# Patient Record
Sex: Female | Born: 1950 | Race: White | Hispanic: No | Marital: Married | Smoking: Never smoker
Health system: Southern US, Community
[De-identification: ages and names within clinical notes are randomized; demographics above are authoritative.]

## PROBLEM LIST (undated history)

## (undated) DIAGNOSIS — Z8719 Personal history of other diseases of the digestive system: Secondary | ICD-10-CM

## (undated) DIAGNOSIS — D649 Anemia, unspecified: Secondary | ICD-10-CM

## (undated) DIAGNOSIS — S82892A Other fracture of left lower leg, initial encounter for closed fracture: Secondary | ICD-10-CM

## (undated) DIAGNOSIS — E213 Hyperparathyroidism, unspecified: Secondary | ICD-10-CM

## (undated) DIAGNOSIS — E785 Hyperlipidemia, unspecified: Secondary | ICD-10-CM

## (undated) DIAGNOSIS — K5730 Diverticulosis of large intestine without perforation or abscess without bleeding: Secondary | ICD-10-CM

## (undated) DIAGNOSIS — I639 Cerebral infarction, unspecified: Secondary | ICD-10-CM

## (undated) DIAGNOSIS — M797 Fibromyalgia: Secondary | ICD-10-CM

## (undated) DIAGNOSIS — K219 Gastro-esophageal reflux disease without esophagitis: Secondary | ICD-10-CM

## (undated) DIAGNOSIS — I10 Essential (primary) hypertension: Secondary | ICD-10-CM

## (undated) HISTORY — DX: Hyperlipidemia, unspecified: E78.5

## (undated) HISTORY — PX: CHOLECYSTECTOMY: SHX55

## (undated) HISTORY — DX: Essential (primary) hypertension: I10

## (undated) HISTORY — PX: UPPER GASTROINTESTINAL ENDOSCOPY: SHX188

## (undated) HISTORY — DX: Other fracture of left lower leg, initial encounter for closed fracture: S82.892A

## (undated) HISTORY — PX: COLONOSCOPY: SHX174

## (undated) HISTORY — PX: ANKLE SURGERY: SHX546

## (undated) HISTORY — DX: Diverticulosis of large intestine without perforation or abscess without bleeding: K57.30

---

## 1999-03-30 ENCOUNTER — Other Ambulatory Visit: Admission: RE | Admit: 1999-03-30 | Discharge: 1999-03-30 | Payer: Self-pay | Admitting: Family Medicine

## 2000-09-08 ENCOUNTER — Other Ambulatory Visit: Admission: RE | Admit: 2000-09-08 | Discharge: 2000-09-08 | Payer: Self-pay | Admitting: Family Medicine

## 2001-09-14 ENCOUNTER — Other Ambulatory Visit: Admission: RE | Admit: 2001-09-14 | Discharge: 2001-09-14 | Payer: Self-pay | Admitting: Family Medicine

## 2003-10-22 ENCOUNTER — Other Ambulatory Visit: Admission: RE | Admit: 2003-10-22 | Discharge: 2003-10-22 | Payer: Self-pay | Admitting: Family Medicine

## 2004-01-05 DIAGNOSIS — S82892A Other fracture of left lower leg, initial encounter for closed fracture: Secondary | ICD-10-CM

## 2004-01-05 HISTORY — DX: Other fracture of left lower leg, initial encounter for closed fracture: S82.892A

## 2004-07-20 ENCOUNTER — Encounter: Admission: RE | Admit: 2004-07-20 | Discharge: 2004-10-18 | Payer: Self-pay | Admitting: Orthopedic Surgery

## 2005-01-21 ENCOUNTER — Other Ambulatory Visit: Admission: RE | Admit: 2005-01-21 | Discharge: 2005-01-21 | Payer: Self-pay | Admitting: Family Medicine

## 2006-02-17 ENCOUNTER — Other Ambulatory Visit: Admission: RE | Admit: 2006-02-17 | Discharge: 2006-02-17 | Payer: Self-pay | Admitting: Family Medicine

## 2010-10-22 ENCOUNTER — Encounter (INDEPENDENT_AMBULATORY_CARE_PROVIDER_SITE_OTHER): Payer: Self-pay | Admitting: *Deleted

## 2010-11-05 ENCOUNTER — Ambulatory Visit (INDEPENDENT_AMBULATORY_CARE_PROVIDER_SITE_OTHER): Payer: Self-pay | Admitting: Internal Medicine

## 2012-03-30 ENCOUNTER — Encounter: Payer: Self-pay | Admitting: *Deleted

## 2012-04-04 ENCOUNTER — Other Ambulatory Visit: Payer: Self-pay

## 2012-04-04 MED ORDER — OMEPRAZOLE 40 MG PO CPDR: 40.0000 mg | DELAYED_RELEASE_CAPSULE | Freq: Every day | ORAL | Status: DC

## 2012-04-04 MED ORDER — CYCLOBENZAPRINE HCL 10 MG PO TABS: 10.0000 mg | ORAL_TABLET | Freq: Every day | ORAL | Status: DC

## 2012-05-02 ENCOUNTER — Other Ambulatory Visit: Payer: Self-pay

## 2012-05-02 MED ORDER — TRAMADOL HCL 50 MG PO TABS: 50.0000 mg | ORAL_TABLET | Freq: Two times a day (BID) | ORAL | Status: DC

## 2012-05-02 NOTE — Telephone Encounter (Signed)
Last seen 02/25/12  Last written 02/11/12    Print Rx and have nurse call patient to pick up

## 2012-06-07 ENCOUNTER — Other Ambulatory Visit: Payer: Self-pay | Admitting: Nurse Practitioner

## 2012-06-08 NOTE — Telephone Encounter (Signed)
Last seen 02/14, last filled 05/02/12, call pt to pick up

## 2012-06-08 NOTE — Telephone Encounter (Signed)
according to epic was done on 06/06/12- please check with pharmacy

## 2012-06-09 NOTE — Telephone Encounter (Signed)
Called pharmacy and rx was picked up 06/08/12

## 2012-06-21 ENCOUNTER — Other Ambulatory Visit: Payer: Self-pay | Admitting: *Deleted

## 2012-06-21 ENCOUNTER — Other Ambulatory Visit: Payer: Self-pay | Admitting: Nurse Practitioner

## 2012-06-21 MED ORDER — HYOSCYAMINE SULFATE 0.125 MG SL SUBL: 0.1250 mg | SUBLINGUAL_TABLET | SUBLINGUAL | Status: DC | PRN

## 2012-06-21 MED ORDER — HYOSCYAMINE SULFATE 0.125 MG PO TABS: 0.1250 mg | ORAL_TABLET | ORAL | Status: DC | PRN

## 2012-06-21 NOTE — Telephone Encounter (Signed)
Has appt with you on 07/21/12, last seen 02/25/12 and filled then by [facility]

## 2012-06-23 ENCOUNTER — Other Ambulatory Visit: Payer: Self-pay

## 2012-06-23 MED ORDER — FLUTICASONE PROPIONATE 50 MCG/ACT NA SUSP: 2.0000 | Freq: Every day | NASAL | Status: DC

## 2012-06-26 ENCOUNTER — Ambulatory Visit: Payer: Self-pay | Admitting: Nurse Practitioner

## 2012-07-01 ENCOUNTER — Other Ambulatory Visit: Payer: Self-pay | Admitting: Family Medicine

## 2012-07-03 ENCOUNTER — Other Ambulatory Visit: Payer: Self-pay

## 2012-07-03 MED ORDER — HYDROCHLOROTHIAZIDE 25 MG PO TABS: 25.0000 mg | ORAL_TABLET | Freq: Every day | ORAL | Status: DC

## 2012-07-03 NOTE — Telephone Encounter (Signed)
LAST OV 2/14.

## 2012-07-03 NOTE — Telephone Encounter (Signed)
Last seen 2/14 [facility]

## 2012-07-20 ENCOUNTER — Other Ambulatory Visit: Payer: Self-pay

## 2012-07-20 MED ORDER — FLUTICASONE PROPIONATE 50 MCG/ACT NA SUSP: 2.0000 | Freq: Every day | NASAL | Status: DC

## 2012-07-20 MED ORDER — ENALAPRIL MALEATE 20 MG PO TABS: 20.0000 mg | ORAL_TABLET | Freq: Every day | ORAL | Status: DC

## 2012-07-20 MED ORDER — OMEPRAZOLE 40 MG PO CPDR: 40.0000 mg | DELAYED_RELEASE_CAPSULE | Freq: Every day | ORAL | Status: DC

## 2012-07-21 ENCOUNTER — Ambulatory Visit (INDEPENDENT_AMBULATORY_CARE_PROVIDER_SITE_OTHER): Payer: Medicare Other | Admitting: Nurse Practitioner

## 2012-07-21 ENCOUNTER — Encounter: Payer: Self-pay | Admitting: Nurse Practitioner

## 2012-07-21 VITALS — BP 150/74 | HR 76 | Temp 97.0°F | Wt 132.5 lb

## 2012-07-21 DIAGNOSIS — Z09 Encounter for follow-up examination after completed treatment for conditions other than malignant neoplasm: Secondary | ICD-10-CM

## 2012-07-21 DIAGNOSIS — K589 Irritable bowel syndrome without diarrhea: Secondary | ICD-10-CM

## 2012-07-21 DIAGNOSIS — I10 Essential (primary) hypertension: Secondary | ICD-10-CM | POA: Insufficient documentation

## 2012-07-21 DIAGNOSIS — K219 Gastro-esophageal reflux disease without esophagitis: Secondary | ICD-10-CM

## 2012-07-21 DIAGNOSIS — F411 Generalized anxiety disorder: Secondary | ICD-10-CM | POA: Insufficient documentation

## 2012-07-21 DIAGNOSIS — E785 Hyperlipidemia, unspecified: Secondary | ICD-10-CM

## 2012-07-21 DIAGNOSIS — M5137 Other intervertebral disc degeneration, lumbosacral region: Secondary | ICD-10-CM

## 2012-07-21 DIAGNOSIS — J309 Allergic rhinitis, unspecified: Secondary | ICD-10-CM

## 2012-07-21 DIAGNOSIS — M5136 Other intervertebral disc degeneration, lumbar region: Secondary | ICD-10-CM | POA: Insufficient documentation

## 2012-07-21 LAB — COMPLETE METABOLIC PANEL WITH GFR
ALT: 17 U/L (ref 0–35)
AST: 18 U/L (ref 0–37)
CO2: 31 mEq/L (ref 19–32)
Calcium: 10.7 mg/dL — ABNORMAL HIGH (ref 8.4–10.5)
Chloride: 102 mEq/L (ref 96–112)
Creat: 0.74 mg/dL (ref 0.50–1.10)
GFR, Est African American: >89 mL/min
Sodium: 142 mEq/L (ref 135–145)
Total Protein: 7.1 g/dL (ref 6.0–8.3)

## 2012-07-21 MED ORDER — ALPRAZOLAM 0.5 MG PO TABS: 0.5000 mg | ORAL_TABLET | Freq: Every evening | ORAL | Status: DC | PRN

## 2012-07-21 MED ORDER — OMEPRAZOLE 40 MG PO CPDR
40.0000 mg | DELAYED_RELEASE_CAPSULE | Freq: Every day | ORAL | Status: DC
Start: 2012-07-21 — End: 2012-11-22

## 2012-07-21 MED ORDER — CYCLOBENZAPRINE HCL 10 MG PO TABS: 10.0000 mg | ORAL_TABLET | Freq: Three times a day (TID) | ORAL | Status: DC | PRN

## 2012-07-21 MED ORDER — HYOSCYAMINE SULFATE 0.125 MG SL SUBL: 0.1250 mg | SUBLINGUAL_TABLET | SUBLINGUAL | Status: DC | PRN

## 2012-07-21 MED ORDER — ENALAPRIL MALEATE 20 MG PO TABS: ORAL_TABLET | ORAL | Status: DC

## 2012-07-21 MED ORDER — FLUTICASONE PROPIONATE 50 MCG/ACT NA SUSP: 2.0000 | Freq: Every day | NASAL | Status: DC

## 2012-07-21 MED ORDER — TRAMADOL HCL 50 MG PO TABS: 50.0000 mg | ORAL_TABLET | Freq: Two times a day (BID) | ORAL | Status: DC | PRN

## 2012-07-21 NOTE — Patient Instructions (Addendum)

## 2012-07-21 NOTE — Progress Notes (Signed)
Subjective:    Patient ID: Maria Moss, female    DOB: June 21, 1950, 62 y.o.   MRN: [id number]  Hyperlipidemia This is a chronic problem. The current episode started more than 1 year ago. The problem is uncontrolled. Recent lipid tests were reviewed and are high. She has no history of diabetes. Associated symptoms include myalgias. Pertinent negatives include no chest pain or shortness of breath. (Hx of myalgias with Crestor. Not taking any meds for hyperlipidemia at this time) Current antihyperlipidemic treatment includes diet change. The current treatment provides no improvement of lipids. Compliance problems include medication side effects.  Risk factors for coronary artery disease include dyslipidemia, hypertension and post-menopausal.  Hypertension This is a chronic problem. The current episode started more than 1 year ago. The problem has been waxing and waning since onset. The problem is uncontrolled. Associated symptoms include anxiety. Pertinent negatives include no chest pain, palpitations, peripheral edema or shortness of breath. Risk factors for coronary artery disease include dyslipidemia and post-menopausal state. Past treatments include ACE inhibitors and diuretics. The current treatment provides mild improvement. There is no history of a thyroid problem.  Anxiety Presents for follow-up visit. Symptoms include nervous/anxious behavior. Patient reports no chest pain, palpitations or shortness of breath. Symptoms occur occasionally. The severity of symptoms is mild. The quality of sleep is fair. Nighttime awakenings: several.        Review of Systems  Respiratory: Negative for shortness of breath.   Cardiovascular: Negative for chest pain and palpitations.  Musculoskeletal: Positive for myalgias and back pain.  Psychiatric/Behavioral: The patient is nervous/anxious.   All other systems reviewed and are negative.       Objective:   Physical Exam  Vitals reviewed. Constitutional:  She is oriented to person, place, and time. She appears well-developed and well-nourished.  HENT:  Head: Normocephalic.  Right Ear: External ear normal.  Left Ear: External ear normal.  Neck: Normal range of motion. Neck supple.  Cardiovascular: Normal rate, regular rhythm, normal heart sounds and intact distal pulses.   Pulmonary/Chest: Effort normal and breath sounds normal.  Abdominal: Soft. Bowel sounds are normal. She exhibits no distension. There is no tenderness.  Musculoskeletal: Normal range of motion. She exhibits no edema.  Neurological: She is alert and oriented to person, place, and time.  Skin: Skin is warm and dry.  Psychiatric: She has a normal mood and affect. Her behavior is normal. Judgment and thought content normal.    BP 158/83  Pulse 76  Temp(Src) 97 F (36.1 C) (Oral)  Wt 132 lb 8 oz (60.102 kg)       Assessment & Plan:   2. Degenerative disc disease, lumbar _Moist heat if it helps - traMADol (ULTRAM) 50 MG tablet; Take 1 tablet (50 mg total) by mouth 2 (two) times daily as needed for pain.  Dispense: 60 tablet; Refill: 0 - cyclobenzaprine (FLEXERIL) 10 MG tablet; Take 1 tablet (10 mg total) by mouth 3 (three) times daily as needed for muscle spasms.  Dispense: 30 tablet; Refill: 2  3. Hyperlipemia Low fat diet  - NMR Lipoprofile with Lipids  4. GAD (generalized anxiety disorder) Stress Managment - ALPRAZolam (XANAX) 0.5 MG tablet; Take 1 tablet (0.5 mg total) by mouth at bedtime as needed for sleep.  Dispense: 30 tablet; Refill: 1  5. Hypertension Low fat diet Check BP at home - enalapril (VASOTEC) 20 MG tablet; 1 1/2 po qd  Dispense: 135 tablet; Refill: 1 - COMPLETE METABOLIC PANEL WITH GFR  6. IBS (  irritable bowel syndrome) - hyoscyamine (LEVSIN SL) 0.125 MG SL tablet; Place 1 tablet (0.125 mg total) under the tongue every 4 (four) hours as needed for cramping.  Dispense: 30 tablet; Refill: 5  7. GERD (gastroesophageal reflux disease) Avoid  Spicy foods - omeprazole (PRILOSEC) 40 MG capsule; Take 1 capsule (40 mg total) by mouth daily.  Dispense: 90 capsule; Refill: 1  8. Allergic rhinitis - fluticasone (FLONASE) 50 MCG/ACT nasal spray; Place 2 sprays into the nose daily.  Dispense: 48 g; Refill: 1  Maria Daphine Deutscher, FNP

## 2012-07-25 LAB — NMR LIPOPROFILE WITH LIPIDS
Cholesterol, Total: 236 mg/dL — ABNORMAL HIGH (ref <200)
HDL Particle Number: 40.1 umol/L (ref >=30.5)
HDL-C: 68 mg/dL (ref >=40)
LDL (calc): 134 mg/dL — ABNORMAL HIGH (ref <100)
LDL Size: 21.1 nm (ref >20.5)
LP-IR Score: 25 (ref <=45)
Large HDL-P: 5.7 umol/L (ref >=4.8)
Small LDL Particle Number: 614 nmol/L — ABNORMAL HIGH (ref <=527)

## 2012-07-27 ENCOUNTER — Telehealth: Payer: Self-pay | Admitting: Nurse Practitioner

## 2012-07-27 NOTE — Telephone Encounter (Signed)
Script needed.

## 2012-07-30 NOTE — Telephone Encounter (Signed)
Willing to try Lipitor or zocor?

## 2012-07-31 MED ORDER — SIMVASTATIN 40 MG PO TABS: 40.0000 mg | ORAL_TABLET | Freq: Every day | ORAL | Status: DC

## 2012-07-31 NOTE — Telephone Encounter (Signed)
zocor rx sent to pharmacy

## 2012-07-31 NOTE — Telephone Encounter (Signed)
Wants to try zocor

## 2012-08-02 ENCOUNTER — Telehealth: Payer: Self-pay | Admitting: Nurse Practitioner

## 2012-08-02 MED ORDER — SIMVASTATIN 20 MG PO TABS: 20.0000 mg | ORAL_TABLET | Freq: Every day | ORAL | Status: DC

## 2012-08-02 NOTE — Telephone Encounter (Signed)
rx for zocor 20 mg sent to pharmacy

## 2012-08-02 NOTE — Telephone Encounter (Signed)
Pt aware of rx

## 2012-08-02 NOTE — Telephone Encounter (Signed)
Pt willing to try 20 mg of simvastatin  first due to having joint aches in the past. She doesn't want to 40mg  yet due to having trouble in past and is going to cut in 1/2 and see how she does.

## 2012-08-27 ENCOUNTER — Other Ambulatory Visit: Payer: Self-pay | Admitting: Nurse Practitioner

## 2012-08-30 ENCOUNTER — Other Ambulatory Visit: Payer: Self-pay | Admitting: Nurse Practitioner

## 2012-08-31 ENCOUNTER — Telehealth: Payer: Self-pay | Admitting: Nurse Practitioner

## 2012-08-31 NOTE — Telephone Encounter (Signed)
Last seen and filled 07/21/12  If approved print and route to nurse

## 2012-08-31 NOTE — Telephone Encounter (Signed)
rx ready for pickup 

## 2012-09-15 ENCOUNTER — Telehealth: Payer: Self-pay | Admitting: Nurse Practitioner

## 2012-09-22 ENCOUNTER — Other Ambulatory Visit: Payer: Self-pay | Admitting: Nurse Practitioner

## 2012-10-01 ENCOUNTER — Other Ambulatory Visit: Payer: Self-pay | Admitting: Nurse Practitioner

## 2012-10-04 ENCOUNTER — Other Ambulatory Visit: Payer: Self-pay | Admitting: Nurse Practitioner

## 2012-10-05 NOTE — Telephone Encounter (Signed)
Patient last seen in office on 07-21-12 by MMM. Tramadol last filled on 09-01-12. Xanax last filled on 10-02-12. Too early for xanax. Tried to refuse but it would not allow me because the tramadol rx was attached to it. Please advise on tramadol. If approved please print and route to Pool B so nurse can call pt to pick up

## 2012-10-05 NOTE — Telephone Encounter (Signed)
Tramadol rx ready for pick up  

## 2012-10-06 NOTE — Telephone Encounter (Signed)
Rx up front. Patient notified 

## 2012-11-03 ENCOUNTER — Ambulatory Visit (INDEPENDENT_AMBULATORY_CARE_PROVIDER_SITE_OTHER): Payer: Medicare Other | Admitting: *Deleted

## 2012-11-03 DIAGNOSIS — Z23 Encounter for immunization: Secondary | ICD-10-CM

## 2012-11-17 ENCOUNTER — Telehealth: Payer: Self-pay | Admitting: Nurse Practitioner

## 2012-11-17 DIAGNOSIS — F411 Generalized anxiety disorder: Secondary | ICD-10-CM

## 2012-11-17 MED ORDER — ALPRAZOLAM 0.5 MG PO TABS: 0.5000 mg | ORAL_TABLET | Freq: Every evening | ORAL | Status: DC | PRN

## 2012-11-17 MED ORDER — TRAMADOL HCL 50 MG PO TABS: 50.0000 mg | ORAL_TABLET | Freq: Two times a day (BID) | ORAL | Status: DC

## 2012-11-17 NOTE — Telephone Encounter (Signed)
rx ready for pickup 

## 2012-11-18 NOTE — Telephone Encounter (Signed)
Patient aware rx ready to be picked up 

## 2012-11-22 ENCOUNTER — Ambulatory Visit (INDEPENDENT_AMBULATORY_CARE_PROVIDER_SITE_OTHER): Payer: Medicare Other | Admitting: Nurse Practitioner

## 2012-11-22 ENCOUNTER — Encounter: Payer: Self-pay | Admitting: Nurse Practitioner

## 2012-11-22 VITALS — BP 143/89 | HR 95 | Temp 97.4°F | Ht 62.0 in | Wt 130.0 lb

## 2012-11-22 DIAGNOSIS — E785 Hyperlipidemia, unspecified: Secondary | ICD-10-CM

## 2012-11-22 DIAGNOSIS — M5136 Other intervertebral disc degeneration, lumbar region: Secondary | ICD-10-CM

## 2012-11-22 DIAGNOSIS — F411 Generalized anxiety disorder: Secondary | ICD-10-CM

## 2012-11-22 DIAGNOSIS — I10 Essential (primary) hypertension: Secondary | ICD-10-CM

## 2012-11-22 DIAGNOSIS — K219 Gastro-esophageal reflux disease without esophagitis: Secondary | ICD-10-CM

## 2012-11-22 DIAGNOSIS — J309 Allergic rhinitis, unspecified: Secondary | ICD-10-CM

## 2012-11-22 DIAGNOSIS — M5137 Other intervertebral disc degeneration, lumbosacral region: Secondary | ICD-10-CM

## 2012-11-22 MED ORDER — HYDROCHLOROTHIAZIDE 25 MG PO TABS: 25.0000 mg | ORAL_TABLET | Freq: Every day | ORAL | Status: DC

## 2012-11-22 MED ORDER — ALPRAZOLAM 0.5 MG PO TABS: 0.5000 mg | ORAL_TABLET | Freq: Every evening | ORAL | Status: DC | PRN

## 2012-11-22 MED ORDER — TRAMADOL HCL 50 MG PO TABS: 50.0000 mg | ORAL_TABLET | Freq: Two times a day (BID) | ORAL | Status: DC

## 2012-11-22 MED ORDER — ENALAPRIL MALEATE 20 MG PO TABS
ORAL_TABLET | ORAL | Status: DC
Start: 2012-11-22 — End: 2013-08-20

## 2012-11-22 MED ORDER — SIMVASTATIN 20 MG PO TABS: 20.0000 mg | ORAL_TABLET | Freq: Every day | ORAL | Status: DC

## 2012-11-22 MED ORDER — FLUTICASONE PROPIONATE 50 MCG/ACT NA SUSP: 2.0000 | Freq: Every day | NASAL | Status: DC

## 2012-11-22 MED ORDER — OMEPRAZOLE 40 MG PO CPDR: 40.0000 mg | DELAYED_RELEASE_CAPSULE | Freq: Every day | ORAL | Status: DC

## 2012-11-22 MED ORDER — CYCLOBENZAPRINE HCL 10 MG PO TABS: 10.0000 mg | ORAL_TABLET | Freq: Three times a day (TID) | ORAL | Status: DC | PRN

## 2012-11-22 NOTE — Progress Notes (Signed)
Subjective:    Patient ID: Maria Moss, female    DOB: Feb 13, 1950, 62 y.o.   MRN: [id number]  Hyperlipidemia This is a chronic problem. The current episode started more than 1 year ago. The problem is uncontrolled. Recent lipid tests were reviewed and are high. She has no history of diabetes. Associated symptoms include myalgias. Pertinent negatives include no chest pain or shortness of breath. (Hx of myalgias with Crestor. Not taking any meds for hyperlipidemia at this time) Current antihyperlipidemic treatment includes diet change. The current treatment provides no improvement of lipids. Compliance problems include medication side effects.  Risk factors for coronary artery disease include dyslipidemia, hypertension and post-menopausal.  Hypertension This is a chronic problem. The current episode started more than 1 year ago. The problem has been waxing and waning since onset. The problem is uncontrolled. Associated symptoms include anxiety. Pertinent negatives include no chest pain, palpitations, peripheral edema or shortness of breath. Risk factors for coronary artery disease include dyslipidemia and post-menopausal state. Past treatments include ACE inhibitors and diuretics. The current treatment provides mild improvement. There is no history of a thyroid problem.  Anxiety Presents for follow-up visit. Symptoms include nervous/anxious behavior. Patient reports no chest pain, palpitations or shortness of breath. Symptoms occur occasionally. The severity of symptoms is mild. The quality of sleep is fair. Nighttime awakenings: several.    IBS Use levsin when needed for cramping- uses 1-2 X a week. GERD Omeprazole daily- keeps symptoms under control DDD Was diagnosed by S. Weeks and has been taking ultram for- she says that the pain is worsening in her lower back. Pain radiates down her left leg- no weakness that she has noted.    Review of Systems  Respiratory: Negative for shortness of breath.    Cardiovascular: Negative for chest pain and palpitations.  Musculoskeletal: Positive for back pain and myalgias.  Psychiatric/Behavioral: The patient is nervous/anxious.   All other systems reviewed and are negative.       Objective:   Physical Exam  Vitals reviewed. Constitutional: She is oriented to person, place, and time. She appears well-developed and well-nourished.  HENT:  Head: Normocephalic.  Right Ear: External ear normal.  Left Ear: External ear normal.  Neck: Normal range of motion. Neck supple.  Cardiovascular: Normal rate, regular rhythm, normal heart sounds and intact distal pulses.   Pulmonary/Chest: Effort normal and breath sounds normal.  Abdominal: Soft. Bowel sounds are normal. She exhibits no distension. There is no tenderness.  Musculoskeletal: Normal range of motion. She exhibits no edema.  Neurological: She is alert and oriented to person, place, and time.  Skin: Skin is warm and dry.  Psychiatric: She has a normal mood and affect. Her behavior is normal. Judgment and thought content normal.    BP 143/89  Pulse 95  Temp(Src) 97.4 F (36.3 C) (Oral)  Ht 5\' 2"  (1.575 m)  Wt 130 lb (58.968 kg)  BMI 23.77 kg/m2       Assessment & Plan:   1. Hypertension   2. GERD (gastroesophageal reflux disease)   3. Allergic rhinitis   4. Degenerative disc disease, lumbar   5. GAD (generalized anxiety disorder)   6. Hyperlipidemia LDL goal < 100    Orders Placed This Encounter  Procedures  . CMP14+EGFR  . NMR, lipoprofile   Meds ordered this encounter  Medications  . hydrochlorothiazide (HYDRODIURIL) 25 MG tablet    Sig: Take 1 tablet (25 mg total) by mouth daily.    Dispense:  90 tablet  Refill:  1    Order Specific Question:  Supervising Provider    Answer:  Ernestina Penna [[id number]]  . enalapril (VASOTEC) 20 MG tablet    Sig: 1 1/2 po qd    Dispense:  135 tablet    Refill:  1    Order Specific Question:  Supervising Provider    Answer:  Ernestina Penna [[id number]]  . traMADol (ULTRAM) 50 MG tablet    Sig: Take 1 tablet (50 mg total) by mouth 2 (two) times daily.    Dispense:  60 tablet    Refill:  0    Do not fill till 12/18/12    Order Specific Question:  Supervising Provider    Answer:  Ernestina Penna [[id number]]  . omeprazole (PRILOSEC) 40 MG capsule    Sig: Take 1 capsule (40 mg total) by mouth daily.    Dispense:  90 capsule    Refill:  1    Order Specific Question:  Supervising Provider    Answer:  Ernestina Penna [[id number]]  . simvastatin (ZOCOR) 20 MG tablet    Sig: Take 1 tablet (20 mg total) by mouth at bedtime.    Dispense:  90 tablet    Refill:  1    Order Specific Question:  Supervising Provider    Answer:  Ernestina Penna [[id number]]  . fluticasone (FLONASE) 50 MCG/ACT nasal spray    Sig: Place 2 sprays into both nostrils daily.    Dispense:  48 g    Refill:  1    Order Specific Question:  Supervising Provider    Answer:  Ernestina Penna [[id number]]  . cyclobenzaprine (FLEXERIL) 10 MG tablet    Sig: Take 1 tablet (10 mg total) by mouth 3 (three) times daily as needed for muscle spasms.    Dispense:  30 tablet    Refill:  2    Order Specific Question:  Supervising Provider    Answer:  Ernestina Penna [[id number]]  . ALPRAZolam (XANAX) 0.5 MG tablet    Sig: Take 1 tablet (0.5 mg total) by mouth at bedtime as needed for sleep.    Dispense:  30 tablet    Refill:  1    Do not fill until 12/18/12    Order Specific Question:  Supervising Provider    Answer:  Ernestina Penna [[id number]]    Continue all meds Labs pending Diet and exercise encouraged Health maintenance reviewed Follow up in 3 months  Lorenia-Margaret Daphine Deutscher, FNP

## 2012-11-22 NOTE — Patient Instructions (Signed)

## 2012-11-24 LAB — CMP14+EGFR
ALT: 36 IU/L — ABNORMAL HIGH (ref 0–32)
AST: 26 IU/L (ref 0–40)
Alkaline Phosphatase: 109 IU/L (ref 39–117)
BUN/Creatinine Ratio: 23 (ref 11–26)
CO2: 27 mmol/L (ref 18–29)
Calcium: 10.6 mg/dL — ABNORMAL HIGH (ref 8.6–10.2)
Chloride: 99 mmol/L (ref 97–108)
Creatinine, Ser: 0.79 mg/dL (ref 0.57–1.00)
GFR calc Af Amer: 94 mL/min/{1.73_m2} (ref >59)
Globulin, Total: 2.3 g/dL (ref 1.5–4.5)
Sodium: 141 mmol/L (ref 134–144)
Total Bilirubin: 0.7 mg/dL (ref 0.0–1.2)

## 2012-11-24 LAB — NMR, LIPOPROFILE
Cholesterol: 181 mg/dL (ref <200)
HDL Particle Number: 49.6 umol/L (ref >=30.5)
LDL Particle Number: 1046 nmol/L — ABNORMAL HIGH (ref <1000)
LDL Size: 21.4 nm (ref >20.5)
LDLC SERPL CALC-MCNC: 69 mg/dL (ref <100)
LP-IR Score: 29 (ref <=45)

## 2013-01-19 ENCOUNTER — Encounter: Payer: Self-pay | Admitting: Nurse Practitioner

## 2013-01-19 ENCOUNTER — Ambulatory Visit (INDEPENDENT_AMBULATORY_CARE_PROVIDER_SITE_OTHER): Payer: Medicare HMO | Admitting: Nurse Practitioner

## 2013-01-19 VITALS — BP 154/89 | HR 98 | Temp 98.4°F | Ht 62.0 in | Wt 130.0 lb

## 2013-01-19 DIAGNOSIS — M5136 Other intervertebral disc degeneration, lumbar region: Secondary | ICD-10-CM

## 2013-01-19 DIAGNOSIS — R109 Unspecified abdominal pain: Secondary | ICD-10-CM

## 2013-01-19 DIAGNOSIS — M5137 Other intervertebral disc degeneration, lumbosacral region: Secondary | ICD-10-CM

## 2013-01-19 DIAGNOSIS — J329 Chronic sinusitis, unspecified: Secondary | ICD-10-CM

## 2013-01-19 DIAGNOSIS — F411 Generalized anxiety disorder: Secondary | ICD-10-CM

## 2013-01-19 MED ORDER — AZITHROMYCIN 250 MG PO TABS: ORAL_TABLET | ORAL | Status: DC

## 2013-01-19 MED ORDER — TRAMADOL HCL 50 MG PO TABS: 50.0000 mg | ORAL_TABLET | Freq: Two times a day (BID) | ORAL | Status: DC

## 2013-01-19 MED ORDER — ALPRAZOLAM 0.5 MG PO TABS: 0.5000 mg | ORAL_TABLET | Freq: Every evening | ORAL | Status: DC | PRN

## 2013-01-19 NOTE — Patient Instructions (Signed)

## 2013-01-19 NOTE — Progress Notes (Signed)
Subjective:    Patient ID: Maria Moss, female    DOB: August 23, 1950, 63 y.o.   MRN: [id number]  HPI  Patient nin with 2 complaints: 1. Patient has been having trouble with her stomach- says that it starts hurting then she breaks out in a sweat- goes away once she has a bowel movement- eventually turns into diarrhea and that is when pain eases off.- Has appointment with GI November 9th, 2015, but needs referral. 2. Also c/o sinus pain since Christmas- has used lots of OTC meds and alkelstzer plus is the only thing that works.    Review of Systems  Constitutional: Negative for fever, chills and appetite change.  HENT: Positive for congestion, postnasal drip, rhinorrhea and sinus pressure. Negative for sore throat and trouble swallowing.   Respiratory: Negative for cough and shortness of breath.   Cardiovascular: Negative.   All other systems reviewed and are negative.       Objective:   Physical Exam  Constitutional: She is oriented to person, place, and time. She appears well-developed and well-nourished.  HENT:  Right Ear: Hearing, tympanic membrane, external ear and ear canal normal.  Left Ear: Hearing, tympanic membrane, external ear and ear canal normal.  Nose: Mucosal edema and rhinorrhea present. Right sinus exhibits maxillary sinus tenderness and frontal sinus tenderness. Left sinus exhibits maxillary sinus tenderness and frontal sinus tenderness.  Mouth/Throat: Uvula is midline, oropharynx is clear and moist and mucous membranes are normal.  Eyes: EOM are normal. Pupils are equal, round, and reactive to light.  Neck: Normal range of motion. Neck supple.  Cardiovascular: Normal rate, regular rhythm, normal heart sounds and intact distal pulses.   Pulmonary/Chest: Effort normal and breath sounds normal.  Abdominal: Soft. Bowel sounds are normal. She exhibits no distension. There is no tenderness. There is no rebound.  Lymphadenopathy:    She has no cervical adenopathy.    Neurological: She is alert and oriented to person, place, and time.  Skin: Skin is warm and dry.  Psychiatric: She has a normal mood and affect. Her behavior is normal. Judgment and thought content normal.   BP 154/89  Pulse 98  Temp(Src) 98.4 F (36.9 C) (Oral)  Ht 5\' 2"  (1.575 m)  Wt 130 lb (58.968 kg)  BMI 23.77 kg/m2        Assessment & Plan:   1. Sinusitis, chronic   2. Abdominal pain, unspecified site    Meds ordered this encounter  Medications  . Probiotic Product (PROBIOTIC & ACIDOPHILUS EX ST PO)    Sig: Take by mouth.  Marland Kitchen azithromycin (ZITHROMAX Z-PAK) 250 MG tablet    Sig: As directed    Dispense:  6 each    Refill:  0    Order Specific Question:  Supervising Provider    Answer:  Chipper Herb [[id number]]   Orders Placed This Encounter  Procedures  . Ambulatory referral to Gastroenterology    Referral Priority:  Routine    Referral Type:  Consultation    Referral Reason:  Specialty Services Required    Requested Specialty:  Gastroenterology    Number of Visits Requested:  1   1. Take meds as prescribed 2. Use a cool mist humidifier especially during the winter months and when heat has been humid. 3. Use saline nose sprays frequently 4. Saline irrigations of the nose can be very helpful if done frequently.  * 4X daily for 1 week*  * Use of a nettie pot can be helpful with  this. Follow directions with this* 5. Drink plenty of fluids 6. Keep thermostat turn down low 7.For any cough or congestion  Use plain Mucinex- regular strength or max strength is fine   * Children- consult with Pharmacist for dosing 8. For fever or aces or pains- take tylenol or ibuprofen appropriate for age and weight.  * for fevers greater than 101 orally you may alternate ibuprofen and tylenol every  3 hours.    Keep follow up with GI RTO prn  Oliva-Margaret Hassell Done, FNP

## 2013-01-19 NOTE — Addendum Note (Signed)
Addended by: Chevis Pretty on: 01/19/2013 03:33 PM   Modules accepted: Orders

## 2013-01-29 ENCOUNTER — Other Ambulatory Visit: Payer: Self-pay | Admitting: Nurse Practitioner

## 2013-02-12 DIAGNOSIS — K5909 Other constipation: Secondary | ICD-10-CM | POA: Insufficient documentation

## 2013-02-12 DIAGNOSIS — K529 Noninfective gastroenteritis and colitis, unspecified: Secondary | ICD-10-CM | POA: Insufficient documentation

## 2013-02-18 ENCOUNTER — Other Ambulatory Visit: Payer: Self-pay | Admitting: Nurse Practitioner

## 2013-02-26 ENCOUNTER — Ambulatory Visit: Payer: Medicare Other | Admitting: Nurse Practitioner

## 2013-02-26 ENCOUNTER — Telehealth: Payer: Self-pay | Admitting: Nurse Practitioner

## 2013-02-26 DIAGNOSIS — M5136 Other intervertebral disc degeneration, lumbar region: Secondary | ICD-10-CM

## 2013-02-26 MED ORDER — TRAMADOL HCL 50 MG PO TABS: 50.0000 mg | ORAL_TABLET | Freq: Two times a day (BID) | ORAL | Status: DC

## 2013-02-26 NOTE — Telephone Encounter (Signed)
Patient aware to pick up Rx  

## 2013-02-26 NOTE — Telephone Encounter (Signed)
rx ready for pickup 

## 2013-03-19 ENCOUNTER — Ambulatory Visit (INDEPENDENT_AMBULATORY_CARE_PROVIDER_SITE_OTHER): Payer: Medicare HMO | Admitting: Nurse Practitioner

## 2013-03-19 ENCOUNTER — Encounter: Payer: Self-pay | Admitting: Nurse Practitioner

## 2013-03-19 VITALS — BP 133/80 | HR 84 | Temp 98.3°F | Ht 61.0 in | Wt 134.0 lb

## 2013-03-19 DIAGNOSIS — R39198 Other difficulties with micturition: Secondary | ICD-10-CM

## 2013-03-19 DIAGNOSIS — E785 Hyperlipidemia, unspecified: Secondary | ICD-10-CM

## 2013-03-19 DIAGNOSIS — M5136 Other intervertebral disc degeneration, lumbar region: Secondary | ICD-10-CM

## 2013-03-19 DIAGNOSIS — M5137 Other intervertebral disc degeneration, lumbosacral region: Secondary | ICD-10-CM

## 2013-03-19 DIAGNOSIS — R3989 Other symptoms and signs involving the genitourinary system: Secondary | ICD-10-CM

## 2013-03-19 DIAGNOSIS — N390 Urinary tract infection, site not specified: Secondary | ICD-10-CM

## 2013-03-19 DIAGNOSIS — I10 Essential (primary) hypertension: Secondary | ICD-10-CM

## 2013-03-19 DIAGNOSIS — F411 Generalized anxiety disorder: Secondary | ICD-10-CM

## 2013-03-19 LAB — POCT UA - MICROSCOPIC ONLY
CRYSTALS, UR, HPF, POC: NEGATIVE
Casts, Ur, LPF, POC: NEGATIVE
Mucus, UA: NEGATIVE
YEAST UA: NEGATIVE

## 2013-03-19 LAB — POCT URINALYSIS DIPSTICK
Bilirubin, UA: NEGATIVE
Glucose, UA: NEGATIVE
Ketones, UA: NEGATIVE
Nitrite, UA: NEGATIVE
SPEC GRAV UA: 1.01
UROBILINOGEN UA: NEGATIVE
pH, UA: 6

## 2013-03-19 MED ORDER — TRAMADOL HCL 50 MG PO TABS: 50.0000 mg | ORAL_TABLET | Freq: Two times a day (BID) | ORAL | Status: DC

## 2013-03-19 MED ORDER — ALPRAZOLAM 0.5 MG PO TABS: 0.5000 mg | ORAL_TABLET | Freq: Every evening | ORAL | Status: DC | PRN

## 2013-03-19 MED ORDER — NITROFURANTOIN MONOHYD MACRO 100 MG PO CAPS: 100.0000 mg | ORAL_CAPSULE | Freq: Two times a day (BID) | ORAL | Status: DC

## 2013-03-19 NOTE — Progress Notes (Signed)
Subjective:    Patient ID: [name], female    DOB: January 31, 1950, 63 y.o.   MRN: [id number]  Patient in today for follow up of chronic medical problems- Doing well on all meds- HEr only complaint is pelvic pressure and urinary frequency that started a few days ago.  Hyperlipidemia This is a chronic problem. The current episode started more than 1 year ago. The problem is uncontrolled. Recent lipid tests were reviewed and are high. She has no history of diabetes. Associated symptoms include myalgias. Pertinent negatives include no chest pain or shortness of breath. (Hx of myalgias with Crestor. Not taking any meds for hyperlipidemia at this time) Current antihyperlipidemic treatment includes diet change. The current treatment provides no improvement of lipids. Compliance problems include medication side effects.  Risk factors for coronary artery disease include dyslipidemia, hypertension and post-menopausal.  Hypertension This is a chronic problem. The current episode started more than 1 year ago. The problem has been waxing and waning since onset. The problem is uncontrolled. Associated symptoms include anxiety. Pertinent negatives include no chest pain, palpitations, peripheral edema or shortness of breath. Risk factors for coronary artery disease include dyslipidemia and post-menopausal state. Past treatments include ACE inhibitors and diuretics. The current treatment provides mild improvement. There is no history of a thyroid problem.  Anxiety Presents for follow-up visit. Symptoms include nervous/anxious behavior. Patient reports no chest pain, palpitations or shortness of breath. Symptoms occur occasionally. The severity of symptoms is mild. The quality of sleep is fair. Nighttime awakenings: several.    IBS Use levsin when needed for cramping- uses 1-2 X a week. GERD Omeprazole daily- keeps symptoms under control DDD Was diagnosed by S. Weeks and has been taking ultram for- she says that the  pain is worsening in her lower back. Pain radiates down her left leg- no weakness that she has noted.    Review of Systems  Constitutional: Negative.   HENT: Negative.   Respiratory: Negative for shortness of breath.   Cardiovascular: Negative for chest pain and palpitations.  Endocrine: Negative.   Genitourinary: Positive for urgency, frequency and pelvic pain.  Musculoskeletal: Positive for back pain and myalgias.  Neurological: Negative.   Hematological: Negative.   Psychiatric/Behavioral: The patient is nervous/anxious.   All other systems reviewed and are negative.       Objective:   Physical Exam  Vitals reviewed. Constitutional: She is oriented to person, place, and time. She appears well-developed and well-nourished.  HENT:  Head: Normocephalic.  Right Ear: External ear normal.  Left Ear: External ear normal.  Neck: Normal range of motion. Neck supple.  Cardiovascular: Normal rate, regular rhythm, normal heart sounds and intact distal pulses.   Pulmonary/Chest: Effort normal and breath sounds normal.  Abdominal: Soft. Bowel sounds are normal. She exhibits no distension. There is no tenderness.  Musculoskeletal: Normal range of motion. She exhibits no edema.  Neurological: She is alert and oriented to person, place, and time.  Skin: Skin is warm and dry.  Psychiatric: She has a normal mood and affect. Her behavior is normal. Judgment and thought content normal.    BP 133/80  Pulse 84  Temp(Src) 98.3 F (36.8 C) (Oral)  Ht 5' 1"  (1.549 m)  Wt 134 lb (60.782 kg)  BMI 25.33 kg/m2       Assessment & Plan:    1. Voiding difficulty   2. Hypertension   3. Hyperlipemia   4. GAD (generalized anxiety disorder)   5. Degenerative disc disease, lumbar  6. UTI (urinary tract infection)    Orders Placed This Encounter  Procedures  . CMP14+EGFR  . NMR, lipoprofile  . POCT urinalysis dipstick  . POCT UA - Microscopic Only   Meds ordered this encounter   Medications  . ALPRAZolam (XANAX) 0.5 MG tablet    Sig: Take 1 tablet (0.5 mg total) by mouth at bedtime as needed for sleep.    Dispense:  30 tablet    Refill:  1    Order Specific Question:  Supervising Provider    Answer:  Chipper Herb [[id number]]  . traMADol (ULTRAM) 50 MG tablet    Sig: Take 1 tablet (50 mg total) by mouth 2 (two) times daily.    Dispense:  60 tablet    Refill:  0    Do not fill till 03/31/13    Order Specific Question:  Supervising Provider    Answer:  Chipper Herb [[id number]]  . nitrofurantoin, macrocrystal-monohydrate, (MACROBID) 100 MG capsule    Sig: Take 1 capsule (100 mg total) by mouth 2 (two) times daily.    Dispense:  14 capsule    Refill:  0    Order Specific Question:  Supervising Provider    Answer:  Joycelyn Man   Force fluids AZO over the counter X2 days RTO prn Culture pending Labs pending Health maintenance reviewed Diet and exercise encouraged Continue all meds Follow up  In 3 months   [place], FNP

## 2013-03-19 NOTE — Patient Instructions (Signed)
Urinary Tract Infection  Urinary tract infections (UTIs) can develop anywhere along your urinary tract. Your urinary tract is your body's drainage system for removing wastes and extra water. Your urinary tract includes two kidneys, two ureters, a bladder, and a urethra. Your kidneys are a pair of bean-shaped organs. Each kidney is about the size of your fist. They are located below your ribs, one on each side of your spine.  CAUSES  Infections are caused by microbes, which are microscopic organisms, including fungi, viruses, and bacteria. These organisms are so small that they can only be seen through a microscope. Bacteria are the microbes that most commonly cause UTIs.  SYMPTOMS   Symptoms of UTIs may vary by age and gender of the patient and by the location of the infection. Symptoms in young women typically include a frequent and intense urge to urinate and a painful, burning feeling in the bladder or urethra during urination. Older women and men are more likely to be tired, shaky, and weak and have muscle aches and abdominal pain. A fever may mean the infection is in your kidneys. Other symptoms of a kidney infection include pain in your back or sides below the ribs, nausea, and vomiting.  DIAGNOSIS  To diagnose a UTI, your caregiver will ask you about your symptoms. Your caregiver also will ask to provide a urine sample. The urine sample will be tested for bacteria and white blood cells. White blood cells are made by your body to help fight infection.  TREATMENT   Typically, UTIs can be treated with medication. Because most UTIs are caused by a bacterial infection, they usually can be treated with the use of antibiotics. The choice of antibiotic and length of treatment depend on your symptoms and the type of bacteria causing your infection.  HOME CARE INSTRUCTIONS   If you were prescribed antibiotics, take them exactly as your caregiver instructs you. Finish the medication even if you feel better after you  have only taken some of the medication.   Drink enough water and fluids to keep your urine clear or pale yellow.   Avoid caffeine, tea, and carbonated beverages. They tend to irritate your bladder.   Empty your bladder often. Avoid holding urine for long periods of time.   Empty your bladder before and after sexual intercourse.   After a bowel movement, women should cleanse from front to back. Use each tissue only once.  SEEK MEDICAL CARE IF:    You have back pain.   You develop a fever.   Your symptoms do not begin to resolve within 3 days.  SEEK IMMEDIATE MEDICAL CARE IF:    You have severe back pain or lower abdominal pain.   You develop chills.   You have nausea or vomiting.   You have continued burning or discomfort with urination.  MAKE SURE YOU:    Understand these instructions.   Will watch your condition.   Will get help right away if you are not doing well or get worse.  Document Released: 09/30/2004 Document Revised: 06/22/2011 Document Reviewed: 01/29/2011  [employer] Patient Information 2014 [employer]

## 2013-03-21 LAB — CMP14+EGFR
A/G RATIO: 2.3 (ref 1.1–2.5)
ALT: 19 IU/L (ref 0–32)
AST: 18 IU/L (ref 0–40)
Albumin: 4.5 g/dL (ref 3.6–4.8)
Alkaline Phosphatase: 101 IU/L (ref 39–117)
BUN/Creatinine Ratio: 19 (ref 11–26)
BUN: 13 mg/dL (ref 8–27)
CALCIUM: 10 mg/dL (ref 8.7–10.3)
CHLORIDE: 97 mmol/L (ref 97–108)
CO2: 26 mmol/L (ref 18–29)
Creatinine, Ser: 0.69 mg/dL (ref 0.57–1.00)
GFR calc Af Amer: 109 mL/min/{1.73_m2} (ref >59)
GFR calc non Af Amer: 94 mL/min/{1.73_m2} (ref >59)
Globulin, Total: 2 g/dL (ref 1.5–4.5)
Glucose: 93 mg/dL (ref 65–99)
POTASSIUM: 4.4 mmol/L (ref 3.5–5.2)
SODIUM: 138 mmol/L (ref 134–144)
TOTAL PROTEIN: 6.5 g/dL (ref 6.0–8.5)
Total Bilirubin: 0.5 mg/dL (ref 0.0–1.2)

## 2013-03-21 LAB — NMR, LIPOPROFILE
Cholesterol: 165 mg/dL (ref <200)
HDL Cholesterol by NMR: 79 mg/dL (ref >=40)
HDL Particle Number: 47 umol/L (ref >=30.5)
LDL PARTICLE NUMBER: 1078 nmol/L — AB (ref <1000)
LDL Size: 20.5 nm — ABNORMAL LOW (ref >20.5)
LDLC SERPL CALC-MCNC: 65 mg/dL (ref <100)
LP-IR Score: <25 (ref <=45)
SMALL LDL PARTICLE NUMBER: 558 nmol/L — AB (ref <=527)
TRIGLYCERIDES BY NMR: 107 mg/dL (ref <150)

## 2013-03-27 ENCOUNTER — Ambulatory Visit: Payer: Medicare HMO | Admitting: General Practice

## 2013-03-27 ENCOUNTER — Telehealth: Payer: Self-pay | Admitting: General Practice

## 2013-03-27 NOTE — Telephone Encounter (Signed)
Patient is unable to get out of bed due to being so sick. Patients husband advised to take her to the ER for evaluation due to her possibly needing fluids and other testing.

## 2013-05-10 ENCOUNTER — Telehealth: Payer: Self-pay | Admitting: Nurse Practitioner

## 2013-05-10 DIAGNOSIS — M5136 Other intervertebral disc degeneration, lumbar region: Secondary | ICD-10-CM

## 2013-05-10 MED ORDER — TRAMADOL HCL 50 MG PO TABS: 50.0000 mg | ORAL_TABLET | Freq: Two times a day (BID) | ORAL | Status: DC

## 2013-05-10 NOTE — Telephone Encounter (Signed)
rx ready for pickup 

## 2013-05-18 ENCOUNTER — Other Ambulatory Visit: Payer: Self-pay | Admitting: *Deleted

## 2013-05-18 DIAGNOSIS — K589 Irritable bowel syndrome without diarrhea: Secondary | ICD-10-CM

## 2013-05-18 MED ORDER — HYOSCYAMINE SULFATE 0.125 MG SL SUBL: 0.1250 mg | SUBLINGUAL_TABLET | SUBLINGUAL | Status: DC | PRN

## 2013-05-25 ENCOUNTER — Other Ambulatory Visit: Payer: Self-pay

## 2013-05-25 MED ORDER — CYCLOBENZAPRINE HCL 10 MG PO TABS: ORAL_TABLET | ORAL | Status: DC

## 2013-06-20 ENCOUNTER — Other Ambulatory Visit: Payer: Self-pay | Admitting: Nurse Practitioner

## 2013-06-21 ENCOUNTER — Other Ambulatory Visit: Payer: Self-pay | Admitting: Nurse Practitioner

## 2013-06-21 DIAGNOSIS — M5136 Other intervertebral disc degeneration, lumbar region: Secondary | ICD-10-CM

## 2013-06-21 MED ORDER — TRAMADOL HCL 50 MG PO TABS: 50.0000 mg | ORAL_TABLET | Freq: Two times a day (BID) | ORAL | Status: DC

## 2013-06-21 MED ORDER — ALPRAZOLAM 0.5 MG PO TABS: ORAL_TABLET | ORAL | Status: DC

## 2013-06-21 NOTE — Telephone Encounter (Signed)
Please call in xanax with 1 refills 

## 2013-06-21 NOTE — Telephone Encounter (Signed)
Called authorization into pharmacy.

## 2013-06-21 NOTE — Telephone Encounter (Signed)
Patient requesting Tramadol as well.

## 2013-06-22 ENCOUNTER — Telehealth: Payer: Self-pay | Admitting: *Deleted

## 2013-06-22 NOTE — Telephone Encounter (Signed)
Patient aware, script ready.

## 2013-06-22 NOTE — Progress Notes (Signed)
Patient aware.

## 2013-06-27 ENCOUNTER — Ambulatory Visit: Payer: Medicare HMO | Admitting: Nurse Practitioner

## 2013-07-19 ENCOUNTER — Ambulatory Visit (INDEPENDENT_AMBULATORY_CARE_PROVIDER_SITE_OTHER): Payer: Medicare HMO | Admitting: Nurse Practitioner

## 2013-07-19 ENCOUNTER — Encounter: Payer: Self-pay | Admitting: Nurse Practitioner

## 2013-07-19 VITALS — BP 138/92 | HR 72 | Temp 97.3°F | Ht 61.0 in | Wt 134.6 lb

## 2013-07-19 DIAGNOSIS — M5137 Other intervertebral disc degeneration, lumbosacral region: Secondary | ICD-10-CM

## 2013-07-19 DIAGNOSIS — Z13820 Encounter for screening for osteoporosis: Secondary | ICD-10-CM

## 2013-07-19 DIAGNOSIS — F411 Generalized anxiety disorder: Secondary | ICD-10-CM

## 2013-07-19 DIAGNOSIS — M5136 Other intervertebral disc degeneration, lumbar region: Secondary | ICD-10-CM

## 2013-07-19 DIAGNOSIS — I10 Essential (primary) hypertension: Secondary | ICD-10-CM

## 2013-07-19 DIAGNOSIS — E785 Hyperlipidemia, unspecified: Secondary | ICD-10-CM

## 2013-07-19 MED ORDER — TRAMADOL HCL 50 MG PO TABS: 50.0000 mg | ORAL_TABLET | Freq: Two times a day (BID) | ORAL | Status: DC

## 2013-07-19 NOTE — Progress Notes (Signed)
Subjective:    Patient ID: Maria Moss, female    DOB: October 01, 1950, 63 y.o.   MRN: [id number]  Patient in today for follow up of chronic medical problems- Doing well on all meds-   Hypertension This is a chronic problem. The current episode started more than 1 year ago. The problem has been waxing and waning since onset. The problem is uncontrolled. Associated symptoms include anxiety. Pertinent negatives include no chest pain, palpitations, peripheral edema or shortness of breath. Risk factors for coronary artery disease include dyslipidemia and post-menopausal state. Past treatments include ACE inhibitors and diuretics. The current treatment provides mild improvement. There is no history of a thyroid problem.  Hyperlipidemia This is a chronic problem. The current episode started more than 1 year ago. The problem is uncontrolled. Recent lipid tests were reviewed and are high. She has no history of diabetes. Pertinent negatives include no chest pain or shortness of breath. (Hx of myalgias with Crestor. Not taking any meds for hyperlipidemia at this time) Current antihyperlipidemic treatment includes diet change. The current treatment provides no improvement of lipids. Compliance problems include medication side effects.  Risk factors for coronary artery disease include dyslipidemia, hypertension and post-menopausal.  Anxiety Presents for follow-up visit. Patient reports no chest pain, palpitations or shortness of breath. Symptoms occur occasionally. The severity of symptoms is mild. The quality of sleep is fair. Nighttime awakenings: several.    IBS Use levsin when needed for cramping- uses PRN. She is seeing Dr. Carlton Adam.  GERD Omeprazole daily- keeps symptoms under control DDD Was diagnosed by S. Weeks and has been taking ultram for- she says that the pain is worsening in her lower back. Pain radiates down her left leg- no weakness that she has noted.    Review of Systems  Constitutional:  Negative.   HENT: Negative.   Respiratory: Negative for shortness of breath.   Cardiovascular: Negative for chest pain and palpitations.  Endocrine: Negative.   Musculoskeletal: Positive for back pain.  Neurological: Negative.   Hematological: Negative.   All other systems reviewed and are negative.      Objective:   Physical Exam  Vitals reviewed. Constitutional: She is oriented to person, place, and time. She appears well-developed and well-nourished.  HENT:  Head: Normocephalic.  Right Ear: External ear normal.  Left Ear: External ear normal.  Neck: Normal range of motion. Neck supple.  Cardiovascular: Normal rate, regular rhythm, normal heart sounds and intact distal pulses.   Pulmonary/Chest: Effort normal and breath sounds normal.  Abdominal: Soft. Bowel sounds are normal. She exhibits no distension. There is no tenderness.  Musculoskeletal: Normal range of motion. She exhibits no edema.  Neurological: She is alert and oriented to person, place, and time.  Skin: Skin is warm and dry.  Psychiatric: She has a normal mood and affect. Her behavior is normal. Judgment and thought content normal.    BP 138/92  Pulse 72  Temp(Src) 97.3 F (36.3 C) (Oral)  Ht 5' 1"  (1.549 m)  Wt 134 lb 9.6 oz (61.054 kg)  BMI 25.45 kg/m2       Assessment & Plan:   1. Essential hypertension   2. Hyperlipemia   3. GAD (generalized anxiety disorder)   4. Degenerative disc disease, lumbar   5. Screening for osteoporosis    Orders Placed This Encounter  Procedures  . DG Bone Density    Standing Status: Future     Number of Occurrences:      Standing Expiration Date: 09/18/2014  Order Specific Question:  Reason for Exam (SYMPTOM  OR DIAGNOSIS REQUIRED)    Answer:  screening    Order Specific Question:  Preferred imaging location?    Answer:  Internal  . NMR, lipoprofile  . CMP14+EGFR   Meds ordered this encounter  Medications  . traMADol (ULTRAM) 50 MG tablet    Sig: Take 1  tablet (50 mg total) by mouth 2 (two) times daily.    Dispense:  60 tablet    Refill:  0    Do not fill till 07/22/13    Order Specific Question:  Supervising Provider    Answer:  Chipper Herb [[id number]]   Patient to schedule mammo and pap Labs pending Health maintenance reviewed Diet and exercise encouraged Continue all meds Follow up  In 3 months   [place], FNP

## 2013-07-19 NOTE — Patient Instructions (Signed)

## 2013-07-20 LAB — CMP14+EGFR
ALBUMIN: 4.7 g/dL (ref 3.6–4.8)
ALT: 25 IU/L (ref 0–32)
AST: 24 IU/L (ref 0–40)
Albumin/Globulin Ratio: 2 (ref 1.1–2.5)
Alkaline Phosphatase: 105 IU/L (ref 39–117)
BUN/Creatinine Ratio: 16 (ref 11–26)
BUN: 12 mg/dL (ref 8–27)
CALCIUM: 10.3 mg/dL (ref 8.7–10.3)
CHLORIDE: 98 mmol/L (ref 97–108)
CO2: 27 mmol/L (ref 18–29)
Creatinine, Ser: 0.76 mg/dL (ref 0.57–1.00)
GFR calc Af Amer: 98 mL/min/{1.73_m2} (ref >59)
GFR calc non Af Amer: 85 mL/min/{1.73_m2} (ref >59)
GLOBULIN, TOTAL: 2.3 g/dL (ref 1.5–4.5)
Glucose: 91 mg/dL (ref 65–99)
Potassium: 4.4 mmol/L (ref 3.5–5.2)
SODIUM: 141 mmol/L (ref 134–144)
Total Bilirubin: 0.6 mg/dL (ref 0.0–1.2)
Total Protein: 7 g/dL (ref 6.0–8.5)

## 2013-07-20 LAB — NMR, LIPOPROFILE
Cholesterol: 176 mg/dL (ref 100–199)
HDL Cholesterol by NMR: 78 mg/dL (ref >39)
HDL Particle Number: 45.3 umol/L (ref >=30.5)
LDL Particle Number: 783 nmol/L (ref <1000)
LDL SIZE: 20.7 nm (ref >20.5)
LDLC SERPL CALC-MCNC: 71 mg/dL (ref 0–99)
LP-IR SCORE: 33 (ref <=45)
SMALL LDL PARTICLE NUMBER: 384 nmol/L (ref <=527)
Triglycerides by NMR: 137 mg/dL (ref 0–149)

## 2013-07-30 ENCOUNTER — Ambulatory Visit: Payer: Medicare HMO

## 2013-07-30 DIAGNOSIS — Z09 Encounter for follow-up examination after completed treatment for conditions other than malignant neoplasm: Secondary | ICD-10-CM

## 2013-07-30 DIAGNOSIS — Z23 Encounter for immunization: Secondary | ICD-10-CM

## 2013-07-31 ENCOUNTER — Other Ambulatory Visit: Payer: Self-pay | Admitting: Nurse Practitioner

## 2013-08-20 ENCOUNTER — Other Ambulatory Visit: Payer: Self-pay | Admitting: Nurse Practitioner

## 2013-09-02 ENCOUNTER — Other Ambulatory Visit: Payer: Self-pay | Admitting: Nurse Practitioner

## 2013-09-07 ENCOUNTER — Telehealth: Payer: Self-pay | Admitting: Nurse Practitioner

## 2013-09-07 DIAGNOSIS — M5136 Other intervertebral disc degeneration, lumbar region: Secondary | ICD-10-CM

## 2013-09-07 MED ORDER — TRAMADOL HCL 50 MG PO TABS: 50.0000 mg | ORAL_TABLET | Freq: Two times a day (BID) | ORAL | Status: DC

## 2013-09-07 NOTE — Telephone Encounter (Signed)
Patient aware rx up front to be picked up

## 2013-09-07 NOTE — Telephone Encounter (Signed)
rx ready for pickup 

## 2013-09-12 ENCOUNTER — Other Ambulatory Visit: Payer: Self-pay | Admitting: *Deleted

## 2013-09-12 MED ORDER — ALPRAZOLAM 0.5 MG PO TABS: ORAL_TABLET | ORAL | Status: DC

## 2013-09-12 NOTE — Telephone Encounter (Signed)
Please print for mail order. Last ov 07/19/13.

## 2013-09-13 ENCOUNTER — Telehealth: Payer: Self-pay | Admitting: *Deleted

## 2013-09-13 NOTE — Telephone Encounter (Signed)
Rx for xanax ready. Pt aware.

## 2013-09-14 ENCOUNTER — Encounter: Payer: Self-pay | Admitting: Nurse Practitioner

## 2013-09-14 ENCOUNTER — Telehealth: Payer: Self-pay | Admitting: Nurse Practitioner

## 2013-09-14 ENCOUNTER — Ambulatory Visit (INDEPENDENT_AMBULATORY_CARE_PROVIDER_SITE_OTHER): Payer: Medicare HMO | Admitting: Nurse Practitioner

## 2013-09-14 VITALS — BP 152/97 | HR 104 | Temp 97.4°F | Ht 61.0 in | Wt 139.4 lb

## 2013-09-14 DIAGNOSIS — N3000 Acute cystitis without hematuria: Secondary | ICD-10-CM

## 2013-09-14 DIAGNOSIS — R300 Dysuria: Secondary | ICD-10-CM

## 2013-09-14 LAB — POCT URINALYSIS DIPSTICK
BILIRUBIN UA: NEGATIVE
Glucose, UA: NEGATIVE
KETONES UA: NEGATIVE
Nitrite, UA: NEGATIVE
PH UA: 5
Protein, UA: NEGATIVE
Spec Grav, UA: <=1.005
Urobilinogen, UA: NEGATIVE

## 2013-09-14 LAB — POCT UA - MICROSCOPIC ONLY
BACTERIA, U MICROSCOPIC: NEGATIVE
CASTS, UR, LPF, POC: NEGATIVE
CRYSTALS, UR, HPF, POC: NEGATIVE
MUCUS UA: NEGATIVE
Yeast, UA: NEGATIVE

## 2013-09-14 MED ORDER — NITROFURANTOIN MONOHYD MACRO 100 MG PO CAPS: 100.0000 mg | ORAL_CAPSULE | Freq: Two times a day (BID) | ORAL | Status: DC

## 2013-09-14 NOTE — Patient Instructions (Signed)

## 2013-09-14 NOTE — Telephone Encounter (Signed)
Appt given for today 

## 2013-09-14 NOTE — Progress Notes (Signed)
   Subjective:    Patient ID: Maria Moss, female    DOB: Mar 06, 1950, 63 y.o.   MRN: [id number]  HPI Patient started out yesterday with fever and nausea- then she had dysuira yesterday evening- some urgency. Nausea all day today and she took a zofran and that relieved her nausea.   Review of Systems  Constitutional: Positive for chills (and sweating). Negative for fever.  HENT: Negative.   Respiratory: Negative.   Cardiovascular: Negative.   Genitourinary: Positive for dysuria, urgency and frequency.  Neurological: Negative.   Psychiatric/Behavioral: Negative.   All other systems reviewed and are negative.      Objective:   Physical Exam  Constitutional: She is oriented to person, place, and time. She appears well-developed and well-nourished.  Cardiovascular: Normal rate and normal heart sounds.   Pulmonary/Chest: Effort normal and breath sounds normal.  Abdominal: Soft. Bowel sounds are normal. There is tenderness (mild suprapubic pain).  Genitourinary:  Left CVA tenderness  Neurological: She is alert and oriented to person, place, and time.  Skin: Skin is warm and dry.  Psychiatric: She has a normal mood and affect. Her behavior is normal. Judgment and thought content normal.   BP 152/97  Pulse 104  Temp(Src) 97.4 F (36.3 C) (Oral)  Ht 5\' 1"  (1.549 m)  Wt 139 lb 6.4 oz (63.231 kg)  BMI 26.35 kg/m2  Results for orders placed in visit on 09/14/13  POCT URINALYSIS DIPSTICK      Result Value Ref Range   Color, UA YELLOW     Clarity, UA CLOUDY     Glucose, UA NEG     Bilirubin, UA NEG     Ketones, UA NEG     Spec Grav, UA 1.020     Blood, UA TRACE     pH, UA 6.0     Protein, UA NEG     Urobilinogen, UA negative     Nitrite, UA NEG     Leukocytes, UA Trace    POCT UA - MICROSCOPIC ONLY      Result Value Ref Range   WBC, Ur, HPF, POC 10-15     RBC, urine, microscopic 1-5     Bacteria, U Microscopic NEG     Mucus, UA NEG     Epithelial cells, urine per micros OCC      Crystals, Ur, HPF, POC NEG     Casts, Ur, LPF, POC NEG     Yeast, UA NEG    '      Assessment & Plan:   1. Dysuria   2. Acute cystitis without hematuria    Meds ordered this encounter  Medications  . nitrofurantoin, macrocrystal-monohydrate, (MACROBID) 100 MG capsule    Sig: Take 1 capsule (100 mg total) by mouth 2 (two) times daily.    Dispense:  14 capsule    Refill:  0    Order Specific Question:  Supervising Provider    Answer:  Joycelyn Man   Force fluids AZO over the counter X2 days RTO prn Culture pending  Abena-Margaret Hassell Done, FNP

## 2013-09-27 ENCOUNTER — Other Ambulatory Visit: Payer: Self-pay | Admitting: Nurse Practitioner

## 2013-10-11 ENCOUNTER — Telehealth: Payer: Self-pay | Admitting: Nurse Practitioner

## 2013-10-11 DIAGNOSIS — M5136 Other intervertebral disc degeneration, lumbar region: Secondary | ICD-10-CM

## 2013-10-11 DIAGNOSIS — M51369 Other intervertebral disc degeneration, lumbar region without mention of lumbar back pain or lower extremity pain: Secondary | ICD-10-CM

## 2013-10-11 MED ORDER — TRAMADOL HCL 50 MG PO TABS: 50.0000 mg | ORAL_TABLET | Freq: Two times a day (BID) | ORAL | Status: DC

## 2013-10-11 NOTE — Telephone Encounter (Signed)
rx ready for pickup 

## 2013-10-11 NOTE — Telephone Encounter (Signed)
Aware ,script ready. 

## 2013-10-19 ENCOUNTER — Other Ambulatory Visit: Payer: Self-pay | Admitting: *Deleted

## 2013-10-19 MED ORDER — OMEPRAZOLE 40 MG PO CPDR: DELAYED_RELEASE_CAPSULE | ORAL | Status: DC

## 2013-10-19 MED ORDER — ENALAPRIL MALEATE 20 MG PO TABS: ORAL_TABLET | ORAL | Status: DC

## 2013-10-19 MED ORDER — SIMVASTATIN 20 MG PO TABS: ORAL_TABLET | ORAL | Status: DC

## 2013-10-19 MED ORDER — HYDROCHLOROTHIAZIDE 25 MG PO TABS: 25.0000 mg | ORAL_TABLET | Freq: Every day | ORAL | Status: DC

## 2013-10-24 ENCOUNTER — Ambulatory Visit (INDEPENDENT_AMBULATORY_CARE_PROVIDER_SITE_OTHER): Payer: Medicare HMO

## 2013-10-24 ENCOUNTER — Ambulatory Visit (INDEPENDENT_AMBULATORY_CARE_PROVIDER_SITE_OTHER): Payer: Medicare HMO | Admitting: Pharmacist

## 2013-10-24 ENCOUNTER — Encounter: Payer: Self-pay | Admitting: Pharmacist

## 2013-10-24 VITALS — Ht 61.0 in | Wt 140.0 lb

## 2013-10-24 DIAGNOSIS — K5790 Diverticulosis of intestine, part unspecified, without perforation or abscess without bleeding: Secondary | ICD-10-CM | POA: Insufficient documentation

## 2013-10-24 DIAGNOSIS — M8580 Other specified disorders of bone density and structure, unspecified site: Secondary | ICD-10-CM

## 2013-10-24 DIAGNOSIS — Z13820 Encounter for screening for osteoporosis: Secondary | ICD-10-CM

## 2013-10-24 DIAGNOSIS — Z23 Encounter for immunization: Secondary | ICD-10-CM

## 2013-10-24 LAB — HM DEXA SCAN

## 2013-10-24 NOTE — Progress Notes (Signed)
Patient ID: [name], female   DOB: 1950-05-26, 63 y.o.   MRN: [id number]  [facility] Current Height: Height: 5\' 1"  (154.9 cm)      Max Lifetime Height:  5\' 1"  Current Weight: Weight: 140 lb (63.504 kg)       Ethnicity:Caucasian    HPI: Does pt already have a diagnosis of:  Osteopenia?  No Osteoporosis?  No  Back Pain?  No       Kyphosis?  Yes Prior fracture?  Yes - broken left ankle 2006 Med(s) for Osteoporosis/Osteopenia:  none Med(s) previously tried for Osteoporosis/Osteopenia:  none                                                             PMH: Age at menopause:  17's Hysterectomy?  No Oophorectomy?  No HRT? Yes - Former.  Type/duration: BCP Steroid Use?  No Thyroid med?  No History of cancer?  No History of digestive disorders (ie Crohn's)?  Yes Current or previous eating disorders?  No Last Vitamin D Result:  63 (11/24/2011) Last GFR Result:  85 (07/19/2013)   FH/SH: Family history of osteoporosis?  Yes - mother Parent with history of hip fracture?  No Family history of breast cancer?  No Exercise?  Yes - going to [facility] a little Smoking?  No Alcohol?  No    Calcium Assessment Calcium Intake  # of servings/day  Calcium mg  Milk (8 oz) 1  x  300  = 300mg   Yogurt (4 oz) 1 x  200 = 200mg   Cheese (1 oz) 1 x  200 = 200mg   Other Calcium sources   250mg   Ca supplement 600mg  qd = 600mg    Estimated calcium intake per day 1550mg     DEXA Results Date of Test T-Score for AP Spine L1-L4 T-Score for Total Left Hip T-Score for Total Right Hip T-Score for Neck of Left Hip T-Score for Neck of Right Hip  10/24/2013 -0.5 -0.4 -0.2 -1.2 -1.0  05/27/2010 0.0 0.1 0.2 -0.6 -0.6  02/15/2005 0.1 0.6 0.8 0.0 -0.3          FRAX 10 year estimate: Total FX risk:  13%  (consider medication if >/= 20%) Hip FX risk:  0.9%  (consider medication if >/= 3%)  Assessment: Osteopenia with low fracture risk per FRAX estimate  Recommendations: 1.  Discussed DEXA results and  fracture risk 2.  continue calcium 1200mg  daily through supplementation or diet.  3.  recommend weight bearing exercise - 30 minutes at least 4 days per week.   4.  Counseled and educated about fall risk and prevention. 5.   Influenza vaccine given in office today   Recheck DEXA:  2 years  Time spent counseling patient:  20 minutes  Cherre Robins, PharmD, CPP

## 2013-10-24 NOTE — Patient Instructions (Signed)

## 2013-11-02 ENCOUNTER — Ambulatory Visit (INDEPENDENT_AMBULATORY_CARE_PROVIDER_SITE_OTHER): Payer: Medicare HMO | Admitting: Nurse Practitioner

## 2013-11-02 ENCOUNTER — Encounter: Payer: Self-pay | Admitting: Nurse Practitioner

## 2013-11-02 VITALS — BP 143/84 | HR 86 | Temp 97.4°F | Ht 61.0 in | Wt 141.6 lb

## 2013-11-02 DIAGNOSIS — M8580 Other specified disorders of bone density and structure, unspecified site: Secondary | ICD-10-CM

## 2013-11-02 DIAGNOSIS — Z01419 Encounter for gynecological examination (general) (routine) without abnormal findings: Secondary | ICD-10-CM

## 2013-11-02 DIAGNOSIS — I10 Essential (primary) hypertension: Secondary | ICD-10-CM

## 2013-11-02 DIAGNOSIS — F411 Generalized anxiety disorder: Secondary | ICD-10-CM

## 2013-11-02 DIAGNOSIS — Z0000 Encounter for general adult medical examination without abnormal findings: Secondary | ICD-10-CM

## 2013-11-02 DIAGNOSIS — E785 Hyperlipidemia, unspecified: Secondary | ICD-10-CM

## 2013-11-02 LAB — POCT CBC
GRANULOCYTE PERCENT: 68.7 % (ref 37–80)
HCT, POC: 37.9 % (ref 37.7–47.9)
Hemoglobin: 12.5 g/dL (ref 12.2–16.2)
LYMPH, POC: 2.2 (ref 0.6–3.4)
MCH, POC: 28.2 pg (ref 27–31.2)
MCHC: 32.9 g/dL (ref 31.8–35.4)
MCV: 85.6 fL (ref 80–97)
MPV: 7.7 fL (ref 0–99.8)
PLATELET COUNT, POC: 461 10*3/uL — AB (ref 142–424)
POC Granulocyte: 5.9 (ref 2–6.9)
POC LYMPH PERCENT: 25.8 %L (ref 10–50)
RBC: 4.4 M/uL (ref 4.04–5.48)
RDW, POC: 13.8 %
WBC: 8.6 10*3/uL (ref 4.6–10.2)

## 2013-11-02 LAB — POCT UA - MICROSCOPIC ONLY
BACTERIA, U MICROSCOPIC: NEGATIVE
CRYSTALS, UR, HPF, POC: NEGATIVE
Casts, Ur, LPF, POC: NEGATIVE
MUCUS UA: NEGATIVE
RBC, URINE, MICROSCOPIC: NEGATIVE
Yeast, UA: NEGATIVE

## 2013-11-02 LAB — POCT URINALYSIS DIPSTICK
BILIRUBIN UA: NEGATIVE
Glucose, UA: NEGATIVE
KETONES UA: NEGATIVE
NITRITE UA: NEGATIVE
Protein, UA: NEGATIVE
RBC UA: NEGATIVE
SPEC GRAV UA: 1.01
Urobilinogen, UA: NEGATIVE
pH, UA: 6.5

## 2013-11-02 NOTE — Progress Notes (Addendum)
Subjective:    Patient ID: Maria Moss, female    DOB: [birth year], 63 y.o.   MRN: [id number]  Patient is here today for CPE with pap.   Hypertension This is a chronic problem. The current episode started more than 1 year ago. The problem has been waxing and waning since onset. The problem is uncontrolled. Associated symptoms include anxiety. Pertinent negatives include no chest pain, palpitations, peripheral edema or shortness of breath. Risk factors for coronary artery disease include dyslipidemia and post-menopausal state. Past treatments include ACE inhibitors and diuretics. The current treatment provides mild improvement. There is no history of a thyroid problem.  Hyperlipidemia This is a chronic problem. The current episode started more than 1 year ago. The problem is uncontrolled. Recent lipid tests were reviewed and are high. She has no history of diabetes. Pertinent negatives include no chest pain or shortness of breath. (Hx of myalgias with Crestor. Not taking any meds for hyperlipidemia at this time) Current antihyperlipidemic treatment includes diet change. The current treatment provides no improvement of lipids. Compliance problems include medication side effects.  Risk factors for coronary artery disease include dyslipidemia, hypertension and post-menopausal.  Anxiety Presents for follow-up visit. Patient reports no chest pain, palpitations or shortness of breath. Symptoms occur occasionally. The severity of symptoms is mild. The quality of sleep is fair. Nighttime awakenings: several.    IBS Use levsin when needed for cramping- uses PRN. She is seeing Dr. Carlton Adam.  GERD Omeprazole daily- keeps symptoms under control DDD Was diagnosed by S. Weeks and has been taking ultram for- she says that the pain is worsening in her lower back. Pain radiates down her left leg- no weakness that she has noted.    Review of Systems  Constitutional: Negative.   HENT: Negative.   Respiratory:  Negative for shortness of breath.   Cardiovascular: Negative for chest pain and palpitations.  Endocrine: Negative.   Musculoskeletal: Positive for back pain.  Neurological: Negative.   Hematological: Negative.   All other systems reviewed and are negative.      Objective:   Physical Exam  Vitals reviewed. Constitutional: She is oriented to person, place, and time. She appears well-developed and well-nourished.  HENT:  Head: Normocephalic.  Right Ear: Hearing, tympanic membrane, external ear and ear canal normal.  Left Ear: Hearing, tympanic membrane, external ear and ear canal normal.  Nose: Nose normal.  Mouth/Throat: Uvula is midline and oropharynx is clear and moist.  Eyes: Conjunctivae and EOM are normal. Pupils are equal, round, and reactive to light.  Neck: Normal range of motion and full passive range of motion without pain. Neck supple. No JVD present. Carotid bruit is not present. No mass and no thyromegaly present.  Cardiovascular: Normal rate, regular rhythm, normal heart sounds and intact distal pulses.   No murmur heard. Pulmonary/Chest: Effort normal and breath sounds normal.  Abdominal: Soft. Bowel sounds are normal. She exhibits no distension and no mass. There is no tenderness.  Genitourinary: Vagina normal and uterus normal. No breast swelling, tenderness, discharge or bleeding.  bimanual exam-No adnexal masses or tenderness.  Breast- no masses palpable  Musculoskeletal: Normal range of motion. She exhibits no edema.  Lymphadenopathy:    She has no cervical adenopathy.  Neurological: She is alert and oriented to person, place, and time.  Skin: Skin is warm and dry.  Psychiatric: She has a normal mood and affect. Her behavior is normal. Judgment and thought content normal.   BP 143/84  Pulse 86  Temp(Src) 97.4 F (36.3 C) (Oral)  Ht 5\' 1"  (1.549 m)  Wt 141 lb 9.6 oz (64.229 kg)  BMI 26.77 kg/m2       Assessment & Plan:  1. Annual physical exam -  POCT urinalysis dipstick - POCT UA - Microscopic Only - POCT CBC - Thyroid Panel With TSH - Vit D  25 hydroxy (rtn osteoporosis monitoring)  2. Encounter for routine gynecological examination - Pap IG w/ reflex to HPV when ASC-U  3. Essential hypertension Low NA+ diet - Brain natriuretic peptide  4. Hyperlipemia Low fat diet - NMR, lipoprofile  5. GAD (generalized anxiety disorder) Stress management  6. Osteopenia Weight bearing exercises    Labs pending Health maintenance reviewed Diet and exercise encouraged Continue all meds Follow up  In 3 months   [place], FNP

## 2013-11-02 NOTE — Patient Instructions (Signed)

## 2013-11-03 LAB — NMR, LIPOPROFILE
Cholesterol: 185 mg/dL (ref 100–199)
HDL CHOLESTEROL BY NMR: 76 mg/dL (ref >39)
HDL PARTICLE NUMBER: 45.2 umol/L (ref >=30.5)
LDL Particle Number: 926 nmol/L (ref <1000)
LDL Size: 20.9 nm (ref >20.5)
LDL-C: 77 mg/dL (ref 0–99)
LP-IR Score: 41 (ref <=45)
Small LDL Particle Number: 366 nmol/L (ref <=527)
Triglycerides by NMR: 159 mg/dL — ABNORMAL HIGH (ref 0–149)

## 2013-11-03 LAB — THYROID PANEL WITH TSH
FREE THYROXINE INDEX: 2.4 (ref 1.2–4.9)
T3 UPTAKE RATIO: 27 % (ref 24–39)
T4, Total: 8.9 ug/dL (ref 4.5–12.0)
TSH: 0.511 u[IU]/mL (ref 0.450–4.500)

## 2013-11-03 LAB — BRAIN NATRIURETIC PEPTIDE: BNP: 3.3 pg/mL (ref 0.0–100.0)

## 2013-11-03 LAB — VITAMIN D 25 HYDROXY (VIT D DEFICIENCY, FRACTURES): Vit D, 25-Hydroxy: 43.9 ng/mL (ref 30.0–100.0)

## 2013-11-05 ENCOUNTER — Telehealth: Payer: Self-pay | Admitting: Nurse Practitioner

## 2013-11-05 ENCOUNTER — Telehealth: Payer: Self-pay | Admitting: *Deleted

## 2013-11-05 DIAGNOSIS — M5136 Other intervertebral disc degeneration, lumbar region: Secondary | ICD-10-CM

## 2013-11-05 MED ORDER — FLUTICASONE PROPIONATE 50 MCG/ACT NA SUSP: NASAL | Status: DC

## 2013-11-05 MED ORDER — TRAMADOL HCL 50 MG PO TABS: 50.0000 mg | ORAL_TABLET | Freq: Two times a day (BID) | ORAL | Status: DC

## 2013-11-05 NOTE — Telephone Encounter (Signed)
Please send flonase refills to [facility] mail order.

## 2013-11-05 NOTE — Telephone Encounter (Signed)
Cannot do mail order for ultram- it is controlled and can not e rx must pick up rx.

## 2013-11-05 NOTE — Telephone Encounter (Signed)
Patient requesting 90 day supply of generic flonase be sent in for cheaper cost.

## 2013-11-05 NOTE — Telephone Encounter (Signed)
Already did that. 

## 2013-11-06 LAB — PAP IG W/ RFLX HPV ASCU: PAP Smear Comment: 0

## 2013-11-09 NOTE — Telephone Encounter (Signed)
Patient came in to pick her RX up.

## 2013-11-19 ENCOUNTER — Encounter: Payer: Self-pay | Admitting: Family Medicine

## 2013-11-19 ENCOUNTER — Ambulatory Visit (INDEPENDENT_AMBULATORY_CARE_PROVIDER_SITE_OTHER): Payer: Medicare HMO | Admitting: Family Medicine

## 2013-11-19 VITALS — BP 145/81 | HR 88 | Temp 98.7°F | Ht 61.0 in | Wt 140.6 lb

## 2013-11-19 DIAGNOSIS — R0989 Other specified symptoms and signs involving the circulatory and respiratory systems: Secondary | ICD-10-CM

## 2013-11-19 DIAGNOSIS — J029 Acute pharyngitis, unspecified: Secondary | ICD-10-CM

## 2013-11-19 DIAGNOSIS — R52 Pain, unspecified: Secondary | ICD-10-CM

## 2013-11-19 DIAGNOSIS — R509 Fever, unspecified: Secondary | ICD-10-CM

## 2013-11-19 LAB — POCT INFLUENZA A/B
Influenza A, POC: NEGATIVE
Influenza B, POC: NEGATIVE

## 2013-11-19 LAB — POCT RAPID STREP A (OFFICE): Rapid Strep A Screen: NEGATIVE

## 2013-11-19 MED ORDER — AMOXICILLIN 875 MG PO TABS: 875.0000 mg | ORAL_TABLET | Freq: Two times a day (BID) | ORAL | Status: DC

## 2013-11-19 MED ORDER — HYDROCODONE-HOMATROPINE 5-1.5 MG/5ML PO SYRP: 5.0000 mL | ORAL_SOLUTION | Freq: Three times a day (TID) | ORAL | Status: DC | PRN

## 2013-11-19 NOTE — Progress Notes (Signed)
   Subjective:    Patient ID: Maria Moss, female    DOB: 10/01/1950, 63 y.o.   MRN: [id number]  HPI Patient is here for c/o uri sx's and fatigue.  Review of Systems No chest pain, SOB, HA, dizziness, vision change, N/V, diarrhea, constipation, dysuria, urinary urgency or frequency, myalgias, arthralgias or rash.     Objective:    BP 145/81 mmHg  Pulse 88  Temp(Src) 98.7 F (37.1 C) (Oral)  Ht 5\' 1"  (1.549 m)  Wt 140 lb 9.6 oz (63.776 kg)  BMI 26.58 kg/m2 Physical Exam  Vital signs noted  Well developed well nourished female.  HEENT - Head atraumatic Normocephalic                Eyes - PERRLA, Conjuctiva - clear Sclera- Clear EOMI                Ears - EAC's Wnl TM's Wnl Gross Hearing WNL                Nose - Nares patent                 Throat - oropharanx wnl Respiratory - Lungs CTA bilateral Cardiac - RRR S1 and S2 without murmur GI - Abdomen soft Nontender and bowel sounds active x 4 Extremities - No edema. Neuro - Grossly intact.      Assessment & Plan:     ICD-9-CM ICD-10-CM   1. Fever and chills 780.60 R50.9 POCT rapid strep A     POCT Influenza A/B     HYDROcodone-homatropine (HYCODAN) 5-1.5 MG/5ML syrup     amoxicillin (AMOXIL) 875 MG tablet  2. Body aches 780.96 R52 POCT rapid strep A     POCT Influenza A/B     HYDROcodone-homatropine (HYCODAN) 5-1.5 MG/5ML syrup     amoxicillin (AMOXIL) 875 MG tablet  3. Chest congestion 786.9 R09.89 POCT rapid strep A     POCT Influenza A/B     HYDROcodone-homatropine (HYCODAN) 5-1.5 MG/5ML syrup     amoxicillin (AMOXIL) 875 MG tablet  4. Sore throat 462 J02.9 POCT rapid strep A     POCT Influenza A/B     HYDROcodone-homatropine (HYCODAN) 5-1.5 MG/5ML syrup     amoxicillin (AMOXIL) 875 MG tablet     Return if symptoms worsen or fail to improve.  Lysbeth Penner FNP

## 2013-12-11 ENCOUNTER — Telehealth: Payer: Self-pay | Admitting: *Deleted

## 2013-12-11 MED ORDER — HYDROCHLOROTHIAZIDE 25 MG PO TABS: 25.0000 mg | ORAL_TABLET | Freq: Every day | ORAL | Status: DC

## 2013-12-11 MED ORDER — SIMVASTATIN 20 MG PO TABS: ORAL_TABLET | ORAL | Status: DC

## 2013-12-11 MED ORDER — ENALAPRIL MALEATE 20 MG PO TABS: ORAL_TABLET | ORAL | Status: DC

## 2013-12-11 MED ORDER — OMEPRAZOLE 40 MG PO CPDR: DELAYED_RELEASE_CAPSULE | ORAL | Status: DC

## 2013-12-11 NOTE — Telephone Encounter (Signed)
Pt has appointment 02/2014, [facility] faxed over request for rx refills. 90 day supply given to get her through until appt.

## 2013-12-14 ENCOUNTER — Telehealth: Payer: Self-pay | Admitting: Nurse Practitioner

## 2013-12-14 NOTE — Telephone Encounter (Signed)
Pt aware ntbs, will close encounter.

## 2013-12-14 NOTE — Telephone Encounter (Signed)
NTBS to get prednisone

## 2013-12-14 NOTE — Telephone Encounter (Signed)
Please advise 

## 2013-12-21 ENCOUNTER — Telehealth: Payer: Self-pay | Admitting: Nurse Practitioner

## 2013-12-21 ENCOUNTER — Other Ambulatory Visit: Payer: Self-pay | Admitting: Nurse Practitioner

## 2013-12-21 DIAGNOSIS — M5136 Other intervertebral disc degeneration, lumbar region: Secondary | ICD-10-CM

## 2013-12-21 MED ORDER — TRAMADOL HCL 50 MG PO TABS: 50.0000 mg | ORAL_TABLET | Freq: Two times a day (BID) | ORAL | Status: DC

## 2013-12-21 NOTE — Telephone Encounter (Signed)
Ultram rx ready for pick up  

## 2013-12-24 NOTE — Telephone Encounter (Signed)
Patient picked up script

## 2013-12-25 ENCOUNTER — Encounter: Payer: Self-pay | Admitting: Nurse Practitioner

## 2013-12-25 ENCOUNTER — Ambulatory Visit (INDEPENDENT_AMBULATORY_CARE_PROVIDER_SITE_OTHER): Payer: Medicare HMO | Admitting: Nurse Practitioner

## 2013-12-25 VITALS — BP 156/92 | HR 93 | Temp 99.3°F | Ht 61.0 in | Wt 144.2 lb

## 2013-12-25 DIAGNOSIS — J209 Acute bronchitis, unspecified: Secondary | ICD-10-CM

## 2013-12-25 MED ORDER — HYDROCODONE-HOMATROPINE 5-1.5 MG/5ML PO SYRP: 5.0000 mL | ORAL_SOLUTION | Freq: Three times a day (TID) | ORAL | Status: DC | PRN

## 2013-12-25 MED ORDER — AZITHROMYCIN 250 MG PO TABS: ORAL_TABLET | ORAL | Status: DC

## 2013-12-25 MED ORDER — METHYLPREDNISOLONE ACETATE 80 MG/ML IJ SUSP
80.0000 mg | Freq: Once | INTRAMUSCULAR | Status: AC
  Administered 2013-12-25: 80 mg via INTRAMUSCULAR

## 2013-12-25 NOTE — Progress Notes (Addendum)
Subjective:    Patient ID: [name], female    DOB: 28-Mar-1950, 63 y.o.   MRN: [id number]  HPI Patient in c/o cough and congestion- started last week- OTC meds  Not helping- cough is worse at night or layong down.    Review of Systems  Constitutional: Positive for fever (low grade). Negative for chills and appetite change.  HENT: Positive for congestion, rhinorrhea, sinus pressure and sore throat. Negative for ear pain, trouble swallowing and voice change.   Respiratory: Positive for cough.   Cardiovascular: Negative.   Gastrointestinal: Negative.   Genitourinary: Negative.   Neurological: Negative.   Psychiatric/Behavioral: Negative.   All other systems reviewed and are negative.      Objective:   Physical Exam  Constitutional: She is oriented to person, place, and time. She appears well-developed and well-nourished. No distress.  HENT:  Right Ear: Hearing, tympanic membrane, external ear and ear canal normal.  Left Ear: Hearing, tympanic membrane, external ear and ear canal normal.  Nose: Mucosal edema and rhinorrhea present. Right sinus exhibits no maxillary sinus tenderness and no frontal sinus tenderness. Left sinus exhibits no maxillary sinus tenderness and no frontal sinus tenderness.  Mouth/Throat: Uvula is midline, oropharynx is clear and moist and mucous membranes are normal.  Neck: Normal range of motion. Neck supple.  Cardiovascular: Normal rate, regular rhythm and normal heart sounds.   Pulmonary/Chest: Effort normal and breath sounds normal. No respiratory distress. She has no wheezes. She has no rales.  Dry deep cough  Lymphadenopathy:    She has no cervical adenopathy.  Neurological: She is alert and oriented to person, place, and time.  Skin: Skin is warm and dry.  Psychiatric: She has a normal mood and affect. Her behavior is normal. Judgment and thought content normal.    BP 156/92 mmHg  Pulse 93  Temp(Src) 99.3 F (37.4 C) (Oral)  Ht 5\' 1"  (1.549 m)   Wt 144 lb 3.2 oz (65.409 kg)  BMI 27.26 kg/m2       Assessment & Plan:   1. Acute bronchitis, unspecified organism    Meds ordered this encounter  Medications  . azithromycin (ZITHROMAX Z-PAK) 250 MG tablet    Sig: As directed    Dispense:  6 each    Refill:  0    Order Specific Question:  Supervising Provider    Answer:  Chipper Herb [[id number]]  . DISCONTD: HYDROcodone-homatropine (HYCODAN) 5-1.5 MG/5ML syrup    Sig: Take 5 mLs by mouth every 8 (eight) hours as needed for cough.    Dispense:  120 mL    Refill:  0    Order Specific Question:  Supervising Provider    Answer:  Chipper Herb [[id number]]  . HYDROcodone-homatropine (HYCODAN) 5-1.5 MG/5ML syrup    Sig: Take 5 mLs by mouth every 8 (eight) hours as needed for cough.    Dispense:  120 mL    Refill:  0    Order Specific Question:  Supervising Provider    Answer:  Chipper Herb [[id number]]  . methylPREDNISolone acetate (DEPO-MEDROL) injection 80 mg    Sig:     1. Take meds as prescribed 2. Use a cool mist humidifier especially during the winter months and when heat has been humid. 3. Use saline nose sprays frequently 4. Saline irrigations of the nose can be very helpful if done frequently.  * 4X daily for 1 week*  * Use of a nettie pot can be helpful with this.  Follow directions with this* 5. Drink plenty of fluids 6. Keep thermostat turn down low 7.For any cough or congestion  Use plain Mucinex- regular strength or max strength is fine   * Children- consult with Pharmacist for dosing 8. For fever or aces or pains- take tylenol or ibuprofen appropriate for age and weight.  * for fevers greater than 101 orally you may alternate ibuprofen and tylenol every  3 hours.   Swathi-Margaret Hassell Done, FNP

## 2013-12-25 NOTE — Patient Instructions (Signed)

## 2013-12-25 NOTE — Addendum Note (Signed)
Addended by: Chevis Pretty on: 12/25/2013 05:38 PM   Modules accepted: Orders

## 2014-02-05 ENCOUNTER — Telehealth: Payer: Self-pay | Admitting: Nurse Practitioner

## 2014-02-05 DIAGNOSIS — M5136 Other intervertebral disc degeneration, lumbar region: Secondary | ICD-10-CM

## 2014-02-05 MED ORDER — TRAMADOL HCL 50 MG PO TABS: 50.0000 mg | ORAL_TABLET | Freq: Two times a day (BID) | ORAL | Status: DC

## 2014-02-05 NOTE — Telephone Encounter (Signed)
Ultram rx ready for pick up  

## 2014-02-06 NOTE — Telephone Encounter (Signed)
Patient notified that rx will be ready to pick up after 2pm today

## 2014-02-15 ENCOUNTER — Ambulatory Visit (INDEPENDENT_AMBULATORY_CARE_PROVIDER_SITE_OTHER): Payer: Commercial Managed Care - HMO

## 2014-02-15 ENCOUNTER — Ambulatory Visit (INDEPENDENT_AMBULATORY_CARE_PROVIDER_SITE_OTHER): Payer: Commercial Managed Care - HMO | Admitting: Nurse Practitioner

## 2014-02-15 ENCOUNTER — Other Ambulatory Visit: Payer: Self-pay | Admitting: Family Medicine

## 2014-02-15 ENCOUNTER — Encounter: Payer: Self-pay | Admitting: Nurse Practitioner

## 2014-02-15 VITALS — BP 151/85 | HR 84 | Temp 97.0°F | Ht 61.0 in | Wt 148.0 lb

## 2014-02-15 DIAGNOSIS — F411 Generalized anxiety disorder: Secondary | ICD-10-CM

## 2014-02-15 DIAGNOSIS — M5136 Other intervertebral disc degeneration, lumbar region: Secondary | ICD-10-CM

## 2014-02-15 DIAGNOSIS — I10 Essential (primary) hypertension: Secondary | ICD-10-CM

## 2014-02-15 DIAGNOSIS — E785 Hyperlipidemia, unspecified: Secondary | ICD-10-CM

## 2014-02-15 MED ORDER — ALPRAZOLAM 0.5 MG PO TABS: ORAL_TABLET | ORAL | Status: DC

## 2014-02-15 NOTE — Progress Notes (Signed)
Subjective:    Patient ID: Maria Moss, female    DOB: 15-May-1950, 64 y.o.   MRN: [id number]  Patient is here today for CPE with pap.   Hypertension This is a chronic problem. The current episode started more than 1 year ago. The problem is unchanged. The problem is uncontrolled. Associated symptoms include anxiety. Pertinent negatives include no chest pain, palpitations or shortness of breath. Risk factors for coronary artery disease include diabetes mellitus, dyslipidemia, post-menopausal state and sedentary lifestyle. Past treatments include diuretics and ACE inhibitors. The current treatment provides moderate improvement. Compliance problems include diet and exercise.   Hyperlipidemia This is a chronic problem. The current episode started more than 1 year ago. Recent lipid tests were reviewed and are variable. She has no history of diabetes, hypothyroidism or obesity. Pertinent negatives include no chest pain or shortness of breath. Current antihyperlipidemic treatment includes statins. The current treatment provides moderate improvement of lipids. Compliance problems include adherence to diet and adherence to exercise.  Risk factors for coronary artery disease include dyslipidemia, hypertension and post-menopausal.  Anxiety Presents for follow-up visit. Patient reports no chest pain, palpitations or shortness of breath. Symptoms occur most days. The severity of symptoms is moderate. Nothing aggravates the symptoms. The quality of sleep is good. Nighttime awakenings: none.    IBS Use levsin when needed for cramping- uses PRN. She is seeing Dr. Carlton Adam.  GERD Omeprazole daily- keeps symptoms under control DDD Was diagnosed by S. Weeks and has been taking ultram for- she says that the pain is worsening in her lower back. Pain radiates down her left leg- no weakness that she has noted.    Review of Systems  Constitutional: Negative.   HENT: Negative.   Respiratory: Negative for shortness of  breath.   Cardiovascular: Negative for chest pain and palpitations.  Endocrine: Negative.   Musculoskeletal: Positive for back pain.  Neurological: Negative.   Hematological: Negative.   All other systems reviewed and are negative.      Objective:   Physical Exam  Constitutional: She is oriented to person, place, and time. She appears well-developed and well-nourished.  HENT:  Head: Normocephalic.  Right Ear: Hearing, tympanic membrane, external ear and ear canal normal.  Left Ear: Hearing, tympanic membrane, external ear and ear canal normal.  Nose: Nose normal.  Mouth/Throat: Uvula is midline and oropharynx is clear and moist.  Eyes: Conjunctivae and EOM are normal. Pupils are equal, round, and reactive to light.  Neck: Normal range of motion and full passive range of motion without pain. Neck supple. No JVD present. Carotid bruit is not present. No thyroid mass and no thyromegaly present.  Cardiovascular: Normal rate, regular rhythm, normal heart sounds and intact distal pulses.   No murmur heard. Pulmonary/Chest: Effort normal and breath sounds normal.  Abdominal: Soft. Bowel sounds are normal. She exhibits no distension and no mass. There is no tenderness.  Genitourinary: No breast swelling, tenderness, discharge or bleeding.  Musculoskeletal: Normal range of motion. She exhibits no edema.  Lymphadenopathy:    She has no cervical adenopathy.  Neurological: She is alert and oriented to person, place, and time.  Skin: Skin is warm and dry.  Psychiatric: She has a normal mood and affect. Her behavior is normal. Judgment and thought content normal.  Vitals reviewed.  BP 151/85 mmHg  Pulse 84  Temp(Src) 97 F (36.1 C) (Oral)  Ht 5' 1"  (1.549 m)  Wt 148 lb (67.132 kg)  BMI 27.98 kg/m2  Chest x ray-  normal- no acute findings-Preliminary reading by Ronnald Collum, FNP  [facility]   EKG- NSR- Dasie-Margaret Hassell Done, FNP       Assessment & Plan:   1. Essential hypertension Do not  add saltt to diet Increase HCTZ to 1 tablet daily Keep diary of blood pressures - DG Chest 2 View; Future - EKG 12-Lead - CMP14+EGFR  2. Degenerative disc disease, lumbar Stretching exercises  3. Hyperlipemia Low fat diet - NMR, lipoprofile  4. GAD (generalized anxiety disorder) Stress management - ALPRAZolam (XANAX) 0.5 MG tablet; TAKE 1 TABLET BY MOUTH AT BEDTIME AS NEEDED FOR SLEEP  Dispense: 90 tablet; Refill: 0    Labs pending Health maintenance reviewed Diet and exercise encouraged Continue all meds Follow up  In 3 months   [place], FNP

## 2014-02-15 NOTE — Patient Instructions (Signed)
Exercise to Stay Healthy Exercise helps you become and stay healthy. EXERCISE IDEAS AND TIPS Choose exercises that:  You enjoy.  Fit into your day. You do not need to exercise really hard to be healthy. You can do exercises at a slow or medium level and stay healthy. You can:  Stretch before and after working out.  Try yoga, Pilates, or tai chi.  Lift weights.  Walk fast, swim, jog, run, climb stairs, bicycle, dance, or rollerskate.  Take aerobic classes. Exercises that burn about 150 calories:  Running 1  miles in 15 minutes.  Playing volleyball for 45 to 60 minutes.  Washing and waxing a car for 45 to 60 minutes.  Playing touch football for 45 minutes.  Walking 1  miles in 35 minutes.  Pushing a stroller 1  miles in 30 minutes.  Playing basketball for 30 minutes.  Raking leaves for 30 minutes.  Bicycling 5 miles in 30 minutes.  Walking 2 miles in 30 minutes.  Dancing for 30 minutes.  Shoveling snow for 15 minutes.  Swimming laps for 20 minutes.  Walking up stairs for 15 minutes.  Bicycling 4 miles in 15 minutes.  Gardening for 30 to 45 minutes.  Jumping rope for 15 minutes.  Washing windows or floors for 45 to 60 minutes. Document Released: 01/23/2010 Document Revised: 03/15/2011 Document Reviewed: 01/23/2010 ExitCare Patient Information 2015 [employer]. This information is not intended to replace advice given to you by your health care provider. Make sure you discuss any questions you have with your health care provider.  

## 2014-02-16 LAB — NMR, LIPOPROFILE
Cholesterol: 191 mg/dL (ref 100–199)
HDL Cholesterol by NMR: 94 mg/dL (ref >39)
HDL Particle Number: 54.8 umol/L (ref >=30.5)
LDL Particle Number: 728 nmol/L (ref <1000)
LDL SIZE: 21.5 nm (ref >20.5)
LDL-C: 69 mg/dL (ref 0–99)
LP-IR Score: 35 (ref <=45)
Small LDL Particle Number: 300 nmol/L (ref <=527)
TRIGLYCERIDES BY NMR: 138 mg/dL (ref 0–149)

## 2014-02-16 LAB — CMP14+EGFR
ALK PHOS: 118 IU/L — AB (ref 39–117)
ALT: 29 IU/L (ref 0–32)
AST: 26 IU/L (ref 0–40)
Albumin/Globulin Ratio: 2 (ref 1.1–2.5)
Albumin: 4.7 g/dL (ref 3.6–4.8)
BILIRUBIN TOTAL: 0.7 mg/dL (ref 0.0–1.2)
BUN/Creatinine Ratio: 21 (ref 11–26)
BUN: 15 mg/dL (ref 8–27)
CALCIUM: 10.3 mg/dL (ref 8.7–10.3)
CHLORIDE: 98 mmol/L (ref 97–108)
CO2: 27 mmol/L (ref 18–29)
Creatinine, Ser: 0.72 mg/dL (ref 0.57–1.00)
GFR calc Af Amer: 104 mL/min/{1.73_m2} (ref >59)
GFR calc non Af Amer: 90 mL/min/{1.73_m2} (ref >59)
GLUCOSE: 101 mg/dL — AB (ref 65–99)
Globulin, Total: 2.3 g/dL (ref 1.5–4.5)
Potassium: 4.2 mmol/L (ref 3.5–5.2)
SODIUM: 141 mmol/L (ref 134–144)
TOTAL PROTEIN: 7 g/dL (ref 6.0–8.5)

## 2014-02-19 ENCOUNTER — Encounter: Payer: Self-pay | Admitting: Nurse Practitioner

## 2014-03-11 ENCOUNTER — Other Ambulatory Visit: Payer: Self-pay | Admitting: Nurse Practitioner

## 2014-03-13 ENCOUNTER — Other Ambulatory Visit: Payer: Self-pay | Admitting: Nurse Practitioner

## 2014-03-13 DIAGNOSIS — M5136 Other intervertebral disc degeneration, lumbar region: Secondary | ICD-10-CM

## 2014-03-13 MED ORDER — TRAMADOL HCL 50 MG PO TABS
50.0000 mg | ORAL_TABLET | Freq: Two times a day (BID) | ORAL | Status: DC
Start: 2014-03-13 — End: 2014-04-23

## 2014-03-13 NOTE — Telephone Encounter (Signed)
Please review and advise.

## 2014-03-13 NOTE — Telephone Encounter (Signed)
Ultram rx ready for pick up  

## 2014-03-13 NOTE — Telephone Encounter (Signed)
Pt notified of RX 

## 2014-04-19 ENCOUNTER — Other Ambulatory Visit: Payer: Self-pay | Admitting: Nurse Practitioner

## 2014-04-22 NOTE — Telephone Encounter (Signed)
Ultram rx ready for pick up  

## 2014-04-22 NOTE — Telephone Encounter (Signed)
Patient aware rx is ready to be picked up 

## 2014-04-23 ENCOUNTER — Other Ambulatory Visit: Payer: Self-pay | Admitting: Nurse Practitioner

## 2014-04-23 DIAGNOSIS — M5136 Other intervertebral disc degeneration, lumbar region: Secondary | ICD-10-CM

## 2014-04-23 MED ORDER — TRAMADOL HCL 50 MG PO TABS: 50.0000 mg | ORAL_TABLET | Freq: Two times a day (BID) | ORAL | Status: DC

## 2014-05-17 ENCOUNTER — Ambulatory Visit (INDEPENDENT_AMBULATORY_CARE_PROVIDER_SITE_OTHER): Payer: Commercial Managed Care - HMO | Admitting: Nurse Practitioner

## 2014-05-17 ENCOUNTER — Encounter: Payer: Self-pay | Admitting: Nurse Practitioner

## 2014-05-17 VITALS — BP 133/82 | HR 85 | Temp 97.5°F | Ht 61.0 in | Wt 145.0 lb

## 2014-05-17 DIAGNOSIS — K589 Irritable bowel syndrome without diarrhea: Secondary | ICD-10-CM | POA: Diagnosis not present

## 2014-05-17 DIAGNOSIS — F411 Generalized anxiety disorder: Secondary | ICD-10-CM | POA: Diagnosis not present

## 2014-05-17 DIAGNOSIS — E785 Hyperlipidemia, unspecified: Secondary | ICD-10-CM

## 2014-05-17 DIAGNOSIS — M51369 Other intervertebral disc degeneration, lumbar region without mention of lumbar back pain or lower extremity pain: Secondary | ICD-10-CM

## 2014-05-17 DIAGNOSIS — M5136 Other intervertebral disc degeneration, lumbar region: Secondary | ICD-10-CM | POA: Diagnosis not present

## 2014-05-17 DIAGNOSIS — K219 Gastro-esophageal reflux disease without esophagitis: Secondary | ICD-10-CM

## 2014-05-17 DIAGNOSIS — I10 Essential (primary) hypertension: Secondary | ICD-10-CM | POA: Diagnosis not present

## 2014-05-17 DIAGNOSIS — K5730 Diverticulosis of large intestine without perforation or abscess without bleeding: Secondary | ICD-10-CM | POA: Diagnosis not present

## 2014-05-17 DIAGNOSIS — M25512 Pain in left shoulder: Secondary | ICD-10-CM | POA: Diagnosis not present

## 2014-05-17 MED ORDER — SIMVASTATIN 20 MG PO TABS: ORAL_TABLET | ORAL | Status: DC

## 2014-05-17 MED ORDER — TRAMADOL HCL 50 MG PO TABS
50.0000 mg | ORAL_TABLET | Freq: Two times a day (BID) | ORAL | Status: DC
Start: 2014-05-17 — End: 2014-06-28

## 2014-05-17 MED ORDER — HYOSCYAMINE SULFATE 0.125 MG SL SUBL: 0.1250 mg | SUBLINGUAL_TABLET | SUBLINGUAL | Status: DC | PRN

## 2014-05-17 MED ORDER — HYDROCHLOROTHIAZIDE 25 MG PO TABS: 25.0000 mg | ORAL_TABLET | Freq: Every day | ORAL | Status: DC

## 2014-05-17 MED ORDER — ALPRAZOLAM 0.5 MG PO TABS: ORAL_TABLET | ORAL | Status: DC

## 2014-05-17 MED ORDER — ENALAPRIL MALEATE 20 MG PO TABS
ORAL_TABLET | ORAL | Status: DC
Start: 2014-05-17 — End: 2014-11-29

## 2014-05-17 MED ORDER — METHYLPREDNISOLONE ACETATE 80 MG/ML IJ SUSP
80.0000 mg | Freq: Once | INTRAMUSCULAR | Status: AC
  Administered 2014-05-17: 80 mg via INTRAMUSCULAR

## 2014-05-17 MED ORDER — OMEPRAZOLE 40 MG PO CPDR: DELAYED_RELEASE_CAPSULE | ORAL | Status: DC

## 2014-05-17 NOTE — Patient Instructions (Signed)
Diverticulosis Diverticulosis is the condition that develops when small pouches (diverticula) form in the wall of your colon. Your colon, or large intestine, is where water is absorbed and stool is formed. The pouches form when the inside layer of your colon pushes through weak spots in the outer layers of your colon. CAUSES  No one knows exactly what causes diverticulosis. RISK FACTORS  Being older than 50. Your risk for this condition increases with age. Diverticulosis is rare in people younger than 40 years. By age 80, almost everyone has it.  Eating a low-fiber diet.  Being frequently constipated.  Being overweight.  Not getting enough exercise.  Smoking.  Taking over-the-counter pain medicines, like aspirin and ibuprofen. SYMPTOMS  Most people with diverticulosis do not have symptoms. DIAGNOSIS  Because diverticulosis often has no symptoms, health care providers often discover the condition during an exam for other colon problems. In many cases, a health care provider will diagnose diverticulosis while using a flexible scope to examine the colon (colonoscopy). TREATMENT  If you have never developed an infection related to diverticulosis, you may not need treatment. If you have had an infection before, treatment may include:  Eating more fruits, vegetables, and grains.  Taking a fiber supplement.  Taking a live bacteria supplement (probiotic).  Taking medicine to relax your colon. HOME CARE INSTRUCTIONS   Drink at least 6-8 glasses of water each day to prevent constipation.  Try not to strain when you have a bowel movement.  Keep all follow-up appointments. If you have had an infection before:  Increase the fiber in your diet as directed by your health care provider or dietitian.  Take a dietary fiber supplement if your health care provider approves.  Only take medicines as directed by your health care provider. SEEK MEDICAL CARE IF:   You have abdominal  pain.  You have bloating.  You have cramps.  You have not gone to the bathroom in 3 days. SEEK IMMEDIATE MEDICAL CARE IF:   Your pain gets worse.  Yourbloating becomes very bad.  You have a fever or chills, and your symptoms suddenly get worse.  You begin vomiting.  You have bowel movements that are bloody or black. MAKE SURE YOU:  Understand these instructions.  Will watch your condition.  Will get help right away if you are not doing well or get worse. Document Released: 09/18/2003 Document Revised: 12/26/2012 Document Reviewed: 11/15/2012 ExitCare Patient Information 2015 [employer]. This information is not intended to replace advice given to you by your health care provider. Make sure you discuss any questions you have with your health care provider.  

## 2014-05-17 NOTE — Progress Notes (Signed)
Subjective:    Patient ID: [name], female    DOB: Oct 20, 1950, 64 y.o.   MRN: [id number]  Patient is here today for follow up of chronic medical problems. Her only complaint today is left intermittent shoulder pain. Rates 5-8/10. Worse with movement.  Hypertension This is a chronic problem. The current episode started more than 1 year ago. The problem is unchanged. The problem is uncontrolled. Associated symptoms include anxiety. Pertinent negatives include no chest pain, palpitations or shortness of breath. Risk factors for coronary artery disease include diabetes mellitus, dyslipidemia, post-menopausal state and sedentary lifestyle. Past treatments include diuretics and ACE inhibitors. The current treatment provides moderate improvement. Compliance problems include diet and exercise.   Hyperlipidemia This is a chronic problem. The current episode started more than 1 year ago. Recent lipid tests were reviewed and are variable. She has no history of diabetes, hypothyroidism or obesity. Pertinent negatives include no chest pain or shortness of breath. Current antihyperlipidemic treatment includes statins. The current treatment provides moderate improvement of lipids. Compliance problems include adherence to diet and adherence to exercise.  Risk factors for coronary artery disease include dyslipidemia, hypertension and post-menopausal.  Anxiety Presents for follow-up visit. Patient reports no chest pain, palpitations or shortness of breath. Symptoms occur most days. The severity of symptoms is moderate. Nothing aggravates the symptoms. The quality of sleep is good. Nighttime awakenings: none.    IBS/diverticulosis Use levsin when needed for cramping- uses PRN. She is seeing Dr. Carlton Adam. No recent flare ups GERD Omeprazole daily- keeps symptoms under control DDD Was diagnosed by S. Weeks and has been taking ultram for- she says that the pain is worsening in her lower back. Pain radiates down her  left leg- no weakness that she has noted. osteopenia No c/o back pain- very little exercise  Review of Systems  Constitutional: Negative.   HENT: Negative.   Respiratory: Negative for shortness of breath.   Cardiovascular: Negative for chest pain and palpitations.  Endocrine: Negative.   Musculoskeletal: Positive for back pain.  Neurological: Negative.   Hematological: Negative.   All other systems reviewed and are negative.      Objective:   Physical Exam  Constitutional: She is oriented to person, place, and time. She appears well-developed and well-nourished.  HENT:  Head: Normocephalic.  Right Ear: Hearing, tympanic membrane, external ear and ear canal normal.  Left Ear: Hearing, tympanic membrane, external ear and ear canal normal.  Nose: Nose normal.  Mouth/Throat: Uvula is midline and oropharynx is clear and moist.  Eyes: Conjunctivae and EOM are normal. Pupils are equal, round, and reactive to light.  Neck: Normal range of motion and full passive range of motion without pain. Neck supple. No JVD present. Carotid bruit is not present. No thyroid mass and no thyromegaly present.  Cardiovascular: Normal rate, regular rhythm, normal heart sounds and intact distal pulses.   No murmur heard. Pulmonary/Chest: Effort normal and breath sounds normal.  Abdominal: Soft. Bowel sounds are normal. She exhibits no distension and no mass. There is no tenderness.  Genitourinary: No breast swelling, tenderness, discharge or bleeding.  Musculoskeletal: Normal range of motion. She exhibits no edema.  Lymphadenopathy:    She has no cervical adenopathy.  Neurological: She is alert and oriented to person, place, and time.  Skin: Skin is warm and dry.  Psychiatric: She has a normal mood and affect. Her behavior is normal. Judgment and thought content normal.  Vitals reviewed.  BP 133/82 mmHg  Pulse 85  Temp(Src)  97.5 F (36.4 C) (Oral)  Ht 5' 1"  (1.549 m)  Wt 145 lb (65.772 kg)  BMI  27.41 kg/m2        Assessment & Plan:   1. Essential hypertension Do not add salt to diet - hydrochlorothiazide (HYDRODIURIL) 25 MG tablet; Take 1 tablet (25 mg total) by mouth daily.  Dispense: 90 tablet; Refill: 0 - enalapril (VASOTEC) 20 MG tablet; TAKE 1 AND 1/2 TABLETS EVERY DAY (PATIENT WILL GET FURTHER REFILLS AT APPOINTMENT 02/2014)  Dispense: 135 tablet; Refill: 1 - CMP14+EGFR  2. Diverticulosis of large intestine without hemorrhage Keep follow up with GI  3. Degenerative disc disease, lumbar - traMADol (ULTRAM) 50 MG tablet; Take 1 tablet (50 mg total) by mouth 2 (two) times daily.  Dispense: 60 tablet; Refill: 0  4. Hyperlipemia Low fta diet - simvastatin (ZOCOR) 20 MG tablet; TAKE 1 TABLET AT BEDTIME (PATIENT WILL GET FURTHER REFILLS AT APPOINTMENT 02/2014)  Dispense: 90 tablet; Refill: 1 - NMR, lipoprofile  5. GAD (generalized anxiety disorder) Stress management - ALPRAZolam (XANAX) 0.5 MG tablet; TAKE 1 TABLET BY MOUTH AT BEDTIME AS NEEDED FOR SLEEP  Dispense: 90 tablet; Refill: 0  6. Gastroesophageal reflux disease without esophagitis Avoid spicy foods Do not eat 2 hours prior to bedtime - omeprazole (PRILOSEC) 40 MG capsule; TAKE 1 CAPSULE EVERY DAY (PATIENT WILL GET FURTHER REFILLS AT APPOINTMENT 02/2014)  Dispense: 90 capsule; Refill: 1  7. Shoulder pain, acute, left Moist heat Stretching exercises - methylPREDNISolone acetate (DEPO-MEDROL) injection 80 mg; Inject 1 mL (80 mg total) into the muscle once.  8. IBS (irritable bowel syndrome) Keep follow up with GI - hyoscyamine (LEVSIN SL) 0.125 MG SL tablet; Place 1 tablet (0.125 mg total) under the tongue every 4 (four) hours as needed for cramping.  Dispense: 30 tablet; Refill: 5   hemoccult cards given to patient with directions Labs pending Health maintenance reviewed Diet and exercise encouraged Continue all meds Follow up  In 6 months   [place], FNP

## 2014-05-18 LAB — CMP14+EGFR
A/G RATIO: 1.7 (ref 1.1–2.5)
ALK PHOS: 118 IU/L — AB (ref 39–117)
ALT: 31 IU/L (ref 0–32)
AST: 26 IU/L (ref 0–40)
Albumin: 4.3 g/dL (ref 3.6–4.8)
BUN/Creatinine Ratio: 17 (ref 11–26)
BUN: 13 mg/dL (ref 8–27)
Bilirubin Total: 0.7 mg/dL (ref 0.0–1.2)
CALCIUM: 9.9 mg/dL (ref 8.7–10.3)
CHLORIDE: 99 mmol/L (ref 97–108)
CO2: 27 mmol/L (ref 18–29)
CREATININE: 0.75 mg/dL (ref 0.57–1.00)
GFR calc Af Amer: 99 mL/min/{1.73_m2} (ref >59)
GFR calc non Af Amer: 86 mL/min/{1.73_m2} (ref >59)
GLOBULIN, TOTAL: 2.5 g/dL (ref 1.5–4.5)
Glucose: 94 mg/dL (ref 65–99)
POTASSIUM: 4.1 mmol/L (ref 3.5–5.2)
SODIUM: 144 mmol/L (ref 134–144)
Total Protein: 6.8 g/dL (ref 6.0–8.5)

## 2014-05-18 LAB — NMR, LIPOPROFILE
CHOLESTEROL: 161 mg/dL (ref 100–199)
HDL Cholesterol by NMR: 67 mg/dL (ref >39)
HDL Particle Number: 46 umol/L (ref >=30.5)
LDL Particle Number: 850 nmol/L (ref <1000)
LDL SIZE: 20.8 nm (ref >20.5)
LDL-C: 63 mg/dL (ref 0–99)
LP-IR SCORE: 44 (ref <=45)
Small LDL Particle Number: 263 nmol/L (ref <=527)
TRIGLYCERIDES BY NMR: 154 mg/dL — AB (ref 0–149)

## 2014-05-23 ENCOUNTER — Other Ambulatory Visit: Payer: Commercial Managed Care - HMO

## 2014-05-23 DIAGNOSIS — K5730 Diverticulosis of large intestine without perforation or abscess without bleeding: Secondary | ICD-10-CM

## 2014-05-23 NOTE — Progress Notes (Signed)
Lab only 

## 2014-05-26 LAB — FECAL OCCULT BLOOD, IMMUNOCHEMICAL: Fecal Occult Bld: NEGATIVE

## 2014-05-28 ENCOUNTER — Telehealth: Payer: Self-pay

## 2014-05-28 ENCOUNTER — Other Ambulatory Visit: Payer: Self-pay | Admitting: Nurse Practitioner

## 2014-05-28 MED ORDER — LUBIPROSTONE 8 MCG PO CAPS: 8.0000 ug | ORAL_CAPSULE | Freq: Two times a day (BID) | ORAL | Status: DC

## 2014-05-28 NOTE — Telephone Encounter (Signed)
Insurance denied prior authorization for Hyoscyamine 0.125

## 2014-05-28 NOTE — Telephone Encounter (Signed)
Please call patient and ask her if she has ever tried [place] for constipation

## 2014-05-28 NOTE — Telephone Encounter (Signed)
Patient has not tried [place] before

## 2014-05-28 NOTE — Telephone Encounter (Signed)
Amitiza  

## 2014-05-28 NOTE — Telephone Encounter (Signed)
What will they pay for? 

## 2014-05-28 NOTE — Telephone Encounter (Signed)
amitiza sent to [employer]

## 2014-06-06 DIAGNOSIS — K580 Irritable bowel syndrome with diarrhea: Secondary | ICD-10-CM | POA: Diagnosis not present

## 2014-06-06 DIAGNOSIS — K219 Gastro-esophageal reflux disease without esophagitis: Secondary | ICD-10-CM | POA: Diagnosis not present

## 2014-06-28 ENCOUNTER — Telehealth: Payer: Self-pay | Admitting: Nurse Practitioner

## 2014-06-28 ENCOUNTER — Other Ambulatory Visit: Payer: Self-pay

## 2014-06-28 DIAGNOSIS — M5136 Other intervertebral disc degeneration, lumbar region: Secondary | ICD-10-CM

## 2014-06-28 MED ORDER — TRAMADOL HCL 50 MG PO TABS: 50.0000 mg | ORAL_TABLET | Freq: Two times a day (BID) | ORAL | Status: DC

## 2014-06-28 NOTE — Telephone Encounter (Signed)
Last seen 05/17/14 MMM If approved print

## 2014-06-28 NOTE — Telephone Encounter (Signed)
Left detailed message on patient's voicemail that script can be picked up with photo ID

## 2014-06-28 NOTE — Telephone Encounter (Signed)
RX ready for pick up 

## 2014-08-01 ENCOUNTER — Telehealth: Payer: Self-pay | Admitting: Nurse Practitioner

## 2014-08-01 DIAGNOSIS — M5136 Other intervertebral disc degeneration, lumbar region: Secondary | ICD-10-CM

## 2014-08-01 MED ORDER — TRAMADOL HCL 50 MG PO TABS: 50.0000 mg | ORAL_TABLET | Freq: Two times a day (BID) | ORAL | Status: DC

## 2014-08-01 NOTE — Telephone Encounter (Signed)
Ultram rx ready for pick up  

## 2014-08-01 NOTE — Telephone Encounter (Signed)
Patient aware that rx is ready to be picked up.  

## 2014-08-05 ENCOUNTER — Other Ambulatory Visit: Payer: Self-pay | Admitting: Nurse Practitioner

## 2014-09-02 ENCOUNTER — Telehealth: Payer: Self-pay | Admitting: Nurse Practitioner

## 2014-09-02 DIAGNOSIS — M5136 Other intervertebral disc degeneration, lumbar region: Secondary | ICD-10-CM

## 2014-09-02 DIAGNOSIS — Z1231 Encounter for screening mammogram for malignant neoplasm of breast: Secondary | ICD-10-CM | POA: Diagnosis not present

## 2014-09-02 MED ORDER — TRAMADOL HCL 50 MG PO TABS: 50.0000 mg | ORAL_TABLET | Freq: Two times a day (BID) | ORAL | Status: DC

## 2014-09-02 NOTE — Telephone Encounter (Signed)
Detailed message left for patient that rx is ready to be picked up 

## 2014-09-02 NOTE — Telephone Encounter (Signed)
Tramadol rx ready for pick up  

## 2014-09-23 ENCOUNTER — Encounter: Payer: Self-pay | Admitting: *Deleted

## 2014-10-07 ENCOUNTER — Other Ambulatory Visit: Payer: Self-pay | Admitting: Nurse Practitioner

## 2014-10-07 DIAGNOSIS — M5136 Other intervertebral disc degeneration, lumbar region: Secondary | ICD-10-CM

## 2014-10-07 MED ORDER — TRAMADOL HCL 50 MG PO TABS: 50.0000 mg | ORAL_TABLET | Freq: Two times a day (BID) | ORAL | Status: DC

## 2014-10-07 NOTE — Telephone Encounter (Signed)
Tramadol rx ready for pick up

## 2014-10-07 NOTE — Telephone Encounter (Signed)
Pt aware written Rx is at front desk ready for pickup  

## 2014-10-18 ENCOUNTER — Ambulatory Visit (INDEPENDENT_AMBULATORY_CARE_PROVIDER_SITE_OTHER): Payer: Commercial Managed Care - HMO

## 2014-10-18 DIAGNOSIS — Z23 Encounter for immunization: Secondary | ICD-10-CM | POA: Diagnosis not present

## 2014-11-07 ENCOUNTER — Telehealth: Payer: Self-pay | Admitting: Nurse Practitioner

## 2014-11-07 DIAGNOSIS — M5136 Other intervertebral disc degeneration, lumbar region: Secondary | ICD-10-CM

## 2014-11-08 ENCOUNTER — Other Ambulatory Visit: Payer: Self-pay | Admitting: *Deleted

## 2014-11-08 MED ORDER — CYCLOBENZAPRINE HCL 10 MG PO TABS: ORAL_TABLET | ORAL | Status: DC

## 2014-11-08 MED ORDER — TRAMADOL HCL 50 MG PO TABS: 50.0000 mg | ORAL_TABLET | Freq: Two times a day (BID) | ORAL | Status: DC

## 2014-11-08 NOTE — Telephone Encounter (Signed)
Tramadol rx ready for pick up

## 2014-11-08 NOTE — Telephone Encounter (Signed)
Left message stating that Rx is ready for pick up at the office

## 2014-11-22 ENCOUNTER — Ambulatory Visit: Payer: Commercial Managed Care - HMO | Admitting: Nurse Practitioner

## 2014-11-25 ENCOUNTER — Ambulatory Visit: Payer: Commercial Managed Care - HMO | Admitting: Nurse Practitioner

## 2014-11-29 ENCOUNTER — Encounter: Payer: Self-pay | Admitting: Nurse Practitioner

## 2014-11-29 ENCOUNTER — Telehealth: Payer: Self-pay | Admitting: Nurse Practitioner

## 2014-11-29 ENCOUNTER — Ambulatory Visit (INDEPENDENT_AMBULATORY_CARE_PROVIDER_SITE_OTHER): Payer: Commercial Managed Care - HMO | Admitting: Nurse Practitioner

## 2014-11-29 ENCOUNTER — Ambulatory Visit (INDEPENDENT_AMBULATORY_CARE_PROVIDER_SITE_OTHER): Payer: Commercial Managed Care - HMO

## 2014-11-29 VITALS — BP 133/81 | HR 99 | Temp 96.7°F | Ht 61.0 in | Wt 138.0 lb

## 2014-11-29 DIAGNOSIS — K219 Gastro-esophageal reflux disease without esophagitis: Secondary | ICD-10-CM

## 2014-11-29 DIAGNOSIS — Z6826 Body mass index (BMI) 26.0-26.9, adult: Secondary | ICD-10-CM | POA: Diagnosis not present

## 2014-11-29 DIAGNOSIS — Z1159 Encounter for screening for other viral diseases: Secondary | ICD-10-CM

## 2014-11-29 DIAGNOSIS — K5730 Diverticulosis of large intestine without perforation or abscess without bleeding: Secondary | ICD-10-CM | POA: Diagnosis not present

## 2014-11-29 DIAGNOSIS — M7732 Calcaneal spur, left foot: Secondary | ICD-10-CM

## 2014-11-29 DIAGNOSIS — M8580 Other specified disorders of bone density and structure, unspecified site: Secondary | ICD-10-CM

## 2014-11-29 DIAGNOSIS — E785 Hyperlipidemia, unspecified: Secondary | ICD-10-CM

## 2014-11-29 DIAGNOSIS — M51369 Other intervertebral disc degeneration, lumbar region without mention of lumbar back pain or lower extremity pain: Secondary | ICD-10-CM

## 2014-11-29 DIAGNOSIS — F411 Generalized anxiety disorder: Secondary | ICD-10-CM | POA: Diagnosis not present

## 2014-11-29 DIAGNOSIS — I10 Essential (primary) hypertension: Secondary | ICD-10-CM

## 2014-11-29 DIAGNOSIS — M5136 Other intervertebral disc degeneration, lumbar region: Secondary | ICD-10-CM | POA: Diagnosis not present

## 2014-11-29 DIAGNOSIS — M79672 Pain in left foot: Secondary | ICD-10-CM

## 2014-11-29 DIAGNOSIS — Z6827 Body mass index (BMI) 27.0-27.9, adult: Secondary | ICD-10-CM | POA: Insufficient documentation

## 2014-11-29 MED ORDER — HYOSCYAMINE SULFATE 0.125 MG SL SUBL: 0.1250 mg | SUBLINGUAL_TABLET | Freq: Four times a day (QID) | SUBLINGUAL | Status: DC | PRN

## 2014-11-29 MED ORDER — SIMVASTATIN 20 MG PO TABS: ORAL_TABLET | ORAL | Status: DC

## 2014-11-29 MED ORDER — MELOXICAM 15 MG PO TABS: 15.0000 mg | ORAL_TABLET | Freq: Every day | ORAL | Status: DC

## 2014-11-29 MED ORDER — TRAMADOL HCL 50 MG PO TABS: 50.0000 mg | ORAL_TABLET | Freq: Two times a day (BID) | ORAL | Status: DC

## 2014-11-29 MED ORDER — LUBIPROSTONE 8 MCG PO CAPS: 8.0000 ug | ORAL_CAPSULE | Freq: Two times a day (BID) | ORAL | Status: DC

## 2014-11-29 MED ORDER — ENALAPRIL MALEATE 20 MG PO TABS: ORAL_TABLET | ORAL | Status: DC

## 2014-11-29 MED ORDER — OMEPRAZOLE 40 MG PO CPDR: DELAYED_RELEASE_CAPSULE | ORAL | Status: DC

## 2014-11-29 MED ORDER — HYDROCHLOROTHIAZIDE 25 MG PO TABS: 25.0000 mg | ORAL_TABLET | Freq: Every day | ORAL | Status: DC

## 2014-11-29 NOTE — Addendum Note (Signed)
Addended by: Chevis Pretty on: 11/29/2014 12:37 PM   Modules accepted: Orders

## 2014-11-29 NOTE — Patient Instructions (Signed)
Heel Spur  A heel spur is a bony growth that forms on the bottom of your heel bone (calcaneus). Heel spurs are common and do not always cause pain. However, heel spurs often cause inflammation in the strong band of tissue that runs underneath the bone of your foot (plantar fascia). When this happens, you may feel pain on the bottom of your foot, near your heel.   CAUSES   The cause of heel spurs is not completely understood. They may be caused by pressure on the heel. Or, they may stem from the muscle attachments (tendons) near the spur pulling on the heel.   RISK FACTORS  You may be at risk for a heel spur if you:  · Are older than 40.  · Are overweight.  · Have wear and tear arthritis (osteoarthritis).  · Have plantar fascia inflammation.  SIGNS AND SYMPTOMS   Some people have heel spurs but no symptoms. If you do have symptoms, they may include:   · Pain in the bottom of your heel.  · Pain that is worse when you first get out of bed.  · Pain that gets worse after walking or standing.  DIAGNOSIS   Your health care provider may diagnose a heel spur based on your symptoms and a physical exam. You may also have an X-ray of your foot to check for a bony growth coming from the calcaneus.   TREATMENT  Treatment aims to relieve the pain from the heel spur. This may include:  · Stretching exercises.  · Losing weight.  · Wearing specific shoes, inserts, or orthotics for comfort and support.  · Wearing splints at night to properly position your feet.  · Taking over-the-counter medicine to relieve pain.  · Being treated with high-intensity sound waves to break up the heel spur (extracorporeal shock wave therapy).  · Getting steroid injections in your heel to reduce swelling and ease pain.  · Having surgery if your heel spur causes long-term (chronic) pain.  HOME CARE INSTRUCTIONS   · Take medicines only as directed by your health care provider.  · Ask your health care provider if you should use ice or cold packs on the  painful areas of your heel or foot.  · Avoid activities that cause you pain until you recover or as directed by your health care provider.  · Stretch before exercising or being physically active.  · Wear supportive shoes that fit well as directed by your health care provider. You might need to buy new shoes. Wearing old shoes or shoes that do not fit correctly may not provide the support that you need.  · Lose weight if your health care provider thinks you should. This can relieve pressure on your foot that may be causing pain and discomfort.  SEEK MEDICAL CARE IF:   · Your pain continues or gets worse.     This information is not intended to replace advice given to you by your health care provider. Make sure you discuss any questions you have with your health care provider.     Document Released: 01/27/2005 Document Revised: 01/11/2014 Document Reviewed: 02/21/2013  [employer]vier Interactive Patient Education ©2016 [employer]

## 2014-11-29 NOTE — Progress Notes (Signed)
Subjective:    Patient ID: Maria Moss, female    DOB: 09-23-1950, 64 y.o.   MRN: [id number]  Patient is here today for follow up of chronic medical problems.   Hypertension This is a chronic problem. The current episode started more than 1 year ago. The problem is unchanged. The problem is uncontrolled. Associated symptoms include anxiety. Pertinent negatives include no chest pain, palpitations or shortness of breath. Risk factors for coronary artery disease include diabetes mellitus, dyslipidemia, post-menopausal state and sedentary lifestyle. Past treatments include diuretics and ACE inhibitors. The current treatment provides moderate improvement. Compliance problems include diet and exercise.   Hyperlipidemia This is a chronic problem. The current episode started more than 1 year ago. Recent lipid tests were reviewed and are variable. She has no history of diabetes, hypothyroidism or obesity. Pertinent negatives include no chest pain or shortness of breath. Current antihyperlipidemic treatment includes statins. The current treatment provides moderate improvement of lipids. Compliance problems include adherence to diet and adherence to exercise.  Risk factors for coronary artery disease include dyslipidemia, hypertension and post-menopausal.  Anxiety Presents for follow-up visit. Patient reports no chest pain, palpitations or shortness of breath. Symptoms occur most days. The severity of symptoms is moderate. Nothing aggravates the symptoms. The quality of sleep is good. Nighttime awakenings: none.    IBS/diverticulosis Use levsin when needed for cramping- uses PRN. She is seeing Dr. Carlton Adam. No recent flare ups GERD Omeprazole daily- keeps symptoms under control DDD Was diagnosed by S. Weeks and has been taking ultram for- she says that the pain is worsening in her lower back. Pain radiates down her left leg- no weakness that she has noted. osteopenia No c/o back pain- very little  exercise  Review of Systems  Constitutional: Negative.   HENT: Negative.   Respiratory: Negative for shortness of breath.   Cardiovascular: Negative for chest pain and palpitations.  Endocrine: Negative.   Musculoskeletal: Positive for back pain.  Neurological: Negative.   Hematological: Negative.   All other systems reviewed and are negative.      Objective:   Physical Exam  Constitutional: She is oriented to person, place, and time. She appears well-developed and well-nourished.  HENT:  Head: Normocephalic.  Right Ear: Hearing, tympanic membrane, external ear and ear canal normal.  Left Ear: Hearing, tympanic membrane, external ear and ear canal normal.  Nose: Nose normal.  Mouth/Throat: Uvula is midline and oropharynx is clear and moist.  Eyes: Conjunctivae and EOM are normal. Pupils are equal, round, and reactive to light.  Neck: Normal range of motion and full passive range of motion without pain. Neck supple. No JVD present. Carotid bruit is not present. No thyroid mass and no thyromegaly present.  Cardiovascular: Normal rate, regular rhythm, normal heart sounds and intact distal pulses.   No murmur heard. Pulmonary/Chest: Effort normal and breath sounds normal.  Abdominal: Soft. Bowel sounds are normal. She exhibits no distension and no mass. There is no tenderness.  Genitourinary: No breast swelling, tenderness, discharge or bleeding.  Musculoskeletal: Normal range of motion. She exhibits no edema.  Pain on palpation of left heel  Lymphadenopathy:    She has no cervical adenopathy.  Neurological: She is alert and oriented to person, place, and time.  Skin: Skin is warm and dry.  Psychiatric: She has a normal mood and affect. Her behavior is normal. Judgment and thought content normal.  Vitals reviewed.  BP 133/81 mmHg  Pulse 99  Temp(Src) 96.7 F (35.9 C) (Oral)  Ht _0  (1.549 m)  Wt 138 lb (62.596 kg)  BMI 26.09 kg/m2   Left foot xray- small heel  spur-Preliminary reading by Ronnald Collum, FNP  [facility]      Assessment & Plan:   1. Essential hypertension Do not add salt to diet - enalapril (VASOTEC) 20 MG tablet; TAKE 1 AND 1/2 TABLETS EVERY DAY (PATIENT WILL GET FURTHER REFILLS AT APPOINTMENT 02/2014)  Dispense: 135 tablet; Refill: 1 - hydrochlorothiazide (HYDRODIURIL) 25 MG tablet; Take 1 tablet (25 mg total) by mouth daily.  Dispense: 90 tablet; Refill: 1 - CMP14+EGFR  2. Diverticulosis of large intestine without hemorrhage Keep follow up appointment with GI specialist - lubiprostone (AMITIZA) 8 MCG capsule; Take 1 capsule (8 mcg total) by mouth 2 (two) times daily with a meal.  Dispense: 60 capsule; Refill: 2 - hyoscyamine (LEVSIN SL) 0.125 MG SL tablet; Take 1 tablet (0.125 mg total) by mouth every 6 (six) hours as needed.  Dispense: 30 tablet; Refill: 5  3. Hyperlipemia Low fta diet - simvastatin (ZOCOR) 20 MG tablet; TAKE 1 TABLET AT BEDTIME (PATIENT WILL GET FURTHER REFILLS AT APPOINTMENT 02/2014)  Dispense: 90 tablet; Refill: 1 - Lipid panel  4. GAD (generalized anxiety disorder) Stress management  5. Degenerative disc disease, lumbar  6. Osteopenia Weight bearing exercises  7. BMI 26.0-26.9,adult Discussed diet and exercise for person with BMI >25 Will recheck weight in 3-6 months   8. Gastroesophageal reflux disease without esophagitis.mmmge - omeprazole (PRILOSEC) 40 MG capsule; TAKE 1 CAPSULE EVERY DAY (PATIENT WILL GET FURTHER REFILLS AT APPOINTMENT 02/2014)  Dispense: 90 capsule; Refill: 1  9. Need for hepatitis C screening test - Hepatitis C antibody  10. Heel pain, left - DG Foot Complete Left; Future  11. Heel spur, left Shoe gel inserts mobic 24m daily    Labs pending Health maintenance reviewed Diet and exercise encouraged Continue all meds Follow up  In 6 months   [place] FNP

## 2014-11-30 LAB — CMP14+EGFR
A/G RATIO: 2 (ref 1.1–2.5)
ALT: 23 IU/L (ref 0–32)
AST: 26 IU/L (ref 0–40)
Albumin: 4.6 g/dL (ref 3.6–4.8)
Alkaline Phosphatase: 136 IU/L — ABNORMAL HIGH (ref 39–117)
BUN/Creatinine Ratio: 22 (ref 11–26)
BUN: 19 mg/dL (ref 8–27)
Bilirubin Total: 0.8 mg/dL (ref 0.0–1.2)
CALCIUM: 10.4 mg/dL — AB (ref 8.7–10.3)
CO2: 25 mmol/L (ref 18–29)
CREATININE: 0.86 mg/dL (ref 0.57–1.00)
Chloride: 99 mmol/L (ref 97–106)
GFR, EST AFRICAN AMERICAN: 84 mL/min/{1.73_m2} (ref >59)
GFR, EST NON AFRICAN AMERICAN: 73 mL/min/{1.73_m2} (ref >59)
GLOBULIN, TOTAL: 2.3 g/dL (ref 1.5–4.5)
Glucose: 96 mg/dL (ref 65–99)
POTASSIUM: 4.6 mmol/L (ref 3.5–5.2)
SODIUM: 140 mmol/L (ref 136–144)
TOTAL PROTEIN: 6.9 g/dL (ref 6.0–8.5)

## 2014-11-30 LAB — LIPID PANEL
CHOL/HDL RATIO: 2.3 ratio (ref 0.0–4.4)
Cholesterol, Total: 163 mg/dL (ref 100–199)
HDL: 71 mg/dL (ref >39)
LDL CALC: 67 mg/dL (ref 0–99)
Triglycerides: 123 mg/dL (ref 0–149)
VLDL Cholesterol Cal: 25 mg/dL (ref 5–40)

## 2014-11-30 LAB — HEPATITIS C ANTIBODY

## 2014-12-02 MED ORDER — HYDROCHLOROTHIAZIDE 25 MG PO TABS: 25.0000 mg | ORAL_TABLET | Freq: Every day | ORAL | Status: DC

## 2014-12-02 MED ORDER — LUBIPROSTONE 8 MCG PO CAPS: 8.0000 ug | ORAL_CAPSULE | Freq: Two times a day (BID) | ORAL | Status: DC

## 2014-12-02 MED ORDER — ALPRAZOLAM 0.5 MG PO TABS: ORAL_TABLET | ORAL | Status: DC

## 2014-12-02 MED ORDER — ENALAPRIL MALEATE 20 MG PO TABS: ORAL_TABLET | ORAL | Status: DC

## 2014-12-02 MED ORDER — MELOXICAM 15 MG PO TABS: 15.0000 mg | ORAL_TABLET | Freq: Every day | ORAL | Status: DC

## 2014-12-02 MED ORDER — HYOSCYAMINE SULFATE 0.125 MG SL SUBL
0.1250 mg | SUBLINGUAL_TABLET | Freq: Four times a day (QID) | SUBLINGUAL | Status: DC | PRN
Start: 2014-12-02 — End: 2015-10-17

## 2014-12-02 MED ORDER — OMEPRAZOLE 40 MG PO CPDR: DELAYED_RELEASE_CAPSULE | ORAL | Status: DC

## 2014-12-02 MED ORDER — SIMVASTATIN 20 MG PO TABS: ORAL_TABLET | ORAL | Status: DC

## 2014-12-02 NOTE — Telephone Encounter (Signed)
Patient is requesting a refill on her alprazolam. She is requesting it printed to send to mail order

## 2014-12-02 NOTE — Telephone Encounter (Signed)
Pt aware rx ready for pick up. 

## 2014-12-02 NOTE — Telephone Encounter (Signed)
rx corrected Xanax rx ready for pick up

## 2014-12-05 ENCOUNTER — Telehealth: Payer: Self-pay

## 2014-12-05 NOTE — Telephone Encounter (Signed)
Insurance prior authorized Hyoscyamine Sulfate

## 2015-02-10 ENCOUNTER — Other Ambulatory Visit: Payer: Self-pay | Admitting: Nurse Practitioner

## 2015-02-10 DIAGNOSIS — M5136 Other intervertebral disc degeneration, lumbar region: Secondary | ICD-10-CM

## 2015-02-10 MED ORDER — TRAMADOL HCL 50 MG PO TABS: 50.0000 mg | ORAL_TABLET | Freq: Two times a day (BID) | ORAL | Status: DC

## 2015-02-10 NOTE — Telephone Encounter (Signed)
Pt is aware that rx is ready for pick up and that she will need to schedule an appt specifically for pain management. Pt voiced understanding.

## 2015-02-10 NOTE — Telephone Encounter (Signed)
Tramadol rx ready for pick up Patient needs to be seen for pain management appointment only please

## 2015-02-25 ENCOUNTER — Other Ambulatory Visit: Payer: Self-pay | Admitting: Nurse Practitioner

## 2015-02-28 ENCOUNTER — Other Ambulatory Visit: Payer: Self-pay | Admitting: Nurse Practitioner

## 2015-03-06 DIAGNOSIS — K580 Irritable bowel syndrome with diarrhea: Secondary | ICD-10-CM | POA: Diagnosis not present

## 2015-03-06 DIAGNOSIS — K219 Gastro-esophageal reflux disease without esophagitis: Secondary | ICD-10-CM | POA: Diagnosis not present

## 2015-03-06 DIAGNOSIS — Z79891 Long term (current) use of opiate analgesic: Secondary | ICD-10-CM | POA: Diagnosis not present

## 2015-03-06 DIAGNOSIS — Z9049 Acquired absence of other specified parts of digestive tract: Secondary | ICD-10-CM | POA: Diagnosis not present

## 2015-03-06 DIAGNOSIS — Z79899 Other long term (current) drug therapy: Secondary | ICD-10-CM | POA: Diagnosis not present

## 2015-03-21 ENCOUNTER — Ambulatory Visit (INDEPENDENT_AMBULATORY_CARE_PROVIDER_SITE_OTHER): Payer: Commercial Managed Care - HMO | Admitting: Nurse Practitioner

## 2015-03-21 ENCOUNTER — Encounter: Payer: Self-pay | Admitting: Nurse Practitioner

## 2015-03-21 VITALS — BP 150/80 | HR 102 | Temp 97.4°F | Ht 61.0 in | Wt 146.0 lb

## 2015-03-21 DIAGNOSIS — E785 Hyperlipidemia, unspecified: Secondary | ICD-10-CM

## 2015-03-21 DIAGNOSIS — M8580 Other specified disorders of bone density and structure, unspecified site: Secondary | ICD-10-CM

## 2015-03-21 DIAGNOSIS — I10 Essential (primary) hypertension: Secondary | ICD-10-CM | POA: Diagnosis not present

## 2015-03-21 DIAGNOSIS — F411 Generalized anxiety disorder: Secondary | ICD-10-CM

## 2015-03-21 DIAGNOSIS — Z6827 Body mass index (BMI) 27.0-27.9, adult: Secondary | ICD-10-CM

## 2015-03-21 DIAGNOSIS — K5730 Diverticulosis of large intestine without perforation or abscess without bleeding: Secondary | ICD-10-CM

## 2015-03-21 DIAGNOSIS — M5136 Other intervertebral disc degeneration, lumbar region: Secondary | ICD-10-CM | POA: Diagnosis not present

## 2015-03-21 MED ORDER — TRAMADOL HCL 50 MG PO TABS: 50.0000 mg | ORAL_TABLET | Freq: Two times a day (BID) | ORAL | Status: DC

## 2015-03-21 NOTE — Patient Instructions (Signed)

## 2015-03-21 NOTE — Progress Notes (Signed)
Subjective:    Patient ID: [name], female    DOB: [birth year], 65 y.o.   MRN: [id number]  Patient is here today for follow up of chronic medical problems. Having some lower back pain. Takes tramadol twice a day which helps some but not that much. Takes Mobic and sometimes a goody powder will help a little.  Went to [facility] for stomach issues and they want her to take Xifaxan abx but it is very expensive. Trying to get approved by insurance. Current Outpatient Prescriptions on File Prior to Visit  Medication Sig Dispense Refill  . ALPRAZolam (XANAX) 0.5 MG tablet TAKE 1 TABLET BY MOUTH AT BEDTIME AS NEEDED FOR SLEEP 90 tablet 0  . cyclobenzaprine (FLEXERIL) 10 MG tablet TAKE 1 TABLET BY MOUTH THREE TIMES DAILY AS NEEDED FOR MUSCLE SPASMS 90 tablet 0  . enalapril (VASOTEC) 20 MG tablet TAKE 1 AND 1/2 TABLETS EVERY DAY (PATIENT WILL GET FURTHER REFILLS AT APPOINTMENT 02/2014) 135 tablet 1  . fluticasone (FLONASE) 50 MCG/ACT nasal spray USE 2 SPRAYS IN EACH NOSTRIL EVERY DAY 48 g 2  . hydrochlorothiazide (HYDRODIURIL) 25 MG tablet Take 1 tablet (25 mg total) by mouth daily. 90 tablet 1  . hyoscyamine (LEVSIN SL) 0.125 MG SL tablet Take 1 tablet (0.125 mg total) by mouth every 6 (six) hours as needed. 90 tablet 1  . loratadine (CLARITIN) 10 MG tablet Take 10 mg by mouth daily.    . meloxicam (MOBIC) 15 MG tablet Take 1 tablet (15 mg total) by mouth daily. 30 tablet 3  . omeprazole (PRILOSEC) 40 MG capsule TAKE 1 CAPSULE EVERY DAY (PATIENT WILL GET FURTHER REFILLS AT APPOINTMENT 02/2014) 90 capsule 1  . Probiotic Product (PROBIOTIC & ACIDOPHILUS EX ST PO) Take by mouth.    . simvastatin (ZOCOR) 20 MG tablet TAKE 1 TABLET AT BEDTIME (PATIENT WILL GET FURTHER REFILLS AT APPOINTMENT 02/2014) 90 tablet 1  . traMADol (ULTRAM) 50 MG tablet Take 1 tablet (50 mg total) by mouth 2 (two) times daily. 60 tablet 0   No current facility-administered medications on file prior to visit.    Hypertension This  is a chronic problem. The current episode started more than 1 year ago. The problem is unchanged. The problem is uncontrolled. Associated symptoms include anxiety. Pertinent negatives include no chest pain, palpitations or shortness of breath. Risk factors for coronary artery disease include diabetes mellitus, dyslipidemia, post-menopausal state and sedentary lifestyle. Past treatments include diuretics and ACE inhibitors. The current treatment provides moderate improvement. Compliance problems include diet and exercise.   Hyperlipidemia This is a chronic problem. The current episode started more than 1 year ago. Recent lipid tests were reviewed and are variable. She has no history of diabetes, hypothyroidism or obesity. Pertinent negatives include no chest pain or shortness of breath. Current antihyperlipidemic treatment includes statins. The current treatment provides moderate improvement of lipids. Compliance problems include adherence to diet and adherence to exercise.  Risk factors for coronary artery disease include dyslipidemia, hypertension and post-menopausal.  Anxiety Presents for follow-up visit. Patient reports no chest pain, palpitations or shortness of breath. Symptoms occur most days. The severity of symptoms is moderate. Nothing aggravates the symptoms. The quality of sleep is good. Nighttime awakenings: none.    IBS/diverticulosis Use levsin when needed for cramping- uses PRN. She is seeing Dr. Carlton Adam. No recent flare ups GERD Omeprazole daily- keeps symptoms under control DDD Was diagnosed by S. Weeks and has been taking ultram for- she says that the  pain is worsening in her lower back. Pain radiates down her left leg- no weakness that she has noted. Osteopenia Still is having some lower back pain.  Review of Systems  Constitutional: Negative.   HENT: Negative.   Eyes: Negative.   Respiratory: Negative.  Negative for shortness of breath.   Cardiovascular: Negative.  Negative  for chest pain and palpitations.  Gastrointestinal: Negative.   Endocrine: Negative.   Genitourinary: Negative.   Musculoskeletal: Negative.   Skin: Negative.   Allergic/Immunologic: Negative.   Neurological: Negative.   Hematological: Negative.   Psychiatric/Behavioral: Negative.   All other systems reviewed and are negative.      Objective:   Physical Exam  Constitutional: She is oriented to person, place, and time. She appears well-developed and well-nourished.  HENT:  Head: Normocephalic.  Right Ear: Hearing, tympanic membrane, external ear and ear canal normal.  Left Ear: Hearing, tympanic membrane, external ear and ear canal normal.  Nose: Nose normal.  Mouth/Throat: Uvula is midline and oropharynx is clear and moist.  Eyes: Conjunctivae and EOM are normal. Pupils are equal, round, and reactive to light.  Neck: Normal range of motion and full passive range of motion without pain. Neck supple. No JVD present. Carotid bruit is not present. No thyroid mass and no thyromegaly present.  Cardiovascular: Normal rate, regular rhythm, normal heart sounds and intact distal pulses.   No murmur heard. Pulmonary/Chest: Effort normal and breath sounds normal.  Abdominal: Soft. Bowel sounds are normal. She exhibits no distension and no mass. There is no tenderness.  Genitourinary: No breast swelling, tenderness, discharge or bleeding.  Musculoskeletal: Normal range of motion. She exhibits no edema.  Lymphadenopathy:    She has no cervical adenopathy.  Neurological: She is alert and oriented to person, place, and time.  Skin: Skin is warm and dry.  Psychiatric: She has a normal mood and affect. Her behavior is normal. Judgment and thought content normal.  Vitals reviewed.  BP 150/80 mmHg  Pulse 102  Temp(Src) 97.4 F (36.3 C) (Oral)  Ht _0  (1.549 m)  Wt 146 lb (66.225 kg)  BMI 27.60 kg/m2           Assessment & Plan:   1. Essential hypertension No salt in diet   2.  Diverticulosis of large intestine without hemorrhage Continue to follow-up with GI  Avoid aggravating foods  3. Degenerative disc disease, lumbar Make appointment for chronic pain management - traMADol (ULTRAM) 50 MG tablet; Take 1 tablet (50 mg total) by mouth 2 (two) times daily.  Dispense: 60 tablet; Refill: 0  4. Osteopenia    5. Hyperlipemia Low fat diet    6. GAD (generalized anxiety disorder)  7. BMI 27.0-27.9,adult Low fat diet  Orders Placed This Encounter  Procedures  . CMP14+EGFR  . Lipid panel    Continue all meds Labs pending Health Maintenance reviewed Diet and exercise encouraged RTO 6 months  Rosalio Loud FNP Student  Janice-Margaret Hassell Done, FNP

## 2015-03-22 LAB — CMP14+EGFR
ALBUMIN: 4.3 g/dL (ref 3.6–4.8)
ALK PHOS: 127 IU/L — AB (ref 39–117)
ALT: 21 IU/L (ref 0–32)
AST: 25 IU/L (ref 0–40)
Albumin/Globulin Ratio: 1.7 (ref 1.2–2.2)
BUN / CREAT RATIO: 28 — AB (ref 11–26)
BUN: 22 mg/dL (ref 8–27)
Bilirubin Total: 0.6 mg/dL (ref 0.0–1.2)
CO2: 26 mmol/L (ref 18–29)
CREATININE: 0.78 mg/dL (ref 0.57–1.00)
Calcium: 10.1 mg/dL (ref 8.7–10.3)
Chloride: 99 mmol/L (ref 96–106)
GFR calc Af Amer: 94 mL/min/{1.73_m2} (ref >59)
GFR, EST NON AFRICAN AMERICAN: 81 mL/min/{1.73_m2} (ref >59)
GLOBULIN, TOTAL: 2.5 g/dL (ref 1.5–4.5)
Glucose: 90 mg/dL (ref 65–99)
Potassium: 4.4 mmol/L (ref 3.5–5.2)
SODIUM: 142 mmol/L (ref 134–144)
Total Protein: 6.8 g/dL (ref 6.0–8.5)

## 2015-03-22 LAB — LIPID PANEL
CHOL/HDL RATIO: 2.1 ratio (ref 0.0–4.4)
Cholesterol, Total: 158 mg/dL (ref 100–199)
HDL: 76 mg/dL (ref >39)
LDL CALC: 51 mg/dL (ref 0–99)
Triglycerides: 153 mg/dL — ABNORMAL HIGH (ref 0–149)
VLDL Cholesterol Cal: 31 mg/dL (ref 5–40)

## 2015-04-11 ENCOUNTER — Encounter (INDEPENDENT_AMBULATORY_CARE_PROVIDER_SITE_OTHER): Payer: Self-pay

## 2015-04-11 ENCOUNTER — Encounter: Payer: Self-pay | Admitting: Nurse Practitioner

## 2015-04-11 ENCOUNTER — Other Ambulatory Visit: Payer: Self-pay | Admitting: Nurse Practitioner

## 2015-04-11 ENCOUNTER — Ambulatory Visit (INDEPENDENT_AMBULATORY_CARE_PROVIDER_SITE_OTHER): Payer: Commercial Managed Care - HMO | Admitting: Nurse Practitioner

## 2015-04-11 VITALS — BP 156/88 | HR 78 | Temp 96.6°F | Ht 61.0 in | Wt 144.0 lb

## 2015-04-11 DIAGNOSIS — M545 Low back pain, unspecified: Secondary | ICD-10-CM | POA: Insufficient documentation

## 2015-04-11 DIAGNOSIS — G8929 Other chronic pain: Secondary | ICD-10-CM

## 2015-04-11 DIAGNOSIS — Z7189 Other specified counseling: Secondary | ICD-10-CM | POA: Insufficient documentation

## 2015-04-11 DIAGNOSIS — M5136 Other intervertebral disc degeneration, lumbar region: Secondary | ICD-10-CM

## 2015-04-11 MED ORDER — TRAMADOL HCL 50 MG PO TABS: 50.0000 mg | ORAL_TABLET | Freq: Two times a day (BID) | ORAL | Status: DC

## 2015-04-11 NOTE — Progress Notes (Signed)
Subjective:    Patient ID: Maria Moss, female    DOB: 03-07-1950, 65 y.o.   MRN: [id number]  HPI  [facility] Controlled Substance Abuse database reviewed- Yes  Depression screen [facility] 2/9 04/11/2015 03/21/2015 05/17/2014 02/15/2014 11/19/2013  Decreased Interest 0 0 0 0 0  Down, Depressed, Hopeless 0 0 0 0 0  PHQ - 2 Score 0 0 0 0 0    GAD 7 : Generalized Anxiety Score 04/11/2015  Nervous, Anxious, on Edge 1  Control/stop worrying 0  Worry too much - different things 0  Trouble relaxing 1  Restless 0  Easily annoyed or irritable 0  Afraid - awful might happen 0  Total GAD 7 Score 2  Anxiety Difficulty Not difficult at all       [employer] drug screen performed- Yes  SOAPP  0= never  1= seldom  2=sometimes  3= often  4= very often  How often do you have mood swings? 0 How often do you smoke a cigarette within an hour after waling up? 0 How often have you taken medication other than the way that it was prescribed?0 How often have you used illegal drugs in the past 5 years? 0 How often, in your lifetime, have you had legal problems or been arrested? 0  Score 0  Alcohol Audit - How often during the last year have found that you: 0-Never   1- Less than monthly   2- Monthly     3-Weekly     4-daily or almost daily  - found that you were not able to stop drinking once you started- 0 -failed to do what was normally expected of you because of drinking- 0 -needed a first drink in the morning- 0 -had a feeling of guilt or remorse after drinking- 0 -are/were unable to remember what happened the night before because of your drinking- 0  0- NO   2- yes but not in last year  4- yes during last year -Have you or someone else been injured because of your drinking- 0 - Has anyone been concerned about your drinking or suggested you cut down- 0        TOTAL- 0  ( 0-7- alcohol education, 8-15- simple advice, 16-19 simple advice plus co0unseling, 20-40 referral for evaluation and  treatment 0   Designated Pharm[facility]- [place]  Pain assessment: Cause of pain- no known cause of pain Pain location- low back pain and bil lower ext pain- was told in past may have fibromyalgia Pain on scale of 1-10- 8/10 Frequency- intermittently in all areas but always has some area hurting What increases pain-cleaning /housework and lots of walking What makes pain Better-- sitting helps some Effects on ADL - no effect other then takes her  Longer to get things done  Prior treatments tried and failed- PT Current treatments- tramadol Morphine mg equivalent- 30  Pain management agreement reviewed and signed- Yes     Review of Systems  Constitutional: Negative.   HENT: Negative.   Respiratory: Negative.   Cardiovascular: Negative.   Musculoskeletal: Positive for back pain.  Neurological: Negative.   Psychiatric/Behavioral: Negative.   All other systems reviewed and are negative.      Objective:   Physical Exam  Constitutional: She is oriented to person, place, and time. She appears well-developed and well-nourished.  Cardiovascular: Normal rate, regular rhythm and normal heart sounds.   Pulmonary/Chest: Effort normal and breath sounds normal.  Musculoskeletal:  FROM of lumbar spine  with pain with all movement but ain does not increase with movement (-) SLR bil  Neurological: She is alert and oriented to person, place, and time.  Skin: Skin is warm.  Psychiatric: She has a normal mood and affect. Her behavior is normal. Judgment and thought content normal.    BP 156/88 mmHg  Pulse 78  Temp(Src) 96.6 F (35.9 C) (Oral)  Ht 5\' 1"  (1.549 m)  Wt 144 lb (65.318 kg)  BMI 27.22 kg/m2       Assessment & Plan:   1. Degenerative disc disease, lumbar   2. Chronic low back pain   3. Encounter for pain management counseling    Meds ordered this encounter  Medications  . traMADol (ULTRAM) 50 MG tablet    Sig: Take 1 tablet (50 mg total) by mouth 2  (two) times daily.    Dispense:  60 tablet    Refill:  0    DO NOT FILL TILL 05/19/15    Order Specific Question:  Supervising Provider    Answer:  Chipper Herb [[id number]]  . traMADol (ULTRAM) 50 MG tablet    Sig: Take 1 tablet (50 mg total) by mouth 2 (two) times daily.    Dispense:  60 tablet    Refill:  0    DO NOT FILL TILL 06/18/15    Order Specific Question:  Supervising Provider    Answer:  Chipper Herb [[id number]]  . traMADol (ULTRAM) 50 MG tablet    Sig: Take 1 tablet (50 mg total) by mouth 2 (two) times daily.    Dispense:  60 tablet    Refill:  0    DO NOT FILL TILL 04/20/15    Order Specific Question:  Supervising Provider    Answer:  Chipper Herb [[id number]]   Pain management contract signed Will follow up with regular follow up Moist heat to areas when hurting RTO prn  Janicia-Margaret Hassell Done, FNP

## 2015-04-17 LAB — TOXASSURE SELECT 13 (MW), URINE: PDF: 0

## 2015-07-04 ENCOUNTER — Encounter: Payer: Self-pay | Admitting: Nurse Practitioner

## 2015-07-04 ENCOUNTER — Ambulatory Visit (INDEPENDENT_AMBULATORY_CARE_PROVIDER_SITE_OTHER): Payer: Commercial Managed Care - HMO | Admitting: Nurse Practitioner

## 2015-07-04 VITALS — BP 137/84 | HR 77 | Temp 97.9°F | Ht 61.0 in | Wt 141.4 lb

## 2015-07-04 DIAGNOSIS — M7732 Calcaneal spur, left foot: Secondary | ICD-10-CM | POA: Diagnosis not present

## 2015-07-04 DIAGNOSIS — J0111 Acute recurrent frontal sinusitis: Secondary | ICD-10-CM

## 2015-07-04 DIAGNOSIS — F411 Generalized anxiety disorder: Secondary | ICD-10-CM | POA: Diagnosis not present

## 2015-07-04 MED ORDER — ALPRAZOLAM 0.5 MG PO TABS: ORAL_TABLET | ORAL | Status: DC

## 2015-07-04 MED ORDER — MELOXICAM 15 MG PO TABS: 15.0000 mg | ORAL_TABLET | Freq: Every day | ORAL | Status: DC

## 2015-07-04 MED ORDER — AZITHROMYCIN 250 MG PO TABS: ORAL_TABLET | ORAL | Status: DC

## 2015-07-04 NOTE — Patient Instructions (Signed)

## 2015-07-04 NOTE — Progress Notes (Signed)
  Subjective:     Maria Moss is a 65 y.o. female who presents for evaluation of sinus pain. Symptoms include: congestion, cough, headaches, post nasal drip, sinus pressure and sore throat. Onset of symptoms was 4 days ago. Symptoms have been gradually worsening since that time. Past history is significant for no history of pneumonia or bronchitis. Patient is a non-smoker. Has been taking tylenol allergy relief but is getting no better. The following portions of the patient's history were reviewed and updated as appropriate: allergies, current medications, past family history, past medical history, past social history, past surgical history and problem list.  Review of Systems Pertinent items are noted in HPI.   Objective:    BP 137/84 mmHg  Pulse 77  Temp(Src) 97.9 F (36.6 C) (Oral)  Ht 5\' 1"  (1.549 m)  Wt 141 lb 6.4 oz (64.139 kg)  BMI 26.73 kg/m2 General appearance: alert and cooperative Eyes: conjunctivae/corneas clear. PERRL, EOM's intact. Fundi benign. Ears: normal TM's and external ear canals both ears Nose: green discharge, mild congestion, turbinates red, sinus tenderness bilateral Throat: lips, mucosa, and tongue normal; teeth and gums normal Neck: no adenopathy, no carotid bruit, no JVD, supple, symmetrical, trachea midline and thyroid not enlarged, symmetric, no tenderness/mass/nodules Lungs: clear to auscultation bilaterally and dry cough Heart: regular rate and rhythm, S1, S2 normal, no murmur, click, rub or gallop    Assessment:    Acute bacterial sinusitis.    Plan:  1. Take meds as prescribed 2. Use a cool mist humidifier especially during the winter months and when heat has been humid. 3. Use saline nose sprays frequently 4. Saline irrigations of the nose can be very helpful if done frequently.  * 4X daily for 1 week*  * Use of a nettie pot can be helpful with this. Follow directions with this* 5. Drink plenty of fluids 6. Keep thermostat turn down low 7.For  any cough or congestion  Use plain Mucinex- regular strength or max strength is fine   * Children- consult with Pharmacist for dosing 8. For fever or aces or pains- take tylenol or ibuprofen appropriate for age and weight.  * for fevers greater than 101 orally you may alternate ibuprofen and tylenol every  3 hours.   Meds ordered this encounter  Medications  . azithromycin (ZITHROMAX Z-PAK) 250 MG tablet    Sig: As directed    Dispense:  1 each    Refill:  0    Order Specific Question:  Supervising Provider    Answer:  Chipper Herb [[id number]]   [place], FNP

## 2015-07-04 NOTE — Addendum Note (Signed)
Addended by: Chevis Pretty on: 07/04/2015 06:10 PM   Modules accepted: Orders, SmartSet

## 2015-07-04 NOTE — Addendum Note (Signed)
Addended by: Chevis Pretty on: 07/04/2015 06:08 PM   Modules accepted: Orders, SmartSet

## 2015-07-17 ENCOUNTER — Telehealth: Payer: Self-pay | Admitting: Nurse Practitioner

## 2015-07-17 MED ORDER — CYCLOBENZAPRINE HCL 10 MG PO TABS: 10.0000 mg | ORAL_TABLET | Freq: Three times a day (TID) | ORAL | Status: DC | PRN

## 2015-07-17 NOTE — Telephone Encounter (Signed)
Pt aware refill has been sent to pharmacy

## 2015-07-17 NOTE — Telephone Encounter (Signed)
Flexeril rx sent to pharmacy

## 2015-07-22 ENCOUNTER — Telehealth: Payer: Self-pay | Admitting: Nurse Practitioner

## 2015-07-22 DIAGNOSIS — M5136 Other intervertebral disc degeneration, lumbar region: Secondary | ICD-10-CM

## 2015-07-22 MED ORDER — TRAMADOL HCL 50 MG PO TABS: 50.0000 mg | ORAL_TABLET | Freq: Two times a day (BID) | ORAL | Status: DC

## 2015-07-22 NOTE — Telephone Encounter (Signed)
Tramadol rx ready for pick up

## 2015-08-21 ENCOUNTER — Other Ambulatory Visit: Payer: Self-pay | Admitting: Nurse Practitioner

## 2015-08-21 ENCOUNTER — Telehealth: Payer: Self-pay | Admitting: Nurse Practitioner

## 2015-08-21 DIAGNOSIS — M5136 Other intervertebral disc degeneration, lumbar region: Secondary | ICD-10-CM

## 2015-08-21 MED ORDER — TRAMADOL HCL 50 MG PO TABS: 50.0000 mg | ORAL_TABLET | Freq: Two times a day (BID) | ORAL | 0 refills | Status: DC

## 2015-08-21 NOTE — Telephone Encounter (Signed)
In the future wil need to be seen rx ready for pick up

## 2015-08-21 NOTE — Telephone Encounter (Signed)
Pt states she was told to follow up in 6 mths She has an appt on 08/29/2015 but will be out of meds before then Please advise

## 2015-08-21 NOTE — Telephone Encounter (Signed)
Patient was told in April that she has to come in and be seen every 3 months to get pain medication refilled

## 2015-08-22 NOTE — Telephone Encounter (Signed)
Patient notified that rx is up front

## 2015-08-29 ENCOUNTER — Encounter: Payer: Self-pay | Admitting: Nurse Practitioner

## 2015-08-29 ENCOUNTER — Telehealth: Payer: Self-pay | Admitting: Nurse Practitioner

## 2015-08-29 ENCOUNTER — Other Ambulatory Visit: Payer: Self-pay

## 2015-08-29 ENCOUNTER — Ambulatory Visit (INDEPENDENT_AMBULATORY_CARE_PROVIDER_SITE_OTHER): Payer: Commercial Managed Care - HMO | Admitting: Nurse Practitioner

## 2015-08-29 VITALS — BP 152/88 | HR 74 | Temp 97.0°F | Ht 61.0 in | Wt 140.0 lb

## 2015-08-29 DIAGNOSIS — E785 Hyperlipidemia, unspecified: Secondary | ICD-10-CM | POA: Diagnosis not present

## 2015-08-29 DIAGNOSIS — M8580 Other specified disorders of bone density and structure, unspecified site: Secondary | ICD-10-CM | POA: Diagnosis not present

## 2015-08-29 DIAGNOSIS — Z6827 Body mass index (BMI) 27.0-27.9, adult: Secondary | ICD-10-CM

## 2015-08-29 DIAGNOSIS — M545 Low back pain, unspecified: Secondary | ICD-10-CM

## 2015-08-29 DIAGNOSIS — I10 Essential (primary) hypertension: Secondary | ICD-10-CM

## 2015-08-29 DIAGNOSIS — G8929 Other chronic pain: Secondary | ICD-10-CM | POA: Diagnosis not present

## 2015-08-29 DIAGNOSIS — F411 Generalized anxiety disorder: Secondary | ICD-10-CM | POA: Diagnosis not present

## 2015-08-29 DIAGNOSIS — K5730 Diverticulosis of large intestine without perforation or abscess without bleeding: Secondary | ICD-10-CM | POA: Diagnosis not present

## 2015-08-29 DIAGNOSIS — K219 Gastro-esophageal reflux disease without esophagitis: Secondary | ICD-10-CM

## 2015-08-29 DIAGNOSIS — M5136 Other intervertebral disc degeneration, lumbar region: Secondary | ICD-10-CM

## 2015-08-29 DIAGNOSIS — Z7189 Other specified counseling: Secondary | ICD-10-CM | POA: Diagnosis not present

## 2015-08-29 LAB — URINALYSIS
Bilirubin, UA: NEGATIVE
GLUCOSE, UA: NEGATIVE
KETONES UA: NEGATIVE
Nitrite, UA: NEGATIVE
PH UA: 6.5 (ref 5.0–7.5)
PROTEIN UA: NEGATIVE
SPEC GRAV UA: 1.015 (ref 1.005–1.030)
Urobilinogen, Ur: 0.2 mg/dL (ref 0.2–1.0)

## 2015-08-29 MED ORDER — TRAMADOL HCL 50 MG PO TABS: 50.0000 mg | ORAL_TABLET | Freq: Two times a day (BID) | ORAL | 0 refills | Status: DC

## 2015-08-29 MED ORDER — OMEPRAZOLE 40 MG PO CPDR: 40.0000 mg | DELAYED_RELEASE_CAPSULE | Freq: Every day | ORAL | 1 refills | Status: DC

## 2015-08-29 MED ORDER — NITROFURANTOIN MONOHYD MACRO 100 MG PO CAPS: 100.0000 mg | ORAL_CAPSULE | Freq: Two times a day (BID) | ORAL | 0 refills | Status: DC

## 2015-08-29 MED ORDER — HYDROCHLOROTHIAZIDE 25 MG PO TABS
25.0000 mg | ORAL_TABLET | Freq: Every day | ORAL | 1 refills | Status: DC
Start: 2015-08-29 — End: 2015-11-28

## 2015-08-29 MED ORDER — ENALAPRIL MALEATE 20 MG PO TABS: 20.0000 mg | ORAL_TABLET | Freq: Every day | ORAL | 1 refills | Status: DC

## 2015-08-29 MED ORDER — ALPRAZOLAM 0.5 MG PO TABS: ORAL_TABLET | ORAL | 1 refills | Status: DC

## 2015-08-29 NOTE — Telephone Encounter (Signed)
Sent to family pharmacy per patients request. Patient notified

## 2015-08-29 NOTE — Addendum Note (Signed)
Addended by: Rolena Infante on: 08/29/2015 11:24 AM   Modules accepted: Orders

## 2015-08-29 NOTE — Progress Notes (Signed)
Subjective:    Patient ID: Maria Moss, female    DOB: 10-Jan-1950, 65 y.o.   MRN: [id number]  Patient is here today for follow up of chronic medical problems. Having some lower back pain. Takes tramadol twice a day which helps some but not that much. Takes Mobic and sometimes a goody powder will help a little.  Went to [facility] for stomach issues and they want her to take Xifaxan abx but it is very expensive. Trying to get approved by insurance. Current Outpatient Prescriptions on File Prior to Visit  Medication Sig Dispense Refill  . ALPRAZolam (XANAX) 0.5 MG tablet TAKE 1 TABLET BY MOUTH AT BEDTIME AS NEEDED FOR SLEEP 60 tablet 0  . cyclobenzaprine (FLEXERIL) 10 MG tablet Take 1 tablet (10 mg total) by mouth 3 (three) times daily as needed. for muscle spams 90 tablet 0  . enalapril (VASOTEC) 20 MG tablet TAKE 1 AND 1/2 TABLETS EVERY DAY  135 tablet 1  . fluticasone (FLONASE) 50 MCG/ACT nasal spray USE 2 SPRAYS IN EACH NOSTRIL EVERY DAY 48 g 2  . hydrochlorothiazide (HYDRODIURIL) 25 MG tablet TAKE 1 TABLET EVERY DAY 90 tablet 1  . hyoscyamine (LEVSIN SL) 0.125 MG SL tablet Take 1 tablet (0.125 mg total) by mouth every 6 (six) hours as needed. 90 tablet 1  . loratadine (CLARITIN) 10 MG tablet Take 10 mg by mouth daily.    . meloxicam (MOBIC) 15 MG tablet Take 1 tablet (15 mg total) by mouth daily. 90 tablet 0  . omeprazole (PRILOSEC) 40 MG capsule TAKE 1 CAPSULE EVERY DAY  90 capsule 1  . Probiotic Product (PROBIOTIC & ACIDOPHILUS EX ST PO) Take by mouth.    . simvastatin (ZOCOR) 20 MG tablet TAKE 1 TABLET AT BEDTIME  90 tablet 1  . traMADol (ULTRAM) 50 MG tablet Take 1 tablet (50 mg total) by mouth 2 (two) times daily. 60 tablet 0  . traMADol (ULTRAM) 50 MG tablet Take 1 tablet (50 mg total) by mouth 2 (two) times daily. 60 tablet 0  . traMADol (ULTRAM) 50 MG tablet Take 1 tablet (50 mg total) by mouth 2 (two) times daily. 60 tablet 0   No current facility-administered medications on file  prior to visit.     Hypertension  This is a chronic problem. The current episode started more than 1 year ago. The problem is unchanged. The problem is uncontrolled. Associated symptoms include anxiety. Pertinent negatives include no chest pain, palpitations or shortness of breath. Risk factors for coronary artery disease include diabetes mellitus, dyslipidemia, post-menopausal state and sedentary lifestyle. Past treatments include diuretics and ACE inhibitors. The current treatment provides moderate improvement. Compliance problems include diet and exercise.   Hyperlipidemia  This is a chronic problem. The current episode started more than 1 year ago. Recent lipid tests were reviewed and are variable. She has no history of diabetes, hypothyroidism or obesity. Pertinent negatives include no chest pain or shortness of breath. Current antihyperlipidemic treatment includes statins. The current treatment provides moderate improvement of lipids. Compliance problems include adherence to diet and adherence to exercise.  Risk factors for coronary artery disease include dyslipidemia, hypertension and post-menopausal.  Anxiety  Presents for follow-up visit. Patient reports no chest pain, palpitations or shortness of breath. Symptoms occur most days. The severity of symptoms is moderate. Nothing aggravates the symptoms. The quality of sleep is good. Nighttime awakenings: none.    IBS/diverticulosis Use levsin when needed for cramping- uses PRN. She is seeing Dr. Carlton Adam.  No recent flare ups GERD Omeprazole daily- keeps symptoms under control DDD She says that the pain is worsening in her lower back. Pain radiates down her left leg- no weakness that she has noted. Pain assessment: Cause of pain- no known cause of back pain Pain location- lumbar spine Pain on scale of 1-10- 3/10 today Frequency- daily What increases pain- lots of movement What makes pain Better-rest and pain meds Effects on ADL - makes  difficult to do ADL's Any change in general medical condition- hab=ving increasing pain in Left flank -has had in the past and was kidney infection  Current medications- ultram 77m BID Effectiveness of current meds-helps some but never completely relieves pain Adverse reactions form pain meds-none  Pill count performed-No Urine drug [place]- No [place]  [place] were their any concerning findings? - none  Osteopenia Still is having some lower back pain. Does not do any exercising  Review of Systems  Constitutional: Negative.   HENT: Negative.   Eyes: Negative.   Respiratory: Negative.  Negative for shortness of breath.   Cardiovascular: Negative.  Negative for chest pain and palpitations.  Gastrointestinal: Negative.   Endocrine: Negative.   Genitourinary: Negative.   Musculoskeletal: Negative.   Skin: Negative.   Allergic/Immunologic: Negative.   Neurological: Negative.   Hematological: Negative.   Psychiatric/Behavioral: Negative.   All other systems reviewed and are negative.      Objective:   Physical Exam  Constitutional: She is oriented to person, place, and time. She appears well-developed and well-nourished.  HENT:  Head: Normocephalic.  Right Ear: Hearing, tympanic membrane, external ear and ear canal normal.  Left Ear: Hearing, tympanic membrane, external ear and ear canal normal.  Nose: Nose normal.  Mouth/Throat: Uvula is midline and oropharynx is clear and moist.  Eyes: Conjunctivae and EOM are normal. Pupils are equal, round, and reactive to light.  Neck: Normal range of motion and full passive range of motion without pain. Neck supple. No JVD present. Carotid bruit is not present. No thyroid mass and no thyromegaly present.  Cardiovascular: Normal rate, regular rhythm, normal heart sounds and intact distal pulses.   No murmur heard. Pulmonary/Chest: Effort normal and breath sounds normal.  Abdominal: Soft. Bowel sounds are normal. She  exhibits no distension and no mass. There is no tenderness.  Genitourinary: No breast swelling, tenderness, discharge or bleeding.  Genitourinary Comments: Mild CVA tenderness on Left  Musculoskeletal: Normal range of motion. She exhibits no edema.  (+) SLR bil at 90 degrees  Lymphadenopathy:    She has no cervical adenopathy.  Neurological: She is alert and oriented to person, place, and time. She has normal reflexes.  Skin: Skin is warm and dry.  Psychiatric: She has a normal mood and affect. Her behavior is normal. Judgment and thought content normal.  Vitals reviewed.  BP (!) 152/88 (BP Location: Left Arm, Cuff Size: Large)   Pulse 74   Temp 97 F (36.1 C) (Oral)   Ht 5' 1"  (1.549 m)   Wt 140 lb (63.5 kg)   BMI 26.45 kg/m        Assessment & Plan:   1. Essential hypertension Do not add salt to diet - hydrochlorothiazide (HYDRODIURIL) 25 MG tablet; Take 1 tablet (25 mg total) by mouth daily.  Dispense: 90 tablet; Refill: 1 - enalapril (VASOTEC) 20 MG tablet; Take 1 tablet (20 mg total) by mouth daily.  Dispense: 135 tablet; Refill: 1 - CMP14+EGFR  2. BMI 27.0-27.9,adult Discussed  diet and exercise for person with BMI >25 Will recheck weight in 3-6 months  3. Osteopenia Weigh bearing exercises encouraged when able  4. Hyperlipemia Low fat diet - Lipid panel  5. Diverticulosis of large intestine without hemorrhage  6. Gastroesophageal reflux disease without esophagitis Avoid spicy foods Do not eat 2 hours prior to bedtime - omeprazole (PRILOSEC) 40 MG capsule; Take 1 capsule (40 mg total) by mouth daily.  Dispense: 90 capsule; Refill: 1  7. GAD (generalized anxiety disorder) Stress management - ALPRAZolam (XANAX) 0.5 MG tablet; TAKE 1 TABLET BY MOUTH AT BEDTIME AS NEEDED FOR SLEEP  Dispense: 60 tablet; Refill: 1  8. Left-sided low back pain without sciatica - Urinalysis, Complete  9. Chronic low back pain  10. Encounter for pain management counseling  11.  Degenerative disc disease, lumbar - traMADol (ULTRAM) 50 MG tablet; Take 1 tablet (50 mg total) by mouth 2 (two) times daily.  Dispense: 60 tablet; Refill: 0 - traMADol (ULTRAM) 50 MG tablet; Take 1 tablet (50 mg total) by mouth 2 (two) times daily.  Dispense: 60 tablet; Refill: 0 - traMADol (ULTRAM) 50 MG tablet; Take 1 tablet (50 mg total) by mouth 2 (two) times daily.  Dispense: 60 tablet; Refill: 0  12. Uti -macrobid 182m BID x7 days Take medication as prescribed Cotton underwear Take shower not bath Cranberry juice, yogurt Force fluids AZO over the counter X2 days Culture pending RTO prn    Labs pending Health maintenance reviewed Diet and exercise encouraged Continue all meds Follow up  In 3 months pain management   Cariann-Margaret MHassell Done FNP

## 2015-08-29 NOTE — Patient Instructions (Signed)

## 2015-08-30 LAB — LIPID PANEL
Chol/HDL Ratio: 2.3 ratio units (ref 0.0–4.4)
Cholesterol, Total: 163 mg/dL (ref 100–199)
HDL: 71 mg/dL (ref >39)
LDL Calculated: 61 mg/dL (ref 0–99)
Triglycerides: 153 mg/dL — ABNORMAL HIGH (ref 0–149)
VLDL CHOLESTEROL CAL: 31 mg/dL (ref 5–40)

## 2015-08-30 LAB — CMP14+EGFR
ALT: 23 IU/L (ref 0–32)
AST: 26 IU/L (ref 0–40)
Albumin/Globulin Ratio: 2 (ref 1.2–2.2)
Albumin: 4.7 g/dL (ref 3.6–4.8)
Alkaline Phosphatase: 123 IU/L — ABNORMAL HIGH (ref 39–117)
BUN/Creatinine Ratio: 27 (ref 12–28)
BUN: 22 mg/dL (ref 8–27)
Bilirubin Total: 0.6 mg/dL (ref 0.0–1.2)
CO2: 26 mmol/L (ref 18–29)
Calcium: 10.3 mg/dL (ref 8.7–10.3)
Chloride: 98 mmol/L (ref 96–106)
Creatinine, Ser: 0.82 mg/dL (ref 0.57–1.00)
GFR calc Af Amer: 88 mL/min/1.73
GFR calc non Af Amer: 76 mL/min/1.73
Globulin, Total: 2.3 g/dL (ref 1.5–4.5)
Glucose: 94 mg/dL (ref 65–99)
Potassium: 4.4 mmol/L (ref 3.5–5.2)
Sodium: 141 mmol/L (ref 134–144)
Total Protein: 7 g/dL (ref 6.0–8.5)

## 2015-08-31 LAB — URINE CULTURE

## 2015-09-04 ENCOUNTER — Encounter: Payer: Self-pay | Admitting: Nurse Practitioner

## 2015-09-04 ENCOUNTER — Ambulatory Visit (INDEPENDENT_AMBULATORY_CARE_PROVIDER_SITE_OTHER): Payer: Commercial Managed Care - HMO | Admitting: Nurse Practitioner

## 2015-09-04 VITALS — BP 144/82 | HR 87 | Temp 97.1°F | Ht 61.0 in | Wt 145.0 lb

## 2015-09-04 DIAGNOSIS — R52 Pain, unspecified: Secondary | ICD-10-CM | POA: Diagnosis not present

## 2015-09-04 DIAGNOSIS — R5383 Other fatigue: Secondary | ICD-10-CM

## 2015-09-04 NOTE — Progress Notes (Signed)
   Subjective:    Patient ID: Maria Moss, female    DOB: December 19, 1950, 65 y.o.   MRN: [id number]  HPI Patient comes in today c/o being achy all over and feels like she just does not have energy to do anything. This has been going on for 2 weeks. She was seen 08/29/15 with similar complaints- was treated for UTI but feels no better. She takes ultram BID and that is not helping with achiness.    Review of Systems  Constitutional: Positive for fatigue. Negative for appetite change, chills and fever.  HENT: Negative.   Respiratory: Negative.   Cardiovascular: Negative.   Gastrointestinal: Negative for abdominal pain, constipation, diarrhea, nausea and vomiting.  Genitourinary: Negative.  Negative for dysuria, frequency, pelvic pain and urgency.  Neurological: Negative.   Psychiatric/Behavioral: Negative.        Objective:   Physical Exam  Constitutional: She appears well-developed and well-nourished.  Cardiovascular: Normal rate, regular rhythm and normal heart sounds.   Pulmonary/Chest: Effort normal and breath sounds normal.  Skin: Skin is warm.  Psychiatric: She has a normal mood and affect. Her behavior is normal. Judgment and thought content normal.   BP (!) 144/82 (BP Location: Left Arm, Cuff Size: Normal)   Pulse 87   Temp 97.1 F (36.2 C) (Oral)   Ht 5\' 1"  (1.549 m)   Wt 145 lb (65.8 kg)   BMI 27.40 kg/m       Assessment & Plan:   1. Other fatigue   2. Aches    Orders Placed This Encounter  Procedures  . CBC with Differential/Platelet  . Thyroid Panel With TSH   Rest Force fluids Continue mobic and ultram as rx Follow up prn  Shaunte-Margaret Hassell Done, FNP

## 2015-09-04 NOTE — Patient Instructions (Signed)
Fatigue  Fatigue is feeling tired all of the time, a lack of energy, or a lack of motivation. Occasional or mild fatigue is often a normal response to activity or life in general. However, long-lasting (chronic) or extreme fatigue may indicate an underlying medical condition.  HOME CARE INSTRUCTIONS   Watch your fatigue for any changes. The following actions may help to lessen any discomfort you are feeling:  · Talk to your health care provider about how much sleep you need each night. Try to get the required amount every night.  · Take medicines only as directed by your health care provider.  · Eat a healthy and nutritious diet. Ask your health care provider if you need help changing your diet.  · Drink enough fluid to keep your urine clear or pale yellow.  · Practice ways of relaxing, such as yoga, meditation, massage therapy, or acupuncture.  · Exercise regularly.    · Change situations that cause you stress. Try to keep your work and personal routine reasonable.  · Do not abuse illegal drugs.  · Limit alcohol intake to no more than 1 drink per day for nonpregnant women and 2 drinks per day for men. One drink equals 12 ounces of beer, 5 ounces of wine, or 1½ ounces of hard liquor.  · Take a multivitamin, if directed by your health care provider.  SEEK MEDICAL CARE IF:   · Your fatigue does not get better.  · You have a fever.    · You have unintentional weight loss or gain.  · You have headaches.    · You have difficulty:      Falling asleep.    Sleeping throughout the night.  · You feel angry, guilty, anxious, or sad.     · You are unable to have a bowel movement (constipation).    · You skin is dry.     · Your legs or another part of your body is swollen.    SEEK IMMEDIATE MEDICAL CARE IF:   · You feel confused.    · Your vision is blurry.  · You feel faint or pass out.    · You have a severe headache.    · You have severe abdominal, pelvic, or back pain.    · You have chest pain, shortness of breath, or an  irregular or fast heartbeat.    · You are unable to urinate or you urinate less than normal.    · You develop abnormal bleeding, such as bleeding from the rectum, vagina, nose, lungs, or nipples.  · You vomit blood.     · You have thoughts about harming yourself or committing suicide.    · You are worried that you might harm someone else.       This information is not intended to replace advice given to you by your health care provider. Make sure you discuss any questions you have with your health care provider.     Document Released: 10/18/2006 Document Revised: 01/11/2014 Document Reviewed: 04/24/2013  Elsevier Interactive Patient Education ©2016 Elsevier Inc.

## 2015-09-05 LAB — CBC WITH DIFFERENTIAL/PLATELET
BASOS ABS: 0 10*3/uL (ref 0.0–0.2)
BASOS: 0 %
EOS (ABSOLUTE): 0.5 10*3/uL — AB (ref 0.0–0.4)
Eos: 5 %
Hematocrit: 33.8 % — ABNORMAL LOW (ref 34.0–46.6)
Hemoglobin: 10.8 g/dL — ABNORMAL LOW (ref 11.1–15.9)
IMMATURE GRANULOCYTES: 0 %
Immature Grans (Abs): 0 10*3/uL (ref 0.0–0.1)
LYMPHS: 16 %
Lymphocytes Absolute: 1.5 10*3/uL (ref 0.7–3.1)
MCH: 27.6 pg (ref 26.6–33.0)
MCHC: 32 g/dL (ref 31.5–35.7)
MCV: 86 fL (ref 79–97)
MONOS ABS: 1 10*3/uL — AB (ref 0.1–0.9)
Monocytes: 10 %
NEUTROS PCT: 69 %
Neutrophils Absolute: 6.5 10*3/uL (ref 1.4–7.0)
PLATELETS: 430 10*3/uL — AB (ref 150–379)
RBC: 3.91 x10E6/uL (ref 3.77–5.28)
RDW: 13.8 % (ref 12.3–15.4)
WBC: 9.5 10*3/uL (ref 3.4–10.8)

## 2015-09-05 LAB — THYROID PANEL WITH TSH
Free Thyroxine Index: 1.9 (ref 1.2–4.9)
T3 UPTAKE RATIO: 27 % (ref 24–39)
T4, Total: 7.1 ug/dL (ref 4.5–12.0)
TSH: 1.08 u[IU]/mL (ref 0.450–4.500)

## 2015-09-10 ENCOUNTER — Other Ambulatory Visit: Payer: Self-pay | Admitting: Nurse Practitioner

## 2015-09-10 DIAGNOSIS — M7732 Calcaneal spur, left foot: Secondary | ICD-10-CM

## 2015-09-11 ENCOUNTER — Telehealth: Payer: Self-pay | Admitting: Nurse Practitioner

## 2015-09-11 NOTE — Telephone Encounter (Signed)
Pt notified of results Verbalizes understanding 

## 2015-09-26 ENCOUNTER — Other Ambulatory Visit: Payer: Commercial Managed Care - HMO

## 2015-09-26 DIAGNOSIS — Z1212 Encounter for screening for malignant neoplasm of rectum: Secondary | ICD-10-CM | POA: Diagnosis not present

## 2015-10-01 LAB — FECAL OCCULT BLOOD, IMMUNOCHEMICAL: Fecal Occult Bld: NEGATIVE

## 2015-10-06 ENCOUNTER — Other Ambulatory Visit: Payer: Self-pay | Admitting: Nurse Practitioner

## 2015-10-06 DIAGNOSIS — K5730 Diverticulosis of large intestine without perforation or abscess without bleeding: Secondary | ICD-10-CM

## 2015-10-17 ENCOUNTER — Ambulatory Visit (INDEPENDENT_AMBULATORY_CARE_PROVIDER_SITE_OTHER): Payer: Commercial Managed Care - HMO | Admitting: Physician Assistant

## 2015-10-17 ENCOUNTER — Encounter: Payer: Self-pay | Admitting: Physician Assistant

## 2015-10-17 VITALS — BP 133/79 | HR 124 | Temp 98.0°F | Ht 61.0 in | Wt 141.2 lb

## 2015-10-17 DIAGNOSIS — R300 Dysuria: Secondary | ICD-10-CM

## 2015-10-17 DIAGNOSIS — N3000 Acute cystitis without hematuria: Secondary | ICD-10-CM

## 2015-10-17 DIAGNOSIS — K5730 Diverticulosis of large intestine without perforation or abscess without bleeding: Secondary | ICD-10-CM | POA: Diagnosis not present

## 2015-10-17 MED ORDER — HYOSCYAMINE SULFATE 0.125 MG SL SUBL: 0.1250 mg | SUBLINGUAL_TABLET | Freq: Four times a day (QID) | SUBLINGUAL | 3 refills | Status: DC | PRN

## 2015-10-17 MED ORDER — CIPROFLOXACIN HCL 500 MG PO TABS: 500.0000 mg | ORAL_TABLET | Freq: Two times a day (BID) | ORAL | 0 refills | Status: DC

## 2015-10-17 NOTE — Patient Instructions (Addendum)
Urinary Tract Infection Urinary tract infections (UTIs) can develop anywhere along your urinary tract. Your urinary tract is your body's drainage system for removing wastes and extra water. Your urinary tract includes two kidneys, two ureters, a bladder, and a urethra. Your kidneys are a pair of bean-shaped organs. Each kidney is about the size of your fist. They are located below your ribs, one on each side of your spine. CAUSES Infections are caused by microbes, which are microscopic organisms, including fungi, viruses, and bacteria. These organisms are so small that they can only be seen through a microscope. Bacteria are the microbes that most commonly cause UTIs. SYMPTOMS  Symptoms of UTIs may vary by age and gender of the patient and by the location of the infection. Symptoms in young women typically include a frequent and intense urge to urinate and a painful, burning feeling in the bladder or urethra during urination. Older women and men are more likely to be tired, shaky, and weak and have muscle aches and abdominal pain. A fever may mean the infection is in your kidneys. Other symptoms of a kidney infection include pain in your back or sides below the ribs, nausea, and vomiting. DIAGNOSIS To diagnose a UTI, your caregiver will ask you about your symptoms. Your caregiver will also ask you to provide a urine sample. The urine sample will be tested for bacteria and white blood cells. White blood cells are made by your body to help fight infection. TREATMENT  Typically, UTIs can be treated with medication. Because most UTIs are caused by a bacterial infection, they usually can be treated with the use of antibiotics. The choice of antibiotic and length of treatment depend on your symptoms and the type of bacteria causing your infection. HOME CARE INSTRUCTIONS  If you were prescribed antibiotics, take them exactly as your caregiver instructs you. Finish the medication even if you feel better after  you have only taken some of the medication.  Drink enough water and fluids to keep your urine clear or pale yellow.  Avoid caffeine, tea, and carbonated beverages. They tend to irritate your bladder.  Empty your bladder often. Avoid holding urine for long periods of time.  Empty your bladder before and after sexual intercourse.  After a bowel movement, women should cleanse from front to back. Use each tissue only once. SEEK MEDICAL CARE IF:   You have back pain.  You develop a fever.  Your symptoms do not begin to resolve within 3 days. SEEK IMMEDIATE MEDICAL CARE IF:   You have severe back pain or lower abdominal pain.  You develop chills.  You have nausea or vomiting.  You have continued burning or discomfort with urination. MAKE SURE YOU:   Understand these instructions.  Will watch your condition.  Will get help right away if you are not doing well or get worse.   This information is not intended to replace advice given to you by your health care provider. Make sure you discuss any questions you have with your health care provider.   Document Released: 09/30/2004 Document Revised: 09/11/2014 Document Reviewed: 01/29/2011 Elsevier Interactive Patient Education [employer]. uti

## 2015-10-17 NOTE — Progress Notes (Signed)
BP 133/79   Pulse (!) 124   Temp 98 F (36.7 C) (Oral)   Ht 5\' 1"  (1.549 m)   Wt 141 lb 3.2 oz (64 kg)   BMI 26.68 kg/m    Subjective:    Patient ID: Maria Moss, female    DOB: May 14, 1950, 65 y.o.   MRN: [id number]  HPI: Maria Moss is a 65 y.o. female presenting on 10/17/2015 for UTI, aches  Patient has had suprapubic pain for several days. She has no history of urinary tract infections. Over the past 2 days however she has had increased body aches and fever. She doesn't know of any exposure to viral illnesses that she has had. She is not had any significant cough congestion or sinus drainage. She has had increased frequency of urine, pain with urination, and some radiation of pain to the back and up the left side. She has no prior history of pyelonephritis.   Relevant past medical, surgical, family and social history reviewed and updated as indicated. Interim medical history since our last visit reviewed. Allergies and medications reviewed and updated. DATA REVIEWED: CHART IN EPIC  Social History   Social History  . Marital status: Married    Spouse name: N/A  . Number of children: N/A  . Years of education: N/A   Occupational History  . Not on file.   Social History Main Topics  . Smoking status: Never Smoker  . Smokeless tobacco: Never Used  . Alcohol use No  . Drug use: No  . Sexual activity: Not on file   Other Topics Concern  . Not on file   Social History Narrative  . No narrative on file    Past Surgical History:  Procedure Laterality Date  . ANKLE SURGERY Left    Broken.  Has screws and metal plate.  . CHOLECYSTECTOMY      Family History  Problem Relation Age of Onset  . Heart disease Mother     CHF  . Cancer Mother     Uterine  . Osteoporosis Mother   . COPD Sister   . Asthma Sister   . Migraines Sister     Review of Systems  Constitutional: Positive for fatigue and fever.  HENT: Positive for sore throat. Negative for congestion and  postnasal drip.   Eyes: Negative.   Respiratory: Positive for cough and wheezing.   Cardiovascular: Negative for chest pain and palpitations.  Gastrointestinal: Positive for abdominal pain. Negative for constipation, diarrhea and nausea.  Genitourinary: Positive for difficulty urinating, dysuria, flank pain, frequency and urgency.  Skin: Negative.       Medication List       Accurate as of 10/17/15  1:00 PM. Always use your most recent med list.          ALPRAZolam 0.5 MG tablet Commonly known as:  XANAX TAKE 1 TABLET BY MOUTH AT BEDTIME AS NEEDED FOR SLEEP   ciprofloxacin 500 MG tablet Commonly known as:  CIPRO Take 1 tablet (500 mg total) by mouth 2 (two) times daily.   cyclobenzaprine 10 MG tablet Commonly known as:  FLEXERIL Take 1 tablet (10 mg total) by mouth 3 (three) times daily as needed. for muscle spams   enalapril 20 MG tablet Commonly known as:  VASOTEC Take 1 tablet (20 mg total) by mouth daily.   Fish Oil 1000 MG Caps Take by mouth.   fluticasone 50 MCG/ACT nasal spray Commonly known as:  FLONASE USE 2 SPRAYS IN [facility]  NOSTRIL EVERY DAY   hydrochlorothiazide 25 MG tablet Commonly known as:  HYDRODIURIL Take 1 tablet (25 mg total) by mouth daily.   hyoscyamine 0.125 MG Tbdp disintergrating tablet Commonly known as:  ANASPAZ TAKE 1 TABLET BY MOUTH EVERY 6 HOURS AS NEEDED   hyoscyamine 0.125 MG SL tablet Commonly known as:  LEVSIN SL Take 1 tablet (0.125 mg total) by mouth every 6 (six) hours as needed.   loratadine 10 MG tablet Commonly known as:  CLARITIN Take 10 mg by mouth daily.   meloxicam 15 MG tablet Commonly known as:  MOBIC TAKE 1 TABLET (15 MG TOTAL) BY MOUTH DAILY.   nitrofurantoin (macrocrystal-monohydrate) 100 MG capsule Commonly known as:  MACROBID TAKE 1 CAPSULE TWICE DAILY   omeprazole 40 MG capsule Commonly known as:  PRILOSEC Take 1 capsule (40 mg total) by mouth daily.   PROBIOTIC & ACIDOPHILUS EX ST PO Take by  mouth.   simvastatin 20 MG tablet Commonly known as:  ZOCOR TAKE 1 TABLET AT BEDTIME   traMADol 50 MG tablet Commonly known as:  ULTRAM Take 1 tablet (50 mg total) by mouth 2 (two) times daily.   traMADol 50 MG tablet Commonly known as:  ULTRAM Take 1 tablet (50 mg total) by mouth 2 (two) times daily.   traMADol 50 MG tablet Commonly known as:  ULTRAM Take 1 tablet (50 mg total) by mouth 2 (two) times daily.   VITAMIN D-1000 MAX ST 1000 units tablet Generic drug:  Cholecalciferol Take by mouth.          Objective:    BP 133/79   Pulse (!) 124   Temp 98 F (36.7 C) (Oral)   Ht 5\' 1"  (1.549 m)   Wt 141 lb 3.2 oz (64 kg)   BMI 26.68 kg/m   Allergies  Allergen Reactions  . Codeine Nausea And Vomiting  . Sulfa Antibiotics     Wt Readings from Last 3 Encounters:  10/17/15 141 lb 3.2 oz (64 kg)  09/04/15 145 lb (65.8 kg)  08/29/15 140 lb (63.5 kg)    Physical Exam  Results for orders placed or performed in visit on 09/26/15  Fecal occult blood, imunochemical  Result Value Ref Range   Fecal Occult Bld Negative Negative      Assessment & Plan:   1. Dysuria - Urinalysis, Complete  2. Diverticulosis of large intestine without hemorrhage - hyoscyamine (LEVSIN SL) 0.125 MG SL tablet; Take 1 tablet (0.125 mg total) by mouth every 6 (six) hours as needed.  Dispense: 90 tablet; Refill: 3  3. Acute cystitis without hematuria - ciprofloxacin (CIPRO) 500 MG tablet; Take 1 tablet (500 mg total) by mouth 2 (two) times daily.  Dispense: 20 tablet; Refill: 0   Continue all other maintenance medications as listed above.  Follow up plan: Return if symptoms worsen or fail to improve.  Orders Placed This Encounter  Procedures  . Urinalysis, Complete    Educational handout given for uti.  Terald Sleeper PA-C [facility] [address]  [place], [place] [address] [phone]   10/17/2015, 1:00 PM

## 2015-11-17 ENCOUNTER — Other Ambulatory Visit: Payer: Self-pay | Admitting: Nurse Practitioner

## 2015-11-17 DIAGNOSIS — M7732 Calcaneal spur, left foot: Secondary | ICD-10-CM

## 2015-11-28 ENCOUNTER — Ambulatory Visit (INDEPENDENT_AMBULATORY_CARE_PROVIDER_SITE_OTHER): Payer: Commercial Managed Care - HMO | Admitting: Nurse Practitioner

## 2015-11-28 ENCOUNTER — Encounter: Payer: Self-pay | Admitting: Nurse Practitioner

## 2015-11-28 VITALS — BP 132/84 | HR 89 | Temp 96.9°F | Ht 61.0 in | Wt 144.0 lb

## 2015-11-28 DIAGNOSIS — K5730 Diverticulosis of large intestine without perforation or abscess without bleeding: Secondary | ICD-10-CM | POA: Diagnosis not present

## 2015-11-28 DIAGNOSIS — I10 Essential (primary) hypertension: Secondary | ICD-10-CM

## 2015-11-28 DIAGNOSIS — K219 Gastro-esophageal reflux disease without esophagitis: Secondary | ICD-10-CM

## 2015-11-28 DIAGNOSIS — R1084 Generalized abdominal pain: Secondary | ICD-10-CM | POA: Diagnosis not present

## 2015-11-28 DIAGNOSIS — Z0289 Encounter for other administrative examinations: Secondary | ICD-10-CM | POA: Insufficient documentation

## 2015-11-28 LAB — URINALYSIS
BILIRUBIN UA: NEGATIVE
GLUCOSE, UA: NEGATIVE
KETONES UA: NEGATIVE
Nitrite, UA: NEGATIVE
Specific Gravity, UA: 1.01 (ref 1.005–1.030)
UUROB: 0.2 mg/dL (ref 0.2–1.0)
pH, UA: 7 (ref 5.0–7.5)

## 2015-11-28 MED ORDER — HYDROCHLOROTHIAZIDE 25 MG PO TABS: 25.0000 mg | ORAL_TABLET | Freq: Every day | ORAL | 1 refills | Status: DC

## 2015-11-28 MED ORDER — HYDROCODONE-ACETAMINOPHEN 5-325 MG PO TABS: 1.0000 | ORAL_TABLET | Freq: Four times a day (QID) | ORAL | 0 refills | Status: DC | PRN

## 2015-11-28 MED ORDER — OMEPRAZOLE 40 MG PO CPDR: 40.0000 mg | DELAYED_RELEASE_CAPSULE | Freq: Every day | ORAL | 1 refills | Status: DC

## 2015-11-28 MED ORDER — HYOSCYAMINE SULFATE 0.125 MG PO TBDP: 0.1250 mg | ORAL_TABLET | Freq: Four times a day (QID) | ORAL | 2 refills | Status: DC | PRN

## 2015-11-28 MED ORDER — ENALAPRIL MALEATE 20 MG PO TABS: 20.0000 mg | ORAL_TABLET | Freq: Every day | ORAL | 1 refills | Status: DC

## 2015-11-28 MED ORDER — SIMVASTATIN 20 MG PO TABS: 20.0000 mg | ORAL_TABLET | Freq: Every day | ORAL | 1 refills | Status: DC

## 2015-11-28 NOTE — Addendum Note (Signed)
Addended by: Chevis Pretty on: 11/28/2015 10:46 AM   Modules accepted: Orders

## 2015-11-28 NOTE — Patient Instructions (Signed)
Muscle Pain, Adult Muscle pain (myalgia) may be mild or severe. In most cases, the pain lasts only a short time and it goes away without treatment. It is normal to feel some muscle pain after starting a workout program. Muscles that have not been used often will be sore at first. Muscle pain may also be caused by many other things, including:  Overuse or muscle strain, especially if you are not in shape. This is the most common cause of muscle pain.  Injury.  Bruises.  Viruses, such as the flu.  Infectious diseases.  A chronic condition that causes muscle tenderness, fatigue, and headache (fibromyalgia).  A condition, such as lupus, in which the body's disease-fighting system attacks other organs in the body (autoimmune or rheumatologic diseases).  Certain drugs, including ACE inhibitors and statins. To diagnose the cause of your muscle pain, your health care provider will do a physical exam and ask questions about the pain and when it began. If you have not had muscle pain for very long, your health care provider may want to wait before doing much testing. If your muscle pain has lasted a long time, your health care provider may want to run tests right away. In some cases, this may include tests to rule out certain conditions or illnesses. Treatment for muscle pain depends on the cause. Home care is often enough to relieve muscle pain. Your health care provider may also prescribe anti-inflammatory medicine. Follow these instructions at home: Activity   If overuse is causing your muscle pain:  Slow down your activities until the pain goes away.  Do regular, gentle exercises if you are not usually active.  Warm up before exercising. Stretch before and after exercising. This can help lower the risk of muscle pain.  Do not continue working out if the pain is very bad. Bad pain could mean that you have injured a muscle. Managing pain and discomfort    If directed, apply ice to the  sore muscle:  Put ice in a plastic bag.  Place a towel between your skin and the bag.  Leave the ice on for 20 minutes, 2-3 times a day.  You may also alternate between applying ice and applying heat as told by your health care provider. To apply heat, use the heat source that your health care provider recommends, such as a moist heat pack or a heating pad.  Place a towel between your skin and the heat source.  Leave the heat on for 20-30 minutes.  Remove the heat if your skin turns bright red. This is especially important if you are unable to feel pain, heat, or cold. You may have a greater risk of getting burned. Medicines   Take over-the-counter and prescription medicines only as told by your health care provider.  Do not drive or use heavy machinery while taking prescription pain medicine. Contact a health care provider if:  Your muscle pain gets worse and medicines do not help.  You have muscle pain that lasts longer than 3 days.  You have a rash or fever along with muscle pain.  You have muscle pain after a tick bite.  You have muscle pain while working out, even though you are in good physical condition.  You have redness, soreness, or swelling along with muscle pain.  You have muscle pain after starting a new medicine or changing the dose of a medicine. Get help right away if:  You have trouble breathing.  You have trouble swallowing.    You have muscle pain along with a stiff neck, fever, and vomiting.  You have severe muscle weakness or cannot move part of your body. This information is not intended to replace advice given to you by your health care provider. Make sure you discuss any questions you have with your health care provider. Document Released: 11/12/2005 Document Revised: 07/11/2015 Document Reviewed: 05/13/2015 Elsevier Interactive Patient Education  2017 [employer] Inc.  

## 2015-11-28 NOTE — Progress Notes (Signed)
Subjective:    Patient ID: Maria Moss, female    DOB: Jan 03, 1951, 65 y.o.   MRN: [id number]   Patient here today for follow up of chronic medical problems. Patient still c/o pressure when she uses the bathroom with continued low back pain.  Outpatient Encounter Prescriptions as of 11/28/2015  Medication Sig  . ALPRAZolam (XANAX) 0.5 MG tablet TAKE 1 TABLET BY MOUTH AT BEDTIME AS NEEDED FOR SLEEP  . Cholecalciferol (VITAMIN D-1000 MAX ST) 1000 units tablet Take by mouth.  . ciprofloxacin (CIPRO) 500 MG tablet Take 1 tablet (500 mg total) by mouth 2 (two) times daily.  . cyclobenzaprine (FLEXERIL) 10 MG tablet Take 1 tablet (10 mg total) by mouth 3 (three) times daily as needed. for muscle spams  . enalapril (VASOTEC) 20 MG tablet Take 1 tablet (20 mg total) by mouth daily.  . fluticasone (FLONASE) 50 MCG/ACT nasal spray USE 2 SPRAYS IN EACH NOSTRIL EVERY DAY  . hydrochlorothiazide (HYDRODIURIL) 25 MG tablet Take 1 tablet (25 mg total) by mouth daily.  . hyoscyamine (ANASPAZ) 0.125 MG TBDP disintergrating tablet TAKE 1 TABLET BY MOUTH EVERY 6 HOURS AS NEEDED  . hyoscyamine (LEVSIN SL) 0.125 MG SL tablet Take 1 tablet (0.125 mg total) by mouth every 6 (six) hours as needed.  . loratadine (CLARITIN) 10 MG tablet Take 10 mg by mouth daily.  . meloxicam (MOBIC) 15 MG tablet TAKE 1 TABLET EVERY DAY  . nitrofurantoin, macrocrystal-monohydrate, (MACROBID) 100 MG capsule TAKE 1 CAPSULE TWICE DAILY  . Omega-3 Fatty Acids (FISH OIL) 1000 MG CAPS Take by mouth.  Marland Kitchen omeprazole (PRILOSEC) 40 MG capsule Take 1 capsule (40 mg total) by mouth daily.  . Probiotic Product (PROBIOTIC & ACIDOPHILUS EX ST PO) Take by mouth.  . simvastatin (ZOCOR) 20 MG tablet TAKE 1 TABLET AT BEDTIME   . traMADol (ULTRAM) 50 MG tablet Take 1 tablet (50 mg total) by mouth 2 (two) times daily.  . traMADol (ULTRAM) 50 MG tablet Take 1 tablet (50 mg total) by mouth 2 (two) times daily.  . traMADol (ULTRAM) 50 MG tablet Take 1 tablet  (50 mg total) by mouth 2 (two) times daily.   No facility-administered encounter medications on file as of 11/28/2015.      Hypertension  This is a chronic problem. The current episode started more than 1 year ago. The problem is unchanged. The problem is uncontrolled. Associated symptoms include anxiety. Pertinent negatives include no chest pain, palpitations or shortness of breath. Risk factors for coronary artery disease include diabetes mellitus, dyslipidemia, post-menopausal state and sedentary lifestyle. Past treatments include diuretics and ACE inhibitors. The current treatment provides moderate improvement. Compliance problems include diet and exercise.   Hyperlipidemia  This is a chronic problem. The current episode started more than 1 year ago. Recent lipid tests were reviewed and are variable. She has no history of diabetes, hypothyroidism or obesity. Pertinent negatives include no chest pain or shortness of breath. Current antihyperlipidemic treatment includes statins. The current treatment provides moderate improvement of lipids. Compliance problems include adherence to diet and adherence to exercise.  Risk factors for coronary artery disease include dyslipidemia, hypertension and post-menopausal.  Anxiety  Presents for follow-up visit. Patient reports no chest pain, palpitations or shortness of breath. Symptoms occur most days. The severity of symptoms is moderate. Nothing aggravates the symptoms. The quality of sleep is good. Nighttime awakenings: none.    IBS/diverticulosis Use levsin when needed for cramping- uses PRN. She is seeing Dr. Carlton Adam. No recent  flare ups GERD Omeprazole daily- keeps symptoms under control DDD Was diagnosed by S. Weeks and has been taking ultram for- she says that the pain is worsening in her lower back. Pain radiates down her left leg- no weakness that she has noted. Pain assessment: Cause of pain- DDD- was told by one doctors years ago that she had  early fibromyalgia. Pain location- she says that pain is all over body- just aches constatnly Pain on scale of 1-10- 8/10 currently despite the tramadol this morning. Frequency- daily What increases pain-- nothing really What makes pain Better-- pain pills help but do not seem to be working as well as they use to Effects on ADL - some days she can hardly  Move due to pain. Any change in general medical condition- just pain has increased  Current medications- tramadol 50mg  BId Effectiveness of current meds-not as effective Adverse reactions form pain meds-- none Morphine equivalent- 10  Pill count performed-No Urine drug screen- No Was the [place] reviewed- yes  If yes were their any concerning findings? - no suspicious findings  Osteopenia Still is having some lower back pain.  Review of Systems  Constitutional: Negative.   HENT: Negative.   Eyes: Negative.   Respiratory: Negative.  Negative for shortness of breath.   Cardiovascular: Negative.  Negative for chest pain and palpitations.  Gastrointestinal: Negative.   Endocrine: Negative.   Genitourinary: Negative.   Musculoskeletal: Negative.   Skin: Negative.   Allergic/Immunologic: Negative.   Neurological: Negative.   Hematological: Negative.   Psychiatric/Behavioral: Negative.   All other systems reviewed and are negative.      Objective:   Physical Exam  Constitutional: She is oriented to person, place, and time. She appears well-developed and well-nourished.  HENT:  Head: Normocephalic.  Right Ear: Hearing, tympanic membrane, external ear and ear canal normal.  Left Ear: Hearing, tympanic membrane, external ear and ear canal normal.  Nose: Nose normal.  Mouth/Throat: Uvula is midline and oropharynx is clear and moist.  Eyes: Conjunctivae and EOM are normal. Pupils are equal, round, and reactive to light.  Neck: Normal range of motion and full passive range of motion without pain. Neck supple. No JVD present.  Carotid bruit is not present. No thyroid mass and no thyromegaly present.  Cardiovascular: Normal rate, regular rhythm, normal heart sounds and intact distal pulses.   No murmur heard. Pulmonary/Chest: Effort normal and breath sounds normal.  Abdominal: Soft. Bowel sounds are normal. She exhibits no distension and no mass. There is no tenderness.  Genitourinary: No breast swelling, tenderness, discharge or bleeding.  Musculoskeletal: Normal range of motion. She exhibits no edema.  Lymphadenopathy:    She has no cervical adenopathy.  Neurological: She is alert and oriented to person, place, and time.  Skin: Skin is warm and dry.  Psychiatric: She has a normal mood and affect. Her behavior is normal. Judgment and thought content normal.  Vitals reviewed.  BP 132/84 (BP Location: Right Arm, Cuff Size: Normal)   Pulse 89   Temp (!) 96.9 F (36.1 C) (Oral)   Ht 5\' 1"  (1.549 m)   Wt 144 lb (65.3 kg)   BMI 27.21 kg/m    Ua- clear     Assessment & Plan:   1. Generalized abdominal pain Urine clear - Urinalysis  2. Diverticulosis of large intestine without hemorrhage Watch what foods you eat - hyoscyamine (ANASPAZ) 0.125 MG TBDP disintergrating tablet; Take 1 tablet (0.125 mg total) by mouth every 6 (six) hours as  needed.  Dispense: 30 tablet; Refill: 2  3. Essential hypertension low sodium diet - hydrochlorothiazide (HYDRODIURIL) 25 MG tablet; Take 1 tablet (25 mg total) by mouth daily.  Dispense: 90 tablet; Refill: 1 - enalapril (VASOTEC) 20 MG tablet; Take 1 tablet (20 mg total) by mouth daily.  Dispense: 135 tablet; Refill: 1  4. Gastroesophageal reflux disease without esophagitis Avoid spicy foods Do not eat 2 hours prior to bedtime - omeprazole (PRILOSEC) 40 MG capsule; Take 1 capsule (40 mg total) by mouth daily.  Dispense: 90 capsule; Refill: 1  Meds ordered this encounter  Medications  . hyoscyamine (ANASPAZ) 0.125 MG TBDP disintergrating tablet    Sig: Take 1 tablet  (0.125 mg total) by mouth every 6 (six) hours as needed.    Dispense:  30 tablet    Refill:  2    Order Specific Question:   Supervising Provider    Answer:   VINCENT, CAROL L [[id number]]  . hydrochlorothiazide (HYDRODIURIL) 25 MG tablet    Sig: Take 1 tablet (25 mg total) by mouth daily.    Dispense:  90 tablet    Refill:  1    Order Specific Question:   Supervising Provider    Answer:   VINCENT, CAROL L [[id number]]  . enalapril (VASOTEC) 20 MG tablet    Sig: Take 1 tablet (20 mg total) by mouth daily.    Dispense:  135 tablet    Refill:  1    Order Specific Question:   Supervising Provider    Answer:   VINCENT, CAROL L [[id number]]  . omeprazole (PRILOSEC) 40 MG capsule    Sig: Take 1 capsule (40 mg total) by mouth daily.    Dispense:  90 capsule    Refill:  1    Order Specific Question:   Supervising Provider    Answer:   VINCENT, CAROL L [[id number]]  . simvastatin (ZOCOR) 20 MG tablet    Sig: Take 1 tablet (20 mg total) by mouth at bedtime.    Dispense:  90 tablet    Refill:  1    Order Specific Question:   Supervising Provider    Answer:   VINCENT, CAROL L [[id number]]  . HYDROcodone-acetaminophen (NORCO/VICODIN) 5-325 MG tablet    Sig: Take 1 tablet by mouth every 6 (six) hours as needed for moderate pain.    Dispense:  30 tablet    Refill:  0    DO NOT FILL TILL 12/17/15    Order Specific Question:   Supervising Provider    Answer:   Assunta Found L [[id number]]  . HYDROcodone-acetaminophen (LORTAB) 5-325 MG tablet    Sig: Take 1 tablet by mouth every 6 (six) hours as needed for moderate pain.    Dispense:  40 tablet    Refill:  0    DO NOT FILL TILL 01/16/16    Order Specific Question:   Supervising Provider    Answer:   Assunta Found L [[id number]]  . HYDROcodone-acetaminophen (LORTAB) 5-325 MG tablet    Sig: Take 1 tablet by mouth every 6 (six) hours as needed for moderate pain.    Dispense:  40 tablet    Refill:  0    DO NOT FILL TILL 02/15/16    Order Specific Question:   Supervising Provider     Answer:   Eustaquio Maize [[id number]]     Labs pending Health maintenance reviewed Diet and exercise encouraged Continue all meds Follow up  In 3 momths   Maria  Hassell Moss, [place]

## 2015-11-29 LAB — CMP14+EGFR
ALBUMIN: 4.4 g/dL (ref 3.6–4.8)
ALK PHOS: 202 IU/L — AB (ref 39–117)
ALT: 63 IU/L — ABNORMAL HIGH (ref 0–32)
AST: 41 IU/L — ABNORMAL HIGH (ref 0–40)
Albumin/Globulin Ratio: 2 (ref 1.2–2.2)
BILIRUBIN TOTAL: 0.3 mg/dL (ref 0.0–1.2)
BUN / CREAT RATIO: 30 — AB (ref 12–28)
BUN: 24 mg/dL (ref 8–27)
CHLORIDE: 97 mmol/L (ref 96–106)
CO2: 28 mmol/L (ref 18–29)
CREATININE: 0.8 mg/dL (ref 0.57–1.00)
Calcium: 9.8 mg/dL (ref 8.7–10.3)
GFR calc Af Amer: 91 mL/min/{1.73_m2} (ref >59)
GFR calc non Af Amer: 79 mL/min/{1.73_m2} (ref >59)
GLOBULIN, TOTAL: 2.2 g/dL (ref 1.5–4.5)
Glucose: 98 mg/dL (ref 65–99)
Potassium: 4.1 mmol/L (ref 3.5–5.2)
SODIUM: 139 mmol/L (ref 134–144)
Total Protein: 6.6 g/dL (ref 6.0–8.5)

## 2015-11-29 LAB — LIPID PANEL
CHOLESTEROL TOTAL: 221 mg/dL — AB (ref 100–199)
Chol/HDL Ratio: 3.3 ratio units (ref 0.0–4.4)
HDL: 66 mg/dL (ref >39)
LDL CALC: 119 mg/dL — AB (ref 0–99)
Triglycerides: 179 mg/dL — ABNORMAL HIGH (ref 0–149)
VLDL Cholesterol Cal: 36 mg/dL (ref 5–40)

## 2015-12-01 ENCOUNTER — Telehealth: Payer: Self-pay | Admitting: Nurse Practitioner

## 2015-12-04 ENCOUNTER — Telehealth: Payer: Self-pay | Admitting: *Deleted

## 2015-12-04 NOTE — Telephone Encounter (Signed)
Pt notified of results Verbalizes understanding 

## 2015-12-04 NOTE — Telephone Encounter (Signed)
-----   Message from [facility], [place] sent at 12/01/2015  2:03 PM EST ----- Kidney and liver function stable Alkaline phosphatase increasing- avoid taking tylenol LDL are increasing again Urine results discussed at appointment

## 2015-12-05 ENCOUNTER — Telehealth: Payer: Self-pay | Admitting: Nurse Practitioner

## 2015-12-05 NOTE — Telephone Encounter (Signed)
Aware  Tylenol affects kidney and liver results.

## 2015-12-27 DIAGNOSIS — J329 Chronic sinusitis, unspecified: Secondary | ICD-10-CM | POA: Diagnosis not present

## 2015-12-27 DIAGNOSIS — R6889 Other general symptoms and signs: Secondary | ICD-10-CM | POA: Diagnosis not present

## 2015-12-31 ENCOUNTER — Encounter: Payer: Commercial Managed Care - HMO | Admitting: *Deleted

## 2015-12-31 DIAGNOSIS — Z1231 Encounter for screening mammogram for malignant neoplasm of breast: Secondary | ICD-10-CM | POA: Diagnosis not present

## 2016-01-01 ENCOUNTER — Telehealth: Payer: Self-pay | Admitting: Nurse Practitioner

## 2016-01-01 NOTE — Telephone Encounter (Signed)
No has to be sen to discuss

## 2016-01-01 NOTE — Telephone Encounter (Signed)
appt scheduled

## 2016-01-01 NOTE — Telephone Encounter (Signed)
Please review and advise.

## 2016-01-02 ENCOUNTER — Encounter: Payer: Self-pay | Admitting: Nurse Practitioner

## 2016-01-02 ENCOUNTER — Ambulatory Visit (INDEPENDENT_AMBULATORY_CARE_PROVIDER_SITE_OTHER): Payer: Commercial Managed Care - HMO | Admitting: Nurse Practitioner

## 2016-01-02 VITALS — BP 132/84 | HR 103 | Temp 97.1°F | Ht 61.0 in | Wt 140.0 lb

## 2016-01-02 DIAGNOSIS — Z7189 Other specified counseling: Secondary | ICD-10-CM | POA: Diagnosis not present

## 2016-01-02 MED ORDER — TRAMADOL HCL 50 MG PO TABS: 50.0000 mg | ORAL_TABLET | Freq: Two times a day (BID) | ORAL | 0 refills | Status: DC

## 2016-01-02 MED ORDER — TRAMADOL HCL 50 MG PO TABS: 50.0000 mg | ORAL_TABLET | Freq: Three times a day (TID) | ORAL | 0 refills | Status: DC | PRN

## 2016-01-02 NOTE — Progress Notes (Signed)
   Subjective:    Patient ID: [name], female    DOB: 28-Mar-1950, 65 y.o.   MRN: [id number]  HPI Patient comes in today fr pain management: Pain assessment: Cause of pain- no known cause- denies injury Pain location- low back pain and lower ext pain- possible fibromyalgia Pain on scale of 1-10- 8-10/10 Frequency- daily What increases pain-activity What makes pain Better-sitting and resting Effects on ADL - able to do ADL's but just takes her awhile Any change in general medical condition-none  Current medications- lortab 5/325- just recently started on these in November Effectiveness of current meds-does not work as well for her as tramadol Adverse reactions form pain meds-none Morphine equivalent10  Pill count performed-No Urine drug screen- No Was the [place] reviewed- not today      Review of Systems  Constitutional: Negative.   HENT: Negative.   Respiratory: Negative.   Cardiovascular: Negative.   Musculoskeletal: Positive for back pain and myalgias.  Neurological: Negative.   Psychiatric/Behavioral: Negative.   All other systems reviewed and are negative.      Objective:   Physical Exam  Constitutional: She is oriented to person, place, and time. She appears well-developed and well-nourished.  Cardiovascular: Normal rate and regular rhythm.   Pulmonary/Chest: Effort normal and breath sounds normal.  Musculoskeletal:  Decrease ROM of lumbar sine due to pain on flexion and extension (-) SR bil Motor strength and sensation distally intact  Neurological: She is alert and oriented to person, place, and time.  Skin: Skin is warm.  Psychiatric: She has a normal mood and affect. Her behavior is normal. Judgment and thought content normal.   BP 132/84 (BP Location: Left Arm, Cuff Size: Normal)   Pulse (!) 103   Temp 97.1 F (36.2 C) (Oral)   Ht 5\' 1"  (1.549 m)   Wt 140 lb (63.5 kg)   BMI 26.45 kg/m       Assessment & Plan:  1. Encounter for pain management  counseling Stopped lortab- called pharmacy and had them cancel remaining rx for lortab that they had on hold Exercise will help with pain if will do RTO prn Meds ordered this encounter  Medications  . traMADol (ULTRAM) 50 MG tablet    Sig: Take 1 tablet (50 mg total) by mouth 2 (two) times daily.    Dispense:  60 tablet    Refill:  0    Order Specific Question:   Supervising Provider    Answer:   VINCENT, CAROL L [[id number]]  . traMADol (ULTRAM) 50 MG tablet    Sig: Take 1 tablet (50 mg total) by mouth 2 (two) times daily.    Dispense:  60 tablet    Refill:  0    DO NOT FILL TILL 02/01/16    Order Specific Question:   Supervising Provider    Answer:   Assunta Found L [[id number]]  . traMADol (ULTRAM) 50 MG tablet    Sig: Take 1 tablet (50 mg total) by mouth every 8 (eight) hours as needed.    Dispense:  30 tablet    Refill:  0    DO NOT FILL TILL 03/02/16    Order Specific Question:   Supervising Provider    Answer:   Eustaquio Maize [[id number]]    Kailah-Margaret Hassell Done, FNP

## 2016-01-02 NOTE — Patient Instructions (Signed)

## 2016-01-13 DIAGNOSIS — M79671 Pain in right foot: Secondary | ICD-10-CM | POA: Diagnosis not present

## 2016-01-13 DIAGNOSIS — M722 Plantar fascial fibromatosis: Secondary | ICD-10-CM | POA: Diagnosis not present

## 2016-01-16 ENCOUNTER — Encounter: Payer: Self-pay | Admitting: Nurse Practitioner

## 2016-01-16 DIAGNOSIS — R928 Other abnormal and inconclusive findings on diagnostic imaging of breast: Secondary | ICD-10-CM | POA: Diagnosis not present

## 2016-01-16 DIAGNOSIS — R922 Inconclusive mammogram: Secondary | ICD-10-CM | POA: Diagnosis not present

## 2016-01-16 DIAGNOSIS — N6489 Other specified disorders of breast: Secondary | ICD-10-CM | POA: Diagnosis not present

## 2016-01-21 ENCOUNTER — Other Ambulatory Visit: Payer: Self-pay | Admitting: Nurse Practitioner

## 2016-02-10 DIAGNOSIS — M79671 Pain in right foot: Secondary | ICD-10-CM | POA: Diagnosis not present

## 2016-02-10 DIAGNOSIS — M76821 Posterior tibial tendinitis, right leg: Secondary | ICD-10-CM | POA: Diagnosis not present

## 2016-02-10 DIAGNOSIS — M722 Plantar fascial fibromatosis: Secondary | ICD-10-CM | POA: Diagnosis not present

## 2016-03-05 ENCOUNTER — Encounter: Payer: Self-pay | Admitting: Nurse Practitioner

## 2016-03-05 ENCOUNTER — Ambulatory Visit (INDEPENDENT_AMBULATORY_CARE_PROVIDER_SITE_OTHER): Payer: Medicare HMO | Admitting: Nurse Practitioner

## 2016-03-05 VITALS — BP 148/88 | HR 91 | Temp 97.4°F | Ht 61.0 in | Wt 149.0 lb

## 2016-03-05 DIAGNOSIS — F5101 Primary insomnia: Secondary | ICD-10-CM

## 2016-03-05 DIAGNOSIS — M8580 Other specified disorders of bone density and structure, unspecified site: Secondary | ICD-10-CM | POA: Diagnosis not present

## 2016-03-05 DIAGNOSIS — Z6827 Body mass index (BMI) 27.0-27.9, adult: Secondary | ICD-10-CM | POA: Diagnosis not present

## 2016-03-05 DIAGNOSIS — I10 Essential (primary) hypertension: Secondary | ICD-10-CM

## 2016-03-05 DIAGNOSIS — K219 Gastro-esophageal reflux disease without esophagitis: Secondary | ICD-10-CM | POA: Diagnosis not present

## 2016-03-05 DIAGNOSIS — Z7189 Other specified counseling: Secondary | ICD-10-CM

## 2016-03-05 DIAGNOSIS — M545 Low back pain, unspecified: Secondary | ICD-10-CM

## 2016-03-05 DIAGNOSIS — E7800 Pure hypercholesterolemia, unspecified: Secondary | ICD-10-CM | POA: Diagnosis not present

## 2016-03-05 DIAGNOSIS — K5730 Diverticulosis of large intestine without perforation or abscess without bleeding: Secondary | ICD-10-CM | POA: Diagnosis not present

## 2016-03-05 DIAGNOSIS — M5136 Other intervertebral disc degeneration, lumbar region: Secondary | ICD-10-CM | POA: Diagnosis not present

## 2016-03-05 DIAGNOSIS — G8929 Other chronic pain: Secondary | ICD-10-CM

## 2016-03-05 DIAGNOSIS — F411 Generalized anxiety disorder: Secondary | ICD-10-CM

## 2016-03-05 MED ORDER — ALPRAZOLAM 0.25 MG PO TABS: 0.2500 mg | ORAL_TABLET | Freq: Every day | ORAL | 1 refills | Status: DC

## 2016-03-05 MED ORDER — TRAMADOL HCL 50 MG PO TABS: 50.0000 mg | ORAL_TABLET | Freq: Two times a day (BID) | ORAL | 0 refills | Status: DC

## 2016-03-05 MED ORDER — ENALAPRIL MALEATE 20 MG PO TABS: 20.0000 mg | ORAL_TABLET | Freq: Every day | ORAL | 1 refills | Status: DC

## 2016-03-05 MED ORDER — OMEPRAZOLE 40 MG PO CPDR: 40.0000 mg | DELAYED_RELEASE_CAPSULE | Freq: Every day | ORAL | 1 refills | Status: DC

## 2016-03-05 MED ORDER — TRAMADOL HCL 50 MG PO TABS: 50.0000 mg | ORAL_TABLET | Freq: Three times a day (TID) | ORAL | 0 refills | Status: DC | PRN

## 2016-03-05 MED ORDER — HYDROCHLOROTHIAZIDE 25 MG PO TABS: 25.0000 mg | ORAL_TABLET | Freq: Every day | ORAL | 1 refills | Status: DC

## 2016-03-05 MED ORDER — SIMVASTATIN 20 MG PO TABS: 20.0000 mg | ORAL_TABLET | Freq: Every day | ORAL | 1 refills | Status: DC

## 2016-03-05 NOTE — Patient Instructions (Signed)
Fall Prevention in the Home Falls can cause injuries. They can happen to people of all ages. There are many things you can do to make your home safe and to help prevent falls. What can I do on the outside of my home?  Regularly fix the edges of walkways and driveways and fix any cracks.  Remove anything that might make you trip as you walk through a door, such as a raised step or threshold.  Trim any bushes or trees on the path to your home.  Use bright outdoor lighting.  Clear any walking paths of anything that might make someone trip, such as rocks or tools.  Regularly check to see if handrails are loose or broken. Make sure that both sides of any steps have handrails.  Any raised decks and porches should have guardrails on the edges.  Have any leaves, snow, or ice cleared regularly.  Use sand or salt on walking paths during winter.  Clean up any spills in your garage right away. This includes oil or grease spills. What can I do in the bathroom?  Use night lights.  Install grab bars by the toilet and in the tub and shower. Do not use towel bars as grab bars.  Use non-skid mats or decals in the tub or shower.  If you need to sit down in the shower, use a plastic, non-slip stool.  Keep the floor dry. Clean up any water that spills on the floor as soon as it happens.  Remove soap buildup in the tub or shower regularly.  Attach bath mats securely with double-sided non-slip rug tape.  Do not have throw rugs and other things on the floor that can make you trip. What can I do in the bedroom?  Use night lights.  Make sure that you have a light by your bed that is easy to reach.  Do not use any sheets or blankets that are too big for your bed. They should not hang down onto the floor.  Have a firm chair that has side arms. You can use this for support while you get dressed.  Do not have throw rugs and other things on the floor that can make you trip. What can I do in the  kitchen?  Clean up any spills right away.  Avoid walking on wet floors.  Keep items that you use a lot in easy-to-reach places.  If you need to reach something above you, use a strong step stool that has a grab bar.  Keep electrical cords out of the way.  Do not use floor polish or wax that makes floors slippery. If you must use wax, use non-skid floor wax.  Do not have throw rugs and other things on the floor that can make you trip. What can I do with my stairs?  Do not leave any items on the stairs.  Make sure that there are handrails on both sides of the stairs and use them. Fix handrails that are broken or loose. Make sure that handrails are as long as the stairways.  Check any carpeting to make sure that it is firmly attached to the stairs. Fix any carpet that is loose or worn.  Avoid having throw rugs at the top or bottom of the stairs. If you do have throw rugs, attach them to the floor with carpet tape.  Make sure that you have a light switch at the top of the stairs and the bottom of the stairs. If you do   not have them, ask someone to add them for you. What else can I do to help prevent falls?  Wear shoes that:  Do not have high heels.  Have rubber bottoms.  Are comfortable and fit you well.  Are closed at the toe. Do not wear sandals.  If you use a stepladder:  Make sure that it is fully opened. Do not climb a closed stepladder.  Make sure that both sides of the stepladder are locked into place.  Ask someone to hold it for you, if possible.  Clearly mark and make sure that you can see:  Any grab bars or handrails.  First and last steps.  Where the edge of each step is.  Use tools that help you move around (mobility aids) if they are needed. These include:  Canes.  Walkers.  Scooters.  Crutches.  Turn on the lights when you go into a dark area. Replace any light bulbs as soon as they burn out.  Set up your furniture so you have a clear path.  Avoid moving your furniture around.  If any of your floors are uneven, fix them.  If there are any pets around you, be aware of where they are.  Review your medicines with your doctor. Some medicines can make you feel dizzy. This can increase your chance of falling. Ask your doctor what other things that you can do to help prevent falls. This information is not intended to replace advice given to you by your health care provider. Make sure you discuss any questions you have with your health care provider. Document Released: 10/17/2008 Document Revised: 05/29/2015 Document Reviewed: 01/25/2014 Elsevier Interactive Patient Education  2017 [employer].  

## 2016-03-05 NOTE — Progress Notes (Signed)
Subjective:    Patient ID: Maria Moss, female    DOB: Aug 23, 1950, 66 y.o.   MRN: [id number]  Patient is here today for follow up of chronic medical problems.  Current Outpatient Prescriptions on File Prior to Visit  Medication Sig Dispense Refill  . Cholecalciferol (VITAMIN D-1000 MAX ST) 1000 units tablet Take by mouth.    . fluticasone (FLONASE) 50 MCG/ACT nasal spray USE 2 SPRAYS IN EACH NOSTRIL EVERY DAY 48 g 0  . hydrochlorothiazide (HYDRODIURIL) 25 MG tablet Take 1 tablet (25 mg total) by mouth daily. 90 tablet 1  . hyoscyamine (ANASPAZ) 0.125 MG TBDP disintergrating tablet Take 1 tablet (0.125 mg total) by mouth every 6 (six) hours as needed. 30 tablet 2  . loratadine (CLARITIN) 10 MG tablet Take 10 mg by mouth daily.    . Omega-3 Fatty Acids (FISH OIL) 1000 MG CAPS Take by mouth.    Marland Kitchen omeprazole (PRILOSEC) 40 MG capsule Take 1 capsule (40 mg total) by mouth daily. 90 capsule 1  . Probiotic Product (PROBIOTIC & ACIDOPHILUS EX ST PO) Take by mouth.    . simvastatin (ZOCOR) 20 MG tablet Take 1 tablet (20 mg total) by mouth at bedtime. 90 tablet 1  . traMADol (ULTRAM) 50 MG tablet Take 1 tablet (50 mg total) by mouth 2 (two) times daily. 60 tablet 0  . traMADol (ULTRAM) 50 MG tablet Take 1 tablet (50 mg total) by mouth 2 (two) times daily. 60 tablet 0  . traMADol (ULTRAM) 50 MG tablet Take 1 tablet (50 mg total) by mouth every 8 (eight) hours as needed. 30 tablet 0  . cyclobenzaprine (FLEXERIL) 10 MG tablet Take 1 tablet (10 mg total) by mouth 3 (three) times daily as needed. for muscle spams (Patient not taking: Reported on 03/05/2016) 90 tablet 0   No current facility-administered medications on file prior to visit.     Hypertension  This is a chronic problem. The current episode started more than 1 year ago. The problem is unchanged. The problem is uncontrolled. Associated symptoms include anxiety. Pertinent negatives include no chest pain, palpitations or shortness of breath. Risk  factors for coronary artery disease include diabetes mellitus, dyslipidemia, post-menopausal state and sedentary lifestyle. Past treatments include diuretics and ACE inhibitors. The current treatment provides moderate improvement. Compliance problems include diet and exercise.   Hyperlipidemia  This is a chronic problem. The current episode started more than 1 year ago. Recent lipid tests were reviewed and are variable. She has no history of diabetes, hypothyroidism or obesity. Pertinent negatives include no chest pain or shortness of breath. Current antihyperlipidemic treatment includes statins. The current treatment provides moderate improvement of lipids. Compliance problems include adherence to diet and adherence to exercise.  Risk factors for coronary artery disease include dyslipidemia, hypertension and post-menopausal.  Anxiety  Presents for follow-up visit. Patient reports no chest pain, palpitations or shortness of breath. Symptoms occur most days. The severity of symptoms is moderate. Nothing aggravates the symptoms. The quality of sleep is good. Nighttime awakenings: none.    IBS/diverticulosis Use levsin when needed for cramping- uses PRN. She is seeing Dr. Carlton Adam. No recent flare ups GERD Omeprazole daily- keeps symptoms under control DDD Was diagnosed by S. Weeks and has been taking ultram for- she says that the pain is worsening in her lower back. Pain radiates down her left leg- no weakness that she has noted. Pain assessment: Cause of pain- DDD Pain location- low back mainly sometimes radiates down leg but not  all the time Pain on scale of 1-10- 8/10 Frequency- daily- mostly all day What increases pain- sitting or standing for long periods of time What makes pain Better- changing positions helps some Effects on ADL - able to do ADL's Any change in general medical condition-Dx with plantar faciisitis  Current medications- ultram 50mg  BID Effectiveness of current meds- brings  down to 7/10 Adverse reactions form pain meds-none Morphine equivalent>10  Pill count performed-No Urine drug screen- No Was the [facility] reviewed- yes  If yes were their any concerning findings? - no suspicious activity  Osteopenia Still is having some lower back pain.  insomnia Does not have every night.- has tried all sleep aids and they make her hyper. She use to take xanax on occasion to sleep and would like to do again- Discussed not liking her taking it with pain meds.  Review of Systems  Constitutional: Negative.   HENT: Negative.   Eyes: Negative.   Respiratory: Negative.  Negative for shortness of breath.   Cardiovascular: Negative.  Negative for chest pain and palpitations.  Gastrointestinal: Negative.   Endocrine: Negative.   Genitourinary: Negative.   Musculoskeletal: Negative.   Skin: Negative.   Allergic/Immunologic: Negative.   Neurological: Negative.   Hematological: Negative.   Psychiatric/Behavioral: Negative.   All other systems reviewed and are negative.      Objective:   Physical Exam  Constitutional: She is oriented to person, place, and time. She appears well-developed and well-nourished.  HENT:  Head: Normocephalic.  Right Ear: Hearing, tympanic membrane, external ear and ear canal normal.  Left Ear: Hearing, tympanic membrane, external ear and ear canal normal.  Nose: Nose normal.  Mouth/Throat: Uvula is midline and oropharynx is clear and moist.  Eyes: Conjunctivae and EOM are normal. Pupils are equal, round, and reactive to light.  Neck: Normal range of motion and full passive range of motion without pain. Neck supple. No JVD present. Carotid bruit is not present. No thyroid mass and no thyromegaly present.  Cardiovascular: Normal rate, regular rhythm, normal heart sounds and intact distal pulses.   No murmur heard. Pulmonary/Chest: Effort normal and breath sounds normal.  Abdominal: Soft. Bowel sounds are normal. She exhibits no distension and  no mass. There is no tenderness.  Genitourinary: No breast swelling, tenderness, discharge or bleeding.  Musculoskeletal: Normal range of motion. She exhibits no edema.  Lymphadenopathy:    She has no cervical adenopathy.  Neurological: She is alert and oriented to person, place, and time.  Skin: Skin is warm and dry.  Psychiatric: She has a normal mood and affect. Her behavior is normal. Judgment and thought content normal.  Vitals reviewed.  BP (!) 148/88   Pulse 91   Temp 97.4 F (36.3 C) (Oral)   Ht 5\' 1"  (1.549 m)   Wt 149 lb (67.6 kg)   BMI 28.15 kg/m       Assessment & Plan:   1. Essential hypertension Low sodium diet - hydrochlorothiazide (HYDRODIURIL) 25 MG tablet; Take 1 tablet (25 mg total) by mouth daily.  Dispense: 90 tablet; Refill: 1 - enalapril (VASOTEC) 20 MG tablet; Take 1 tablet (20 mg total) by mouth daily. Take 1 1/2 tabs daily  Dispense: 135 tablet; Refill: 1  2. Diverticulosis of large intestine without hemorrhage Keep follow up with gastroenerology  3. Degenerative disc disease, lumbar  4. Osteopenia, unspecified location  5. BMI 27.0-27.9,adult Discussed diet and exercise for person with BMI >25 Will recheck weight in 3-6 months  6. Chronic  midline low back pain without sciatica Back stretching exercises - traMADol (ULTRAM) 50 MG tablet; Take 1 tablet (50 mg total) by mouth every 8 (eight) hours as needed.  Dispense: 60 tablet; Refill: 0 - traMADol (ULTRAM) 50 MG tablet; Take 1 tablet (50 mg total) by mouth 2 (two) times daily.  Dispense: 60 tablet; Refill: 0 - traMADol (ULTRAM) 50 MG tablet; Take 1 tablet (50 mg total) by mouth 2 (two) times daily.  Dispense: 60 tablet; Refill: 0  7. GAD (generalized anxiety disorder) Stress management  8. Pure hypercholesterolemia Low cholesterol diet - simvastatin (ZOCOR) 20 MG tablet; Take 1 tablet (20 mg total) by mouth at bedtime.  Dispense: 90 tablet; Refill: 1  9. Encounter for pain management  counseling  10. Gastroesophageal reflux disease without esophagitis Avoid spicy foods Do not eat 2 hours prior to bedtime - omeprazole (PRILOSEC) 40 MG capsule; Take 1 capsule (40 mg total) by mouth daily.  Dispense: 90 capsule; Refill: 1  11. Primary insomnia Bedtime routine Do nt take xanax every night if can help it - ALPRAZolam (XANAX) 0.25 MG tablet; Take 1 tablet (0.25 mg total) by mouth at bedtime.  Dispense: 30 tablet; Refill: 1    Labs pending Health maintenance reviewed Diet and exercise encouraged Continue all meds Follow up  In 3 months   [place], FNP

## 2016-03-06 LAB — CMP14+EGFR
A/G RATIO: 1.8 (ref 1.2–2.2)
ALT: 24 IU/L (ref 0–32)
AST: 21 IU/L (ref 0–40)
Albumin: 4.5 g/dL (ref 3.6–4.8)
Alkaline Phosphatase: 120 IU/L — ABNORMAL HIGH (ref 39–117)
BILIRUBIN TOTAL: 0.5 mg/dL (ref 0.0–1.2)
BUN/Creatinine Ratio: 24 (ref 12–28)
BUN: 16 mg/dL (ref 8–27)
CHLORIDE: 97 mmol/L (ref 96–106)
CO2: 28 mmol/L (ref 18–29)
Calcium: 10.3 mg/dL (ref 8.7–10.3)
Creatinine, Ser: 0.67 mg/dL (ref 0.57–1.00)
GFR, EST AFRICAN AMERICAN: 107 mL/min/{1.73_m2} (ref >59)
GFR, EST NON AFRICAN AMERICAN: 93 mL/min/{1.73_m2} (ref >59)
GLOBULIN, TOTAL: 2.5 g/dL (ref 1.5–4.5)
Glucose: 89 mg/dL (ref 65–99)
POTASSIUM: 4.5 mmol/L (ref 3.5–5.2)
SODIUM: 142 mmol/L (ref 134–144)
TOTAL PROTEIN: 7 g/dL (ref 6.0–8.5)

## 2016-03-06 LAB — LIPID PANEL
CHOL/HDL RATIO: 2.8 ratio (ref 0.0–4.4)
Cholesterol, Total: 197 mg/dL (ref 100–199)
HDL: 71 mg/dL (ref >39)
LDL Calculated: 89 mg/dL (ref 0–99)
Triglycerides: 183 mg/dL — ABNORMAL HIGH (ref 0–149)
VLDL Cholesterol Cal: 37 mg/dL (ref 5–40)

## 2016-03-09 DIAGNOSIS — I4892 Unspecified atrial flutter: Secondary | ICD-10-CM | POA: Diagnosis not present

## 2016-03-09 DIAGNOSIS — I10 Essential (primary) hypertension: Secondary | ICD-10-CM | POA: Diagnosis not present

## 2016-03-09 DIAGNOSIS — M4726 Other spondylosis with radiculopathy, lumbar region: Secondary | ICD-10-CM | POA: Diagnosis not present

## 2016-03-09 DIAGNOSIS — M533 Sacrococcygeal disorders, not elsewhere classified: Secondary | ICD-10-CM | POA: Diagnosis not present

## 2016-03-09 DIAGNOSIS — R1310 Dysphagia, unspecified: Secondary | ICD-10-CM | POA: Diagnosis not present

## 2016-03-11 ENCOUNTER — Telehealth: Payer: Self-pay | Admitting: Nurse Practitioner

## 2016-03-11 DIAGNOSIS — I10 Essential (primary) hypertension: Secondary | ICD-10-CM

## 2016-03-12 MED ORDER — ENALAPRIL MALEATE 20 MG PO TABS: 30.0000 mg | ORAL_TABLET | Freq: Every day | ORAL | 1 refills | Status: DC

## 2016-03-12 NOTE — Telephone Encounter (Signed)
done

## 2016-03-15 ENCOUNTER — Other Ambulatory Visit: Payer: Self-pay | Admitting: Nurse Practitioner

## 2016-03-15 DIAGNOSIS — I10 Essential (primary) hypertension: Secondary | ICD-10-CM

## 2016-03-15 MED ORDER — ENALAPRIL MALEATE 20 MG PO TABS: 30.0000 mg | ORAL_TABLET | Freq: Every day | ORAL | 1 refills | Status: DC

## 2016-03-16 ENCOUNTER — Telehealth: Payer: Self-pay | Admitting: Nurse Practitioner

## 2016-03-16 DIAGNOSIS — I10 Essential (primary) hypertension: Secondary | ICD-10-CM

## 2016-03-17 MED ORDER — ENALAPRIL MALEATE 20 MG PO TABS: 30.0000 mg | ORAL_TABLET | Freq: Every day | ORAL | 1 refills | Status: DC

## 2016-03-17 NOTE — Telephone Encounter (Signed)
Rx sent to correct pharmacy. Patient aware.  

## 2016-03-23 DIAGNOSIS — M79671 Pain in right foot: Secondary | ICD-10-CM | POA: Diagnosis not present

## 2016-03-23 DIAGNOSIS — M76821 Posterior tibial tendinitis, right leg: Secondary | ICD-10-CM | POA: Diagnosis not present

## 2016-03-23 DIAGNOSIS — M2140 Flat foot [pes planus] (acquired), unspecified foot: Secondary | ICD-10-CM | POA: Diagnosis not present

## 2016-03-26 ENCOUNTER — Other Ambulatory Visit: Payer: Self-pay | Admitting: Nurse Practitioner

## 2016-05-07 ENCOUNTER — Telehealth: Payer: Self-pay | Admitting: Nurse Practitioner

## 2016-05-07 ENCOUNTER — Other Ambulatory Visit: Payer: Self-pay | Admitting: Nurse Practitioner

## 2016-05-07 DIAGNOSIS — K5730 Diverticulosis of large intestine without perforation or abscess without bleeding: Secondary | ICD-10-CM

## 2016-05-07 MED ORDER — HYOSCYAMINE SULFATE 0.125 MG PO TBDP: 0.1250 mg | ORAL_TABLET | Freq: Four times a day (QID) | ORAL | 2 refills | Status: DC | PRN

## 2016-05-07 NOTE — Telephone Encounter (Signed)
What is the name of the medication? Hyoscyamine and generic flexeril  Have you contacted your pharmacy to request a refill? No. She has not had these for a while.   Which pharmacy would you like this sent to[facility] [place]   Patient notified that their request is being sent to the clinical staff for review and that they should receive a call once it is complete. If they do not receive a call within 24 hours they can check with their pharmacy or our office.

## 2016-05-14 ENCOUNTER — Encounter: Payer: Self-pay | Admitting: Nurse Practitioner

## 2016-05-14 ENCOUNTER — Ambulatory Visit (INDEPENDENT_AMBULATORY_CARE_PROVIDER_SITE_OTHER): Payer: Medicare HMO

## 2016-05-14 ENCOUNTER — Ambulatory Visit (INDEPENDENT_AMBULATORY_CARE_PROVIDER_SITE_OTHER): Payer: Medicare HMO | Admitting: Nurse Practitioner

## 2016-05-14 VITALS — BP 151/91 | HR 94 | Temp 96.8°F | Ht 61.0 in | Wt 150.6 lb

## 2016-05-14 DIAGNOSIS — M25552 Pain in left hip: Secondary | ICD-10-CM

## 2016-05-14 MED ORDER — PREDNISONE 20 MG PO TABS: ORAL_TABLET | ORAL | 0 refills | Status: DC

## 2016-05-14 NOTE — Patient Instructions (Signed)
Hip Pain The hip is the joint between the upper legs and the lower pelvis. The bones, cartilage, tendons, and muscles of your hip joint support your body and allow you to move around. Hip pain can range from a minor ache to severe pain in one or both of your hips. The pain may be felt on the inside of the hip joint near the groin, or the outside near the buttocks and upper thigh. You may also have swelling or stiffness. Follow these instructions at home: Managing pain, stiffness, and swelling   If directed, apply ice to the injured area.  Put ice in a plastic bag.  Place a towel between your skin and the bag.  Leave the ice on for 20 minutes, 2-3 times a day  Sleep with a pillow between your legs on your most comfortable side.  Avoid any activities that cause pain. General instructions   Take over-the-counter and prescription medicines only as told by your health care provider.  Do any exercises as told by your health care provider.  Record the following:  How often you have hip pain.  The location of your pain.  What the pain feels like.  What makes the pain worse.  Keep all follow-up visits as told by your health care provider. This is important. Contact a health care provider if:  You cannot put weight on your leg.  Your pain or swelling continues or gets worse after one week.  It gets harder to walk.  You have a fever. Get help right away if:  You fall.  You have a sudden increase in pain and swelling in your hip.  Your hip is red or swollen or very tender to touch. Summary  Hip pain can range from a minor ache to severe pain in one or both of your hips.  The pain may be felt on the inside of the hip joint near the groin, or the outside near the buttocks and upper thigh.  Avoid any activities that cause pain.  Record how often you have hip pain, the location of the pain, what makes it worse and what it feels like. This information is not intended to  replace advice given to you by your health care provider. Make sure you discuss any questions you have with your health care provider. Document Released: 06/10/2009 Document Revised: 11/24/2015 Document Reviewed: 11/24/2015 Elsevier Interactive Patient Education  2017 [employer] Inc[employer]  

## 2016-05-14 NOTE — Progress Notes (Signed)
   Subjective:    Patient ID: Maria Moss, female    DOB: August 04, 1950, 66 y.o.   MRN: [id number]  HPI Patient come sin today /o of left hip pain. Denies any injury. Started about 2 weeks ago but has gtten worse over the last several days. Currently rates pain 6/10. Nothing seems to help it. Bending over increases pain. SHe is on tramadol BID and mobic for back pain.   Review of Systems  Constitutional: Negative.   Respiratory: Negative.   Cardiovascular: Negative.   Musculoskeletal: Positive for arthralgias.  Neurological: Negative.   Psychiatric/Behavioral: Negative.   All other systems reviewed and are negative.      Objective:   Physical Exam  Constitutional: She is oriented to person, place, and time. She appears well-developed and well-nourished. No distress.  Cardiovascular: Normal rate.   Pulmonary/Chest: Effort normal and breath sounds normal.  Musculoskeletal:  Left hip pain on palpation FROM with pain on extension and abduction.  Neurological: She is alert and oriented to person, place, and time.   BP (!) 151/91   Pulse 94   Temp (!) 96.8 F (36 C) (Oral)   Ht 5\' 1"  (1.549 m)   Wt 150 lb 9.6 oz (68.3 kg)   BMI 28.46 kg/m   Left hip xray- osteoarthritis with joint space narrowing-Preliminary reading by Ronnald Collum, FNP  [facility]      Assessment & Plan:  1. Left hip pain moist heat to hip Rest Continue ultram and mobic - DG HIP UNILAT W OR W/O PELVIS 2-3 VIEWS LEFT; Future - Ambulatory referral to Orthopedic Surgery  Meds ordered this encounter  Medications  . meloxicam (MOBIC) 15 MG tablet    Sig: Take 15 mg by mouth daily.  . predniSONE (DELTASONE) 20 MG tablet    Sig: 2 po at sametime daily for 5 days    Dispense:  10 tablet    Refill:  0    Order Specific Question:   Supervising Provider    Answer:   Eustaquio Maize [[id number]]     Natanya-Margaret Hassell Done, FNP

## 2016-05-27 ENCOUNTER — Encounter (INDEPENDENT_AMBULATORY_CARE_PROVIDER_SITE_OTHER): Payer: Self-pay | Admitting: Orthopaedic Surgery

## 2016-05-27 ENCOUNTER — Ambulatory Visit (INDEPENDENT_AMBULATORY_CARE_PROVIDER_SITE_OTHER): Payer: Medicare HMO

## 2016-05-27 ENCOUNTER — Ambulatory Visit (INDEPENDENT_AMBULATORY_CARE_PROVIDER_SITE_OTHER): Payer: Medicare HMO | Admitting: Orthopaedic Surgery

## 2016-05-27 VITALS — BP 139/86 | HR 98 | Ht 61.0 in | Wt 150.0 lb

## 2016-05-27 DIAGNOSIS — G8929 Other chronic pain: Secondary | ICD-10-CM | POA: Diagnosis not present

## 2016-05-27 DIAGNOSIS — M545 Low back pain: Secondary | ICD-10-CM | POA: Diagnosis not present

## 2016-05-27 DIAGNOSIS — M7062 Trochanteric bursitis, left hip: Secondary | ICD-10-CM | POA: Diagnosis not present

## 2016-05-27 NOTE — Progress Notes (Signed)
Office Visit Note   Patient: Maria Moss           Date of Birth: August 12, 1950           MRN: [id number] Visit Date: 05/27/2016              Requested by: Chevis Pretty, [address] [place], [place] [address] PCP: Chevis Pretty, FNP   Assessment & Plan: Visit Diagnoses:  1. Chronic bilateral low back pain, with sciatica presence unspecified   2. Trochanteric bursitis, left hip     Plan: History of polio with left trochanteric bursitis. Trochanteric injection performed with good relief postinjection. We will follow her up in 4 weeks if she has persistent problems we may need to consider diagnostic imaging of her back. She was walking much more comfortably after the injection.  Follow-Up Instructions: Return in about 4 weeks (around 06/24/2016).   Orders:  Orders Placed This Encounter  Procedures  . Large Joint Injection/Arthrocentesis  . XR Lumbar Spine 2-3 Views   No orders of the defined types were placed in this encounter.     Procedures: Large Joint Inj Date/Time: 05/27/2016 11:56 AM Performed by: Marybelle Killings Authorized by: Rodell Perna C   Location:  Hip Site:  L greater trochanter Ultrasound Guidance: No   Fluoroscopic Guidance: No   Arthrogram: No   Medications:  2 mL bupivacaine 0.25 %; 0.5 mL lidocaine 1 %; 40 mg methylPREDNISolone acetate 40 MG/ML      Clinical Data: No additional findings.   Subjective: Chief Complaint  Patient presents with  . Lower Back - Pain  . Left Hip - Pain    HPI 66 year old female with the left lateral hip pain associated back pain. She's had increased difficulty walking for the last month. No specific injury, insidious onset. She had polio as a child right leg shorter. She's had difficulty walking. No associated bowel or bladder symptoms no fever or chills. She's used tramadol for the pain with slight improvement. Bending increases the pain.  Review of Systems 14 point review of systems positive  for hypertension, diverticulosis, anxiety disorder, hyperlipidemia, chronic low back pain, lateral left hip pain. She's been on a pain management contract with tramadol with her PCP.   Objective: Vital Signs: BP 139/86   Pulse 98   Ht 5\' 1"  (1.549 m)   Wt 150 lb (68 kg)   BMI 28.34 kg/m   Physical Exam  Constitutional: She is oriented to person, place, and time. She appears well-developed.  HENT:  Head: Normocephalic.  Right Ear: External ear normal.  Left Ear: External ear normal.  Eyes: Pupils are equal, round, and reactive to light.  Neck: No tracheal deviation present. No thyromegaly present.  Cardiovascular: Normal rate.   Pulmonary/Chest: Effort normal.  Abdominal: Soft.  Musculoskeletal:  Patient's care from sitting to standing she is able toward a slight limp. Mild leg length inequality with right leg shorter mild scoliosis. No sciatic notch tenderness negative straight leg raising 90 normal hip range of motion. She has exquisite tenderness of the left greater trochanter. Anterior tib EHL is intact she is able to heel and toe walk.  Neurological: She is alert and oriented to person, place, and time.  Skin: Skin is warm and dry.  Psychiatric: She has a normal mood and affect. Her behavior is normal.    Ortho Exam knee-ankle jerks were 2+ and symmetrical. No hip flexion weakness. Quads are strong. Exquisite tenderness of the left trochanter none  on the right.  Specialty Comments:  No specialty comments available.  Imaging: No results found.   PMFS History: Patient Active Problem List   Diagnosis Date Noted  . Trochanteric bursitis, left hip 06/01/2016  . Pain management contract agreement 11/28/2015  . Encounter for pain management counseling 04/11/2015  . Chronic low back pain 04/11/2015  . BMI 27.0-27.9,adult 11/29/2014  . Diverticulosis 10/24/2013  . Osteopenia 10/24/2013  . Degenerative disc disease, lumbar 07/21/2012  . Hyperlipemia 07/21/2012  . GAD  (generalized anxiety disorder) 07/21/2012  . Hypertension 07/21/2012   Past Medical History:  Diagnosis Date  . Ankle fracture, left 2006  . Diverticula, intestine    Mild case.  Marland Kitchen Hyperlipidemia   . Hypertension     Family History  Problem Relation Age of Onset  . Heart disease Mother        CHF  . Cancer Mother        Uterine  . Osteoporosis Mother   . COPD Sister   . Asthma Sister   . Migraines Sister     Past Surgical History:  Procedure Laterality Date  . ANKLE SURGERY Left    Broken.  Has screws and metal plate.  . CHOLECYSTECTOMY     Social History   Occupational History  . Not on file.   Social History Main Topics  . Smoking status: Never Smoker  . Smokeless tobacco: Never Used  . Alcohol use No  . Drug use: No  . Sexual activity: Not on file

## 2016-06-01 DIAGNOSIS — M7062 Trochanteric bursitis, left hip: Secondary | ICD-10-CM | POA: Insufficient documentation

## 2016-06-01 MED ORDER — LIDOCAINE HCL 1 % IJ SOLN
0.5000 mL | INTRAMUSCULAR | Status: AC | PRN
  Administered 2016-05-27: .5 mL

## 2016-06-01 MED ORDER — BUPIVACAINE HCL 0.25 % IJ SOLN
2.0000 mL | INTRAMUSCULAR | Status: AC | PRN
  Administered 2016-05-27: 2 mL via INTRA_ARTICULAR

## 2016-06-01 MED ORDER — METHYLPREDNISOLONE ACETATE 40 MG/ML IJ SUSP
40.0000 mg | INTRAMUSCULAR | Status: AC | PRN
  Administered 2016-05-27: 40 mg via INTRA_ARTICULAR

## 2016-06-09 ENCOUNTER — Other Ambulatory Visit: Payer: Self-pay | Admitting: Nurse Practitioner

## 2016-06-09 DIAGNOSIS — M545 Low back pain, unspecified: Secondary | ICD-10-CM

## 2016-06-09 DIAGNOSIS — G8929 Other chronic pain: Secondary | ICD-10-CM

## 2016-06-11 ENCOUNTER — Ambulatory Visit (INDEPENDENT_AMBULATORY_CARE_PROVIDER_SITE_OTHER): Payer: Medicare HMO | Admitting: Nurse Practitioner

## 2016-06-11 ENCOUNTER — Encounter: Payer: Self-pay | Admitting: Nurse Practitioner

## 2016-06-11 VITALS — BP 126/85 | HR 106 | Temp 98.5°F | Ht 61.0 in | Wt 150.8 lb

## 2016-06-11 DIAGNOSIS — M545 Low back pain: Secondary | ICD-10-CM | POA: Diagnosis not present

## 2016-06-11 DIAGNOSIS — F5101 Primary insomnia: Secondary | ICD-10-CM | POA: Diagnosis not present

## 2016-06-11 DIAGNOSIS — E7800 Pure hypercholesterolemia, unspecified: Secondary | ICD-10-CM

## 2016-06-11 DIAGNOSIS — K5730 Diverticulosis of large intestine without perforation or abscess without bleeding: Secondary | ICD-10-CM | POA: Diagnosis not present

## 2016-06-11 DIAGNOSIS — I10 Essential (primary) hypertension: Secondary | ICD-10-CM

## 2016-06-11 DIAGNOSIS — F411 Generalized anxiety disorder: Secondary | ICD-10-CM

## 2016-06-11 DIAGNOSIS — G8929 Other chronic pain: Secondary | ICD-10-CM

## 2016-06-11 DIAGNOSIS — M5136 Other intervertebral disc degeneration, lumbar region: Secondary | ICD-10-CM

## 2016-06-11 DIAGNOSIS — Z6827 Body mass index (BMI) 27.0-27.9, adult: Secondary | ICD-10-CM | POA: Diagnosis not present

## 2016-06-11 MED ORDER — TRAMADOL HCL 50 MG PO TABS: 50.0000 mg | ORAL_TABLET | Freq: Two times a day (BID) | ORAL | 0 refills | Status: DC

## 2016-06-11 MED ORDER — TRAMADOL HCL 50 MG PO TABS: 50.0000 mg | ORAL_TABLET | Freq: Three times a day (TID) | ORAL | 0 refills | Status: DC | PRN

## 2016-06-11 MED ORDER — ALPRAZOLAM 0.25 MG PO TABS: 0.2500 mg | ORAL_TABLET | Freq: Every day | ORAL | 1 refills | Status: DC

## 2016-06-11 NOTE — Addendum Note (Signed)
Addended by: Chevis Pretty on: 06/11/2016 11:14 AM   Modules accepted: Orders

## 2016-06-11 NOTE — Patient Instructions (Signed)

## 2016-06-11 NOTE — Progress Notes (Signed)
Subjective:    Patient ID: [name], female    DOB: 02/07/1950, 66 y.o.   MRN: [id number]  HPI  Maria Moss is here today for follow up of chronic medical problem.  Outpatient Encounter Prescriptions as of 06/11/2016  Medication Sig  . ALPRAZolam (XANAX) 0.25 MG tablet Take 1 tablet (0.25 mg total) by mouth at bedtime.  . Cholecalciferol (VITAMIN D-1000 MAX ST) 1000 units tablet Take by mouth.  . cyclobenzaprine (FLEXERIL) 10 MG tablet Take 1 tablet (10 mg total) by mouth 3 times daily as needed for muscle spams  . enalapril (VASOTEC) 20 MG tablet Take 1.5 tablets (30 mg total) by mouth daily.  . fluticasone (FLONASE) 50 MCG/ACT nasal spray USE 2 SPRAYS IN EACH NOSTRIL EVERY DAY  . hydrochlorothiazide (HYDRODIURIL) 25 MG tablet Take 1 tablet (25 mg total) by mouth daily.  . hyoscyamine (ANASPAZ) 0.125 MG TBDP disintergrating tablet Take 1 tablet (0.125 mg total) by mouth every 6 (six) hours as needed.  . loratadine (CLARITIN) 10 MG tablet Take 10 mg by mouth daily.  . meloxicam (MOBIC) 15 MG tablet Take 15 mg by mouth daily.  . Omega-3 Fatty Acids (FISH OIL) 1000 MG CAPS Take by mouth.  Marland Kitchen omeprazole (PRILOSEC) 40 MG capsule Take 1 capsule (40 mg total) by mouth daily.  . Probiotic Product (PROBIOTIC & ACIDOPHILUS EX ST PO) Take by mouth.  . simvastatin (ZOCOR) 20 MG tablet Take 1 tablet (20 mg total) by mouth at bedtime.  . traMADol (ULTRAM) 50 MG tablet Take 1 tablet (50 mg total) by mouth every 8 (eight) hours as needed.  . traMADol (ULTRAM) 50 MG tablet Take 1 tablet (50 mg total) by mouth 2 (two) times daily.  . traMADol (ULTRAM) 50 MG tablet Take 1 tablet (50 mg total) by mouth 2 (two) times daily.     1. Essential hypertension  No c/o chest pain,sob or ha- does not check blood pressure at home  2. Degenerative disc disease, lumbar  Has chronic back pain- is on tramadol daily which helps  3. Pure hypercholesterolemia Does not watch diet   4. GAD (generalized anxiety disorder)    Still on xanax- knows she needs to wean off xanax in order to stay on pain meds  5. Diverticulosis of large intestine without hemorrhage  No recent flare ups  6. BMI 27.0-27.9,adult  No weight change since last visit  7. Chronic midline low back pain without sciatica  On pain meds as started earlier- pain currently 3/10. Having some right hip pain and had injected 3 weeks ago but did not help. They are going to do mri if does not get better  8. Primary insomnia  No on any sleep aids right now- do not want her to take with pain meds-still has trouble sleeping    New complaints: None today     Review of Systems  Constitutional: Negative.   HENT: Negative.   Respiratory: Negative.   Cardiovascular: Negative.   Gastrointestinal: Negative.   Musculoskeletal: Positive for back pain (chronic).  Neurological: Negative.   Psychiatric/Behavioral: Negative.   All other systems reviewed and are negative.      Objective:   Physical Exam  Constitutional: She is oriented to person, place, and time. She appears well-developed and well-nourished. No distress.  Cardiovascular: Normal rate and regular rhythm.   Pulmonary/Chest: Effort normal and breath sounds normal.  Musculoskeletal:  Limp on right hip when walking- FROM with pain on abduction. Decrease ROM of lumbar  spine due to pain on flexion and extension. (+) Slr on right  Neurological: She is alert and oriented to person, place, and time.  Skin: Skin is warm.  Psychiatric: She has a normal mood and affect. Her behavior is normal. Judgment and thought content normal.    BP 126/85   Pulse (!) 106   Temp 98.5 F (36.9 C) (Oral)   Ht 5\' 1"  (1.549 m)   Wt 150 lb 12.8 oz (68.4 kg)   BMI 28.49 kg/m        Assessment & Plan:  1. Essential hypertension Low sodium diet  2. Degenerative disc disease, lumbar  3. Pure hypercholesterolemia Low fat diet  4. GAD (generalized anxiety disorder) stres management Need to wean off  xanax  5. Diverticulosis of large intestine without hemorrhage  6. BMI 27.0-27.9,adult Discussed diet and exercise for person with BMI >25 Will recheck weight in 3-6 months  7. Chronic midline low back pain without sciatica Keep follow up with Dr. Lorin Mercy - traMADol (ULTRAM) 50 MG tablet; Take 1 tablet (50 mg total) by mouth every 8 (eight) hours as needed.  Dispense: 60 tablet; Refill: 0 - traMADol (ULTRAM) 50 MG tablet; Take 1 tablet (50 mg total) by mouth 2 (two) times daily.  Dispense: 60 tablet; Refill: 0 - traMADol (ULTRAM) 50 MG tablet; Take 1 tablet (50 mg total) by mouth 2 (two) times daily.  Dispense: 60 tablet; Refill: 0  8. Primary insomnia Bedtime routine - ALPRAZolam (XANAX) 0.25 MG tablet; Take 1 tablet (0.25 mg total) by mouth at bedtime.  Dispense: 30 tablet; Refill: 1    Labs pending Health maintenance reviewed Diet and exercise encouraged Continue all meds Follow up  In 3 months   [place], FNP

## 2016-06-12 LAB — LIPID PANEL
CHOLESTEROL TOTAL: 159 mg/dL (ref 100–199)
Chol/HDL Ratio: 2.7 ratio (ref 0.0–4.4)
HDL: 59 mg/dL (ref >39)
LDL Calculated: 65 mg/dL (ref 0–99)
Triglycerides: 177 mg/dL — ABNORMAL HIGH (ref 0–149)
VLDL CHOLESTEROL CAL: 35 mg/dL (ref 5–40)

## 2016-06-12 LAB — CMP14+EGFR
ALBUMIN: 4.3 g/dL (ref 3.6–4.8)
ALK PHOS: 133 IU/L — AB (ref 39–117)
ALT: 27 IU/L (ref 0–32)
AST: 28 IU/L (ref 0–40)
Albumin/Globulin Ratio: 1.8 (ref 1.2–2.2)
BILIRUBIN TOTAL: 0.4 mg/dL (ref 0.0–1.2)
BUN / CREAT RATIO: 19 (ref 12–28)
BUN: 19 mg/dL (ref 8–27)
CHLORIDE: 100 mmol/L (ref 96–106)
CO2: 26 mmol/L (ref 18–29)
Calcium: 10.2 mg/dL (ref 8.7–10.3)
Creatinine, Ser: 1.01 mg/dL — ABNORMAL HIGH (ref 0.57–1.00)
GFR calc Af Amer: 68 mL/min/{1.73_m2} (ref >59)
GFR calc non Af Amer: 59 mL/min/{1.73_m2} — ABNORMAL LOW (ref >59)
GLUCOSE: 97 mg/dL (ref 65–99)
Globulin, Total: 2.4 g/dL (ref 1.5–4.5)
Potassium: 4.8 mmol/L (ref 3.5–5.2)
SODIUM: 140 mmol/L (ref 134–144)
Total Protein: 6.7 g/dL (ref 6.0–8.5)

## 2016-06-24 ENCOUNTER — Ambulatory Visit (INDEPENDENT_AMBULATORY_CARE_PROVIDER_SITE_OTHER): Payer: Medicare HMO | Admitting: Orthopaedic Surgery

## 2016-07-01 ENCOUNTER — Ambulatory Visit (INDEPENDENT_AMBULATORY_CARE_PROVIDER_SITE_OTHER): Payer: Medicare HMO | Admitting: Orthopaedic Surgery

## 2016-07-21 ENCOUNTER — Other Ambulatory Visit: Payer: Self-pay | Admitting: Nurse Practitioner

## 2016-08-03 MED ORDER — MELOXICAM 15 MG PO TABS: 15.0000 mg | ORAL_TABLET | Freq: Every day | ORAL | 0 refills | Status: DC

## 2016-08-03 NOTE — Telephone Encounter (Signed)
done

## 2016-08-16 ENCOUNTER — Other Ambulatory Visit: Payer: Self-pay | Admitting: Nurse Practitioner

## 2016-08-16 DIAGNOSIS — E7800 Pure hypercholesterolemia, unspecified: Secondary | ICD-10-CM

## 2016-08-16 DIAGNOSIS — I10 Essential (primary) hypertension: Secondary | ICD-10-CM

## 2016-08-16 DIAGNOSIS — K219 Gastro-esophageal reflux disease without esophagitis: Secondary | ICD-10-CM

## 2016-09-08 ENCOUNTER — Other Ambulatory Visit: Payer: Self-pay | Admitting: Nurse Practitioner

## 2016-09-08 ENCOUNTER — Telehealth: Payer: Self-pay | Admitting: Nurse Practitioner

## 2016-09-08 DIAGNOSIS — M545 Low back pain, unspecified: Secondary | ICD-10-CM

## 2016-09-08 DIAGNOSIS — G8929 Other chronic pain: Secondary | ICD-10-CM

## 2016-09-09 NOTE — Telephone Encounter (Signed)
patoient has to be seen to get pain medication

## 2016-09-09 NOTE — Telephone Encounter (Signed)
Patient has to be seen to get pain meds

## 2016-09-09 NOTE — Telephone Encounter (Signed)
Patient aware.

## 2016-09-10 ENCOUNTER — Ambulatory Visit (INDEPENDENT_AMBULATORY_CARE_PROVIDER_SITE_OTHER): Payer: Medicare HMO | Admitting: Nurse Practitioner

## 2016-09-10 ENCOUNTER — Encounter: Payer: Self-pay | Admitting: Nurse Practitioner

## 2016-09-10 VITALS — BP 154/98 | HR 100 | Temp 97.1°F | Ht 61.0 in | Wt 142.0 lb

## 2016-09-10 DIAGNOSIS — G8929 Other chronic pain: Secondary | ICD-10-CM

## 2016-09-10 DIAGNOSIS — Z6827 Body mass index (BMI) 27.0-27.9, adult: Secondary | ICD-10-CM

## 2016-09-10 DIAGNOSIS — E7800 Pure hypercholesterolemia, unspecified: Secondary | ICD-10-CM

## 2016-09-10 DIAGNOSIS — R945 Abnormal results of liver function studies: Secondary | ICD-10-CM

## 2016-09-10 DIAGNOSIS — M5136 Other intervertebral disc degeneration, lumbar region: Secondary | ICD-10-CM | POA: Diagnosis not present

## 2016-09-10 DIAGNOSIS — K5730 Diverticulosis of large intestine without perforation or abscess without bleeding: Secondary | ICD-10-CM

## 2016-09-10 DIAGNOSIS — F5101 Primary insomnia: Secondary | ICD-10-CM

## 2016-09-10 DIAGNOSIS — F411 Generalized anxiety disorder: Secondary | ICD-10-CM

## 2016-09-10 DIAGNOSIS — I10 Essential (primary) hypertension: Secondary | ICD-10-CM | POA: Diagnosis not present

## 2016-09-10 DIAGNOSIS — R7989 Other specified abnormal findings of blood chemistry: Secondary | ICD-10-CM | POA: Diagnosis not present

## 2016-09-10 DIAGNOSIS — M51369 Other intervertebral disc degeneration, lumbar region without mention of lumbar back pain or lower extremity pain: Secondary | ICD-10-CM

## 2016-09-10 DIAGNOSIS — Z7189 Other specified counseling: Secondary | ICD-10-CM | POA: Diagnosis not present

## 2016-09-10 DIAGNOSIS — M545 Low back pain: Secondary | ICD-10-CM | POA: Diagnosis not present

## 2016-09-10 LAB — CMP14+EGFR
ALK PHOS: 297 IU/L — AB (ref 39–117)
ALT: 89 IU/L — ABNORMAL HIGH (ref 0–32)
AST: 50 IU/L — AB (ref 0–40)
Albumin/Globulin Ratio: 1.8 (ref 1.2–2.2)
Albumin: 4.8 g/dL (ref 3.6–4.8)
BUN/Creatinine Ratio: 20 (ref 12–28)
BUN: 17 mg/dL (ref 8–27)
Bilirubin Total: 0.4 mg/dL (ref 0.0–1.2)
CO2: 24 mmol/L (ref 20–29)
CREATININE: 0.85 mg/dL (ref 0.57–1.00)
Calcium: 10.6 mg/dL — ABNORMAL HIGH (ref 8.7–10.3)
Chloride: 101 mmol/L (ref 96–106)
GFR calc Af Amer: 84 mL/min/{1.73_m2} (ref >59)
GFR calc non Af Amer: 73 mL/min/{1.73_m2} (ref >59)
GLUCOSE: 104 mg/dL — AB (ref 65–99)
Globulin, Total: 2.6 g/dL (ref 1.5–4.5)
Potassium: 4.9 mmol/L (ref 3.5–5.2)
SODIUM: 142 mmol/L (ref 134–144)
Total Protein: 7.4 g/dL (ref 6.0–8.5)

## 2016-09-10 LAB — LIPID PANEL
CHOLESTEROL TOTAL: 175 mg/dL (ref 100–199)
Chol/HDL Ratio: 2.8 ratio (ref 0.0–4.4)
HDL: 63 mg/dL (ref >39)
LDL CALC: 59 mg/dL (ref 0–99)
TRIGLYCERIDES: 266 mg/dL — AB (ref 0–149)
VLDL CHOLESTEROL CAL: 53 mg/dL — AB (ref 5–40)

## 2016-09-10 MED ORDER — HYOSCYAMINE SULFATE 0.125 MG PO TBDP: 0.1250 mg | ORAL_TABLET | Freq: Four times a day (QID) | ORAL | 2 refills | Status: DC | PRN

## 2016-09-10 MED ORDER — TRAMADOL HCL 50 MG PO TABS: 50.0000 mg | ORAL_TABLET | Freq: Two times a day (BID) | ORAL | 0 refills | Status: DC

## 2016-09-10 MED ORDER — TRAMADOL HCL 50 MG PO TABS: 50.0000 mg | ORAL_TABLET | Freq: Three times a day (TID) | ORAL | 0 refills | Status: DC | PRN

## 2016-09-10 MED ORDER — ALPRAZOLAM 0.25 MG PO TABS: 0.2500 mg | ORAL_TABLET | Freq: Every day | ORAL | 2 refills | Status: DC

## 2016-09-10 NOTE — Telephone Encounter (Signed)
Patient has an appt for today.  

## 2016-09-10 NOTE — Patient Instructions (Signed)
Stress and Stress Management Stress is a normal reaction to life events. It is what you feel when life demands more than you are used to or more than you can handle. Some stress can be useful. For example, the stress reaction can help you catch the last bus of the day, study for a test, or meet a deadline at work. But stress that occurs too often or for too long can cause problems. It can affect your emotional health and interfere with relationships and normal daily activities. Too much stress can weaken your immune system and increase your risk for physical illness. If you already have a medical problem, stress can make it worse. What are the causes? All sorts of life events may cause stress. An event that causes stress for one person may not be stressful for another person. Major life events commonly cause stress. These may be positive or negative. Examples include losing your job, moving into a new home, getting married, having a baby, or losing a loved one. Less obvious life events may also cause stress, especially if they occur day after day or in combination. Examples include working long hours, driving in traffic, caring for children, being in debt, or being in a difficult relationship. What are the signs or symptoms? Stress may cause emotional symptoms including, the following:  Anxiety. This is feeling worried, afraid, on edge, overwhelmed, or out of control.  Anger. This is feeling irritated or impatient.  Depression. This is feeling sad, down, helpless, or guilty.  Difficulty focusing, remembering, or making decisions.  Stress may cause physical symptoms, including the following:  Aches and pains. These may affect your head, neck, back, stomach, or other areas of your body.  Tight muscles or clenched jaw.  Low energy or trouble sleeping.  Stress may cause unhealthy behaviors, including the following:  Eating to feel better (overeating) or skipping meals.  Sleeping too little,  too much, or both.  Working too much or putting off tasks (procrastination).  Smoking, drinking alcohol, or using drugs to feel better.  How is this diagnosed? Stress is diagnosed through an assessment by your health care provider. Your health care provider will ask questions about your symptoms and any stressful life events.Your health care provider will also ask about your medical history and may order blood tests or other tests. Certain medical conditions and medicine can cause physical symptoms similar to stress. Mental illness can cause emotional symptoms and unhealthy behaviors similar to stress. Your health care provider may refer you to a mental health professional for further evaluation. How is this treated? Stress management is the recommended treatment for stress.The goals of stress management are reducing stressful life events and coping with stress in healthy ways. Techniques for reducing stressful life events include the following:  Stress identification. Self-monitor for stress and identify what causes stress for you. These skills may help you to avoid some stressful events.  Time management. Set your priorities, keep a calendar of events, and learn to say "no." These tools can help you avoid making too many commitments.  Techniques for coping with stress include the following:  Rethinking the problem. Try to think realistically about stressful events rather than ignoring them or overreacting. Try to find the positives in a stressful situation rather than focusing on the negatives.  Exercise. Physical exercise can release both physical and emotional tension. The key is to find a form of exercise you enjoy and do it regularly.  Relaxation techniques. These relax the body and  mind. Examples include yoga, meditation, tai chi, biofeedback, deep breathing, progressive muscle relaxation, listening to music, being out in nature, journaling, and other hobbies. Again, the key is to find  one or more that you enjoy and can do regularly.  Healthy lifestyle. Eat a balanced diet, get plenty of sleep, and do not smoke. Avoid using alcohol or drugs to relax.  Strong support network. Spend time with family, friends, or other people you enjoy being around.Express your feelings and talk things over with someone you trust.  Counseling or talktherapy with a mental health professional may be helpful if you are having difficulty managing stress on your own. Medicine is typically not recommended for the treatment of stress.Talk to your health care provider if you think you need medicine for symptoms of stress. Follow these instructions at home:  Keep all follow-up visits as directed by your health care provider.  Take all medicines as directed by your health care provider. Contact a health care provider if:  Your symptoms get worse or you start having new symptoms.  You feel overwhelmed by your problems and can no longer manage them on your own. Get help right away if:  You feel like hurting yourself or someone else. This information is not intended to replace advice given to you by your health care provider. Make sure you discuss any questions you have with your health care provider. Document Released: 06/16/2000 Document Revised: 05/29/2015 Document Reviewed: 08/15/2012 Elsevier Interactive Patient Education  2017 [employer]  

## 2016-09-10 NOTE — Progress Notes (Addendum)
Subjective:    Patient ID: Maria Moss, female    DOB: May 22, 1950, 66 y.o.   MRN: [id number]  HPI  SHAVANNA FURNARI is here today for follow up of chronic medical problem.  Outpatient Encounter Prescriptions as of 09/10/2016  Medication Sig  . ALPRAZolam (XANAX) 0.25 MG tablet Take 1 tablet (0.25 mg total) by mouth at bedtime.  . Cholecalciferol (VITAMIN D-1000 MAX ST) 1000 units tablet Take by mouth.  . cyclobenzaprine (FLEXERIL) 10 MG tablet Take 1 tablet (10 mg total) by mouth 3 times daily as needed for muscle spams  . enalapril (VASOTEC) 20 MG tablet TAKE 1 AND 1/2 TABLETS (30MG TOTAL) EVERY DAY  . fluticasone (FLONASE) 50 MCG/ACT nasal spray USE 2 SPRAYS IN EACH NOSTRIL EVERY DAY  . hydrochlorothiazide (HYDRODIURIL) 25 MG tablet TAKE 1 TABLET DAILY.  . hyoscyamine (ANASPAZ) 0.125 MG TBDP disintergrating tablet Take 1 tablet (0.125 mg total) by mouth every 6 (six) hours as needed.  . loratadine (CLARITIN) 10 MG tablet Take 10 mg by mouth daily.  . meloxicam (MOBIC) 15 MG tablet Take 1 tablet (15 mg total) by mouth daily.  . Omega-3 Fatty Acids (FISH OIL) 1000 MG CAPS Take by mouth.  Marland Kitchen omeprazole (PRILOSEC) 40 MG capsule TAKE 1 CAPSULE (40 MG TOTAL) BY MOUTH DAILY.  . Probiotic Product (PROBIOTIC & ACIDOPHILUS EX ST PO) Take by mouth.  . simvastatin (ZOCOR) 20 MG tablet TAKE 1 TABLET AT BEDTIME.  . traMADol (ULTRAM) 50 MG tablet Take 1 tablet (50 mg total) by mouth every 8 (eight) hours as needed.  . traMADol (ULTRAM) 50 MG tablet Take 1 tablet (50 mg total) by mouth 2 (two) times daily.  . traMADol (ULTRAM) 50 MG tablet Take 1 tablet (50 mg total) by mouth 2 (two) times daily.    1. Essential hypertension  No c/o chest pain, sob or headache. She does ot check blood pressures at home  2. Degenerative disc disease, lumbar  Rates pain today 5/10. Never has a time when she os not in pain. NCCsub checked and there was no suspicious activity.  3. Pure hypercholesterolemia  Does not watch diet    4. GAD (generalized anxiety disorder)  Do better with stress- we have decreased her to xanax 0.25 BID- she is trying to wean off but has not been able to do so.  5. Encounter for pain management counseling  Needs pain meds filled today- discussed xanax with her again and need to come off of it.  6. Diverticulosis of large intestine without hemorrhage   no recent flare up- tries to control with diet  7. BMI 27.0-27.9,adult  No recent weight changes  8. Chronic midline low back pain without sciatica  Discuss previously  9. Primary insomnia  Is currently not taking anything    New complaints:  none  Social history: Husband had tooth pulled that lead to infection and he ended up on ventilator in ICU and almost died- still has feeding tube and she is having to take care of him.    Review of Systems  Constitutional: Negative for activity change and appetite change.  HENT: Negative.   Eyes: Negative for pain.  Respiratory: Negative for shortness of breath.   Cardiovascular: Negative for chest pain, palpitations and leg swelling.  Gastrointestinal: Negative for abdominal pain.  Endocrine: Negative for polydipsia.  Genitourinary: Negative.   Musculoskeletal: Positive for back pain.  Skin: Negative for rash.  Neurological: Negative for dizziness, weakness and headaches.  Hematological: Does  not bruise/bleed easily.  Psychiatric/Behavioral: Negative.   All other systems reviewed and are negative.      Objective:   Physical Exam  Constitutional: She is oriented to person, place, and time. She appears well-developed and well-nourished.  HENT:  Nose: Nose normal.  Mouth/Throat: Oropharynx is clear and moist.  Eyes: EOM are normal.  Neck: Trachea normal, normal range of motion and full passive range of motion without pain. Neck supple. No JVD present. Carotid bruit is not present. No thyromegaly present.  Cardiovascular: Normal rate, regular rhythm and intact distal pulses.  Exam  reveals no gallop and no friction rub.   Murmur (2/6 systolis murmur) heard. Pulmonary/Chest: Effort normal and breath sounds normal.  Abdominal: Soft. Bowel sounds are normal. She exhibits no distension and no mass. There is no tenderness.  Musculoskeletal: Normal range of motion.  Gait is hunched over with limb on right  Lymphadenopathy:    She has no cervical adenopathy.  Neurological: She is alert and oriented to person, place, and time. She has normal reflexes.  Skin: Skin is warm and dry.  Psychiatric: She has a normal mood and affect. Her behavior is normal. Judgment and thought content normal.   BP (!) 154/98   Pulse 100   Temp (!) 97.1 F (36.2 C) (Oral)   Ht _0  (1.549 m)   Wt 142 lb (64.4 kg)   BMI 26.83 kg/m         Assessment & Plan:  1. Essential hypertension Low sodium diet - CMP14+EGFR  2. Degenerative disc disease, lumbar Rest No heavy lifting  3. Pure hypercholesterolemia Low fat diet - Lipid panel  4. GAD (generalized anxiety disorder) Stress management Continue to try to wean off xanax  5. Encounter for pain management counseling  6. Diverticulosis of large intestine without hemorrhage Continue to watch diet which will cause flare ups - hyoscyamine (ANASPAZ) 0.125 MG TBDP disintergrating tablet; Take 1 tablet (0.125 mg total) by mouth every 6 (six) hours as needed.  Dispense: 30 tablet; Refill: 2  7. BMI 27.0-27.9,adult Discussed diet and exercise for person with BMI >25 Will recheck weight in 3-6 months  8. Chronic midline low back pain without sciatica - traMADol (ULTRAM) 50 MG tablet; Take 1 tablet (50 mg total) by mouth every 8 (eight) hours as needed.  Dispense: 60 tablet; Refill: 0 - traMADol (ULTRAM) 50 MG tablet; Take 1 tablet (50 mg total) by mouth 2 (two) times daily.  Dispense: 60 tablet; Refill: 0 - traMADol (ULTRAM) 50 MG tablet; Take 1 tablet (50 mg total) by mouth 2 (two) times daily.  Dispense: 60 tablet; Refill: 0  9.  Primary insomnia Bedtime routine - ALPRAZolam (XANAX) 0.25 MG tablet; Take 1 tablet (0.25 mg total) by mouth at bedtime.  Dispense: 30 tablet; Refill: 2    Labs pending Health maintenance reviewed Diet and exercise encouraged Continue all meds Follow up  In 3 months   Brad[place], FNP

## 2016-09-13 NOTE — Addendum Note (Signed)
Addended by: Thana Ates on: 09/13/2016 04:02 PM   Modules accepted: Orders

## 2016-09-14 ENCOUNTER — Ambulatory Visit: Payer: Medicare HMO | Admitting: Nurse Practitioner

## 2016-09-24 ENCOUNTER — Other Ambulatory Visit: Payer: Medicare HMO

## 2016-09-24 DIAGNOSIS — R7989 Other specified abnormal findings of blood chemistry: Secondary | ICD-10-CM | POA: Diagnosis not present

## 2016-09-24 LAB — CMP14+EGFR
ALT: 32 IU/L (ref 0–32)
AST: 28 IU/L (ref 0–40)
Albumin/Globulin Ratio: 2.3 — ABNORMAL HIGH (ref 1.2–2.2)
Albumin: 4.5 g/dL (ref 3.6–4.8)
Alkaline Phosphatase: 158 IU/L — ABNORMAL HIGH (ref 39–117)
BUN/Creatinine Ratio: 31 — ABNORMAL HIGH (ref 12–28)
BUN: 25 mg/dL (ref 8–27)
Bilirubin Total: 0.6 mg/dL (ref 0.0–1.2)
CALCIUM: 10.5 mg/dL — AB (ref 8.7–10.3)
CO2: 27 mmol/L (ref 20–29)
CREATININE: 0.8 mg/dL (ref 0.57–1.00)
Chloride: 99 mmol/L (ref 96–106)
GFR, EST AFRICAN AMERICAN: 90 mL/min/{1.73_m2} (ref >59)
GFR, EST NON AFRICAN AMERICAN: 78 mL/min/{1.73_m2} (ref >59)
Globulin, Total: 2 g/dL (ref 1.5–4.5)
Glucose: 102 mg/dL — ABNORMAL HIGH (ref 65–99)
Potassium: 4.2 mmol/L (ref 3.5–5.2)
Sodium: 140 mmol/L (ref 134–144)
TOTAL PROTEIN: 6.5 g/dL (ref 6.0–8.5)

## 2016-10-05 ENCOUNTER — Other Ambulatory Visit: Payer: Self-pay | Admitting: Nurse Practitioner

## 2016-10-08 ENCOUNTER — Telehealth: Payer: Self-pay

## 2016-10-08 NOTE — Telephone Encounter (Signed)
Ms. Devall's husband, Octivia Canion, is one of your patient's.  She would like to know if you would consent to be her PCP.  She has been taking Tramadol prescribed by [facility] and states she has signed a pain contract with her.  However, she said she would be willing to try something else such as anti-inflammatories if you would agree.  Please advise.

## 2016-10-11 NOTE — Telephone Encounter (Signed)
Yes okay to take her on as a patient.

## 2016-10-12 NOTE — Telephone Encounter (Signed)
Patient aware, changed PCP to Dr. Warrick Parisian, 3 month follow up appointment made for 12/10/16.

## 2016-11-11 ENCOUNTER — Ambulatory Visit (INDEPENDENT_AMBULATORY_CARE_PROVIDER_SITE_OTHER): Payer: Medicare HMO

## 2016-11-11 DIAGNOSIS — Z23 Encounter for immunization: Secondary | ICD-10-CM | POA: Diagnosis not present

## 2016-11-15 ENCOUNTER — Encounter: Payer: Self-pay | Admitting: *Deleted

## 2016-12-03 ENCOUNTER — Ambulatory Visit: Payer: Medicare HMO | Admitting: Nurse Practitioner

## 2016-12-06 ENCOUNTER — Encounter: Payer: Self-pay | Admitting: Family Medicine

## 2016-12-06 ENCOUNTER — Ambulatory Visit: Payer: Medicare HMO | Admitting: Family Medicine

## 2016-12-06 VITALS — BP 139/87 | HR 76 | Temp 98.0°F | Ht 61.0 in | Wt 143.0 lb

## 2016-12-06 DIAGNOSIS — R109 Unspecified abdominal pain: Secondary | ICD-10-CM | POA: Diagnosis not present

## 2016-12-06 DIAGNOSIS — K529 Noninfective gastroenteritis and colitis, unspecified: Secondary | ICD-10-CM | POA: Diagnosis not present

## 2016-12-06 LAB — MICROSCOPIC EXAMINATION
EPITHELIAL CELLS (NON RENAL): NONE SEEN /HPF (ref 0–10)
RENAL EPITHEL UA: NONE SEEN /HPF

## 2016-12-06 LAB — URINALYSIS, COMPLETE
BILIRUBIN UA: NEGATIVE
Glucose, UA: NEGATIVE
Ketones, UA: NEGATIVE
LEUKOCYTES UA: NEGATIVE
Nitrite, UA: NEGATIVE
PROTEIN UA: NEGATIVE
Specific Gravity, UA: 1.015 (ref 1.005–1.030)
Urobilinogen, Ur: 0.2 mg/dL (ref 0.2–1.0)
pH, UA: 7.5 (ref 5.0–7.5)

## 2016-12-06 MED ORDER — METRONIDAZOLE 500 MG PO TABS: 500.0000 mg | ORAL_TABLET | Freq: Three times a day (TID) | ORAL | 0 refills | Status: DC

## 2016-12-06 MED ORDER — CIPROFLOXACIN HCL 500 MG PO TABS: 500.0000 mg | ORAL_TABLET | Freq: Two times a day (BID) | ORAL | 0 refills | Status: DC

## 2016-12-06 NOTE — Progress Notes (Signed)
BP 139/87   Pulse 76   Temp 98 F (36.7 C) (Oral)   Ht 5\' 1"  (1.549 m)   Wt 143 lb (64.9 kg)   BMI 27.02 kg/m    Subjective:    Patient ID: Maria Moss, female    DOB: Jul 23, 1950, 66 y.o.   MRN: [id number]  HPI: Maria Moss is a 66 y.o. female presenting on 12/06/2016 for Nausea, abdominal pain, flank pain   HPI Diarrhea and lower abdominal pain and flank pain Patient complains of increased diarrhea and lower abdominal pain that is been going on over the past week.  She says she even had to take an Imodium.  She then has developed this left flank pain along with her lower abdominal pain and thought that she may be having urinary problems and want to get that checked out.  She denies any fevers or chills.  She denies any dysuria or frequency.  Since taking the Imodium her stools have started to slow down in frequency.  She denies any blood in her stool.  She is developed nausea over the past day which is new.  She denies any vomiting.  Relevant past medical, surgical, family and social history reviewed and updated as indicated. Interim medical history since our last visit reviewed. Allergies and medications reviewed and updated.  Review of Systems  Constitutional: Negative for chills and fever.  Eyes: Negative for visual disturbance.  Respiratory: Negative for chest tightness and shortness of breath.   Cardiovascular: Negative for chest pain and leg swelling.  Gastrointestinal: Positive for abdominal pain, diarrhea and nausea. Negative for blood in stool, constipation and vomiting.  Genitourinary: Positive for flank pain. Negative for decreased urine volume, difficulty urinating, dysuria, frequency, hematuria, urgency, vaginal bleeding, vaginal discharge and vaginal pain.  Skin: Negative for rash.  Neurological: Negative for light-headedness and headaches.  Psychiatric/Behavioral: Negative for agitation and behavioral problems.  All other systems reviewed and are negative.   Per HPI  unless specifically indicated above        Objective:    BP 139/87   Pulse 76   Temp 98 F (36.7 C) (Oral)   Ht 5\' 1"  (1.549 m)   Wt 143 lb (64.9 kg)   BMI 27.02 kg/m   Wt Readings from Last 3 Encounters:  12/06/16 143 lb (64.9 kg)  09/10/16 142 lb (64.4 kg)  06/11/16 150 lb 12.8 oz (68.4 kg)    Physical Exam  Constitutional: She is oriented to person, place, and time. She appears well-developed and well-nourished. No distress.  Eyes: Conjunctivae are normal.  Cardiovascular: Normal rate, regular rhythm, normal heart sounds and intact distal pulses.  No murmur heard. Pulmonary/Chest: Effort normal and breath sounds normal. No respiratory distress. She has no wheezes.  Abdominal: Soft. Bowel sounds are normal. She exhibits no distension and no mass. There is tenderness in the right lower quadrant, suprapubic area and left lower quadrant. There is no rigidity, no rebound and no guarding.  Musculoskeletal: Normal range of motion. She exhibits no edema or tenderness.  Neurological: She is alert and oriented to person, place, and time. Coordination normal.  Skin: Skin is warm and dry. No rash noted. She is not diaphoretic.  Psychiatric: She has a normal mood and affect. Her behavior is normal.  Nursing note and vitals reviewed.   Urinalysis: 0-5 WBCs, 0-2 RBCs, few bacteria, trace blood    Assessment & Plan:   Problem List Items Addressed This Visit    None  Visit Diagnoses    Colitis, acute    -  Primary   Relevant Medications   ciprofloxacin (CIPRO) 500 MG tablet   metroNIDAZOLE (FLAGYL) 500 MG tablet   Other Relevant Orders   Urinalysis, Complete       Follow up plan: Return in about 1 week (around 12/13/2016), or if symptoms worsen or fail to improve, for Routine follow-up.  Counseling provided for all of the vaccine components Orders Placed This Encounter  Procedures  . Urinalysis, Complete    Caryl Pina, MD [facility] 12/06/2016, 12:06 PM

## 2016-12-07 ENCOUNTER — Ambulatory Visit: Payer: Medicare HMO | Admitting: Family Medicine

## 2016-12-08 ENCOUNTER — Other Ambulatory Visit: Payer: Self-pay | Admitting: Nurse Practitioner

## 2016-12-08 DIAGNOSIS — K5730 Diverticulosis of large intestine without perforation or abscess without bleeding: Secondary | ICD-10-CM

## 2016-12-10 ENCOUNTER — Ambulatory Visit: Payer: Medicare HMO | Admitting: Family Medicine

## 2016-12-14 ENCOUNTER — Ambulatory Visit: Payer: Medicare HMO | Admitting: Family Medicine

## 2016-12-16 ENCOUNTER — Encounter: Payer: Self-pay | Admitting: Family Medicine

## 2016-12-16 ENCOUNTER — Telehealth: Payer: Self-pay | Admitting: *Deleted

## 2016-12-16 ENCOUNTER — Ambulatory Visit: Payer: Medicare HMO | Admitting: Family Medicine

## 2016-12-16 VITALS — BP 184/93 | HR 81 | Temp 98.7°F | Ht 61.0 in | Wt 144.0 lb

## 2016-12-16 DIAGNOSIS — B370 Candidal stomatitis: Secondary | ICD-10-CM

## 2016-12-16 DIAGNOSIS — G8929 Other chronic pain: Secondary | ICD-10-CM

## 2016-12-16 DIAGNOSIS — M545 Low back pain: Secondary | ICD-10-CM

## 2016-12-16 DIAGNOSIS — E7800 Pure hypercholesterolemia, unspecified: Secondary | ICD-10-CM

## 2016-12-16 DIAGNOSIS — I10 Essential (primary) hypertension: Secondary | ICD-10-CM | POA: Diagnosis not present

## 2016-12-16 MED ORDER — NYSTATIN 100000 UNIT/ML MT SUSP: 5.0000 mL | Freq: Four times a day (QID) | OROMUCOSAL | 0 refills | Status: DC

## 2016-12-16 MED ORDER — AMLODIPINE BESYLATE 5 MG PO TABS: 5.0000 mg | ORAL_TABLET | Freq: Every day | ORAL | 3 refills | Status: DC

## 2016-12-16 MED ORDER — TRAMADOL HCL 50 MG PO TABS: 50.0000 mg | ORAL_TABLET | Freq: Two times a day (BID) | ORAL | 0 refills | Status: DC

## 2016-12-16 NOTE — Telephone Encounter (Signed)
Pt's amlodipine went to mail order, she needed enough till mail order gets there, verbal given for #14

## 2016-12-16 NOTE — Progress Notes (Addendum)
BP (!) 184/93 (BP Location: Left Arm, Cuff Size: Large)   Pulse 81   Temp 98.7 F (37.1 C) (Oral)   Ht _0  (1.549 m)   Wt 144 lb (65.3 kg)   BMI 27.21 kg/m    Subjective:    Patient ID: Maria Moss, female    DOB: 06-Jun-1950, 66 y.o.   MRN: [id number]  HPI: Maria Moss is a 66 y.o. female presenting on 12/16/2016 for Hyperlipidemia (follow up; patient is fasting); Hypertension; Anxiety; and Unusual taste on tongue   HPI Hyperlipidemia Patient is coming in for recheck of his hyperlipidemia. The patient is currently taking no medication because her liver function was up on a previous time, we will recheck it today and see about possibly restarting.  We will also recheck her cholesterol. They deny any issues with myalgias or history of liver damage from it. They deny any focal numbness or weakness or chest pain.   Hypertension Patient is currently on enalapril and hydrochlorothiazide, and their blood pressure today is 184/93. Patient denies any lightheadedness or dizziness. Patient denies headaches, blurred vision, chest pains, shortness of breath, or weakness. Denies any side effects from medication and is content with current medication.   Chronic low back pain Patient comes in complaining of chronic low back pain and used to be on tramadol and she is wondering if she can go back on it.  I said we can start it back up but she would need to redo her pain contract.  She says she is the tramadol intermittently and it worked well for up to twice a day.  She denies any numbness or weakness down either of her legs.   Patient says she has oral thrush and a funny taste in her mouth since being on antibiotics.  She has a thin film on her tongue but denies any soreness going down her throat  Relevant past medical, surgical, family and social history reviewed and updated as indicated. Interim medical history since our last visit reviewed. Allergies and medications reviewed and updated.  Review  of Systems  Constitutional: Negative for chills and fever.  HENT: Positive for mouth sores.   Eyes: Negative for visual disturbance.  Respiratory: Negative for chest tightness and shortness of breath.   Cardiovascular: Negative for chest pain and leg swelling.  Gastrointestinal: Positive for diarrhea (Still has residual diarrhea but is still on treatment for diverticulitis).  Musculoskeletal: Positive for back pain. Negative for gait problem.  Skin: Negative for rash.  Neurological: Negative for dizziness, light-headedness and headaches.  Psychiatric/Behavioral: Negative for agitation and behavioral problems.  All other systems reviewed and are negative.   Per HPI unless specifically indicated above        Objective:    BP (!) 184/97   Pulse 81   Temp 98.7 F (37.1 C) (Oral)   Ht _1  (1.549 m)   Wt 144 lb (65.3 kg)   BMI 27.21 kg/m   Wt Readings from Last 3 Encounters:  12/16/16 144 lb (65.3 kg)  12/06/16 143 lb (64.9 kg)  09/10/16 142 lb (64.4 kg)    Physical Exam  Constitutional: She is oriented to person, place, and time. She appears well-developed and well-nourished. No distress.  HENT:  Thin white film on tongue consistent with thrush  Eyes: Conjunctivae are normal.  Neck: Neck supple. No thyromegaly present.  Cardiovascular: Normal rate, regular rhythm, normal heart sounds and intact distal pulses.  No murmur heard. Pulmonary/Chest: Effort normal and breath  sounds normal. No respiratory distress. She has no wheezes. She has no rales.  Abdominal: Soft. Bowel sounds are normal. She exhibits no distension. There is no tenderness. There is no rebound.  Musculoskeletal: Normal range of motion. She exhibits edema (1+ edema left lower extremity where she has had previous repair of the fracture).  Lymphadenopathy:    She has no cervical adenopathy.  Neurological: She is alert and oriented to person, place, and time. Coordination normal.  Skin: Skin is warm and dry. No  rash noted. She is not diaphoretic.  Psychiatric: She has a normal mood and affect. Her behavior is normal.  Nursing note and vitals reviewed.       Assessment & Plan:   Problem List Items Addressed This Visit      Cardiovascular and Mediastinum   Hypertension - Primary (Chronic)   Relevant Medications   amLODipine (NORVASC) 5 MG tablet   Other Relevant Orders   BMP8+EGFR     Other   Hyperlipemia (Chronic)   Relevant Medications   amLODipine (NORVASC) 5 MG tablet   Other Relevant Orders   Lipid panel   Chronic low back pain   Relevant Medications   traMADol (ULTRAM) 50 MG tablet    Other Visit Diagnoses    Thrush, oral       Relevant Medications   nystatin (MYCOSTATIN) 100000 UNIT/ML suspension       Follow up plan: Return in about 4 weeks (around 01/13/2017), or if symptoms worsen or fail to improve, for Pain management contract.  Counseling provided for all of the vaccine components Orders Placed This Encounter  Procedures  . BMP8+EGFR  . Lipid panel    Caryl Pina, MD [facility] 12/16/2016, 11:17 AM

## 2016-12-16 NOTE — Addendum Note (Signed)
Addended by: Caryl Pina on: 12/16/2016 11:33 AM   Modules accepted: Orders

## 2016-12-17 LAB — LIPID PANEL
CHOLESTEROL TOTAL: 159 mg/dL (ref 100–199)
Chol/HDL Ratio: 2.6 ratio (ref 0.0–4.4)
HDL: 61 mg/dL (ref >39)
LDL Calculated: 68 mg/dL (ref 0–99)
Triglycerides: 149 mg/dL (ref 0–149)
VLDL CHOLESTEROL CAL: 30 mg/dL (ref 5–40)

## 2016-12-17 LAB — BMP8+EGFR
BUN / CREAT RATIO: 19 (ref 12–28)
BUN: 13 mg/dL (ref 8–27)
CALCIUM: 10.4 mg/dL — AB (ref 8.7–10.3)
CHLORIDE: 102 mmol/L (ref 96–106)
CO2: 27 mmol/L (ref 20–29)
Creatinine, Ser: 0.7 mg/dL (ref 0.57–1.00)
GFR calc Af Amer: 106 mL/min/{1.73_m2} (ref >59)
GFR calc non Af Amer: 92 mL/min/{1.73_m2} (ref >59)
GLUCOSE: 96 mg/dL (ref 65–99)
POTASSIUM: 4.1 mmol/L (ref 3.5–5.2)
Sodium: 145 mmol/L — ABNORMAL HIGH (ref 134–144)

## 2016-12-30 ENCOUNTER — Other Ambulatory Visit: Payer: Self-pay | Admitting: *Deleted

## 2016-12-30 DIAGNOSIS — K219 Gastro-esophageal reflux disease without esophagitis: Secondary | ICD-10-CM

## 2016-12-30 DIAGNOSIS — I10 Essential (primary) hypertension: Secondary | ICD-10-CM

## 2016-12-30 MED ORDER — AMLODIPINE BESYLATE 5 MG PO TABS: 5.0000 mg | ORAL_TABLET | Freq: Every day | ORAL | 1 refills | Status: DC

## 2016-12-30 MED ORDER — OMEPRAZOLE 40 MG PO CPDR: 40.0000 mg | DELAYED_RELEASE_CAPSULE | Freq: Every day | ORAL | 1 refills | Status: DC

## 2016-12-30 MED ORDER — ENALAPRIL MALEATE 20 MG PO TABS: ORAL_TABLET | ORAL | 1 refills | Status: DC

## 2016-12-30 MED ORDER — MELOXICAM 15 MG PO TABS: 15.0000 mg | ORAL_TABLET | Freq: Every day | ORAL | 1 refills | Status: DC

## 2016-12-30 MED ORDER — HYDROCHLOROTHIAZIDE 25 MG PO TABS: 25.0000 mg | ORAL_TABLET | Freq: Every day | ORAL | 1 refills | Status: DC

## 2016-12-31 ENCOUNTER — Other Ambulatory Visit: Payer: Self-pay

## 2016-12-31 DIAGNOSIS — F5101 Primary insomnia: Secondary | ICD-10-CM

## 2016-12-31 MED ORDER — ALPRAZOLAM 0.25 MG PO TABS: 0.2500 mg | ORAL_TABLET | Freq: Every day | ORAL | 2 refills | Status: DC

## 2016-12-31 NOTE — Telephone Encounter (Signed)
Last seen 12/16/16  Dr Dettinger

## 2017-01-03 ENCOUNTER — Other Ambulatory Visit: Payer: Self-pay | Admitting: Nurse Practitioner

## 2017-01-03 DIAGNOSIS — I10 Essential (primary) hypertension: Secondary | ICD-10-CM

## 2017-01-03 DIAGNOSIS — K219 Gastro-esophageal reflux disease without esophagitis: Secondary | ICD-10-CM

## 2017-01-03 DIAGNOSIS — E7800 Pure hypercholesterolemia, unspecified: Secondary | ICD-10-CM

## 2017-01-05 MED ORDER — MELOXICAM 15 MG PO TABS: 15.0000 mg | ORAL_TABLET | Freq: Every day | ORAL | 1 refills | Status: DC

## 2017-01-05 MED ORDER — AMLODIPINE BESYLATE 5 MG PO TABS: 5.0000 mg | ORAL_TABLET | Freq: Every day | ORAL | 1 refills | Status: DC

## 2017-01-06 ENCOUNTER — Other Ambulatory Visit: Payer: Self-pay | Admitting: Family Medicine

## 2017-01-06 NOTE — Telephone Encounter (Signed)
All medications were refilled yesterday This may have been delayed d/t the holidays and our closure dates

## 2017-01-13 ENCOUNTER — Ambulatory Visit (INDEPENDENT_AMBULATORY_CARE_PROVIDER_SITE_OTHER): Payer: Medicare HMO | Admitting: Family Medicine

## 2017-01-13 ENCOUNTER — Encounter: Payer: Self-pay | Admitting: Family Medicine

## 2017-01-13 VITALS — BP 138/89 | HR 85 | Temp 96.3°F | Ht 61.0 in | Wt 147.0 lb

## 2017-01-13 DIAGNOSIS — M545 Low back pain: Secondary | ICD-10-CM

## 2017-01-13 DIAGNOSIS — G8929 Other chronic pain: Secondary | ICD-10-CM | POA: Diagnosis not present

## 2017-01-13 DIAGNOSIS — Z0289 Encounter for other administrative examinations: Secondary | ICD-10-CM

## 2017-01-13 MED ORDER — TRAMADOL HCL 50 MG PO TABS
50.0000 mg | ORAL_TABLET | Freq: Three times a day (TID) | ORAL | 0 refills | Status: DC | PRN
Start: 2017-01-13 — End: 2017-04-14

## 2017-01-13 MED ORDER — TRAMADOL HCL 50 MG PO TABS: 50.0000 mg | ORAL_TABLET | Freq: Three times a day (TID) | ORAL | 0 refills | Status: DC | PRN

## 2017-01-13 NOTE — Addendum Note (Signed)
Addended by: Michaela Corner on: 01/13/2017 01:47 PM   Modules accepted: Orders

## 2017-01-13 NOTE — Progress Notes (Signed)
[place] Controll0ed Substance Abuse database reviewed- Yes If yes- were their any concerning findings : no Depression screen [facility] 2/9 01/13/2017 12/16/2016 12/06/2016 09/10/2016 06/11/2016  Decreased Interest 0 0 0 1 0  Down, Depressed, Hopeless 1 1 1 1  0  PHQ - 2 Score 1 1 1 2  0  Altered sleeping - - - 0 -  Tired, decreased energy - - - 0 -  Change in appetite - - - 0 -  Feeling bad or failure about yourself  - - - 0 -  Trouble concentrating - - - 0 -  Moving slowly or fidgety/restless - - - 0 -  Suicidal thoughts - - - 0 -  PHQ-9 Score - - - 2 -    GAD 7 : Generalized Anxiety Score 04/11/2015  Nervous, Anxious, on Edge 1  Control/stop worrying 0  Worry too much - different things 0  Trouble relaxing 1  Restless 0  Easily annoyed or irritable 0  Afraid - awful might happen 0  Total GAD 7 Score 2  Anxiety Difficulty Not difficult at all       [place] drug screen performed- Yes  SOAPP  0= never  1= seldom  2=sometimes  3= often  4= very often  How often do you have mood swings? 3 How often do you smoke a cigarette within an hour after waling up? 0 How often have you taken medication other than the way that it was prescribed?0 How often have you used illegal drugs in the past 5 years? 0 How often, in your lifetime, have you had legal problems or been arrested? 0  Score 3  Alcohol Audit - How often during the last year have found that you: 0-Never   1- Less than monthly   2- Monthly     3-Weekly     4-daily or almost daily  - found that you were not able to stop drinking once you started- 0 -failed to do what was normally expected of you because of drinking- 0 -needed a first drink in the morning- 0 -had a feeling of guilt or remorse after drinking- 0 -are/were unable to remember what happened the night before because of your drinking- 0  0- NO   2- yes but not in last year  4- yes during last year -Have you or someone else been injured because of your drinking-  0 - Has anyone been concerned about your drinking or suggested you cut down- 0        TOTAL- 0  ( 0-7- alcohol education, 8-15- simple advice, 16-19 simple advice plus counseling, 20-40 referral for evaluation and treatment 0   [facility]ated Pharm[facility] [place]  Pain assessment: Cause of pain- back Pain location- low back on left side Pain on scale of 1-10- 7 Frequency- daily What increases pain-activity or on feet What makes pain Better-medication, heating pads, hot tub Effects on ADL - sleep and being able to do some physical activities  Prior treatments tried and failed- pt, nsaids Current treatments- tramadol Morphine mg equivalent- 15  Pain management agreement reviewed and signed- Yes

## 2017-01-14 ENCOUNTER — Other Ambulatory Visit: Payer: Self-pay | Admitting: *Deleted

## 2017-01-14 DIAGNOSIS — F5101 Primary insomnia: Secondary | ICD-10-CM

## 2017-01-17 MED ORDER — ALPRAZOLAM 0.25 MG PO TABS: 0.2500 mg | ORAL_TABLET | Freq: Every day | ORAL | 2 refills | Status: DC

## 2017-01-19 LAB — TOXASSURE SELECT 13 (MW), URINE

## 2017-02-08 ENCOUNTER — Ambulatory Visit (INDEPENDENT_AMBULATORY_CARE_PROVIDER_SITE_OTHER): Payer: Medicare HMO | Admitting: Family Medicine

## 2017-02-08 ENCOUNTER — Encounter: Payer: Self-pay | Admitting: Family Medicine

## 2017-02-08 VITALS — BP 133/87 | HR 118 | Temp 97.5°F | Ht 61.0 in | Wt 145.1 lb

## 2017-02-08 DIAGNOSIS — J111 Influenza due to unidentified influenza virus with other respiratory manifestations: Secondary | ICD-10-CM

## 2017-02-08 DIAGNOSIS — R52 Pain, unspecified: Secondary | ICD-10-CM | POA: Diagnosis not present

## 2017-02-08 LAB — VERITOR FLU A/B WAIVED
Influenza A: POSITIVE — AB
Influenza B: NEGATIVE

## 2017-02-08 MED ORDER — OSELTAMIVIR PHOSPHATE 75 MG PO CAPS: 75.0000 mg | ORAL_CAPSULE | Freq: Two times a day (BID) | ORAL | 0 refills | Status: DC

## 2017-02-08 NOTE — Progress Notes (Signed)
Chief Complaint  Patient presents with  . Generalized Body Aches    pt here today c/o body aches, temp of 100.4, cough, sore throat and chills. Pt's grandson was diagnosed with the flu yesterday and she cares for him.    HPI  Patient presents today for patient presents with dry cough runny stuffy nose. Diffuse headache of moderate intensity. Patient also has chills and subjective fever. Body aches worst in the back but present in the legs, shoulders, and torso as well. Has sapped the energy. Onset 2 days ago.   PMH: Smoking status noted ROS: Per HPI  Objective: BP 133/87   Pulse (!) 118   Temp (!) 97.5 F (36.4 C) (Oral)   Ht 5\' 1"  (1.549 m)   Wt 145 lb 2 oz (65.8 kg)   BMI 27.42 kg/m  Gen: NAD, alert, cooperative with exam HEENT: NCAT, EOMI, PERRL CV: RRR, good S1/S2, no murmur Resp: CTABL, no wheezes, non-labored Abd: SNTND, BS present, no guarding or organomegaly Ext: No edema, warm Neuro: Alert and oriented, No gross deficits  Assessment and plan:  1. Body aches   2. Influenza with respiratory manifestation     Meds ordered this encounter  Medications  . oseltamivir (TAMIFLU) 75 MG capsule    Sig: Take 1 capsule (75 mg total) by mouth 2 (two) times daily.    Dispense:  10 capsule    Refill:  0    Orders Placed This Encounter  Procedures  . Veritor Flu A/B Waived    Order Specific Question:   Source    Answer:   nasal    Follow up as needed.  Claretta Fraise, MD

## 2017-02-28 ENCOUNTER — Encounter: Payer: Self-pay | Admitting: Family Medicine

## 2017-02-28 DIAGNOSIS — Z1231 Encounter for screening mammogram for malignant neoplasm of breast: Secondary | ICD-10-CM | POA: Diagnosis not present

## 2017-03-04 ENCOUNTER — Ambulatory Visit (INDEPENDENT_AMBULATORY_CARE_PROVIDER_SITE_OTHER): Payer: Medicare HMO | Admitting: Physician Assistant

## 2017-03-04 ENCOUNTER — Encounter: Payer: Self-pay | Admitting: Physician Assistant

## 2017-03-04 VITALS — BP 156/92 | HR 125 | Temp 97.8°F | Ht 61.0 in | Wt 143.4 lb

## 2017-03-04 DIAGNOSIS — R05 Cough: Secondary | ICD-10-CM

## 2017-03-04 DIAGNOSIS — J40 Bronchitis, not specified as acute or chronic: Secondary | ICD-10-CM

## 2017-03-04 DIAGNOSIS — R059 Cough, unspecified: Secondary | ICD-10-CM

## 2017-03-04 MED ORDER — AZITHROMYCIN 250 MG PO TABS: ORAL_TABLET | ORAL | 0 refills | Status: DC

## 2017-03-04 MED ORDER — METHYLPREDNISOLONE ACETATE 80 MG/ML IJ SUSP
80.0000 mg | Freq: Once | INTRAMUSCULAR | Status: AC
  Administered 2017-03-04: 80 mg via INTRAMUSCULAR

## 2017-03-04 MED ORDER — HYDROCODONE-HOMATROPINE 5-1.5 MG/5ML PO SYRP: 5.0000 mL | ORAL_SOLUTION | Freq: Four times a day (QID) | ORAL | 0 refills | Status: DC | PRN

## 2017-03-07 NOTE — Progress Notes (Signed)
BP (!) 156/92   Pulse (!) 125   Temp 97.8 F (36.6 C) (Oral)   Ht 5\' 1"  (1.549 m)   Wt 143 lb 6.4 oz (65 kg)   BMI 27.10 kg/m    Subjective:    Patient ID: Maria Moss, female    DOB: 04/18/1950, 67 y.o.   MRN: [id number]  HPI: Maria Moss is a 67 y.o. female presenting on 03/04/2017 for Generalized Body Aches; Wheezing; and Cough  Patient with several days of progressing upper respiratory and bronchial symptoms. Initially there was more upper respiratory congestion. This progressed to having significant cough that is productive throughout the day and severe at night. There is occasional wheezing after coughing. Sometimes there is slight dyspnea on exertion. It is productive mucus that is yellow in color. Denies any blood.   Past Medical History:  Diagnosis Date  . Ankle fracture, left 2006  . Diverticula, intestine    Mild case.  Marland Kitchen Hyperlipidemia   . Hypertension    Relevant past medical, surgical, family and social history reviewed and updated as indicated. Interim medical history since our last visit reviewed. Allergies and medications reviewed and updated. DATA REVIEWED: CHART IN EPIC  Family History reviewed for pertinent findings.  Review of Systems  Constitutional: Positive for chills and fatigue. Negative for activity change, appetite change and fever.  HENT: Positive for congestion, postnasal drip, sinus pain and sore throat.   Eyes: Negative.   Respiratory: Positive for cough and wheezing. Negative for shortness of breath.   Cardiovascular: Negative.  Negative for chest pain, palpitations and leg swelling.  Gastrointestinal: Negative.   Genitourinary: Negative.   Musculoskeletal: Negative.   Skin: Negative.   Neurological: Negative for headaches.    Allergies as of 03/04/2017      Reactions   Codeine Nausea And Vomiting   Sulfa Antibiotics       Medication List        Accurate as of 03/04/17 11:59 PM. Always use your most recent med list.            ALPRAZolam 0.25 MG tablet Commonly known as:  XANAX Take 1 tablet (0.25 mg total) by mouth at bedtime.   amLODipine 5 MG tablet Commonly known as:  NORVASC Take 1 tablet (5 mg total) by mouth daily.   azithromycin 250 MG tablet Commonly known as:  ZITHROMAX Z-PAK Take as directed   enalapril 20 MG tablet Commonly known as:  VASOTEC TAKE 1 AND 1/2 TABLETS EVERY DAY   Fish Oil 1000 MG Caps Take by mouth.   fluticasone 50 MCG/ACT nasal spray Commonly known as:  FLONASE USE 2 SPRAYS IN EACH NOSTRIL EVERY DAY   hydrochlorothiazide 25 MG tablet Commonly known as:  HYDRODIURIL TAKE 1 TABLET EVERY DAY   HYDROcodone-homatropine 5-1.5 MG/5ML syrup Commonly known as:  HYCODAN Take 5-10 mLs by mouth every 6 (six) hours as needed.   hyoscyamine 0.125 MG Tbdp disintergrating tablet Commonly known as:  ANASPAZ Place 1 tablet under the tongue every 6 (six) hours as needed.   loratadine 10 MG tablet Commonly known as:  CLARITIN Take 10 mg by mouth daily.   meloxicam 15 MG tablet Commonly known as:  MOBIC Take 1 tablet (15 mg total) by mouth daily.   omeprazole 40 MG capsule Commonly known as:  PRILOSEC TAKE 1 CAPSULE EVERY DAY   PROBIOTIC & ACIDOPHILUS EX ST PO Take by mouth.   simvastatin 20 MG tablet Commonly known as:  ZOCOR TAKE 1  TABLET AT BEDTIME   traMADol 50 MG tablet Commonly known as:  ULTRAM Take 1 tablet (50 mg total) by mouth 3 (three) times daily as needed.   traMADol 50 MG tablet Commonly known as:  ULTRAM Take 1 tablet (50 mg total) by mouth 3 (three) times daily as needed. Do not refill until 60 days from prescription date   traMADol 50 MG tablet Commonly known as:  ULTRAM Take 1 tablet (50 mg total) by mouth 3 (three) times daily as needed.   VITAMIN D-1000 MAX ST 1000 units tablet Generic drug:  Cholecalciferol Take by mouth.          Objective:    BP (!) 156/92   Pulse (!) 125   Temp 97.8 F (36.6 C) (Oral)   Ht 5\' 1"  (1.549 m)    Wt 143 lb 6.4 oz (65 kg)   BMI 27.10 kg/m   Allergies  Allergen Reactions  . Codeine Nausea And Vomiting  . Sulfa Antibiotics     Wt Readings from Last 3 Encounters:  03/04/17 143 lb 6.4 oz (65 kg)  02/08/17 145 lb 2 oz (65.8 kg)  01/13/17 147 lb (66.7 kg)    Physical Exam  Constitutional: She is oriented to person, place, and time. She appears well-developed and well-nourished.  HENT:  Head: Normocephalic and atraumatic.  Right Ear: There is drainage and tenderness.  Left Ear: There is drainage and tenderness.  Nose: Mucosal edema and rhinorrhea present. Right sinus exhibits no maxillary sinus tenderness and no frontal sinus tenderness. Left sinus exhibits no maxillary sinus tenderness and no frontal sinus tenderness.  Mouth/Throat: Oropharyngeal exudate and posterior oropharyngeal erythema present.  Eyes: Conjunctivae and EOM are normal. Pupils are equal, round, and reactive to light.  Neck: Normal range of motion. Neck supple.  Cardiovascular: Normal rate, regular rhythm, normal heart sounds and intact distal pulses.  Pulmonary/Chest: Effort normal. She has wheezes in the right upper field and the left upper field.  Abdominal: Soft. Bowel sounds are normal.  Neurological: She is alert and oriented to person, place, and time. She has normal reflexes.  Skin: Skin is warm and dry. No rash noted.  Psychiatric: She has a normal mood and affect. Her behavior is normal. Judgment and thought content normal.    Results for orders placed or performed in visit on 02/08/17  Veritor Flu A/B Waived  Result Value Ref Range   Influenza A Positive (A) Negative   Influenza B Negative Negative      Assessment & Plan:   1. Bronchitis - azithromycin (ZITHROMAX Z-PAK) 250 MG tablet; Take as directed  Dispense: 6 each; Refill: 0 - HYDROcodone-homatropine (HYCODAN) 5-1.5 MG/5ML syrup; Take 5-10 mLs by mouth every 6 (six) hours as needed.  Dispense: 240 mL; Refill: 0 - methylPREDNISolone  acetate (DEPO-MEDROL) injection 80 mg  2. Cough - HYDROcodone-homatropine (HYCODAN) 5-1.5 MG/5ML syrup; Take 5-10 mLs by mouth every 6 (six) hours as needed.  Dispense: 240 mL; Refill: 0   Continue all other maintenance medications as listed above.  Follow up plan: Return if symptoms worsen or fail to improve.  Educational handout given for [place] PA-C [place] [address]  [place], [place] [address] [phone]   03/07/2017, 10:52 AM

## 2017-04-14 ENCOUNTER — Encounter: Payer: Self-pay | Admitting: Family Medicine

## 2017-04-14 ENCOUNTER — Ambulatory Visit (INDEPENDENT_AMBULATORY_CARE_PROVIDER_SITE_OTHER): Payer: Medicare HMO

## 2017-04-14 ENCOUNTER — Other Ambulatory Visit: Payer: Self-pay | Admitting: *Deleted

## 2017-04-14 ENCOUNTER — Ambulatory Visit (INDEPENDENT_AMBULATORY_CARE_PROVIDER_SITE_OTHER): Payer: Medicare HMO | Admitting: Family Medicine

## 2017-04-14 VITALS — BP 134/76 | HR 67 | Temp 97.7°F | Ht 61.0 in | Wt 147.0 lb

## 2017-04-14 DIAGNOSIS — I10 Essential (primary) hypertension: Secondary | ICD-10-CM | POA: Diagnosis not present

## 2017-04-14 DIAGNOSIS — Z780 Asymptomatic menopausal state: Secondary | ICD-10-CM

## 2017-04-14 DIAGNOSIS — Z23 Encounter for immunization: Secondary | ICD-10-CM

## 2017-04-14 DIAGNOSIS — R5383 Other fatigue: Secondary | ICD-10-CM | POA: Diagnosis not present

## 2017-04-14 DIAGNOSIS — E7800 Pure hypercholesterolemia, unspecified: Secondary | ICD-10-CM

## 2017-04-14 DIAGNOSIS — G2581 Restless legs syndrome: Secondary | ICD-10-CM | POA: Diagnosis not present

## 2017-04-14 DIAGNOSIS — M8580 Other specified disorders of bone density and structure, unspecified site: Secondary | ICD-10-CM | POA: Diagnosis not present

## 2017-04-14 DIAGNOSIS — M545 Low back pain, unspecified: Secondary | ICD-10-CM

## 2017-04-14 DIAGNOSIS — M85851 Other specified disorders of bone density and structure, right thigh: Secondary | ICD-10-CM | POA: Diagnosis not present

## 2017-04-14 DIAGNOSIS — G8929 Other chronic pain: Secondary | ICD-10-CM

## 2017-04-14 DIAGNOSIS — Z0289 Encounter for other administrative examinations: Secondary | ICD-10-CM

## 2017-04-14 MED ORDER — TRAMADOL HCL 50 MG PO TABS: 50.0000 mg | ORAL_TABLET | Freq: Three times a day (TID) | ORAL | 0 refills | Status: DC | PRN

## 2017-04-14 MED ORDER — BACLOFEN 10 MG PO TABS: 10.0000 mg | ORAL_TABLET | Freq: Every evening | ORAL | 5 refills | Status: DC | PRN

## 2017-04-14 NOTE — Progress Notes (Signed)
BP 134/76   Pulse 67   Temp 97.7 F (36.5 C) (Oral)   Ht 5' 1"  (1.549 m)   Wt 147 lb (66.7 kg)   BMI 27.78 kg/m    Subjective:    Patient ID: Maria Moss, female    DOB: 31-Dec-1950, 67 y.o.   MRN: [id number]  HPI: Maria Moss is a 67 y.o. female presenting on 04/14/2017 for Hyperlipidemia (3 mo); Hypertension (complains of fatigue all the time, would like to have labwork to make sure levels are all okay); and Chest congestion   HPI Hypertension Patient is currently on amlodipine and enalapril, and their blood pressure today is 134/76. Patient denies any lightheadedness or dizziness. Patient denies headaches, blurred vision, chest pains, shortness of breath, or weakness. Denies any side effects from medication and is content with current medication.   Hyperlipidemia Patient is coming in for recheck of his hyperlipidemia. The patient is currently taking fish oil and simvastatin although patient reports that she has not been taking it recently. They deny any issues with myalgias or history of liver damage from it. They deny any focal numbness or weakness or chest pain.   Fatigue and restless legs and difficulty sleeping Patient comes in complaining of fatigue and restless legs and difficulty sleeping.  She says that she has been dealing with this more over the past few weeks.  She says that her energy has been down.  She wants to check some labs to see if there is any other reason for fatigue but she says her sleep has been a big issue for her.  Relevant past medical, surgical, family and social history reviewed and updated as indicated. Interim medical history since our last visit reviewed. Allergies and medications reviewed and updated.  Review of Systems  Constitutional: Positive for fatigue. Negative for chills and fever.  Eyes: Negative for visual disturbance.  Respiratory: Negative for chest tightness and shortness of breath.   Cardiovascular: Negative for chest pain and leg  swelling.  Genitourinary: Negative for difficulty urinating and dysuria.  Musculoskeletal: Positive for myalgias. Negative for back pain and gait problem.  Skin: Negative for rash.  Neurological: Negative for dizziness, light-headedness and headaches.  Psychiatric/Behavioral: Positive for sleep disturbance. Negative for agitation and behavioral problems.  All other systems reviewed and are negative.   Per HPI unless specifically indicated above   Allergies as of 04/14/2017      Reactions   Codeine Nausea And Vomiting   Sulfa Antibiotics       Medication List        Accurate as of 04/14/17  9:36 AM. Always use your most recent med list.          ALPRAZolam 0.25 MG tablet Commonly known as:  XANAX Take 1 tablet (0.25 mg total) by mouth at bedtime.   amLODipine 5 MG tablet Commonly known as:  NORVASC Take 1 tablet (5 mg total) by mouth daily.   enalapril 20 MG tablet Commonly known as:  VASOTEC TAKE 1 AND 1/2 TABLETS EVERY DAY   Fish Oil 1000 MG Caps Take 2,000 mg by mouth.   fluticasone 50 MCG/ACT nasal spray Commonly known as:  FLONASE USE 2 SPRAYS IN EACH NOSTRIL EVERY DAY   hydrochlorothiazide 25 MG tablet Commonly known as:  HYDRODIURIL TAKE 1 TABLET EVERY DAY   hyoscyamine 0.125 MG Tbdp disintergrating tablet Commonly known as:  ANASPAZ Place 1 tablet under the tongue every 6 (six) hours as needed.   loratadine 10 MG  tablet Commonly known as:  CLARITIN Take 10 mg by mouth daily.   meloxicam 15 MG tablet Commonly known as:  MOBIC Take 1 tablet (15 mg total) by mouth daily.   omeprazole 40 MG capsule Commonly known as:  PRILOSEC TAKE 1 CAPSULE EVERY DAY   PROBIOTIC & ACIDOPHILUS EX ST PO Take by mouth.   simvastatin 20 MG tablet Commonly known as:  ZOCOR TAKE 1 TABLET AT BEDTIME   traMADol 50 MG tablet Commonly known as:  ULTRAM Take 1 tablet (50 mg total) by mouth 3 (three) times daily as needed.   traMADol 50 MG tablet Commonly known as:   ULTRAM Take 1 tablet (50 mg total) by mouth 3 (three) times daily as needed. Do not refill until 60 days from prescription date   VITAMIN D-1000 MAX ST 1000 units tablet Generic drug:  Cholecalciferol Take by mouth.          Objective:    BP 134/76   Pulse 67   Temp 97.7 F (36.5 C) (Oral)   Ht 5' 1"  (1.549 m)   Wt 147 lb (66.7 kg)   BMI 27.78 kg/m   Wt Readings from Last 3 Encounters:  04/14/17 147 lb (66.7 kg)  03/04/17 143 lb 6.4 oz (65 kg)  02/08/17 145 lb 2 oz (65.8 kg)    Physical Exam  Constitutional: She is oriented to person, place, and time. She appears well-developed and well-nourished. No distress.  Eyes: Conjunctivae are normal.  Neck: Neck supple. No thyromegaly present.  Cardiovascular: Normal rate, regular rhythm, normal heart sounds and intact distal pulses.  No murmur heard. Pulmonary/Chest: Effort normal and breath sounds normal. No respiratory distress. She has no wheezes.  Musculoskeletal: Normal range of motion. She exhibits no edema or tenderness.  Lymphadenopathy:    She has no cervical adenopathy.  Neurological: She is alert and oriented to person, place, and time. Coordination normal.  Skin: Skin is warm and dry. No rash noted. She is not diaphoretic.  Psychiatric: She has a normal mood and affect. Her behavior is normal.  Nursing note and vitals reviewed.       Assessment & Plan:   Problem List Items Addressed This Visit      Cardiovascular and Mediastinum   Hypertension - Primary (Chronic)   Relevant Orders   CMP14+EGFR     Other   Hyperlipemia (Chronic)   Relevant Orders   Lipid panel    Other Visit Diagnoses    Other fatigue       Relevant Medications   baclofen (LIORESAL) 10 MG tablet   Other Relevant Orders   CBC with Differential/Platelet   TSH   VITAMIN D 25 Hydroxy (Vit-D Deficiency, Fractures)   Restless legs       Relevant Medications   baclofen (LIORESAL) 10 MG tablet   Post-menopause       Relevant Orders    DG Bone Density       Follow up plan: Return if symptoms worsen or fail to improve.  Counseling provided for all of the vaccine components Orders Placed This Encounter  Procedures  . Pneumococcal conjugate vaccine 13-valent IM  . CMP14+EGFR  . CBC with Differential/Platelet  . Lipid panel  . TSH  . VITAMIN D 25 Hydroxy (Vit-D Deficiency, Fractures)    Caryl Pina, MD [facility] 04/14/2017, 9:36 AM

## 2017-04-15 ENCOUNTER — Telehealth: Payer: Self-pay | Admitting: Family Medicine

## 2017-04-15 LAB — CBC WITH DIFFERENTIAL/PLATELET
BASOS ABS: 0 10*3/uL (ref 0.0–0.2)
BASOS: 0 %
EOS (ABSOLUTE): 0.2 10*3/uL (ref 0.0–0.4)
Eos: 3 %
Hematocrit: 33.7 % — ABNORMAL LOW (ref 34.0–46.6)
Hemoglobin: 11.3 g/dL (ref 11.1–15.9)
IMMATURE GRANS (ABS): 0 10*3/uL (ref 0.0–0.1)
IMMATURE GRANULOCYTES: 0 %
LYMPHS: 24 %
Lymphocytes Absolute: 2.1 10*3/uL (ref 0.7–3.1)
MCH: 28.1 pg (ref 26.6–33.0)
MCHC: 33.5 g/dL (ref 31.5–35.7)
MCV: 84 fL (ref 79–97)
Monocytes Absolute: 0.9 10*3/uL (ref 0.1–0.9)
Monocytes: 10 %
NEUTROS PCT: 63 %
Neutrophils Absolute: 5.6 10*3/uL (ref 1.4–7.0)
PLATELETS: 533 10*3/uL — AB (ref 150–379)
RBC: 4.02 x10E6/uL (ref 3.77–5.28)
RDW: 14.5 % (ref 12.3–15.4)
WBC: 8.8 10*3/uL (ref 3.4–10.8)

## 2017-04-15 LAB — CMP14+EGFR
A/G RATIO: 1.8 (ref 1.2–2.2)
ALK PHOS: 137 IU/L — AB (ref 39–117)
ALT: 21 IU/L (ref 0–32)
AST: 23 IU/L (ref 0–40)
Albumin: 4.5 g/dL (ref 3.6–4.8)
BUN/Creatinine Ratio: 27 (ref 12–28)
BUN: 31 mg/dL — ABNORMAL HIGH (ref 8–27)
Bilirubin Total: 0.6 mg/dL (ref 0.0–1.2)
CALCIUM: 10.4 mg/dL — AB (ref 8.7–10.3)
CO2: 27 mmol/L (ref 20–29)
Chloride: 98 mmol/L (ref 96–106)
Creatinine, Ser: 1.15 mg/dL — ABNORMAL HIGH (ref 0.57–1.00)
GFR calc Af Amer: 58 mL/min/{1.73_m2} — ABNORMAL LOW (ref >59)
GFR, EST NON AFRICAN AMERICAN: 50 mL/min/{1.73_m2} — AB (ref >59)
Globulin, Total: 2.5 g/dL (ref 1.5–4.5)
Glucose: 90 mg/dL (ref 65–99)
POTASSIUM: 5 mmol/L (ref 3.5–5.2)
SODIUM: 139 mmol/L (ref 134–144)
Total Protein: 7 g/dL (ref 6.0–8.5)

## 2017-04-15 LAB — LIPID PANEL
CHOLESTEROL TOTAL: 228 mg/dL — AB (ref 100–199)
Chol/HDL Ratio: 2.7 ratio (ref 0.0–4.4)
HDL: 85 mg/dL (ref >39)
LDL Calculated: 120 mg/dL — ABNORMAL HIGH (ref 0–99)
TRIGLYCERIDES: 114 mg/dL (ref 0–149)
VLDL CHOLESTEROL CAL: 23 mg/dL (ref 5–40)

## 2017-04-15 LAB — VITAMIN D 25 HYDROXY (VIT D DEFICIENCY, FRACTURES): Vit D, 25-Hydroxy: 29.1 ng/mL — ABNORMAL LOW (ref 30.0–100.0)

## 2017-04-15 LAB — TSH: TSH: 0.914 u[IU]/mL (ref 0.450–4.500)

## 2017-04-15 NOTE — Telephone Encounter (Signed)
Aware of results. 

## 2017-04-19 ENCOUNTER — Encounter: Payer: Self-pay | Admitting: *Deleted

## 2017-04-21 ENCOUNTER — Telehealth: Payer: Self-pay

## 2017-04-21 ENCOUNTER — Telehealth: Payer: Self-pay | Admitting: Family Medicine

## 2017-04-21 NOTE — Telephone Encounter (Signed)
Dexa results discussed with patient

## 2017-04-21 NOTE — Telephone Encounter (Signed)
Have her try different formulations of it, there are a lot of different kinds on the market and sometimes going to a different formulation or may be one that comes in a capsule will do better for her.  Take it with food

## 2017-04-21 NOTE — Telephone Encounter (Signed)
Called and gave patient her dexa results and advised to take Vitamin D and Calcium. She is currently taking calclium and has started the additional vit D but it is making her sick to her stomach. She states this happened before and she was unable to take it. Please advise on what else she can do

## 2017-04-21 NOTE — Telephone Encounter (Signed)
Aware. 

## 2017-04-28 ENCOUNTER — Ambulatory Visit: Payer: Medicare HMO | Admitting: *Deleted

## 2017-05-06 ENCOUNTER — Other Ambulatory Visit: Payer: Self-pay | Admitting: Nurse Practitioner

## 2017-05-06 DIAGNOSIS — K5730 Diverticulosis of large intestine without perforation or abscess without bleeding: Secondary | ICD-10-CM

## 2017-06-02 ENCOUNTER — Telehealth: Payer: Self-pay | Admitting: Family Medicine

## 2017-06-02 ENCOUNTER — Other Ambulatory Visit: Payer: Self-pay | Admitting: Family Medicine

## 2017-06-02 ENCOUNTER — Ambulatory Visit (INDEPENDENT_AMBULATORY_CARE_PROVIDER_SITE_OTHER): Payer: Medicare HMO | Admitting: Family Medicine

## 2017-06-02 VITALS — BP 149/91 | HR 106 | Ht 61.0 in | Wt 148.0 lb

## 2017-06-02 DIAGNOSIS — N12 Tubulo-interstitial nephritis, not specified as acute or chronic: Secondary | ICD-10-CM | POA: Diagnosis not present

## 2017-06-02 DIAGNOSIS — R300 Dysuria: Secondary | ICD-10-CM

## 2017-06-02 LAB — URINALYSIS, COMPLETE
BILIRUBIN UA: NEGATIVE
Glucose, UA: NEGATIVE
Ketones, UA: NEGATIVE
Nitrite, UA: NEGATIVE
Protein, UA: NEGATIVE
RBC UA: NEGATIVE
SPEC GRAV UA: 1.01 (ref 1.005–1.030)
UUROB: 0.2 mg/dL (ref 0.2–1.0)
pH, UA: 7 (ref 5.0–7.5)

## 2017-06-02 LAB — CBC WITH DIFFERENTIAL/PLATELET
BASOS ABS: 0 10*3/uL (ref 0.0–0.2)
Basos: 0 %
EOS (ABSOLUTE): 0.2 10*3/uL (ref 0.0–0.4)
Eos: 2 %
Hematocrit: 34 % (ref 34.0–46.6)
Hemoglobin: 11.3 g/dL (ref 11.1–15.9)
Immature Grans (Abs): 0 10*3/uL (ref 0.0–0.1)
Immature Granulocytes: 0 %
LYMPHS ABS: 2.3 10*3/uL (ref 0.7–3.1)
LYMPHS: 21 %
MCH: 28.6 pg (ref 26.6–33.0)
MCHC: 33.2 g/dL (ref 31.5–35.7)
MCV: 86 fL (ref 79–97)
Monocytes Absolute: 1 10*3/uL — ABNORMAL HIGH (ref 0.1–0.9)
Monocytes: 9 %
NEUTROS ABS: 7.2 10*3/uL — AB (ref 1.4–7.0)
Neutrophils: 68 %
PLATELETS: 611 10*3/uL — AB (ref 150–450)
RBC: 3.95 x10E6/uL (ref 3.77–5.28)
RDW: 14.2 % (ref 12.3–15.4)
WBC: 10.7 10*3/uL (ref 3.4–10.8)

## 2017-06-02 LAB — BASIC METABOLIC PANEL
BUN / CREAT RATIO: 27 (ref 12–28)
BUN: 27 mg/dL (ref 8–27)
CALCIUM: 10.5 mg/dL — AB (ref 8.7–10.3)
CHLORIDE: 94 mmol/L — AB (ref 96–106)
CO2: 24 mmol/L (ref 20–29)
Creatinine, Ser: 1 mg/dL (ref 0.57–1.00)
GFR calc non Af Amer: 59 mL/min/{1.73_m2} — ABNORMAL LOW (ref >59)
GFR, EST AFRICAN AMERICAN: 68 mL/min/{1.73_m2} (ref >59)
GLUCOSE: 106 mg/dL — AB (ref 65–99)
Potassium: 4.5 mmol/L (ref 3.5–5.2)
Sodium: 134 mmol/L (ref 134–144)

## 2017-06-02 LAB — MICROSCOPIC EXAMINATION

## 2017-06-02 MED ORDER — ONDANSETRON 4 MG PO TBDP: 4.0000 mg | ORAL_TABLET | Freq: Three times a day (TID) | ORAL | 0 refills | Status: DC | PRN

## 2017-06-02 MED ORDER — CEPHALEXIN 250 MG PO CAPS: 250.0000 mg | ORAL_CAPSULE | Freq: Four times a day (QID) | ORAL | 0 refills | Status: AC

## 2017-06-02 MED ORDER — CEFTRIAXONE SODIUM 1 G IJ SOLR
1.0000 g | Freq: Once | INTRAMUSCULAR | Status: AC
  Administered 2017-06-02: 1 g via INTRAMUSCULAR

## 2017-06-02 NOTE — Progress Notes (Signed)
Subjective: CC: ?UTI PCP: Dettinger, Fransisca Kaufmann, MD ZOX:[id number] K Novack is a 67 y.o. female presenting to clinic today for:  1. Urinary symptoms Patient reports a 2 week h/o left sided flank pain, dysuria and intermittent nausea.  She reports associated lower abdominal discomfort/ pressure and urinary urgency.  Denies urinary frequency, hematuria, fevers, chills, vaginal discharge.  Patient has used cranberry juice for symptoms.  Patient denies a h/o frequent or recurrent UTIs.    ROS: Per HPI  Allergies  Allergen Reactions  . Codeine Nausea And Vomiting  . Sulfa Antibiotics    Past Medical History:  Diagnosis Date  . Ankle fracture, left 2006  . Diverticula, intestine    Mild case.  Marland Kitchen Hyperlipidemia   . Hypertension     Current Outpatient Medications:  .  ALPRAZolam (XANAX) 0.25 MG tablet, Take 1 tablet (0.25 mg total) by mouth at bedtime., Disp: 30 tablet, Rfl: 2 .  amLODipine (NORVASC) 5 MG tablet, Take 1 tablet (5 mg total) by mouth daily., Disp: 90 tablet, Rfl: 1 .  baclofen (LIORESAL) 10 MG tablet, Take 1 tablet (10 mg total) by mouth at bedtime as needed for muscle spasms., Disp: 30 each, Rfl: 5 .  Cholecalciferol (VITAMIN D-1000 MAX ST) 1000 units tablet, Take by mouth., Disp: , Rfl:  .  enalapril (VASOTEC) 20 MG tablet, TAKE 1 AND 1/2 TABLETS EVERY DAY, Disp: 135 tablet, Rfl: 1 .  fluticasone (FLONASE) 50 MCG/ACT nasal spray, USE 2 SPRAYS IN EACH NOSTRIL EVERY DAY, Disp: 48 g, Rfl: 1 .  hydrochlorothiazide (HYDRODIURIL) 25 MG tablet, TAKE 1 TABLET EVERY DAY, Disp: 90 tablet, Rfl: 1 .  hyoscyamine (ANASPAZ) 0.125 MG TBDP disintergrating tablet, Place 1 tablet under the tongue every 6 (six) hours as needed., Disp: , Rfl:  .  hyoscyamine (ANASPAZ) 0.125 MG TBDP disintergrating tablet, Take 1 tablet (0.125 mg total) by mouth every 6 (six) hours as needed., Disp: 30 tablet, Rfl: 0 .  loratadine (CLARITIN) 10 MG tablet, Take 10 mg by mouth daily., Disp: , Rfl:  .  meloxicam (MOBIC)  15 MG tablet, Take 1 tablet (15 mg total) by mouth daily., Disp: 90 tablet, Rfl: 1 .  Omega-3 Fatty Acids (FISH OIL) 1000 MG CAPS, Take 2,000 mg by mouth. , Disp: , Rfl:  .  omeprazole (PRILOSEC) 40 MG capsule, TAKE 1 CAPSULE EVERY DAY, Disp: 90 capsule, Rfl: 1 .  Probiotic Product (PROBIOTIC & ACIDOPHILUS EX ST PO), Take by mouth., Disp: , Rfl:  .  traMADol (ULTRAM) 50 MG tablet, Take 1 tablet (50 mg total) by mouth 3 (three) times daily as needed., Disp: 90 tablet, Rfl: 0 .  traMADol (ULTRAM) 50 MG tablet, Take 1 tablet (50 mg total) by mouth 3 (three) times daily as needed. Do not refill until 60 days from prescription date, Disp: 90 tablet, Rfl: 0 Social History   Socioeconomic History  . Marital status: Married    Spouse name: Not on file  . Number of children: Not on file  . Years of education: Not on file  . Highest education level: Not on file  Occupational History  . Not on file  Social Needs  . Financial resource strain: Not on file  . Food insecurity:    Worry: Not on file    Inability: Not on file  . Transportation needs:    Medical: Not on file    Non-medical: Not on file  Tobacco Use  . Smoking status: Never Smoker  . Smokeless tobacco: Never  Used  Substance and Sexual Activity  . Alcohol use: No  . Drug use: No  . Sexual activity: Not on file  Lifestyle  . Physical activity:    Days per week: Not on file    Minutes per session: Not on file  . Stress: Not on file  Relationships  . Social connections:    Talks on phone: Not on file    Gets together: Not on file    Attends religious service: Not on file    Active member of club or organization: Not on file    Attends meetings of clubs or organizations: Not on file    Relationship status: Not on file  . Intimate partner violence:    Fear of current or ex partner: Not on file    Emotionally abused: Not on file    Physically abused: Not on file    Forced sexual activity: Not on file  Other Topics Concern  .  Not on file  Social History Narrative  . Not on file   Family History  Problem Relation Age of Onset  . Heart disease Mother        CHF  . Cancer Mother        Uterine  . Osteoporosis Mother   . COPD Sister   . Asthma Sister   . Migraines Sister     Objective: Office vital signs reviewed. BP (!) 149/91   Pulse (!) 106   Ht 5\' 1"  (1.549 m)   Wt 148 lb (67.1 kg)   BMI 27.96 kg/m   Physical Examination:  General: Awake, alert, nontoxic, No acute distress GU: +suprapubic TTP MSK: + Left sided CVA TTP.  Assessment/ Plan: 67 y.o. female   1. Pyelonephritis Clinically very suspicious for pyelonephritis.  Unfortunately, she was unable to provide a urine sample during the visit.  She will bring a urine sample back.  I have elected to proceed with treatment given symptoms.  She was given a dose of Rocephin IM.  She has been prescribed Keflex p.o. 4 times daily for the next 7 days.  We will plan for urine culture once urinalysis has resulted.  Home care instructions reviewed and reasons for emergent evaluation emergency department discussed.  Patient voiced good understanding will follow-up as needed. - cefTRIAXone (ROCEPHIN) injection 1 g - Basic Metabolic Panel - CBC with Differential  2. Dysuria - Urinalysis, Complete   Orders Placed This Encounter  Procedures  . Urinalysis, Complete  . Basic Metabolic Panel  . CBC with Differential   Meds ordered this encounter  Medications  . cefTRIAXone (ROCEPHIN) injection 1 g  . cephALEXin (KEFLEX) 250 MG capsule    Sig: Take 1 capsule (250 mg total) by mouth 4 (four) times daily for 10 days.    Dispense:  40 capsule    Refill:  0  . ondansetron (ZOFRAN ODT) 4 MG disintegrating tablet    Sig: Take 1 tablet (4 mg total) by mouth every 8 (eight) hours as needed for nausea or vomiting.    Dispense:  10 tablet    Refill:  0     Ashly Windell Moulding, DO [place] [phone]

## 2017-06-02 NOTE — Telephone Encounter (Signed)
Pt aware - no result yet

## 2017-06-02 NOTE — Patient Instructions (Signed)
Bring your urine and as soon as you are able to urinate.  You are given a dose of Rocephin intramuscularly today.  This may or may not alter the results.  I sent you in cephalexin to take 4 times a day.  You should be able to get at least 2 or 3 doses in today.  You had labs performed today.  You will be contacted with the results of the labs once they are available, usually in the next 3 days for routine lab work.  Pyelonephritis, Adult Pyelonephritis is a kidney infection. The kidneys are organs that help clean your blood by moving waste out of your blood and into your pee (urine). This infection can happen quickly, or it can last for a long time. In most cases, it clears up with treatment and does not cause other problems. Follow these instructions at home: Medicines  Take over-the-counter and prescription medicines only as told by your doctor.  Take your antibiotic medicine as told by your doctor. Do not stop taking the medicine even if you start to feel better. General instructions  Drink enough fluid to keep your pee clear or pale yellow.  Avoid caffeine, tea, and carbonated drinks.  Pee (urinate) often. Avoid holding in pee for long periods of time.  Pee before and after sex.  After pooping (having a bowel movement), women should wipe from front to back. Use each tissue only once.  Keep all follow-up visits as told by your doctor. This is important. Contact a doctor if:  You do not feel better after 2 days.  Your symptoms get worse.  You have a fever. Get help right away if:  You cannot take your medicine or drink fluids as told.  You have chills and shaking.  You throw up (vomit).  You have very bad pain in your side (flank) or back.  You feel very weak or you pass out (faint). This information is not intended to replace advice given to you by your health care provider. Make sure you discuss any questions you have with your health care provider. Document Released:  01/29/2004 Document Revised: 05/29/2015 Document Reviewed: 04/15/2014 Elsevier Interactive Patient Education  [employer].

## 2017-06-06 ENCOUNTER — Telehealth: Payer: Self-pay | Admitting: Family Medicine

## 2017-06-06 ENCOUNTER — Other Ambulatory Visit: Payer: Medicare HMO

## 2017-06-06 ENCOUNTER — Other Ambulatory Visit: Payer: Self-pay | Admitting: *Deleted

## 2017-06-06 DIAGNOSIS — R300 Dysuria: Secondary | ICD-10-CM

## 2017-06-06 NOTE — Telephone Encounter (Signed)
Yes go ahead and have her do a urine culture and put in the order for her.  Platelets can go up with any kind of inflammatory reaction or infection in both times were checked that she is had something going on but it has increased.  I do want to recheck it but I want to recheck it at a time where she does not have any early tract symptoms or infections going on.

## 2017-06-06 NOTE — Telephone Encounter (Signed)
Aware of infection causing fluctuation in lab results, per details left on voice mail.

## 2017-06-06 NOTE — Telephone Encounter (Signed)
Patient will ring urine by for culture. Patient is wanting to know what to do about elevated platelets.

## 2017-06-07 LAB — URINE CULTURE

## 2017-06-11 ENCOUNTER — Telehealth: Payer: Self-pay | Admitting: Family Medicine

## 2017-06-11 NOTE — Telephone Encounter (Signed)
Urine culture did not grow anything significant

## 2017-06-11 NOTE — Telephone Encounter (Signed)
Aware of results. 

## 2017-06-21 ENCOUNTER — Other Ambulatory Visit: Payer: Self-pay | Admitting: Family Medicine

## 2017-06-21 DIAGNOSIS — K5730 Diverticulosis of large intestine without perforation or abscess without bleeding: Secondary | ICD-10-CM

## 2017-07-01 ENCOUNTER — Other Ambulatory Visit: Payer: Self-pay | Admitting: Family

## 2017-07-01 ENCOUNTER — Encounter: Payer: Self-pay | Admitting: Family

## 2017-07-01 ENCOUNTER — Ambulatory Visit (INDEPENDENT_AMBULATORY_CARE_PROVIDER_SITE_OTHER): Payer: Medicare HMO | Admitting: Family

## 2017-07-01 VITALS — BP 137/79 | HR 73 | Temp 98.2°F | Ht 61.0 in | Wt 148.0 lb

## 2017-07-01 DIAGNOSIS — R5383 Other fatigue: Secondary | ICD-10-CM | POA: Diagnosis not present

## 2017-07-01 DIAGNOSIS — I10 Essential (primary) hypertension: Secondary | ICD-10-CM

## 2017-07-01 DIAGNOSIS — Z6827 Body mass index (BMI) 27.0-27.9, adult: Secondary | ICD-10-CM | POA: Diagnosis not present

## 2017-07-01 MED ORDER — PANTOPRAZOLE SODIUM 40 MG PO TBEC: 40.0000 mg | DELAYED_RELEASE_TABLET | Freq: Every day | ORAL | 3 refills | Status: DC

## 2017-07-01 NOTE — Patient Instructions (Signed)

## 2017-07-01 NOTE — Telephone Encounter (Signed)
Protonix Prescription sent to pharmacy. Avoid fried, spicy, citrus foods, caffeine and alcohol, Do not eat 2-3 hours before bedtime, Encouraged small frequent meals, Avoid NSAID's

## 2017-07-01 NOTE — Progress Notes (Signed)
   Subjective:    Patient ID: Maria Moss, female    DOB: 11-12-1950, 67 y.o.   MRN: [id number]  Chief Complaint  Patient presents with  . Blood Pressure Check    feels weak and fatique   Pt presents to the office today with fatigue that started several months that has gradually worsen.  Hypertension  This is a chronic problem. The current episode started more than 1 year ago. The problem has been resolved since onset. The problem is controlled. Associated symptoms include malaise/fatigue and peripheral edema. Pertinent negatives include no shortness of breath. Risk factors for coronary artery disease include dyslipidemia. Past treatments include ACE inhibitors, calcium channel blockers and diuretics. The current treatment provides moderate improvement. There is no history of kidney disease.  Gastroesophageal Reflux  She complains of heartburn. She reports no belching or no coughing. This is a chronic problem. The current episode started more than 1 year ago. The problem occurs occasionally. The problem has been waxing and waning. The symptoms are aggravated by certain foods and lying down. She has tried a PPI for the symptoms. The treatment provided moderate relief.      Review of Systems  Constitutional: Positive for malaise/fatigue.  Respiratory: Negative for cough and shortness of breath.   Gastrointestinal: Positive for heartburn.  All other systems reviewed and are negative.      Objective:   Physical Exam  Constitutional: She is oriented to person, place, and time. She appears well-developed and well-nourished. No distress.  HENT:  Head: Normocephalic and atraumatic.  Right Ear: External ear normal.  Left Ear: External ear normal.  Mouth/Throat: Oropharynx is clear and moist.  Eyes: Pupils are equal, round, and reactive to light.  Neck: Normal range of motion. Neck supple. No thyromegaly present.  Cardiovascular: Normal rate, regular rhythm, normal heart sounds and intact  distal pulses.  No murmur heard. Pulmonary/Chest: Effort normal and breath sounds normal. No respiratory distress. She has no wheezes.  Abdominal: Soft. Bowel sounds are normal. She exhibits no distension. There is no tenderness.  Musculoskeletal: Normal range of motion. She exhibits edema (trace BLE). She exhibits no tenderness.  Neurological: She is alert and oriented to person, place, and time. She has normal reflexes. No cranial nerve deficit.  Skin: Skin is warm and dry.  Psychiatric: She has a normal mood and affect. Her behavior is normal. Judgment and thought content normal.  Vitals reviewed.     BP 137/79   Pulse 73   Temp 98.2 F (36.8 C) (Oral)   Ht 5\' 1"  (1.549 m)   Wt 148 lb (67.1 kg)   BMI 27.96 kg/m      Assessment & Plan:  [name] comes in today with chief complaint of Blood Pressure Check (feels weak and fatique)   Diagnosis and orders addressed:  1. Essential hypertension  2. Fatigue, unspecified type - EKG 12-Lead  3. BMI 27.0-27.9,adult  BP normal today Labs reviewed Keep follow up with PCP Encourage rest  RTO as needed   [name], FNP

## 2017-07-01 NOTE — Telephone Encounter (Signed)
Pt aware.

## 2017-07-02 ENCOUNTER — Other Ambulatory Visit: Payer: Self-pay | Admitting: Family Medicine

## 2017-07-02 DIAGNOSIS — I10 Essential (primary) hypertension: Secondary | ICD-10-CM

## 2017-07-08 ENCOUNTER — Other Ambulatory Visit: Payer: Self-pay | Admitting: Family Medicine

## 2017-07-08 ENCOUNTER — Other Ambulatory Visit: Payer: Self-pay | Admitting: *Deleted

## 2017-07-08 DIAGNOSIS — G2581 Restless legs syndrome: Secondary | ICD-10-CM

## 2017-07-08 DIAGNOSIS — M545 Low back pain: Principal | ICD-10-CM

## 2017-07-08 DIAGNOSIS — Z0289 Encounter for other administrative examinations: Secondary | ICD-10-CM

## 2017-07-08 DIAGNOSIS — G8929 Other chronic pain: Secondary | ICD-10-CM

## 2017-07-08 DIAGNOSIS — R5383 Other fatigue: Secondary | ICD-10-CM

## 2017-07-08 MED ORDER — BACLOFEN 10 MG PO TABS: 10.0000 mg | ORAL_TABLET | Freq: Every evening | ORAL | 2 refills | Status: DC | PRN

## 2017-07-15 ENCOUNTER — Encounter: Payer: Self-pay | Admitting: Family Medicine

## 2017-07-15 ENCOUNTER — Ambulatory Visit (INDEPENDENT_AMBULATORY_CARE_PROVIDER_SITE_OTHER): Payer: Medicare HMO | Admitting: Family Medicine

## 2017-07-15 VITALS — BP 136/79 | HR 68 | Temp 98.6°F | Ht 61.0 in | Wt 145.0 lb

## 2017-07-15 DIAGNOSIS — M5136 Other intervertebral disc degeneration, lumbar region: Secondary | ICD-10-CM

## 2017-07-15 DIAGNOSIS — E7800 Pure hypercholesterolemia, unspecified: Secondary | ICD-10-CM | POA: Diagnosis not present

## 2017-07-15 DIAGNOSIS — G8929 Other chronic pain: Secondary | ICD-10-CM | POA: Diagnosis not present

## 2017-07-15 DIAGNOSIS — Z6827 Body mass index (BMI) 27.0-27.9, adult: Secondary | ICD-10-CM

## 2017-07-15 DIAGNOSIS — M545 Low back pain: Secondary | ICD-10-CM

## 2017-07-15 DIAGNOSIS — F5101 Primary insomnia: Secondary | ICD-10-CM

## 2017-07-15 DIAGNOSIS — I10 Essential (primary) hypertension: Secondary | ICD-10-CM | POA: Diagnosis not present

## 2017-07-15 DIAGNOSIS — Z0289 Encounter for other administrative examinations: Secondary | ICD-10-CM

## 2017-07-15 MED ORDER — DULOXETINE HCL 30 MG PO CPEP: 30.0000 mg | ORAL_CAPSULE | Freq: Every day | ORAL | 2 refills | Status: DC

## 2017-07-15 MED ORDER — TRAMADOL HCL 50 MG PO TABS: 50.0000 mg | ORAL_TABLET | Freq: Three times a day (TID) | ORAL | 0 refills | Status: DC | PRN

## 2017-07-15 MED ORDER — ONDANSETRON 4 MG PO TBDP: 4.0000 mg | ORAL_TABLET | Freq: Three times a day (TID) | ORAL | 2 refills | Status: DC | PRN

## 2017-07-15 MED ORDER — ALPRAZOLAM 0.25 MG PO TABS: 0.2500 mg | ORAL_TABLET | Freq: Every day | ORAL | 2 refills | Status: DC

## 2017-07-15 NOTE — Progress Notes (Signed)
BP 136/79   Pulse 68   Temp 98.6 F (37 C) (Oral)   Ht 5\' 1"  (1.549 m)   Wt 145 lb (65.8 kg)   BMI 27.40 kg/m    Subjective:    Patient ID: [name], female    DOB: December 21, 1950, 67 y.o.   MRN: [id number]  HPI: Maria Moss is a 67 y.o. female presenting on 07/15/2017 for Hyperlipidemia and Hypertension   HPI Hypertension Patient is currently on amlodipine and enalapril and hydrochlorothiazide, and their blood pressure today is 136/79. Patient denies any lightheadedness or dizziness. Patient denies headaches, blurred vision, chest pains, shortness of breath, or weakness. Denies any side effects from medication and is content with current medication.   Hyperlipidemia Patient is coming in for recheck of his hyperlipidemia. The patient is currently taking omega-3's. They deny any issues with myalgias or history of liver damage from it. They deny any focal numbness or weakness or chest pain.   Chronic low back pain and pain management agreement Patient currently has a pain management agreement for chronic low back pain which she rates as a 1 while she is taking her medications and says that it works pretty well for her.  She currently takes tramadol 50 mg 3 times daily as needed.  Insomnia Patient is coming in for refill of medications for insomnia which is why she takes the alprazolam and the Cymbalta and says that they are working well for him she denies any mood swings or depression or suicidal ideations.  Relevant past medical, surgical, family and social history reviewed and updated as indicated. Interim medical history since our last visit reviewed. Allergies and medications reviewed and updated.  Review of Systems  Constitutional: Negative for chills and fever.  HENT: Negative for congestion, ear discharge and ear pain.   Eyes: Negative for redness and visual disturbance.  Respiratory: Negative for chest tightness and shortness of breath.   Cardiovascular: Negative for chest  pain and leg swelling.  Genitourinary: Negative for difficulty urinating and dysuria.  Musculoskeletal: Positive for back pain. Negative for gait problem.  Skin: Negative for rash.  Neurological: Negative for light-headedness and headaches.  Psychiatric/Behavioral: Positive for sleep disturbance. Negative for agitation and behavioral problems.  All other systems reviewed and are negative.   Per HPI unless specifically indicated above   Allergies as of 07/15/2017      Reactions   Codeine Nausea And Vomiting   Sulfa Antibiotics       Medication List        Accurate as of 07/15/17 10:00 AM. Always use your most recent med list.          ALPRAZolam 0.25 MG tablet Commonly known as:  XANAX Take 1 tablet (0.25 mg total) by mouth at bedtime.   amLODipine 5 MG tablet Commonly known as:  NORVASC TAKE 1 TABLET EVERY DAY   baclofen 10 MG tablet Commonly known as:  LIORESAL Take 1 tablet (10 mg total) by mouth at bedtime as needed for muscle spasms.   DULoxetine 30 MG capsule Commonly known as:  CYMBALTA Take 1 capsule (30 mg total) by mouth daily.   enalapril 20 MG tablet Commonly known as:  VASOTEC TAKE 1 AND 1/2 TABLETS EVERY DAY   Fish Oil 1000 MG Caps Take 2,000 mg by mouth.   fluticasone 50 MCG/ACT nasal spray Commonly known as:  FLONASE USE 2 SPRAYS IN EACH NOSTRIL EVERY DAY   hydrochlorothiazide 25 MG tablet Commonly known as:  HYDRODIURIL  TAKE 1 TABLET EVERY DAY   hyoscyamine 0.125 MG Tbdp disintergrating tablet Commonly known as:  ANASPAZ Take 1 tablet (0.125 mg total) by mouth every 6 (six) hours as needed.   loratadine 10 MG tablet Commonly known as:  CLARITIN Take 10 mg by mouth daily.   meloxicam 15 MG tablet Commonly known as:  MOBIC TAKE 1 TABLET EVERY DAY   ondansetron 4 MG disintegrating tablet Commonly known as:  ZOFRAN ODT Take 1 tablet (4 mg total) by mouth every 8 (eight) hours as needed for nausea or vomiting.   pantoprazole 40 MG  tablet Commonly known as:  PROTONIX Take 1 tablet (40 mg total) by mouth daily.   PROBIOTIC & ACIDOPHILUS EX ST PO Take by mouth.   traMADol 50 MG tablet Commonly known as:  ULTRAM Take 1 tablet (50 mg total) by mouth 3 (three) times daily as needed.   VITAMIN D-1000 MAX ST 1000 units tablet Generic drug:  Cholecalciferol Take by mouth.          Objective:    BP 136/79   Pulse 68   Temp 98.6 F (37 C) (Oral)   Ht 5\' 1"  (1.549 m)   Wt 145 lb (65.8 kg)   BMI 27.40 kg/m   Wt Readings from Last 3 Encounters:  07/15/17 145 lb (65.8 kg)  07/01/17 148 lb (67.1 kg)  06/02/17 148 lb (67.1 kg)    Physical Exam  Constitutional: She is oriented to person, place, and time. She appears well-developed and well-nourished. No distress.  Eyes: Pupils are equal, round, and reactive to light. Conjunctivae and EOM are normal.  Neck: Neck supple. No thyromegaly present.  Cardiovascular: Normal rate, regular rhythm, normal heart sounds and intact distal pulses.  No murmur heard. Pulmonary/Chest: Effort normal and breath sounds normal. No respiratory distress. She has no wheezes.  Musculoskeletal: Normal range of motion. She exhibits tenderness (Mild low back tenderness, controlled on medication). She exhibits no edema.  Lymphadenopathy:    She has no cervical adenopathy.  Neurological: She is alert and oriented to person, place, and time. Coordination normal.  Skin: Skin is warm and dry. No rash noted. She is not diaphoretic.  Psychiatric: She has a normal mood and affect. Her behavior is normal.  Nursing note and vitals reviewed.       Assessment & Plan:   Problem List Items Addressed This Visit      Cardiovascular and Mediastinum   Hypertension - Primary (Chronic)     Musculoskeletal and Integument   Degenerative disc disease, lumbar (Chronic)     Other   Hyperlipemia (Chronic)   BMI 27.0-27.9,adult   Chronic low back pain   Relevant Medications   DULoxetine (CYMBALTA)  30 MG capsule   Pain management contract agreement   Relevant Medications   DULoxetine (CYMBALTA) 30 MG capsule    Other Visit Diagnoses    Primary insomnia       Relevant Medications   ALPRAZolam (XANAX) 0.25 MG tablet   DULoxetine (CYMBALTA) 30 MG capsule       Follow up plan: Return in about 3 months (around 10/15/2017), or if symptoms worsen or fail to improve, for Recheck hypertension and chronic pain.  Counseling provided for all of the vaccine components No orders of the defined types were placed in this encounter.   Caryl Pina, MD [facility] 07/15/2017, 10:00 AM

## 2017-07-16 ENCOUNTER — Telehealth: Payer: Self-pay | Admitting: Family Medicine

## 2017-07-16 DIAGNOSIS — G8929 Other chronic pain: Secondary | ICD-10-CM

## 2017-07-16 DIAGNOSIS — M545 Low back pain: Principal | ICD-10-CM

## 2017-07-16 DIAGNOSIS — M5136 Other intervertebral disc degeneration, lumbar region: Secondary | ICD-10-CM

## 2017-07-16 DIAGNOSIS — M51369 Other intervertebral disc degeneration, lumbar region without mention of lumbar back pain or lower extremity pain: Secondary | ICD-10-CM

## 2017-07-16 DIAGNOSIS — Z0289 Encounter for other administrative examinations: Secondary | ICD-10-CM

## 2017-07-16 MED ORDER — TRAMADOL HCL 50 MG PO TABS: 50.0000 mg | ORAL_TABLET | Freq: Three times a day (TID) | ORAL | 0 refills | Status: DC | PRN

## 2017-08-18 ENCOUNTER — Encounter: Payer: Self-pay | Admitting: Family

## 2017-08-18 ENCOUNTER — Ambulatory Visit (INDEPENDENT_AMBULATORY_CARE_PROVIDER_SITE_OTHER): Payer: Medicare HMO | Admitting: Family

## 2017-08-18 VITALS — BP 122/69 | HR 98 | Temp 97.1°F | Ht 61.0 in | Wt 147.4 lb

## 2017-08-18 DIAGNOSIS — S76911A Strain of unspecified muscles, fascia and tendons at thigh level, right thigh, initial encounter: Secondary | ICD-10-CM

## 2017-08-18 MED ORDER — PREDNISONE 10 MG (21) PO TBPK: ORAL_TABLET | ORAL | 0 refills | Status: DC

## 2017-08-18 NOTE — Patient Instructions (Signed)
Quadriceps Strain A quadriceps strain is an injury to the muscles or tendons on the front of the thigh. The quadriceps muscles are used in straightening the knee and bending the hip. A strain occurs when the muscle is overstretched or overloaded. There are three types of strains:  Grade 1 is a mild strain. It involves a stretching or minor tearing of your muscle fibers or tendons. You should have little, if any, trouble using your thigh.  Grade 2 is a moderate strain. It involves a partial tearing of your muscle fibers or tendons. You will have pain and some loss of strength in your thigh.  Grade 3 is a severe strain. It involves a complete tearing of your muscle fibers or tendon. It causes severe pain and loss of strength in your thigh.  Recovery will take a few weeks or longer, depending on how bad your strain is. What are the causes? This injury is caused by overextending the muscles in the thigh. What increases the risk? The following factors may make you more likely to develop this injury:  Participating in: ? Activities that involve jumping or sprinting. ? Contact sports, such as football or soccer.  Having a previous injury to your thigh or knee.  Having poor strength and flexibility.  Not warming up properly before activity.  Having one leg that is much stronger than the other.  Exercising to the point of exhaustion.  What are the signs or symptoms? Symptoms of this condition include:  Sudden, severe pain in your thigh.  Pain and tenderness over your quadriceps muscles. The pain gets worse when you use these muscles.  Muscle spasm in your thigh.  Bruising.  Having trouble with tasks that involve using your quadriceps muscle, such as walking.  A crackling sound when the tendon is moved or touched.  How is this diagnosed? This condition may be diagnosed based on your symptoms, your medical history, and a physical exam. Your health care provider may order tests such  as an X-ray, ultrasound, or MRI to further evaluate your injury and to rule out other conditions. How is this treated? Treatment for this condition may include:  Resting your leg and avoiding activities that cause pain.  Taking medicine to help reduce pain and inflammation.  Applying ice to the area to relieve swelling and inflammation.  Using crutches until you can walk without pain.  Working with a physical therapist on exercises to restore strength and flexibility in your thigh.  In rare cases, surgery may be needed. Follow these instructions at home: Managing pain, stiffness, and swelling  If directed, put ice on the injured area: ? Put ice in a plastic bag. ? Place a towel between your skin and the bag. ? Leave the ice on for 20 minutes, 2-3 times a day.  Raise (elevate) the injured area above the level of your heart while you are sitting or lying down. Activity  Do not use the injured limb to support your body weight until your health care provider says that you can. Use crutches as told by your health care provider.  Do exercises as told by your health care provider.  Return to your normal activities as told by your health care provider. Ask your health care provider what activities are safe for you. General instructions  Take over-the-counter and prescription medicines only as told by your health care provider.  Keep all follow-up visits as told by your health care provider. This is important. How is this prevented?  Warm up and stretch before being active.  Cool down and stretch after being active.  Give your body time to rest between periods of activity.  Make sure to use equipment that fits you.  Be safe and responsible while being active to avoid falls.  Do at least 150 minutes of moderate-intensity exercise each week, such as brisk walking or water aerobics.  Maintain physical fitness, including: ? Strength ? Flexibility. ? Cardiovascular  fitness. ? Endurance. Contact a health care provider if:  Your pain, bruising, or tenderness gets worse, even with treatment.  Your leg becomes weaker. This information is not intended to replace advice given to you by your health care provider. Make sure you discuss any questions you have with your health care provider. Document Released: 12/21/2004 Document Revised: 08/26/2015 Document Reviewed: 09/24/2014 Elsevier Interactive Patient Education  [employer].

## 2017-08-18 NOTE — Progress Notes (Signed)
   Subjective:    Patient ID: Maria Moss, female    DOB: Jun 17, 1950, 67 y.o.   MRN: [id number]  Chief Complaint  Patient presents with  . pulled muscle in right leg    HPI PT presents to the office today with right thigh pain that started last week. She states about a week ago she was getting out of the bath and she states she had a hard time getting out. She believes she "pulled a muscle".  She reports intermittent aching pain 10 out 10. She states the pain is worse when she tries to walk on her leg after sitting or laying down.   She has taken Ultram, mobic, and Hemp cream that has slightly helped.     Review of Systems  Musculoskeletal: Positive for myalgias.  All other systems reviewed and are negative.      Objective:   Physical Exam  Constitutional: She is oriented to person, place, and time. She appears well-developed and well-nourished. No distress.  HENT:  Head: Normocephalic.  Eyes: Pupils are equal, round, and reactive to light.  Neck: Normal range of motion. Neck supple. No thyromegaly present.  Cardiovascular: Normal rate, regular rhythm, normal heart sounds and intact distal pulses.  No murmur heard. Pulmonary/Chest: Effort normal and breath sounds normal. No respiratory distress. She has no wheezes.  Abdominal: Soft. Bowel sounds are normal. She exhibits no distension. There is no tenderness.  Musculoskeletal: Normal range of motion. She exhibits tenderness (mild medial and lateral pain with flexion, full ROM.) and deformity (right leg and foot pointing outward, has been like this since birth). She exhibits no edema.  Neurological: She is alert and oriented to person, place, and time. She has normal reflexes. No cranial nerve deficit.  Skin: Skin is warm and dry.  Psychiatric: She has a normal mood and affect. Her behavior is normal. Judgment and thought content normal.  Vitals reviewed.     BP 122/69   Pulse 98   Temp (!) 97.1 F (36.2 C) (Oral)   Ht 5\' 1"   (1.549 m)   Wt 147 lb 6.4 oz (66.9 kg)   BMI 27.85 kg/m      Assessment & Plan:  Maria Moss comes in today with chief complaint of pulled muscle in right leg   Diagnosis and orders addressed:  1. Muscle strain of right thigh, initial encounter Rest Ice ROM exercises discussed Continue Mobic and Ultram as needed RTO if pain does not improve or worsens - predniSONE (STERAPRED UNI-PAK 21 TAB) 10 MG (21) TBPK tablet; Use as directed  Dispense: 21 tablet; Refill: 0   [name], FNP

## 2017-09-01 ENCOUNTER — Telehealth: Payer: Self-pay | Admitting: Family Medicine

## 2017-09-01 NOTE — Telephone Encounter (Signed)
Pt wants Jan to call her on her cell phone about the message in from earlier

## 2017-09-01 NOTE — Telephone Encounter (Signed)
Pt still taking mobic? If so she needs to be seen.

## 2017-09-01 NOTE — Telephone Encounter (Signed)
Pt states she takes the Meloxicam daily. Appt scheduled with Dr Warrick Parisian 8/30 at 11:25.

## 2017-09-01 NOTE — Telephone Encounter (Signed)
Did the prednisone help? Is the pain worse? If so she may need to be seen or  She may need PT.

## 2017-09-01 NOTE — Telephone Encounter (Signed)
Patient calling to let us know she will be out of the house running errands and we can call her on cell phone if needed

## 2017-09-01 NOTE — Telephone Encounter (Signed)
The pain seemed to get better for a couple of days but then came back, now going into her hip.

## 2017-09-02 ENCOUNTER — Ambulatory Visit (INDEPENDENT_AMBULATORY_CARE_PROVIDER_SITE_OTHER): Payer: Medicare HMO | Admitting: Family Medicine

## 2017-09-02 ENCOUNTER — Encounter: Payer: Self-pay | Admitting: Family Medicine

## 2017-09-02 VITALS — BP 135/85 | HR 99 | Temp 97.7°F | Ht 61.0 in | Wt 150.0 lb

## 2017-09-02 DIAGNOSIS — G8929 Other chronic pain: Secondary | ICD-10-CM | POA: Diagnosis not present

## 2017-09-02 DIAGNOSIS — Z0289 Encounter for other administrative examinations: Secondary | ICD-10-CM | POA: Diagnosis not present

## 2017-09-02 DIAGNOSIS — M545 Low back pain: Secondary | ICD-10-CM | POA: Diagnosis not present

## 2017-09-02 DIAGNOSIS — S76911A Strain of unspecified muscles, fascia and tendons at thigh level, right thigh, initial encounter: Secondary | ICD-10-CM | POA: Diagnosis not present

## 2017-09-02 DIAGNOSIS — M5136 Other intervertebral disc degeneration, lumbar region: Secondary | ICD-10-CM | POA: Diagnosis not present

## 2017-09-02 MED ORDER — TRAMADOL HCL 50 MG PO TABS: 50.0000 mg | ORAL_TABLET | Freq: Three times a day (TID) | ORAL | 1 refills | Status: DC | PRN

## 2017-09-02 NOTE — Progress Notes (Signed)
BP 135/85   Pulse 99   Temp 97.7 F (36.5 C) (Oral)   Ht 5\' 1"  (1.549 m)   Wt 150 lb (68 kg)   BMI 28.34 kg/m    Subjective:    Patient ID: Maria Moss, female    DOB: 1950/10/01, 67 y.o.   MRN: [id number]  HPI: Maria Moss is a 67 y.o. female presenting on 09/02/2017 for muscle strain (Right thigh- patient was seen 8/15 and states it is no better)   HPI Right thigh pain/muscle pull Patient comes in today complaining of right thigh pain/muscle pull that she sustained a couple weeks ago when she is trying out her bathtub and her foot got caught on the edge of it and it pulled behind her.  She feels like she pulled her muscle at that time and then came in to see 1 of my colleagues and was given some prednisone and some more tramadol.  She denies any numbness or weakness but she just feels unstable because of the pain in that hamstring and quad.  She feels like it comes a little bit up on the side of her leg as well.  She was using a cane but she does not have that today.  She does feel somewhat unstable because of this.  Relevant past medical, surgical, family and social history reviewed and updated as indicated. Interim medical history since our last visit reviewed. Allergies and medications reviewed and updated.  Review of Systems  Constitutional: Negative for chills and fever.  Respiratory: Negative for chest tightness and shortness of breath.   Cardiovascular: Negative for chest pain and leg swelling.  Musculoskeletal: Positive for arthralgias, gait problem and myalgias.  Skin: Negative for rash.  All other systems reviewed and are negative.   Per HPI unless specifically indicated above   Allergies as of 09/02/2017      Reactions   Codeine Nausea And Vomiting   Sulfa Antibiotics       Medication List        Accurate as of 09/02/17 11:52 AM. Always use your most recent med list.          ALPRAZolam 0.25 MG tablet Commonly known as:  XANAX Take 1 tablet (0.25 mg total)  by mouth at bedtime.   amLODipine 5 MG tablet Commonly known as:  NORVASC TAKE 1 TABLET EVERY DAY   baclofen 10 MG tablet Commonly known as:  LIORESAL Take 1 tablet (10 mg total) by mouth at bedtime as needed for muscle spasms.   DULoxetine 30 MG capsule Commonly known as:  CYMBALTA Take 1 capsule (30 mg total) by mouth daily.   enalapril 20 MG tablet Commonly known as:  VASOTEC TAKE 1 AND 1/2 TABLETS EVERY DAY   Fish Oil 1000 MG Caps Take 2,000 mg by mouth.   fluticasone 50 MCG/ACT nasal spray Commonly known as:  FLONASE USE 2 SPRAYS IN EACH NOSTRIL EVERY DAY   hydrochlorothiazide 25 MG tablet Commonly known as:  HYDRODIURIL TAKE 1 TABLET EVERY DAY   hyoscyamine 0.125 MG Tbdp disintergrating tablet Commonly known as:  ANASPAZ Take 1 tablet (0.125 mg total) by mouth every 6 (six) hours as needed.   loratadine 10 MG tablet Commonly known as:  CLARITIN Take 10 mg by mouth daily.   meloxicam 15 MG tablet Commonly known as:  MOBIC TAKE 1 TABLET EVERY DAY   ondansetron 4 MG disintegrating tablet Commonly known as:  ZOFRAN-ODT Take 1 tablet (4 mg total) by mouth every  8 (eight) hours as needed for nausea or vomiting.   pantoprazole 40 MG tablet Commonly known as:  PROTONIX Take 1 tablet (40 mg total) by mouth daily.   predniSONE 10 MG (21) Tbpk tablet Commonly known as:  STERAPRED UNI-PAK 21 TAB Use as directed   PROBIOTIC & ACIDOPHILUS EX ST PO Take by mouth.   traMADol 50 MG tablet Commonly known as:  ULTRAM Take 1 tablet (50 mg total) by mouth 3 (three) times daily as needed.   VITAMIN D-1000 MAX ST 1000 units tablet Generic drug:  Cholecalciferol Take by mouth.          Objective:    BP 135/85   Pulse 99   Temp 97.7 F (36.5 C) (Oral)   Ht 5\' 1"  (1.549 m)   Wt 150 lb (68 kg)   BMI 28.34 kg/m   Wt Readings from Last 3 Encounters:  09/02/17 150 lb (68 kg)  08/18/17 147 lb 6.4 oz (66.9 kg)  07/15/17 145 lb (65.8 kg)    Physical Exam    Constitutional: She is oriented to person, place, and time. She appears well-developed and well-nourished. No distress.  Eyes: Conjunctivae are normal.  Musculoskeletal: Normal range of motion. She exhibits no edema.       Right upper leg: She exhibits tenderness (Tenderness over quad and hamstring, worse over quad, tenderness with range of motion of hip but no pain in hip itself). She exhibits no bony tenderness, no swelling and no deformity.  Neurological: She is alert and oriented to person, place, and time. Coordination normal.  Skin: Skin is warm and dry. She is not diaphoretic.  Nursing note and vitals reviewed.       Assessment & Plan:   Problem List Items Addressed This Visit      Musculoskeletal and Integument   Degenerative disc disease, lumbar (Chronic)   Relevant Medications   traMADol (ULTRAM) 50 MG tablet     Other   Chronic low back pain   Relevant Medications   traMADol (ULTRAM) 50 MG tablet   Pain management contract agreement   Relevant Medications   traMADol (ULTRAM) 50 MG tablet    Other Visit Diagnoses    Muscle strain of right thigh, initial encounter    -  Primary   Relevant Orders   Ambulatory referral to Physical Therapy      Very tried medications and patient is Artie taken a muscle relaxer and some anti-inflammatories, recommended for her to go to physical therapy. Follow up plan: Return if symptoms worsen or fail to improve.  Counseling provided for all of the vaccine components Orders Placed This Encounter  Procedures  . Ambulatory referral to Physical Therapy    Caryl Pina, MD [facility] 09/02/2017, 11:52 AM

## 2017-09-13 ENCOUNTER — Other Ambulatory Visit: Payer: Self-pay

## 2017-09-13 ENCOUNTER — Other Ambulatory Visit: Payer: Self-pay | Admitting: Family Medicine

## 2017-09-13 ENCOUNTER — Ambulatory Visit: Payer: Medicare HMO | Attending: Family Medicine | Admitting: Physical Therapy

## 2017-09-13 ENCOUNTER — Encounter: Payer: Self-pay | Admitting: Physical Therapy

## 2017-09-13 DIAGNOSIS — M545 Low back pain: Principal | ICD-10-CM

## 2017-09-13 DIAGNOSIS — M25551 Pain in right hip: Secondary | ICD-10-CM | POA: Diagnosis not present

## 2017-09-13 DIAGNOSIS — F5101 Primary insomnia: Secondary | ICD-10-CM

## 2017-09-13 DIAGNOSIS — Z0289 Encounter for other administrative examinations: Secondary | ICD-10-CM

## 2017-09-13 DIAGNOSIS — M79651 Pain in right thigh: Secondary | ICD-10-CM | POA: Diagnosis not present

## 2017-09-13 DIAGNOSIS — G8929 Other chronic pain: Secondary | ICD-10-CM

## 2017-09-13 NOTE — Therapy (Signed)
[facility] [place], [place], [address] Phone: [phone]   Fax:  [phone]  Physical Therapy Evaluation  Patient Details  Name: Maria Moss MRN: [id number] Date of Birth: June 10, 1950 Referring Provider: Vonna Kotyk Dettinger MD   Encounter Date: 09/13/2017  PT End of Session - 09/13/17 1043    Visit Number  1    Number of Visits  12    Date for PT Re-Evaluation  10/25/17    PT Start Time  0945    PT Stop Time  1035    PT Time Calculation (min)  50 min    Activity Tolerance  Patient tolerated treatment well    Behavior During Therapy  [facility] for tasks assessed/performed       Past Medical History:  Diagnosis Date  . Ankle fracture, left 2006  . Diverticula, intestine    Mild case.  Marland Kitchen Hyperlipidemia   . Hypertension     Past Surgical History:  Procedure Laterality Date  . ANKLE SURGERY Left    Broken.  Has screws and metal plate.  . CHOLECYSTECTOMY      There were no vitals filed for this visit.   Subjective Assessment - 09/13/17 1123    Subjective  The patient states that on 08/09/17 she was having trouble getting out of the tub and felt intense pain in her right thigh region.  The pain has been unrelenting for over a month now.  She reports increased pain after getting up after being at rest in which her pain can rise to a 10/10.    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    How long can you sit comfortably?  Transitory movements are extremely painful.    Patient Stated Goals  Get out of pain.    Currently in Pain?  Yes    Pain Score  8     Pain Location  Leg    Pain Orientation  Right    Pain Descriptors / Indicators  Aching;Throbbing    Pain Type  Acute pain    Pain Radiating Towards  Right LE.    Pain Onset  More than a month ago    Pain Frequency  Constant    Aggravating Factors   See above.    Pain Relieving Factors  See above.         [facility] PT Assessment - 09/13/17 0001       Assessment   Medical Diagnosis  Muscle strain of right thigh.    Referring Provider  Vonna Kotyk Dettinger MD    Onset Date/Surgical Date  08/09/17      Precautions   Precautions  None      Restrictions   Weight Bearing Restrictions  No      Balance Screen   Has the patient fallen in the past 6 months  No    Has the patient had a decrease in activity level because of a fear of falling?   No    Is the patient reluctant to leave their home because of a fear of falling?   No      Home Film/video editor residence      Prior Function   Level of Independence  Independent      Posture/Postural Control   Posture Comments  Left lateral trunk lean; right tibial ER.       ROM / Strength   AROM / PROM / Strength  AROM;Strength  AROM   Overall AROM Comments  WFL for right LE.      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  Patient reporting pain with resisted right LE motions.      Palpation   Palpation comment  Patient was found to be quite palpably tender over and around her right greater trochanter.      Special Tests   Other special tests  (+) right FABER test for right groin pain.      Ambulation/Gait   Gait Comments  Patient walks with a QC.  Her right LE is held in external rotation and she has a left trunk lean.                Objective measurements completed on examination: See above findings.      [facility] Adult PT Treatment/Exercise - 09/13/17 0001      Modalities   Modalities  Electrical Stimulation;Moist Heat      Moist Heat Therapy   Number Minutes Moist Heat  20 Minutes    Moist Heat Location  --   Right lateral hip/anterior thigh.     Acupuncturist Location  Right lateral hip/anterior thigh.    Electrical Stimulation Action  IFC    Electrical Stimulation Parameters  80-150 Hz x 20 minutes.     Electrical Stimulation Goals  Pain               PT Short Term Goals - 09/13/17 1231      PT SHORT TERM GOAL #1   Title  STG's=LTG's.        PT Long Term Goals - 09/13/17 1232      PT LONG TERM GOAL #1   Title  Independent with a HEP.    Time  6    Period  Weeks    Status  New      PT LONG TERM GOAL #2   Title  Patient perform transitory movements with pain not > 2-3/10.             Plan - 09/13/17 1221    Clinical Impression Statement  The patient presents to OPPT with c/o right thigh pain.  She injured herself while struggling to get out of the tub on 08/09/17.  She has a h/o oflow back pain but was not reporting significant pain complaints in this region.  She did have a positive right FABER test remarkable for right groin pain.  She was also tender to palpation over and around her right greater trochanter.  She has severe pain with transitory movements. Patient will benefit from skilled physical therapy intervention to address deficits.     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    Clinical Presentation  Evolving    Clinical Presentation due to:  Not improving.    Clinical Decision Making  Moderate    Rehab Potential  Good    PT Frequency  2x / week    PT Duration  6 weeks    PT Treatment/Interventions  ADLs/Self Care Home Management;Cryotherapy;Electrical Stimulation;Ultrasound;Moist Heat;Therapeutic activities;Therapeutic exercise;Patient/family education;Manual techniques    PT Next Visit Plan  Modalites to right hip and thigh region as well as STW/M.  Nustep; strengthening exercises as tolerated; Nustep as tolerated.    Consulted and Agree with Plan of Care  Patient       Patient will benefit from skilled therapeutic intervention in order to improve the following deficits and impairments:  Abnormal gait, Decreased activity tolerance, Decreased strength, Difficulty walking,  Pain  Visit Diagnosis: Pain in right thigh - Plan: PT plan of care cert/re-cert  Pain in right hip - Plan: PT plan of care cert/re-cert     Problem List Patient Active Problem List   Diagnosis Date Noted  . Trochanteric bursitis, left hip 06/01/2016  . Pain management contract agreement 11/28/2015  . Encounter for pain management counseling 04/11/2015  . Chronic low back pain 04/11/2015  . BMI 27.0-27.9,adult 11/29/2014  . Diverticulosis 10/24/2013  . Osteopenia 10/24/2013  . Degenerative disc disease, lumbar 07/21/2012  . Hyperlipemia 07/21/2012  . GAD (generalized anxiety disorder) 07/21/2012  . Hypertension 07/21/2012    Rhyanna Sorce, [place] MPT 09/13/2017, 12:37 PM  [facility] [address] [place], [place], [address] Phone: [phone]   Fax:  [phone]  Name: Maria Moss MRN: [id number] Date of Birth: Jun 01, 1950

## 2017-09-15 ENCOUNTER — Encounter: Payer: Self-pay | Admitting: Physical Therapy

## 2017-09-15 ENCOUNTER — Ambulatory Visit: Payer: Medicare HMO | Admitting: Physical Therapy

## 2017-09-15 DIAGNOSIS — M25551 Pain in right hip: Secondary | ICD-10-CM | POA: Diagnosis not present

## 2017-09-15 DIAGNOSIS — M79651 Pain in right thigh: Secondary | ICD-10-CM | POA: Diagnosis not present

## 2017-09-15 NOTE — Therapy (Signed)
[facility] [place], [place], [address] Phone: [phone]   Fax:  [phone]  Physical Therapy Treatment  Patient Details  Name: Maria Moss MRN: [id number] Date of Birth: [birth year] Referring Provider: Vonna Kotyk Dettinger MD   Encounter Date: 09/15/2017  PT End of Session - 09/15/17 0859    Visit Number  2    Number of Visits  12    Date for PT Re-Evaluation  10/25/17    PT Start Time  0903    PT Stop Time  0954    PT Time Calculation (min)  51 min    Activity Tolerance  Patient tolerated treatment well    Behavior During Therapy  [facility] for tasks assessed/performed       Past Medical History:  Diagnosis Date  . Ankle fracture, left 2006  . Diverticula, intestine    Mild case.  Marland Kitchen Hyperlipidemia   . Hypertension     Past Surgical History:  Procedure Laterality Date  . ANKLE SURGERY Left    Broken.  Has screws and metal plate.  . CHOLECYSTECTOMY      There were no vitals filed for this visit.  Subjective Assessment - 09/15/17 0858    Subjective  Reports that she is hurting some today especially in superior R quad and along greater trochanter region. More pain now with walking. Reported good tolerance following previous treatment with e stim treatment.    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    How long can you sit comfortably?  Transitory movements are extremely painful.    Patient Stated Goals  Get out of pain.    Currently in Pain?  Yes    Pain Score  7     Pain Location  Leg    Pain Orientation  Right;Anterior;Proximal;Lateral    Pain Descriptors / Indicators  Discomfort    Pain Type  Acute pain    Pain Onset  More than a month ago    Pain Frequency  Intermittent    Aggravating Factors   Sit to stand, RLE elevation    Pain Relieving Factors  Topical cream "anti freeze cream"         [facility] PT Assessment - 09/15/17 0001      Assessment   Medical Diagnosis  Muscle strain of right  thigh.    Onset Date/Surgical Date  08/09/17    Next MD Visit  None      Precautions   Precautions  None      Restrictions   Weight Bearing Restrictions  No                   [facility] Adult PT Treatment/Exercise - 09/15/17 0001      Exercises   Exercises  Knee/Hip      Knee/Hip Exercises: Aerobic   Nustep  L3, seat 6 x10 min      Knee/Hip Exercises: Seated   Long Arc Quad  AROM;Right;2 sets;10 reps    Marching  AROM;Right;1 set;10 reps   5/10 pain     Knee/Hip Exercises: Supine   Short Arc Quad Sets  AROM;Right;2 sets;10 reps    Heel Slides  AROM;Right;2 sets;10 reps    Hip Adduction Isometric  AROM;Both;2 sets;10 reps    Other Supine Knee/Hip Exercises  R HS set 5 sec hold x20 reps      Modalities   Modalities  Electrical Stimulation;Moist Heat      Moist Heat Therapy  Number Minutes Moist Heat  20 Minutes    Moist Heat Location  Hip      Electrical Stimulation   Electrical Stimulation Location  R proximal anterior and lateral thigh    Electrical Stimulation Action  Pre-Mod    Electrical Stimulation Parameters  80-150 hz x15 min    Electrical Stimulation Goals  Pain;Tone               PT Short Term Goals - 09/13/17 1231      PT SHORT TERM GOAL #1   Title  STG's=LTG's.        PT Long Term Goals - 09/13/17 1232      PT LONG TERM GOAL #1   Title  Independent with a HEP.    Time  6    Period  Weeks    Status  New      PT LONG TERM GOAL #2   Title  Patient perform transitory movements with pain not > 2-3/10.            Plan - 09/15/17 0939    Clinical Impression Statement  Patient arrived to clinic with reports of R thigh pain that is worse while walking and standing but also with weightbearing while getting in the car. Patient able to complete low level quad and hip strengthening with only intermittant discomfort with marching and SAQ. Patient experiencing referral pain down anterior and posterior R thigh with corresponding muscle  tightness in R quad and surrounding the R greater trochanter. Manual STW attempted but patient very tender to palpation thus discontinued. Increased tone palpated in R quad and ITB region. Normal modalities response noted following removal of the modalities. Transition movement from supine to sitting was very painful per patient due to hip abduction to move into sitting. Patient educated to allow PT more visits to assess symptoms and whether to return to MD.    Rehab Potential  Good    PT Frequency  2x / week    PT Duration  6 weeks    PT Treatment/Interventions  ADLs/Self Care Home Management;Cryotherapy;Electrical Stimulation;Ultrasound;Moist Heat;Therapeutic activities;Therapeutic exercise;Patient/family education;Manual techniques    PT Next Visit Plan  Modalites to right hip and thigh region as well as STW/M.  Nustep; strengthening exercises as tolerated; Nustep as tolerated.    Consulted and Agree with Plan of Care  Patient       Patient will benefit from skilled therapeutic intervention in order to improve the following deficits and impairments:  Abnormal gait, Decreased activity tolerance, Decreased strength, Difficulty walking, Pain  Visit Diagnosis: Pain in right thigh  Pain in right hip     Problem List Patient Active Problem List   Diagnosis Date Noted  . Trochanteric bursitis, left hip 06/01/2016  . Pain management contract agreement 11/28/2015  . Encounter for pain management counseling 04/11/2015  . Chronic low back pain 04/11/2015  . BMI 27.0-27.9,adult 11/29/2014  . Diverticulosis 10/24/2013  . Osteopenia 10/24/2013  . Degenerative disc disease, lumbar 07/21/2012  . Hyperlipemia 07/21/2012  . GAD (generalized anxiety disorder) 07/21/2012  . Hypertension 07/21/2012    Standley Brooking, PTA 09/15/2017, 10:02 AM  [facility] [address] [place], [place], [address] Phone: [phone]   Fax:  [phone]  Name: Maria Moss MRN: [id number] Date of Birth: [birth year]

## 2017-09-20 ENCOUNTER — Ambulatory Visit: Payer: Medicare HMO | Admitting: Physical Therapy

## 2017-09-20 DIAGNOSIS — M79651 Pain in right thigh: Secondary | ICD-10-CM

## 2017-09-20 DIAGNOSIS — M25551 Pain in right hip: Secondary | ICD-10-CM

## 2017-09-20 NOTE — Therapy (Signed)
[facility] [place], [place], [address] Phone: [phone]   Fax:  [phone]  Physical Therapy Treatment  Patient Details  Name: Maria Moss MRN: [id number] Date of Birth: [birth year] Referring Provider: Vonna Kotyk Dettinger MD   Encounter Date: 09/20/2017  PT End of Session - 09/20/17 0948    Visit Number  3    Number of Visits  12    Date for PT Re-Evaluation  10/25/17    PT Start Time  0946    PT Stop Time  1037    PT Time Calculation (min)  51 min    Activity Tolerance  Patient tolerated treatment well    Behavior During Therapy  [facility] for tasks assessed/performed       Past Medical History:  Diagnosis Date  . Ankle fracture, left 2006  . Diverticula, intestine    Mild case.  Marland Kitchen Hyperlipidemia   . Hypertension     Past Surgical History:  Procedure Laterality Date  . ANKLE SURGERY Left    Broken.  Has screws and metal plate.  . CHOLECYSTECTOMY      There were no vitals filed for this visit.  Subjective Assessment - 09/20/17 0947    Subjective  Patient reports she feels better and pain is not as bad as it was.    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    How long can you sit comfortably?  Transitory movements are extremely painful.    Patient Stated Goals  Get out of pain.    Currently in Pain?  Yes    Pain Score  5     Pain Location  Leg    Pain Orientation  Right;Anterior;Proximal;Lateral;Medial    Pain Descriptors / Indicators  Discomfort    Pain Type  Acute pain    Pain Onset  More than a month ago         [facility] PT Assessment - 09/20/17 0001      Assessment   Medical Diagnosis  Muscle strain of right thigh.    Onset Date/Surgical Date  08/09/17    Next MD Visit  None      Special Tests   Other special tests  (-) femur fulcurm test                   [facility] Adult PT Treatment/Exercise - 09/20/17 0001      Exercises   Exercises  Knee/Hip      Knee/Hip  Exercises: Aerobic   Nustep  L3, seat 6 x10 min      Knee/Hip Exercises: Seated   Long Arc Quad  AROM;Right;2 sets;10 reps      Knee/Hip Exercises: Supine   Quad Sets  AROM;Strengthening;Right;20 reps;Other (comment)   5" hold   Heel Slides  AROM;Right;2 sets;10 reps    Hip Adduction Isometric  AROM;Both;2 sets;10 reps   5" hold   Other Supine Knee/Hip Exercises  clam shells 5" hold x20    Other Supine Knee/Hip Exercises  supine marching R x10, pain in hamstrings      Modalities   Modalities  Electrical Stimulation;Moist Heat      Moist Heat Therapy   Number Minutes Moist Heat  15 Minutes    Moist Heat Location  Hip      Electrical Stimulation   Electrical Stimulation Location  Lateral hip to anterior thigh    Electrical Stimulation Action  IFC    Electrical Stimulation Parameters  80-150 hz  x15 mi    Electrical Stimulation Goals  Pain;Tone               PT Short Term Goals - 09/13/17 1231      PT SHORT TERM GOAL #1   Title  STG's=LTG's.        PT Long Term Goals - 09/13/17 1232      PT LONG TERM GOAL #1   Title  Independent with a HEP.    Time  6    Period  Weeks    Status  New      PT LONG TERM GOAL #2   Title  Patient perform transitory movements with pain not > 2-3/10.            Plan - 09/20/17 1026    Clinical Impression Statement  Patient was able to tolerate treatment well but did report increase of posterior thigh pain with supine marching. Patient was able to perform 10 reps with posterior thigh pain and reported increase of anterior thigh pain as she continued. Patient denied increase of pain with other exercises. Patient (-) with femur fulcrum test for fracture; patient noted with some discomfort but did not illicit apprehension or report sharp pain to make the test positive. Patient educated to continue to assess symptoms and continue PT to determine whether to return to MD for an X-Ray. Patient reported understanding. Normal response to  modalities at end of session.     Clinical Presentation  Evolving    Clinical Decision Making  Moderate    Rehab Potential  Good    PT Frequency  2x / week    PT Duration  6 weeks    PT Treatment/Interventions  ADLs/Self Care Home Management;Cryotherapy;Electrical Stimulation;Ultrasound;Moist Heat;Therapeutic activities;Therapeutic exercise;Patient/family education;Manual techniques    PT Next Visit Plan  Modalites to right hip and thigh region as well as STW/M.  Nustep; strengthening exercises as tolerated; Nustep as tolerated.    Consulted and Agree with Plan of Care  Patient       Patient will benefit from skilled therapeutic intervention in order to improve the following deficits and impairments:  Abnormal gait, Decreased activity tolerance, Decreased strength, Difficulty walking, Pain  Visit Diagnosis: Pain in right thigh  Pain in right hip     Problem List Patient Active Problem List   Diagnosis Date Noted  . Trochanteric bursitis, left hip 06/01/2016  . Pain management contract agreement 11/28/2015  . Encounter for pain management counseling 04/11/2015  . Chronic low back pain 04/11/2015  . BMI 27.0-27.9,adult 11/29/2014  . Diverticulosis 10/24/2013  . Osteopenia 10/24/2013  . Degenerative disc disease, lumbar 07/21/2012  . Hyperlipemia 07/21/2012  . GAD (generalized anxiety disorder) 07/21/2012  . Hypertension 07/21/2012   Gabriela Eves, PT, DPT 09/20/2017, 3:44 PM  [facility] [place], [place], [address] Phone: [phone]   Fax:  [phone]  Name: Maria Moss MRN: [id number] Date of Birth: [birth year]

## 2017-09-22 ENCOUNTER — Encounter: Payer: Self-pay | Admitting: Physical Therapy

## 2017-09-22 ENCOUNTER — Other Ambulatory Visit: Payer: Self-pay

## 2017-09-22 ENCOUNTER — Ambulatory Visit: Payer: Medicare HMO | Admitting: Physical Therapy

## 2017-09-22 ENCOUNTER — Other Ambulatory Visit (INDEPENDENT_AMBULATORY_CARE_PROVIDER_SITE_OTHER): Payer: Medicare HMO

## 2017-09-22 DIAGNOSIS — M25551 Pain in right hip: Secondary | ICD-10-CM

## 2017-09-22 DIAGNOSIS — M79651 Pain in right thigh: Secondary | ICD-10-CM | POA: Diagnosis not present

## 2017-09-22 NOTE — Therapy (Addendum)
[facility] [place], [place], [address] Phone: [phone]   Fax:  [phone]  Physical Therapy Treatment  Patient Details  Name: Maria Moss MRN: [id number] Date of Birth: Feb 16, 1950 Referring Provider: Vonna Kotyk Dettinger MD   Encounter Date: 09/22/2017  PT End of Session - 09/22/17 0951    Visit Number  4    Number of Visits  12    Date for PT Re-Evaluation  10/25/17    PT Start Time  0947    PT Stop Time  1027   2 units secondary to pain with therex   PT Time Calculation (min)  40 min    Activity Tolerance  Patient limited by pain    Behavior During Therapy  [facility] for tasks assessed/performed       Past Medical History:  Diagnosis Date  . Ankle fracture, left 2006  . Diverticula, intestine    Mild case.  Marland Kitchen Hyperlipidemia   . Hypertension     Past Surgical History:  Procedure Laterality Date  . ANKLE SURGERY Left    Broken.  Has screws and metal plate.  . CHOLECYSTECTOMY      There were no vitals filed for this visit.  Subjective Assessment - 09/22/17 0947    Subjective  Reports that her pain in the R hip is back. Reports that upon waking she could hardly stand to walk on her RLE due to pain.     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    How long can you sit comfortably?  Transitory movements are extremely painful.    Patient Stated Goals  Get out of pain.    Currently in Pain?  Yes    Pain Score  8     Pain Location  Hip    Pain Orientation  Right;Lateral    Pain Descriptors / Indicators  Sharp    Pain Type  Acute pain    Pain Radiating Towards  R lateral thigh    Pain Onset  More than a month ago    Pain Frequency  Intermittent    Aggravating Factors   Movement, walking         [facility] PT Assessment - 09/22/17 0001      Assessment   Medical Diagnosis  Muscle strain of right thigh.    Onset Date/Surgical Date  08/09/17    Next MD Visit  None      Precautions   Precautions   None      Restrictions   Weight Bearing Restrictions  No      Palpation   Palpation comment  Patient very tender over the R greater trochanter, proximal rectus femoris attachment, just posterior greater trochanter. Minimal discomfort down R ITB                   [facility] Adult PT Treatment/Exercise - 09/22/17 0001      Exercises   Exercises  Knee/Hip      Knee/Hip Exercises: Aerobic   Nustep  L4 x10 min      Knee/Hip Exercises: Supine   Short Arc Quad Sets  AROM;Right;2 sets;10 reps    Heel Slides  AROM;Right;2 sets;10 reps    Hip Adduction Isometric  AROM;Both;20 reps    Other Supine Knee/Hip Exercises  AROM R clam x20 reps, R HS set x20 reps   less pain than supine marching   Other Supine Knee/Hip Exercises  Supine marching attempted but stopped secondary to  pain      Modalities   Modalities  Electrical Stimulation;Moist Heat      Moist Heat Therapy   Number Minutes Moist Heat  15 Minutes    Moist Heat Location  Hip      Electrical Stimulation   Electrical Stimulation Location  R lateral and anterior hip    Electrical Stimulation Action  Pre-mod    Electrical Stimulation Parameters  80-150 hz x15 min    Electrical Stimulation Goals  Pain;Tone               PT Short Term Goals - 09/13/17 1231      PT SHORT TERM GOAL #1   Title  STG's=LTG's.        PT Long Term Goals - 09/13/17 1232      PT LONG TERM GOAL #1   Title  Independent with a HEP.    Time  6    Period  Weeks    Status  New      PT LONG TERM GOAL #2   Title  Patient perform transitory movements with pain not > 2-3/10.            Plan - 09/22/17 1014    Clinical Impression Statement  Patient moderately limited with treatment today secondary to increased R hip pain. Radiation of pain more down lateral aspect of R hip per patient report. Transition movements such as getting into and out of the bed or the car as well as sit > stands are extremely painful for patient. Patient  also reported increased pain with ambulation as well. Patient experienced increased pain with supine marching today and less pain with R hip clam and HS set. Patient very tender to palpation along R greater trochanter and proximal rectus femoris attachment as well as some sensitivity posterior to R greater trochanter. Minimal soreness reported along R ITB. Patient highly encouraged to see PCP for R hip xray due to pain and patient having a high PT copay. Normal modalities response noted following removal of the modalities. Patient also provided handout regarding home TENS unit.    Rehab Potential  Good    PT Frequency  2x / week    PT Duration  6 weeks    PT Treatment/Interventions  ADLs/Self Care Home Management;Cryotherapy;Electrical Stimulation;Ultrasound;Moist Heat;Therapeutic activities;Therapeutic exercise;Patient/family education;Manual techniques    PT Next Visit Plan  Place on hold until xray and results obtained.    Consulted and Agree with Plan of Care  Patient       Patient will benefit from skilled therapeutic intervention in order to improve the following deficits and impairments:  Abnormal gait, Decreased activity tolerance, Decreased strength, Difficulty walking, Pain  Visit Diagnosis: Pain in right thigh  Pain in right hip     Problem List Patient Active Problem List   Diagnosis Date Noted  . Trochanteric bursitis, left hip 06/01/2016  . Pain management contract agreement 11/28/2015  . Encounter for pain management counseling 04/11/2015  . Chronic low back pain 04/11/2015  . BMI 27.0-27.9,adult 11/29/2014  . Diverticulosis 10/24/2013  . Osteopenia 10/24/2013  . Degenerative disc disease, lumbar 07/21/2012  . Hyperlipemia 07/21/2012  . GAD (generalized anxiety disorder) 07/21/2012  . Hypertension 07/21/2012    Standley Brooking, PTA 09/22/2017, 10:30 AM  [facility] [address] [place], [place], [address] Phone:  [phone]   Fax:  [phone]  Name: Maria Moss MRN: [id number] Date of Birth: [birth year]  PHYSICAL THERAPY DISCHARGE SUMMARY  Visits from Start of Care: 4.  Current functional level related to goals / functional outcomes: See above.   Remaining deficits: Continued pain.   Education / Equipment: HEP. Plan: Patient agrees to discharge.  Patient goals were not met. Patient is being discharged due to not returning since the last visit.  ?????         Mali Applegate MPT

## 2017-09-23 ENCOUNTER — Telehealth: Payer: Self-pay | Admitting: Family Medicine

## 2017-09-23 DIAGNOSIS — G8929 Other chronic pain: Secondary | ICD-10-CM

## 2017-09-23 DIAGNOSIS — M5136 Other intervertebral disc degeneration, lumbar region: Secondary | ICD-10-CM

## 2017-09-23 DIAGNOSIS — M545 Low back pain, unspecified: Secondary | ICD-10-CM

## 2017-09-23 DIAGNOSIS — M25551 Pain in right hip: Secondary | ICD-10-CM

## 2017-09-23 NOTE — Telephone Encounter (Signed)
[email or web address]

## 2017-09-23 NOTE — Telephone Encounter (Signed)
Covering PCP please review and advise  

## 2017-09-23 NOTE — Telephone Encounter (Signed)
Patient aware of results- wanting to know what else can be done for her right hip and thigh pain? States that it is too expensive to go to PT. Aware it will be Monday before Dr. Warrick Parisian gets back- patient verbalizes understanding.

## 2017-09-23 NOTE — Telephone Encounter (Signed)
Hip negative for fracture. Arthritis present.

## 2017-09-23 NOTE — Telephone Encounter (Signed)
Refer to previous phone note 

## 2017-09-25 NOTE — Telephone Encounter (Signed)
She could get a referral to orthopedic and they can possibly do an injection or she can take over-the-counter Tylenol arthritis which I know she has been doing, besides physical therapy and possible injection or even possibly discussing hip replacement with an orthopedic surgeon or the other options

## 2017-09-26 NOTE — Telephone Encounter (Signed)
Yes go ahead and do the referral to Dr. Inda Merlin, diagnosis right hip pain, lumbar pain and lumbar degeneration

## 2017-09-26 NOTE — Telephone Encounter (Signed)
LM for pt to call on result notes

## 2017-09-26 NOTE — Telephone Encounter (Signed)
She wants referral to Dr. Lorin Mercy for an injection

## 2017-09-26 NOTE — Telephone Encounter (Signed)
Referral placed.

## 2017-09-29 ENCOUNTER — Encounter (INDEPENDENT_AMBULATORY_CARE_PROVIDER_SITE_OTHER): Payer: Self-pay | Admitting: Orthopaedic Surgery

## 2017-09-29 ENCOUNTER — Ambulatory Visit (INDEPENDENT_AMBULATORY_CARE_PROVIDER_SITE_OTHER): Payer: Medicare HMO | Admitting: Orthopaedic Surgery

## 2017-09-29 VITALS — BP 136/94 | HR 89 | Ht 61.0 in | Wt 148.0 lb

## 2017-09-29 DIAGNOSIS — M5136 Other intervertebral disc degeneration, lumbar region: Secondary | ICD-10-CM

## 2017-09-29 DIAGNOSIS — M7061 Trochanteric bursitis, right hip: Secondary | ICD-10-CM

## 2017-09-29 DIAGNOSIS — M51369 Other intervertebral disc degeneration, lumbar region without mention of lumbar back pain or lower extremity pain: Secondary | ICD-10-CM

## 2017-09-29 NOTE — Progress Notes (Signed)
Office Visit Note/orthopedic consultation requested    Patient: Maria Moss           Date of Birth: 29-Mar-1950           MRN: [id number] Visit Date: 09/29/2017              Requested by: Dettinger, Fransisca Kaufmann, MD [place], [place] [address] PCP: Dettinger, Fransisca Kaufmann, MD   Assessment & Plan: Visit Diagnoses:  1. Trochanteric bursitis, right hip   2. Degenerative disc disease, lumbar     Plan: Trochanteric injection performed right hip.  She got improvement in her pain.  We discussed that her symptoms are likely coming from the lumbar spine with scoliosis and likely some lateral recess narrowing or foraminal narrowing.  I plan to recheck her in 3 weeks if she is having persistent problems will need to obtain AP lateral lumbar and oblique x-rays and consider MRI scan lumbar spine.  Thank you for the opportunity to see her in consultation.  Follow-Up Instructions: Return in about 3 weeks (around 10/20/2017).   Orders:  Orders Placed This Encounter  Procedures  . Large Joint Inj: R greater trochanter   No orders of the defined types were placed in this encounter.     Procedures: Large Joint Inj: R greater trochanter on 09/29/2017 3:44 PM Details: lateral approach Medications: 0.5 mL lidocaine 1 %; 40 mg methylPREDNISolone acetate 40 MG/ML; 2 mL bupivacaine 0.25 %      Clinical Data: No additional findings.   Subjective: Chief Complaint  Patient presents with  . Right Hip - Pain    HPI 67 year old female states she is trying to get out of the bathtub with her right leg turned out on 08/09/2017 and had immediate pain in the right anterior groin region and right lateral hip.  She states she had to sit down again.  She was on a deeper tub than normal and has had pain since that time some days better than others she has not noticed any weakness or numbness in her foot.  She is taken tramadol and Mobic for relief.  She had x-rays obtained which were on PACS able to be  reviewed.  She denies bowel or bladder symptoms she does have chronic pain in her back with associated scoliosis.  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  Past history of left ankle fracture without current problems with her ankle.  She always ambulates with a right foot and external rotation.  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  Patient is here for orthopedic consultation at the request of Dr.Dettinger. Pain currently is on the lateral aspect of the right hip. Review of Systems 14 point review of system positive for anxiety, hypertension, diverticulosis.  Osteopenia.  Scoliosis with lumbar degenerative disc disease, pain management on tramadol.  Otherwise negative as pertains HPI.   Objective: Vital Signs: BP (!) 136/94   Pulse 89   Ht 5\' 1"  (1.549 m)   Wt 148 lb (67.1 kg)   BMI 27.96 kg/m   Physical Exam  Constitutional: She is oriented to person, place, and time. She appears well-developed.  HENT:  Head: Normocephalic.  Right Ear: External ear normal.  Left Ear: External ear normal.  Eyes: Pupils are equal, round, and reactive to light.  Neck: No tracheal deviation present. No thyromegaly present.  Cardiovascular: Normal rate.  Pulmonary/Chest: Effort normal.  Abdominal: Soft.  Neurological: She is alert and oriented to person, place, and time.  Skin: Skin is warm and dry.  Psychiatric: She has a normal mood and affect. Her behavior is normal.    Ortho Exam patient has mild sciatic notch tenderness.  Scoliosis is noted with minimal pelvic obliquity.  She has gait with right foot and external rotation 45degrees which is unchanged.  Anterior tib gastrocsoleus is active.  No pain with internal/external rotation of the hip.  Mild tenderness palpation of the lumbar spine.  No right hip flexion weakness.  No weakness of internal or external rotation of the right hip quad is strong with resisted manual testing.   Negative popliteal compression test.  Distal pulses are palpable.  Specialty Comments:  No specialty comments available.  Imaging: CLINICAL DATA:  Right hip pain  EXAM: DG HIP (WITH OR WITHOUT PELVIS) 2-3V RIGHT  COMPARISON:  05/14/2016  FINDINGS: Negative for hip fracture or lesion. No hip joint narrowing or spurring. There is scoliosis and degenerative disc narrowing in the lumbar spine. Mild offset at the symphysis pubis with congruous bilateral sacroiliac joints, stable from prior.  IMPRESSION: 1. Negative right hip series. 2. Lumbar disc degeneration and scoliosis.   Electronically Signed   By: Monte Fantasia M.D.   On: 09/22/2017 15:57   PMFS History: Patient Active Problem List   Diagnosis Date Noted  . Trochanteric bursitis, left hip 06/01/2016  . Pain management contract agreement 11/28/2015  . Encounter for pain management counseling 04/11/2015  . Chronic low back pain 04/11/2015  . BMI 27.0-27.9,adult 11/29/2014  . Diverticulosis 10/24/2013  . Osteopenia 10/24/2013  . Degenerative disc disease, lumbar 07/21/2012  . Hyperlipemia 07/21/2012  . GAD (generalized anxiety disorder) 07/21/2012  . Hypertension 07/21/2012   Past Medical History:  Diagnosis Date  . Ankle fracture, left 2006  . Diverticula, intestine    Mild case.  Marland Kitchen Hyperlipidemia   . Hypertension     Family History  Problem Relation Age of Onset  . Heart disease Mother        CHF  . Cancer Mother        Uterine  . Osteoporosis Mother   . COPD Sister   . Asthma Sister   . Migraines Sister     Past Surgical History:  Procedure Laterality Date  . ANKLE SURGERY Left    Broken.  Has screws and metal plate.  . CHOLECYSTECTOMY     Social History   Occupational History  . Not on file  Tobacco Use  . Smoking status: Never Smoker  . Smokeless tobacco: Never Used  Substance and Sexual Activity  . Alcohol use: No  . Drug use: No  . Sexual activity: Not on file

## 2017-09-30 ENCOUNTER — Telehealth: Payer: Self-pay | Admitting: Family Medicine

## 2017-09-30 ENCOUNTER — Encounter (INDEPENDENT_AMBULATORY_CARE_PROVIDER_SITE_OTHER): Payer: Self-pay | Admitting: Orthopaedic Surgery

## 2017-09-30 MED ORDER — LIDOCAINE HCL 1 % IJ SOLN
0.5000 mL | INTRAMUSCULAR | Status: AC | PRN
  Administered 2017-09-29: .5 mL

## 2017-09-30 MED ORDER — METHYLPREDNISOLONE ACETATE 40 MG/ML IJ SUSP
40.0000 mg | INTRAMUSCULAR | Status: AC | PRN
  Administered 2017-09-29: 40 mg via INTRA_ARTICULAR

## 2017-09-30 MED ORDER — BUPIVACAINE HCL 0.25 % IJ SOLN
2.0000 mL | INTRAMUSCULAR | Status: AC | PRN
  Administered 2017-09-29: 2 mL via INTRA_ARTICULAR

## 2017-09-30 NOTE — Telephone Encounter (Signed)
Patient c/o of continued leg pain taking mobic and tramadol, with no relief. Please advise

## 2017-09-30 NOTE — Telephone Encounter (Signed)
Yes give a little bit more time for the injection to work and call him if not better on Monday

## 2017-09-30 NOTE — Telephone Encounter (Signed)
We have a referral in for Dr. Lorin Mercy, have her go see him and see what else can be done, I do not have any answers currently until we go see him.

## 2017-09-30 NOTE — Telephone Encounter (Signed)
Called patient she said she saw Dr Lorin Mercy yesterday and he did an injection hurting worse   She is going to call him Monday morning if not better

## 2017-10-01 NOTE — Telephone Encounter (Signed)
Patient aware of recommendation.  

## 2017-10-20 ENCOUNTER — Ambulatory Visit (INDEPENDENT_AMBULATORY_CARE_PROVIDER_SITE_OTHER): Payer: Medicare HMO

## 2017-10-20 ENCOUNTER — Ambulatory Visit (INDEPENDENT_AMBULATORY_CARE_PROVIDER_SITE_OTHER): Payer: Medicare HMO | Admitting: Orthopaedic Surgery

## 2017-10-20 ENCOUNTER — Encounter (INDEPENDENT_AMBULATORY_CARE_PROVIDER_SITE_OTHER): Payer: Self-pay | Admitting: Orthopaedic Surgery

## 2017-10-20 VITALS — BP 143/52 | HR 86 | Ht 61.0 in | Wt 148.0 lb

## 2017-10-20 DIAGNOSIS — M5136 Other intervertebral disc degeneration, lumbar region: Secondary | ICD-10-CM | POA: Diagnosis not present

## 2017-10-20 DIAGNOSIS — M51369 Other intervertebral disc degeneration, lumbar region without mention of lumbar back pain or lower extremity pain: Secondary | ICD-10-CM

## 2017-10-20 MED ORDER — DIAZEPAM 5 MG PO TABS: ORAL_TABLET | ORAL | 0 refills | Status: DC

## 2017-10-20 NOTE — Addendum Note (Signed)
Addended by: Meyer Cory on: 10/20/2017 12:08 PM   Modules accepted: Orders

## 2017-10-20 NOTE — Progress Notes (Signed)
Office Visit Note   Patient: Maria Moss           Date of Birth: [birth year]           MRN: [id number] Visit Date: 10/20/2017              Requested by: Dettinger, Fransisca Kaufmann, MD [place], [place] [address] PCP: Dettinger, Fransisca Kaufmann, MD   Assessment & Plan: Visit Diagnoses:  1. Degenerative disc disease, lumbar     Plan: Patient has failed therapy, anti-inflammatories, Mobic, trochanteric hip injection.  We will proceed with MRI scan to rule out lateral recess stenosis.  She has weakness in the right lower extremity but is difficult to discern if this is related with nerve compression or residual from her polio when she was a child.  She has an altered gait uses a cane short limb walks with her foot and external rotation 45 degrees.  This appears primarily to be through the tibia.  Will obtain a lumbar MRI office follow-up after scan for review.  Follow-Up Instructions: No follow-ups on file.   Orders:  Orders Placed This Encounter  Procedures  . XR Lumbar Spine Complete   No orders of the defined types were placed in this encounter.     Procedures: No procedures performed   Clinical Data: No additional findings.   Subjective: Chief Complaint  Patient presents with  . Lower Back - Pain, Follow-up  . Right Hip - Pain, Follow-up    HPI-year-old female returns post trochanteric injection she states she felt great for a day or 2 then had recurrence of symptoms her pain waxes and wanes.  She is noticed some increased weakness in her right lower extremity with this is side she is had involved with polio with external rotation shortening and some atrophy so it is difficult for determine if the weakness is progressed.  She does better with supine position.  Is been through therapy, tramadol, Mobic.  Lift in her shoe and is changed shoes.  She ambulates with a cane.  Review of Systems 14 point system update unchanged from 09/29/2017.   Objective: Vital Signs: BP (!)  143/52   Pulse 86   Ht 5\' 1"  (1.549 m)   Wt 148 lb (67.1 kg)   BMI 27.96 kg/m   Physical Exam  Constitutional: She is oriented to person, place, and time. She appears well-developed.  HENT:  Head: Normocephalic.  Right Ear: External ear normal.  Left Ear: External ear normal.  Eyes: Pupils are equal, round, and reactive to light.  Neck: No tracheal deviation present. No thyromegaly present.  Cardiovascular: Normal rate.  Pulmonary/Chest: Effort normal.  Abdominal: Soft.  Neurological: She is alert and oriented to person, place, and time.  Skin: Skin is warm and dry.  Psychiatric: She has a normal mood and affect. Her behavior is normal.    Ortho Exam sciatic notch tenderness on the right some pain with straight leg raising.  Scoliosis.  External rotation primarily in the tibia right lower extremity 45 degrees.  Short limb on the right side with shoe buildup.  No significant discomfort with internal/external rotation of her right hip.  Pulses intact negative Homan test.  Specialty Comments:  No specialty comments available.  Imaging: No results found.   PMFS History: Patient Active Problem List   Diagnosis Date Noted  . Trochanteric bursitis, left hip 06/01/2016  . Pain management contract agreement 11/28/2015  . Encounter for pain management counseling 04/11/2015  .  Chronic low back pain 04/11/2015  . BMI 27.0-27.9,adult 11/29/2014  . Diverticulosis 10/24/2013  . Osteopenia 10/24/2013  . Degenerative disc disease, lumbar 07/21/2012  . Hyperlipemia 07/21/2012  . GAD (generalized anxiety disorder) 07/21/2012  . Hypertension 07/21/2012   Past Medical History:  Diagnosis Date  . Ankle fracture, left 2006  . Diverticula, intestine    Mild case.  Marland Kitchen Hyperlipidemia   . Hypertension     Family History  Problem Relation Age of Onset  . Heart disease Mother        CHF  . Cancer Mother        Uterine  . Osteoporosis Mother   . COPD Sister   . Asthma Sister   .  Migraines Sister     Past Surgical History:  Procedure Laterality Date  . ANKLE SURGERY Left    Broken.  Has screws and metal plate.  . CHOLECYSTECTOMY     Social History   Occupational History  . Not on file  Tobacco Use  . Smoking status: Never Smoker  . Smokeless tobacco: Never Used  Substance and Sexual Activity  . Alcohol use: No  . Drug use: No  . Sexual activity: Not on file

## 2017-10-21 ENCOUNTER — Ambulatory Visit (INDEPENDENT_AMBULATORY_CARE_PROVIDER_SITE_OTHER): Payer: Medicare HMO | Admitting: Family Medicine

## 2017-10-21 ENCOUNTER — Encounter: Payer: Self-pay | Admitting: Family Medicine

## 2017-10-21 VITALS — BP 133/82 | HR 93 | Temp 97.9°F | Ht 61.0 in | Wt 145.0 lb

## 2017-10-21 DIAGNOSIS — G8929 Other chronic pain: Secondary | ICD-10-CM | POA: Diagnosis not present

## 2017-10-21 DIAGNOSIS — I10 Essential (primary) hypertension: Secondary | ICD-10-CM | POA: Diagnosis not present

## 2017-10-21 DIAGNOSIS — M545 Low back pain: Secondary | ICD-10-CM | POA: Diagnosis not present

## 2017-10-21 DIAGNOSIS — F411 Generalized anxiety disorder: Secondary | ICD-10-CM

## 2017-10-21 DIAGNOSIS — Z23 Encounter for immunization: Secondary | ICD-10-CM | POA: Diagnosis not present

## 2017-10-21 MED ORDER — HYOSCYAMINE SULFATE 0.125 MG PO TBDP: 0.1250 mg | ORAL_TABLET | Freq: Four times a day (QID) | ORAL | 4 refills | Status: DC | PRN

## 2017-10-21 MED ORDER — ALPRAZOLAM 0.25 MG PO TABS: 0.2500 mg | ORAL_TABLET | Freq: Every day | ORAL | 2 refills | Status: DC

## 2017-10-21 MED ORDER — DULOXETINE HCL 60 MG PO CPEP: 60.0000 mg | ORAL_CAPSULE | Freq: Every day | ORAL | 1 refills | Status: DC

## 2017-10-21 NOTE — Progress Notes (Signed)
BP 133/82   Pulse 93   Temp 97.9 F (36.6 C) (Oral)   Ht 5\' 1"  (1.549 m)   Wt 145 lb (65.8 kg)   BMI 27.40 kg/m    Subjective:    Patient ID: Maria Moss, female    DOB: [birth year], 67 y.o.   MRN: [id number]  HPI: Maria Moss is a 67 y.o. female presenting on 10/21/2017 for Medical Management of Chronic Issues   HPI Hypertension Patient is currently on enalapril and hydrochlorothiazide and amlodipine, and their blood pressure today is 133/82. Patient denies any lightheadedness or dizziness. Patient denies headaches, blurred vision, chest pains, shortness of breath, or weakness. Denies any side effects from medication and is content with current medication.   Anxiety recheck Patient is coming in today for anxiety recheck.  She is currently on Cymbalta and alprazolam for this and says that she has been doing well on these and denies any suicidal ideations and says her anxiety has for the most part been well controlled with this.  Depression screen [facility] 2/9 10/21/2017 09/02/2017 08/18/2017 07/15/2017 07/01/2017  Decreased Interest 0 0 0 0 1  Down, Depressed, Hopeless 0 0 0 1 0  PHQ - 2 Score 0 0 0 1 1  Altered sleeping - - - - -  Tired, decreased energy - - - - -  Change in appetite - - - - -  Feeling bad or failure about yourself  - - - - -  Trouble concentrating - - - - -  Moving slowly or fidgety/restless - - - - -  Suicidal thoughts - - - - -  PHQ-9 Score - - - - -    Has chronic low back pain for which she takes tramadol occasionally and Cymbalta and meloxicam and tries to exercise and stretching.  She says this is controlled.  Relevant past medical, surgical, family and social history reviewed and updated as indicated. Interim medical history since our last visit reviewed. Allergies and medications reviewed and updated.  Review of Systems  Constitutional: Negative for chills and fever.  Eyes: Negative for visual disturbance.  Respiratory: Negative for chest tightness and  shortness of breath.   Cardiovascular: Negative for chest pain and leg swelling.  Musculoskeletal: Positive for back pain. Negative for gait problem.  Skin: Negative for rash.  Neurological: Negative for light-headedness and headaches.  Psychiatric/Behavioral: Negative for agitation, behavioral problems, dysphoric mood, self-injury, sleep disturbance and suicidal ideas. The patient is nervous/anxious.   All other systems reviewed and are negative.   Per HPI unless specifically indicated above   Allergies as of 10/21/2017      Reactions   Codeine Nausea And Vomiting   Sulfa Antibiotics       Medication List        Accurate as of 10/21/17 10:03 AM. Always use your most recent med list.          ALPRAZolam 0.25 MG tablet Commonly known as:  XANAX Take 1 tablet (0.25 mg total) by mouth at bedtime.   amLODipine 5 MG tablet Commonly known as:  NORVASC TAKE 1 TABLET EVERY DAY   baclofen 10 MG tablet Commonly known as:  LIORESAL Take 1 tablet (10 mg total) by mouth at bedtime as needed for muscle spasms.   DULoxetine 60 MG capsule Commonly known as:  CYMBALTA Take 1 capsule (60 mg total) by mouth daily.   enalapril 20 MG tablet Commonly known as:  VASOTEC TAKE 1 AND 1/2 TABLETS EVERY DAY  Fish Oil 1000 MG Caps Take 2,000 mg by mouth.   fluticasone 50 MCG/ACT nasal spray Commonly known as:  FLONASE USE 2 SPRAYS IN EACH NOSTRIL EVERY DAY   hydrochlorothiazide 25 MG tablet Commonly known as:  HYDRODIURIL TAKE 1 TABLET EVERY DAY   hyoscyamine 0.125 MG Tbdp disintergrating tablet Commonly known as:  ANASPAZ Take 1 tablet (0.125 mg total) by mouth every 6 (six) hours as needed.   loratadine 10 MG tablet Commonly known as:  CLARITIN Take 10 mg by mouth daily.   meloxicam 15 MG tablet Commonly known as:  MOBIC TAKE 1 TABLET EVERY DAY   ondansetron 4 MG disintegrating tablet Commonly known as:  ZOFRAN-ODT Take 1 tablet (4 mg total) by mouth every 8 (eight) hours  as needed for nausea or vomiting.   pantoprazole 40 MG tablet Commonly known as:  PROTONIX Take 1 tablet (40 mg total) by mouth daily.   PROBIOTIC & ACIDOPHILUS EX ST PO Take by mouth.   traMADol 50 MG tablet Commonly known as:  ULTRAM Take 1 tablet (50 mg total) by mouth 3 (three) times daily as needed.   VITAMIN D-1000 MAX ST 1000 units tablet Generic drug:  Cholecalciferol Take by mouth.          Objective:    BP 133/82   Pulse 93   Temp 97.9 F (36.6 C) (Oral)   Ht 5\' 1"  (1.549 m)   Wt 145 lb (65.8 kg)   BMI 27.40 kg/m   Wt Readings from Last 3 Encounters:  10/21/17 145 lb (65.8 kg)  10/20/17 148 lb (67.1 kg)  09/29/17 148 lb (67.1 kg)    Physical Exam  Constitutional: She is oriented to person, place, and time. She appears well-developed and well-nourished. No distress.  Eyes: Conjunctivae are normal.  Cardiovascular: Normal rate, regular rhythm, normal heart sounds and intact distal pulses.  No murmur heard. Pulmonary/Chest: Effort normal and breath sounds normal. No respiratory distress. She has no wheezes.  Musculoskeletal:       Lumbar back: She exhibits tenderness. She exhibits normal range of motion and no bony tenderness.  Neurological: She is alert and oriented to person, place, and time. Coordination normal.  Skin: Skin is warm and dry. No rash noted. She is not diaphoretic.  Psychiatric: Her behavior is normal. Her mood appears anxious. She does not exhibit a depressed mood. She expresses no suicidal ideation. She expresses no suicidal plans.  Nursing note and vitals reviewed.     Assessment & Plan:   Problem List Items Addressed This Visit      Cardiovascular and Mediastinum   Hypertension - Primary (Chronic)   Relevant Orders   CBC with Differential/Platelet (Completed)   CMP14+EGFR (Completed)     Other   GAD (generalized anxiety disorder) (Chronic)   Relevant Medications   ALPRAZolam (XANAX) 0.25 MG tablet   DULoxetine (CYMBALTA) 60  MG capsule   Other Relevant Orders   CBC with Differential/Platelet (Completed)   CMP14+EGFR (Completed)   Chronic low back pain   Relevant Medications   DULoxetine (CYMBALTA) 60 MG capsule    Other Visit Diagnoses    Encounter for immunization       Relevant Orders   Flu vaccine HIGH DOSE PF (Completed)       Follow up plan: Return in about 3 months (around 01/21/2018), or if symptoms worsen or fail to improve, for Hypertension and anxiety.  Counseling provided for all of the vaccine components Orders Placed This Encounter  Procedures  .  CBC with Differential/Platelet  . CMP14+EGFR    Arville Care, MD [facility], [facility] 10/21/2017, 10:03 AM

## 2017-10-22 LAB — CBC WITH DIFFERENTIAL/PLATELET
Basophils Absolute: 0 10*3/uL (ref 0.0–0.2)
Basos: 0 %
EOS (ABSOLUTE): 0.4 10*3/uL (ref 0.0–0.4)
EOS: 4 %
HEMATOCRIT: 34.4 % (ref 34.0–46.6)
Hemoglobin: 11.4 g/dL (ref 11.1–15.9)
IMMATURE GRANULOCYTES: 0 %
Immature Grans (Abs): 0 10*3/uL (ref 0.0–0.1)
LYMPHS ABS: 1.8 10*3/uL (ref 0.7–3.1)
Lymphs: 21 %
MCH: 28.3 pg (ref 26.6–33.0)
MCHC: 33.1 g/dL (ref 31.5–35.7)
MCV: 85 fL (ref 79–97)
Monocytes Absolute: 0.8 10*3/uL (ref 0.1–0.9)
Monocytes: 10 %
NEUTROS PCT: 65 %
Neutrophils Absolute: 5.7 10*3/uL (ref 1.4–7.0)
PLATELETS: 496 10*3/uL — AB (ref 150–450)
RBC: 4.03 x10E6/uL (ref 3.77–5.28)
RDW: 13 % (ref 12.3–15.4)
WBC: 8.7 10*3/uL (ref 3.4–10.8)

## 2017-10-22 LAB — CMP14+EGFR
A/G RATIO: 2 (ref 1.2–2.2)
ALK PHOS: 114 IU/L (ref 39–117)
ALT: 19 IU/L (ref 0–32)
AST: 18 IU/L (ref 0–40)
Albumin: 4.4 g/dL (ref 3.6–4.8)
BUN/Creatinine Ratio: 23 (ref 12–28)
BUN: 25 mg/dL (ref 8–27)
Bilirubin Total: 0.4 mg/dL (ref 0.0–1.2)
CALCIUM: 10.4 mg/dL — AB (ref 8.7–10.3)
CHLORIDE: 98 mmol/L (ref 96–106)
CO2: 27 mmol/L (ref 20–29)
Creatinine, Ser: 1.07 mg/dL — ABNORMAL HIGH (ref 0.57–1.00)
GFR calc Af Amer: 63 mL/min/{1.73_m2} (ref >59)
GFR calc non Af Amer: 55 mL/min/{1.73_m2} — ABNORMAL LOW (ref >59)
GLOBULIN, TOTAL: 2.2 g/dL (ref 1.5–4.5)
Glucose: 84 mg/dL (ref 65–99)
POTASSIUM: 4.3 mmol/L (ref 3.5–5.2)
SODIUM: 139 mmol/L (ref 134–144)
Total Protein: 6.6 g/dL (ref 6.0–8.5)

## 2017-10-28 ENCOUNTER — Encounter: Payer: Self-pay | Admitting: Orthopaedic Surgery

## 2017-10-28 DIAGNOSIS — Z8612 Personal history of poliomyelitis: Secondary | ICD-10-CM | POA: Diagnosis not present

## 2017-10-28 DIAGNOSIS — M48061 Spinal stenosis, lumbar region without neurogenic claudication: Secondary | ICD-10-CM | POA: Diagnosis not present

## 2017-10-28 DIAGNOSIS — R1900 Intra-abdominal and pelvic swelling, mass and lump, unspecified site: Secondary | ICD-10-CM | POA: Diagnosis not present

## 2017-10-28 DIAGNOSIS — M545 Low back pain: Secondary | ICD-10-CM | POA: Diagnosis not present

## 2017-10-28 DIAGNOSIS — M47816 Spondylosis without myelopathy or radiculopathy, lumbar region: Secondary | ICD-10-CM | POA: Diagnosis not present

## 2017-10-28 DIAGNOSIS — M5126 Other intervertebral disc displacement, lumbar region: Secondary | ICD-10-CM | POA: Diagnosis not present

## 2017-11-03 ENCOUNTER — Encounter (INDEPENDENT_AMBULATORY_CARE_PROVIDER_SITE_OTHER): Payer: Self-pay | Admitting: Orthopaedic Surgery

## 2017-11-03 ENCOUNTER — Other Ambulatory Visit: Payer: Self-pay | Admitting: Family

## 2017-11-03 ENCOUNTER — Ambulatory Visit (INDEPENDENT_AMBULATORY_CARE_PROVIDER_SITE_OTHER): Payer: Medicare HMO | Admitting: Orthopaedic Surgery

## 2017-11-03 VITALS — BP 118/72 | HR 91 | Ht 61.0 in | Wt 145.0 lb

## 2017-11-03 DIAGNOSIS — M48061 Spinal stenosis, lumbar region without neurogenic claudication: Secondary | ICD-10-CM

## 2017-11-03 DIAGNOSIS — E278 Other specified disorders of adrenal gland: Secondary | ICD-10-CM

## 2017-11-04 DIAGNOSIS — Z9049 Acquired absence of other specified parts of digestive tract: Secondary | ICD-10-CM | POA: Diagnosis not present

## 2017-11-04 DIAGNOSIS — E279 Disorder of adrenal gland, unspecified: Secondary | ICD-10-CM | POA: Diagnosis not present

## 2017-11-04 DIAGNOSIS — K869 Disease of pancreas, unspecified: Secondary | ICD-10-CM | POA: Diagnosis not present

## 2017-11-04 DIAGNOSIS — K449 Diaphragmatic hernia without obstruction or gangrene: Secondary | ICD-10-CM | POA: Diagnosis not present

## 2017-11-06 ENCOUNTER — Encounter (INDEPENDENT_AMBULATORY_CARE_PROVIDER_SITE_OTHER): Payer: Self-pay | Admitting: Orthopaedic Surgery

## 2017-11-06 ENCOUNTER — Telehealth: Payer: Self-pay | Admitting: Family Medicine

## 2017-11-06 DIAGNOSIS — E278 Other specified disorders of adrenal gland: Secondary | ICD-10-CM

## 2017-11-06 NOTE — Progress Notes (Signed)
Office Visit Note   Patient: Maria Moss           Date of Birth: 03/03/1950           MRN: [id number] Visit Date: 11/03/2017              Requested by: Dettinger, Fransisca Kaufmann, MD [place], [place] [address] PCP: Dettinger, Fransisca Kaufmann, MD   Assessment & Plan: Visit Diagnoses:  1. Spinal stenosis of lumbar region, unspecified whether neurogenic claudication present   2. 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  She has some disc bulge at L3-4 with narrowing and right paracentral disc protrusion.  Worrisome is a 5.2 x 4.4 cm right suprarenal soft tissue mass and designated MRI or CT of the abdomen is recommended by radiologist and will be performed.  Discussed with patient in detail the possibilities of diagnosis benign versus malignant.  Follow-Up Instructions: No follow-ups on file.   Orders:  No orders of the defined types were placed in this encounter.  No orders of the defined types were placed in this encounter.     Procedures: No procedures performed   Clinical Data: No additional findings.   Subjective: Chief Complaint  Patient presents with  . Lower Back - Pain, Follow-up    MRI Lumbar Review    HPI 67 year old female returns with chronic pain in her lower back.  She is had trochanteric injections treated with anti-inflammatories with persistent back and right leg pain.  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  With her pre-existing weakness altered gait MRI scan was obtained at the lumbar spine to evaluate her for stenosis or nerve root compression.  Review of Systems systems updated unchanged from 09/29/2017 office visit.   Objective: Vital Signs: BP 118/72   Pulse 91   Ht 5\' 1"  (1.549 m)   Wt 145 lb (65.8 kg)   BMI 27.40 kg/m   Physical Exam  Constitutional: She is oriented to person, place, and time. She appears well-developed.  HENT:  Head:  Normocephalic.  Right Ear: External ear normal.  Left Ear: External ear normal.  Eyes: Pupils are equal, round, and reactive to light.  Neck: No tracheal deviation present. No thyromegaly present.  Cardiovascular: Normal rate.  Pulmonary/Chest: Effort normal.  Abdominal: Soft.  Neurological: She is alert and oriented to person, place, and time.  Skin: Skin is warm and dry.  Psychiatric: She has a normal mood and affect. Her behavior is normal.    Ortho Exam patient has sciatic notch tenderness on the right pain with straight leg raising.  Scoliosis is present.  She ambulates with external rotation right lower extremity at 45 degrees.  She has built up shoe with right lower extremity shortening.  Sensation of the leg is intact.  Distal pulses are intact.  Specialty Comments:  No specialty comments available.  Imaging: No results found.   PMFS History: Patient Active Problem List   Diagnosis Date Noted  . Trochanteric bursitis, left hip 06/01/2016  . Pain management contract agreement 11/28/2015  . Encounter for pain management counseling 04/11/2015  . Chronic low back pain 04/11/2015  . BMI 27.0-27.9,adult 11/29/2014  . Diverticulosis 10/24/2013  . Osteopenia 10/24/2013  . Degenerative disc disease, lumbar 07/21/2012  . Hyperlipemia 07/21/2012  . GAD (generalized anxiety disorder) 07/21/2012  . Hypertension 07/21/2012   Past Medical History:  Diagnosis Date  .  Ankle fracture, left 2006  . Diverticula, intestine    Mild case.  Marland Kitchen Hyperlipidemia   . Hypertension     Family History  Problem Relation Age of Onset  . Heart disease Mother        CHF  . Cancer Mother        Uterine  . Osteoporosis Mother   . COPD Sister   . Asthma Sister   . Migraines Sister     Past Surgical History:  Procedure Laterality Date  . ANKLE SURGERY Left    Broken.  Has screws and metal plate.  . CHOLECYSTECTOMY     Social History   Occupational History  . Not on file  Tobacco Use    . Smoking status: Never Smoker  . Smokeless tobacco: Never Used  Substance and Sexual Activity  . Alcohol use: No  . Drug use: No  . Sexual activity: Not on file

## 2017-11-06 NOTE — Telephone Encounter (Signed)
I cannot see the actual CT scan but I got a message from Dr.Mark Lorin Mercy who is an orthopedic that she had a CT scan of her spine or an MRI of her spine that showed a suprarenal or adrenal mass with possible necrosis and we need to do an oncology referral for her and likely find the imaging to send with it.  I have placed the oncology referral Caryl Pina, MD [facility] 11/06/2017, 8:46 PM

## 2017-11-07 ENCOUNTER — Telehealth (INDEPENDENT_AMBULATORY_CARE_PROVIDER_SITE_OTHER): Payer: Self-pay

## 2017-11-07 ENCOUNTER — Encounter (HOSPITAL_COMMUNITY): Payer: Self-pay | Admitting: *Deleted

## 2017-11-07 ENCOUNTER — Telehealth: Payer: Self-pay | Admitting: Family Medicine

## 2017-11-07 ENCOUNTER — Telehealth (INDEPENDENT_AMBULATORY_CARE_PROVIDER_SITE_OTHER): Payer: Self-pay | Admitting: Orthopaedic Surgery

## 2017-11-07 ENCOUNTER — Other Ambulatory Visit: Payer: Self-pay | Admitting: Family Medicine

## 2017-11-07 DIAGNOSIS — E278 Other specified disorders of adrenal gland: Secondary | ICD-10-CM

## 2017-11-07 DIAGNOSIS — K8689 Other specified diseases of pancreas: Secondary | ICD-10-CM

## 2017-11-07 NOTE — Progress Notes (Signed)
Patient is on way to discuss

## 2017-11-07 NOTE — Telephone Encounter (Signed)
Now we can just do the referral, I think Dr. Inda Merlin is spoken with her about the results, if not and she seems to not know about it then have her come in.

## 2017-11-07 NOTE — Progress Notes (Signed)
I called patient and had to leave a voicemail.  I introduced myself and left my contact number. I advised her that she was referred to our office and left her appointment date and time.  I asked that she return my call to let us know she is available for the appointment.  I left my number as well as the main number to the clinic.

## 2017-11-07 NOTE — Telephone Encounter (Signed)
Should she come in for these results? Please advise

## 2017-11-07 NOTE — Progress Notes (Signed)
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 Caryl Pina, MD [facility] 11/07/2017, 3:07 PM

## 2017-11-07 NOTE — Telephone Encounter (Signed)
LMOM for a return call. Dr. Warrick Parisian would like the patient to come in so he can discuss CT and MRI results with her. No appointment needed-just come in

## 2017-11-07 NOTE — Telephone Encounter (Signed)
Holding for you to clarify  Carlon with [facility] called concerning an order for patient.  Wanted to clarify if order needed to be an MRI of the abdomen or a CT of the abdomen.  CB# is [phone].  Please advise. Thank You.

## 2017-11-07 NOTE — Telephone Encounter (Signed)
Left detailed message that referral was placed and advised patient to call office with questions or stop by and speak with Dr Dettinger

## 2017-11-07 NOTE — Telephone Encounter (Signed)
See previous note

## 2017-11-07 NOTE — Telephone Encounter (Signed)
Maria Moss from [facility] called stating the Oncology department at [facility] need the CT scan report faxed. Maria Moss asked if the report can be faxed to her. The fax# is [phone]  The phone number is [phone]

## 2017-11-07 NOTE — Addendum Note (Signed)
Addended by: Meyer Cory on: 11/07/2017 08:30 AM   Modules accepted: Orders

## 2017-11-09 NOTE — Telephone Encounter (Signed)
Per April this was addressed by another physician.

## 2017-11-09 NOTE — Telephone Encounter (Signed)
See other note

## 2017-11-10 ENCOUNTER — Other Ambulatory Visit: Payer: Self-pay

## 2017-11-10 ENCOUNTER — Encounter (HOSPITAL_COMMUNITY): Payer: Self-pay | Admitting: Hematology

## 2017-11-10 ENCOUNTER — Inpatient Hospital Stay (HOSPITAL_COMMUNITY): Payer: Medicare HMO | Attending: Hematology | Admitting: Hematology

## 2017-11-10 ENCOUNTER — Inpatient Hospital Stay (HOSPITAL_COMMUNITY): Payer: Medicare HMO

## 2017-11-10 ENCOUNTER — Ambulatory Visit (INDEPENDENT_AMBULATORY_CARE_PROVIDER_SITE_OTHER): Payer: Medicare HMO | Admitting: Orthopaedic Surgery

## 2017-11-10 ENCOUNTER — Telehealth: Payer: Self-pay

## 2017-11-10 VITALS — BP 147/83 | HR 100 | Temp 98.2°F | Resp 18 | Wt 146.4 lb

## 2017-11-10 DIAGNOSIS — Z79899 Other long term (current) drug therapy: Secondary | ICD-10-CM | POA: Insufficient documentation

## 2017-11-10 DIAGNOSIS — Z808 Family history of malignant neoplasm of other organs or systems: Secondary | ICD-10-CM | POA: Diagnosis not present

## 2017-11-10 DIAGNOSIS — K8689 Other specified diseases of pancreas: Secondary | ICD-10-CM

## 2017-11-10 DIAGNOSIS — E278 Other specified disorders of adrenal gland: Secondary | ICD-10-CM

## 2017-11-10 DIAGNOSIS — R7989 Other specified abnormal findings of blood chemistry: Secondary | ICD-10-CM

## 2017-11-10 DIAGNOSIS — I10 Essential (primary) hypertension: Secondary | ICD-10-CM | POA: Diagnosis not present

## 2017-11-10 DIAGNOSIS — E279 Disorder of adrenal gland, unspecified: Secondary | ICD-10-CM | POA: Diagnosis not present

## 2017-11-10 LAB — CORTISOL: Cortisol, Plasma: 4.9 ug/dL

## 2017-11-10 NOTE — Progress Notes (Signed)
[facility] NOTE  Patient Care Team: Dettinger, Fransisca Kaufmann, MD as PCP - General (Family Medicine)  CHIEF COMPLAINTS/PURPOSE OF CONSULTATION:  Incidental right adrenal mass.  HISTORY OF PRESENTING ILLNESS:  Maria Moss 67 y.o. female is seen for evaluation and work-up of right adrenal mass.  She has developed right thigh pain with difficulty walking in the first week of August.  She was treated with prednisone and physical therapy for few weeks.  As the symptoms did not subside, she was referred to Dr.Mark Lorin Mercy who ordered an MRI of the lumbar spine on 10/28/2017 which showed incidental 5.2 x 4.4 cm right renal versus adrenal mass.  L3-L4 disc bulge, L4-L5 disc bulge and L5-S1 disc bulge with foraminal stenosis was also seen.  This was followed up with a CT scan of the abdomen on 11/04/2017 which showed small exophytic nodular lesion projecting of the tail of the pancreas measuring 7.5 mm.  There is a large complex right adrenal gland mass which appears largely cystic or necrotic but demonstrates nodular areas of contrast-enhancement along with scattered calcifications.  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  She denies any weight loss.  No fevers or night sweats was reported.  She does have hot flashes.  She was never smoker.  She worked in [employer] in [employer].  She denies any chemical exposure.  Last colonoscopy was in 2015 which showed mild diverticulosis.  Last mammogram was in February 2019.  She is seen today with her daughter and husband.  She is very active at home.  No decrease in appetite was reported. Family history was significant for mother with uterine cancer in her 3s.  Maternal uncle had malignant melanoma.  Another maternal uncle died of metastatic cancer to the bones.  MEDICAL HISTORY:  Past Medical History:  Diagnosis Date  . Ankle fracture, left 2006  . Diverticula, intestine    Mild case.  Marland Kitchen Hyperlipidemia   .  Hypertension     SURGICAL HISTORY: Past Surgical History:  Procedure Laterality Date  . ANKLE SURGERY Left    Broken.  Has screws and metal plate.  . CHOLECYSTECTOMY      SOCIAL HISTORY: Social History   Socioeconomic History  . Marital status: Married    Spouse name: Not on file  . Number of children: Not on file  . Years of education: Not on file  . Highest education level: Not on file  Occupational History  . Not on file  Social Needs  . Financial resource strain: Not on file  . Food insecurity:    Worry: Not on file    Inability: Not on file  . Transportation needs:    Medical: Not on file    Non-medical: Not on file  Tobacco Use  . Smoking status: Never Smoker  . Smokeless tobacco: Never Used  Substance and Sexual Activity  . Alcohol use: No  . Drug use: No  . Sexual activity: Not on file  Lifestyle  . Physical activity:    Days per week: Not on file    Minutes per session: Not on file  . Stress: Not on file  Relationships  . Social connections:    Talks on phone: Not on file    Gets together: Not on file    Attends religious service: Not on file    Active member of club or organization: Not on file    Attends meetings of clubs or organizations: Not on file  Relationship status: Not on file  . Intimate partner violence:    Fear of current or ex partner: Not on file    Emotionally abused: Not on file    Physically abused: Not on file    Forced sexual activity: Not on file  Other Topics Concern  . Not on file  Social History Narrative  . Not on file    FAMILY HISTORY: Family History  Problem Relation Age of Onset  . Heart disease Mother        CHF  . Cancer Mother        Uterine  . Osteoporosis Mother   . COPD Sister   . Asthma Sister   . Migraines Sister     ALLERGIES:  is allergic to codeine and sulfa antibiotics.  MEDICATIONS:  Current Outpatient Medications  Medication Sig Dispense Refill  . ALPRAZolam (XANAX) 0.25 MG tablet Take  1 tablet (0.25 mg total) by mouth at bedtime. 30 tablet 2  . amLODipine (NORVASC) 5 MG tablet TAKE 1 TABLET EVERY DAY 90 tablet 1  . baclofen (LIORESAL) 10 MG tablet Take 1 tablet (10 mg total) by mouth at bedtime as needed for muscle spasms. 60 tablet 2  . Cholecalciferol (VITAMIN D-1000 MAX ST) 1000 units tablet Take by mouth.    . DULoxetine (CYMBALTA) 60 MG capsule Take 1 capsule (60 mg total) by mouth daily. 90 capsule 1  . enalapril (VASOTEC) 20 MG tablet TAKE 1 AND 1/2 TABLETS EVERY DAY 135 tablet 1  . fluticasone (FLONASE) 50 MCG/ACT nasal spray USE 2 SPRAYS IN EACH NOSTRIL EVERY DAY 48 g 5  . hydrochlorothiazide (HYDRODIURIL) 25 MG tablet TAKE 1 TABLET EVERY DAY 90 tablet 1  . hyoscyamine (ANASPAZ) 0.125 MG TBDP disintergrating tablet Take 1 tablet (0.125 mg total) by mouth every 6 (six) hours as needed. 30 tablet 4  . loratadine (CLARITIN) 10 MG tablet Take 10 mg by mouth daily.    . meloxicam (MOBIC) 15 MG tablet TAKE 1 TABLET EVERY DAY 90 tablet 1  . Omega-3 Fatty Acids (FISH OIL) 1000 MG CAPS Take 2,000 mg by mouth.     . ondansetron (ZOFRAN ODT) 4 MG disintegrating tablet Take 1 tablet (4 mg total) by mouth every 8 (eight) hours as needed for nausea or vomiting. 30 tablet 2  . pantoprazole (PROTONIX) 40 MG tablet Take 1 tablet (40 mg total) by mouth daily. 30 tablet 5  . Probiotic Product (PROBIOTIC & ACIDOPHILUS EX ST PO) Take by mouth.    . traMADol (ULTRAM) 50 MG tablet Take 1 tablet (50 mg total) by mouth 3 (three) times daily as needed. 90 tablet 1   No current facility-administered medications for this visit.     REVIEW OF SYSTEMS:   Constitutional: Denies fevers, chills or abnormal night sweats.  Positive for fatigue. Eyes: Denies blurriness of vision, double vision or watery eyes Ears, nose, mouth, throat, and face: Denies mucositis or sore throat Respiratory: Denies cough, dyspnea or wheezes Cardiovascular: Denies palpitation, chest discomfort.  Positive for lower  extremity swelling. Gastrointestinal: Positive for constipation alternating with diarrhea. Skin: Denies abnormal skin rashes Lymphatics: Denies new lymphadenopathy or easy bruising Neurological:Denies numbness, tingling or new weaknesses Behavioral/Psych: Mood is stable, no new changes  All other systems were reviewed with the patient and are negative.  PHYSICAL EXAMINATION: ECOG PERFORMANCE STATUS: 1 - Symptomatic but completely ambulatory  Vitals:   11/10/17 1300  BP: (!) 147/83  Pulse: 100  Resp: 18  Temp: 98.2 F (36.8  C)  SpO2: 100%   Filed Weights   11/10/17 1300  Weight: 146 lb 6.4 oz (66.4 kg)    GENERAL:alert, no distress and comfortable SKIN: skin color, texture, turgor are normal, no rashes or significant lesions EYES: normal, conjunctiva are pink and non-injected, sclera clear OROPHARYNX:no exudate, no erythema and lips, buccal mucosa, and tongue normal  NECK: Right thyroid nodule present.  No palpable adenopathy in the neck. LYMPH:  no palpable lymphadenopathy in the cervical, axillary or inguinal LUNGS: clear to auscultation and percussion with normal breathing effort HEART: regular rate & rhythm and no murmurs and no lower extremity edema ABDOMEN:abdomen soft, non-tender and normal bowel sounds Musculoskeletal:no cyanosis of digits and no clubbing  PSYCH: alert & oriented x 3 with fluent speech NEURO: no focal motor/sensory deficits  LABORATORY DATA:  I have reviewed the data as listed Lab Results  Component Value Date   WBC 8.7 10/21/2017   HGB 11.4 10/21/2017   HCT 34.4 10/21/2017   MCV 85 10/21/2017   PLT 496 (H) 10/21/2017     Chemistry      Component Value Date/Time   NA 139 10/21/2017 1204   K 4.3 10/21/2017 1204   CL 98 10/21/2017 1204   CO2 27 10/21/2017 1204   BUN 25 10/21/2017 1204   CREATININE 1.07 (H) 10/21/2017 1204   CREATININE 0.74 07/21/2012 1042      Component Value Date/Time   CALCIUM 10.4 (H) 10/21/2017 1204   ALKPHOS 114  10/21/2017 1204   AST 18 10/21/2017 1204   ALT 19 10/21/2017 1204   BILITOT 0.4 10/21/2017 1204       RADIOGRAPHIC STUDIES: I have personally reviewed the radiological images as listed and agreed with the findings in the report. Xr Lumbar Spine Complete  Result Date: 10/20/2017 The lateral and oblique x-rays are obtained of lumbar spine.  This shows some loss of normal lumbar lordosis.  Patient has less lumbar curve previous gallbladder clips.  Marginal osteophytes at 4 5 endplate spurring and disc space narrowing multilevels lumbar without compression fracture. Impression: Multilevel lumbar disc degeneration with spurring and disc space narrowing.  Negative for anterior or retrolisthesis   ASSESSMENT & PLAN:  Mass of right adrenal gland ([place]) 1.  Right adrenal mass: - Patient has developed right thigh pain and difficulty walking in the first week of August, tried prednisone and PT without improvement. - She was evaluated by Dr. Lorin Mercy and was given a "shot" without improvement.  She underwent MRI of the lumbar spine without contrast on 10/28/2017 which showed 5.2 x 4.4 cm right renal versus suprarenal heterogeneous soft tissue mass.  L3-L4 disc bulge and L4-L5 disc bulge were seen. -CT scan of the abdomen and pelvis was done on 11/04/2017 which showed a large complex right adrenal gland mass which appears largely cystic or necrotic but demonstrates nodular areas of contrast-enhancement along with scattered calcifications.  Findings suspicious for adrenal cortical carcinoma.  There is a small exophytic nodular lesion projecting off the tail region of the pancreas, measuring 7.5 mm. - Patient denies any weight loss, fevers or night sweats.  She does have hot flashes. -She was a never smoker.  She worked in [employer] and denies any exposure to chemicals. -Family history significant for mother with uterine cancer in her 46s.  Maternal uncle had metastatic cancer to the bones.  Another  maternal uncle had melanoma. -She does not have any clinical features of Cushing syndrome or primary aldosteronism.  She does have a  right thyroid nodule palpable in the neck without adenopathy. - Last colonoscopy was in 2015 which showed mild diverticulosis.  Mammogram was on 02/28/2017 which was BI-RADS 2. - We will do hormonal evaluation including serum cortisol, ACTH, testosterone, estradiol.  We will also do 24-hour urine for free cortisol. -I have recommended doing a PET CT scan which helps in distinguishing adrenal carcinoma versus adenoma versus metastatic lesion. -I will see her back after the PET CT scan to discuss further plan of action.  We will schedule her for biopsy after that.  2.  Mild hypercalcemia: - She has mild hypercalcemia for several years.  Most likely primary hyperparathyroidism.  We will check a PTH level.  3.  Thrombocytosis: - She has mildly elevated platelet count since August 2017.  Likely reactive.  Orders Placed This Encounter  Procedures  . NM PET Image Initial (PI) Skull Base To Thigh    Standing Status:   Future    Standing Expiration Date:   11/10/2018    Order Specific Question:   ** REASON FOR EXAM (FREE TEXT)    Answer:   mass on right adrenal gland, and head of the pancreas    Order Specific Question:   If indicated for the ordered procedure, I authorize the administration of a radiopharmaceutical per Radiology protocol    Answer:   Yes    Order Specific Question:   Preferred imaging location?    Answer:   [facility]    Order Specific Question:   Radiology Contrast Protocol - do NOT remove file path    Answer:   [email or web address]  . Cortisol    Standing Status:   Future    Number of Occurrences:   1    Standing Expiration Date:   11/10/2018  . Cortisol,free,24 hour urine w/creatinine    Standing Status:   Future    Standing Expiration Date:   11/11/2018  . Testosterone    Standing Status:   Future    Number  of Occurrences:   1    Standing Expiration Date:   11/10/2018  . Estradiol    Standing Status:   Future    Number of Occurrences:   1    Standing Expiration Date:   11/10/2018  . PTH, intact and calcium    Standing Status:   Future    Number of Occurrences:   1    Standing Expiration Date:   11/10/2018  . ACTH    Standing Status:   Future    Number of Occurrences:   1    Standing Expiration Date:   11/11/2018    All questions were answered. The patient knows to call the clinic with any problems, questions or concerns.     Derek Jack, MD 11/10/2017 4:29 PM

## 2017-11-10 NOTE — Assessment & Plan Note (Addendum)
1.  Right adrenal mass: - Patient has developed right thigh pain and difficulty walking in the first week of August, tried prednisone and PT without improvement. - She was evaluated by Dr. Lorin Mercy and was given a "shot" without improvement.  She underwent MRI of the lumbar spine without contrast on 10/28/2017 which showed 5.2 x 4.4 cm right renal versus suprarenal heterogeneous soft tissue mass.  L3-L4 disc bulge and L4-L5 disc bulge were seen. -CT scan of the abdomen and pelvis was done on 11/04/2017 which showed a large complex right adrenal gland mass which appears largely cystic or necrotic but demonstrates nodular areas of contrast-enhancement along with scattered calcifications.  Findings suspicious for adrenal cortical carcinoma.  There is a small exophytic nodular lesion projecting off the tail region of the pancreas, measuring 7.5 mm. - Patient denies any weight loss, fevers or night sweats.  She does have hot flashes. -She was a never smoker.  She worked in [employer] and denies any exposure to chemicals. -Family history significant for mother with uterine cancer in her 26s.  Maternal uncle had metastatic cancer to the bones.  Another maternal uncle had melanoma. -She does not have any clinical features of Cushing syndrome or primary aldosteronism.  She does have a right thyroid nodule palpable in the neck without adenopathy. - Last colonoscopy was in 2015 which showed mild diverticulosis.  Mammogram was on 02/28/2017 which was BI-RADS 2. - We will do hormonal evaluation including serum cortisol, ACTH, testosterone, estradiol.  We will also do 24-hour urine for free cortisol. -I have recommended doing a PET CT scan which helps in distinguishing adrenal carcinoma versus adenoma versus metastatic lesion. -I will see her back after the PET CT scan to discuss further plan of action.  We will schedule her for biopsy after that.  2.  Mild hypercalcemia: - She has mild hypercalcemia for  several years.  Most likely primary hyperparathyroidism.  We will check a PTH level.  3.  Thrombocytosis: - She has mildly elevated platelet count since August 2017.  Likely reactive.

## 2017-11-10 NOTE — Patient Instructions (Signed)
[place] at [facility] Discharge Instructions  We will set you up with a PET SCAN.  We will follow up with you after your scan.    Thank you for choosing [place] at [facility] to provide your oncology and hematology care.  To afford each patient quality time with our provider, please arrive at least 15 minutes before your scheduled appointment time.   If you have a lab appointment with the [facility] please come in thru the  Main Entrance and check in at the main information desk  You need to re-schedule your appointment should you arrive 10 or more minutes late.  We strive to give you quality time with our providers, and arriving late affects you and other patients whose appointments are after yours.  Also, if you no show three or more times for appointments you may be dismissed from the clinic at the providers discretion.     Again, thank you for choosing [facility].  Our hope is that these requests will decrease the amount of time that you wait before being seen by our physicians.       _____________________________________________________________  Should you have questions after your visit to [facility], please contact our office at [phone] between the hours of 8:00 a.m. and 4:30 p.m.  Voicemails left after 4:00 p.m. will not be returned until the following business day.  For prescription refill requests, have your pharmacy contact our office and allow 72 hours.    Cancer Center Support Programs:   > Cancer Support Group  2nd Tuesday of the month 1pm-2pm, Journey Room

## 2017-11-10 NOTE — Telephone Encounter (Signed)
Patient said to cancel MRI because she went to cancer Dr today and they are going to do aq PET scan and she said the Dr. Rockey Situ her the place on her pancreas was nothng to worry about

## 2017-11-11 ENCOUNTER — Telehealth (HOSPITAL_COMMUNITY): Payer: Self-pay | Admitting: *Deleted

## 2017-11-11 LAB — PTH, INTACT AND CALCIUM
Calcium, Total (PTH): 10.5 mg/dL — ABNORMAL HIGH (ref 8.7–10.3)
PTH: 41 pg/mL (ref 15–65)

## 2017-11-11 LAB — ACTH: C206 ACTH: 6.5 pg/mL — AB (ref 7.2–63.3)

## 2017-11-11 LAB — TESTOSTERONE: Testosterone: <3 ng/dL — ABNORMAL LOW (ref 3–41)

## 2017-11-11 LAB — ESTRADIOL: Estradiol: <5 pg/mL

## 2017-11-11 NOTE — Telephone Encounter (Signed)
Ok sounds good

## 2017-11-14 ENCOUNTER — Other Ambulatory Visit (HOSPITAL_COMMUNITY)
Admission: RE | Admit: 2017-11-14 | Discharge: 2017-11-14 | Disposition: A | Discharge status: Home / Self Care | Payer: Medicare HMO | Source: Ambulatory Visit | Attending: Nurse Practitioner | Admitting: Nurse Practitioner

## 2017-11-14 DIAGNOSIS — E278 Other specified disorders of adrenal gland: Secondary | ICD-10-CM | POA: Diagnosis not present

## 2017-11-14 LAB — CREATININE, URINE, 24 HOUR
COLLECTION INTERVAL-UCRE24: 24 h
Creatinine, 24H Ur: 939 mg/d (ref 600–1800)
Creatinine, Urine: 43.15 mg/dL
Urine Total Volume-UCRE24: 2175 mL

## 2017-11-16 ENCOUNTER — Other Ambulatory Visit: Payer: Self-pay | Admitting: Family Medicine

## 2017-11-16 DIAGNOSIS — I10 Essential (primary) hypertension: Secondary | ICD-10-CM

## 2017-11-17 ENCOUNTER — Ambulatory Visit (HOSPITAL_COMMUNITY): Payer: Medicare HMO

## 2017-11-17 LAB — CORTISOL, URINE, FREE
Cortisol (Ur), Free: 9 ug/24 hr (ref 6–42)
Cortisol,F,ug/L,U: 4 ug/L
Total Volume: 2175

## 2017-11-18 ENCOUNTER — Ambulatory Visit (HOSPITAL_COMMUNITY)
Admission: RE | Admit: 2017-11-18 | Discharge: 2017-11-18 | Disposition: A | Discharge status: Home / Self Care | Payer: Medicare HMO | Source: Ambulatory Visit | Attending: Nurse Practitioner | Admitting: Nurse Practitioner

## 2017-11-18 DIAGNOSIS — E278 Other specified disorders of adrenal gland: Secondary | ICD-10-CM | POA: Insufficient documentation

## 2017-11-18 DIAGNOSIS — E279 Disorder of adrenal gland, unspecified: Secondary | ICD-10-CM | POA: Diagnosis not present

## 2017-11-18 LAB — GLUCOSE, CAPILLARY: GLUCOSE-CAPILLARY: 100 mg/dL — AB (ref 70–99)

## 2017-11-18 MED ORDER — FLUDEOXYGLUCOSE F - 18 (FDG) INJECTION
7.6000 | Freq: Once | INTRAVENOUS | Status: AC | PRN
  Administered 2017-11-18: 7.6 via INTRAVENOUS

## 2017-11-23 ENCOUNTER — Encounter (HOSPITAL_COMMUNITY): Payer: Self-pay | Admitting: Hematology

## 2017-11-23 ENCOUNTER — Inpatient Hospital Stay (HOSPITAL_COMMUNITY): Payer: Medicare HMO | Admitting: Hematology

## 2017-11-23 VITALS — BP 147/75 | HR 88 | Temp 98.7°F | Resp 18 | Wt 146.3 lb

## 2017-11-23 DIAGNOSIS — I10 Essential (primary) hypertension: Secondary | ICD-10-CM

## 2017-11-23 DIAGNOSIS — E279 Disorder of adrenal gland, unspecified: Secondary | ICD-10-CM

## 2017-11-23 DIAGNOSIS — Z808 Family history of malignant neoplasm of other organs or systems: Secondary | ICD-10-CM | POA: Diagnosis not present

## 2017-11-23 DIAGNOSIS — Z79899 Other long term (current) drug therapy: Secondary | ICD-10-CM | POA: Diagnosis not present

## 2017-11-23 DIAGNOSIS — R7989 Other specified abnormal findings of blood chemistry: Secondary | ICD-10-CM | POA: Diagnosis not present

## 2017-11-23 DIAGNOSIS — E278 Other specified disorders of adrenal gland: Secondary | ICD-10-CM

## 2017-11-23 NOTE — Progress Notes (Signed)
Maria Moss,  [id number]   CLINIC:  Medical Oncology/Hematology  PCP:  Dettinger, Maria Kaufmann, MD [place] [id number] [phone]   REASON FOR VISIT: Follow-up for right adrenal mass  CURRENT THERAPY: work up for surgery.    INTERVAL HISTORY:  Maria Moss 67 y.o. female returns for routine follow-up for right adrenal mass. Patient is here today with family. She experiencing increased fatigued and weakness. He also has bilateral lower leg swelling and occasional diarrhea. She manages the diarrhea with imodium. She denies any new pains. Denies any nausea or vomiting. Denies any fevers or recent infections. She reports her appetite at 75% and her energy level at 25%.     REVIEW OF SYSTEMS:  Review of Systems  Constitutional: Positive for chills and fatigue.  Cardiovascular: Positive for leg swelling.  Gastrointestinal: Positive for diarrhea.  Neurological: Positive for numbness.  Hematological: Bruises/bleeds easily.  All other systems reviewed and are negative.    PAST MEDICAL/SURGICAL HISTORY:  Past Medical History:  Diagnosis Date  . Ankle fracture, left 2006  . Diverticula, intestine    Mild case.  Marland Kitchen Hyperlipidemia   . Hypertension    Past Surgical History:  Procedure Laterality Date  . ANKLE SURGERY Left    Broken.  Has screws and metal plate.  . CHOLECYSTECTOMY       SOCIAL HISTORY:  Social History   Socioeconomic History  . Marital status: Married    Spouse name: Not on file  . Number of children: Not on file  . Years of education: Not on file  . Highest education level: Not on file  Occupational History  . Not on file  Social Needs  . Financial resource strain: Not on file  . Food insecurity:    Worry: Not on file    Inability: Not on file  . Transportation needs:    Medical: Not on file    Non-medical: Not on file  Tobacco Use  . Smoking status: Never Smoker  . Smokeless tobacco: Never  Used  Substance and Sexual Activity  . Alcohol use: No  . Drug use: No  . Sexual activity: Not on file  Lifestyle  . Physical activity:    Days per week: Not on file    Minutes per session: Not on file  . Stress: Not on file  Relationships  . Social connections:    Talks on phone: Not on file    Gets together: Not on file    Attends religious service: Not on file    Active member of club or organization: Not on file    Attends meetings of clubs or organizations: Not on file    Relationship status: Not on file  . Intimate partner violence:    Fear of current or ex partner: Not on file    Emotionally abused: Not on file    Physically abused: Not on file    Forced sexual activity: Not on file  Other Topics Concern  . Not on file  Social History Narrative  . Not on file    FAMILY HISTORY:  Family History  Problem Relation Age of Onset  . Heart disease Mother        CHF  . Cancer Mother        Uterine  . Osteoporosis Mother   . COPD Sister   . Asthma Sister   . Migraines Sister     CURRENT MEDICATIONS:  Outpatient Encounter  Medications as of 11/23/2017  Medication Sig Note  . ALPRAZolam (XANAX) 0.25 MG tablet Take 1 tablet (0.25 mg total) by mouth at bedtime.   Marland Kitchen amLODipine (NORVASC) 5 MG tablet TAKE 1 TABLET EVERY DAY   . baclofen (LIORESAL) 10 MG tablet Take 1 tablet (10 mg total) by mouth at bedtime as needed for muscle spasms.   . Cholecalciferol (VITAMIN D-1000 MAX ST) 1000 units tablet Take by mouth. 10/17/2015: Received from: [facility] Received Sig: Take 1,000 Units by mouth daily.  . diazepam (VALIUM) 5 MG tablet    . DULoxetine (CYMBALTA) 60 MG capsule Take 1 capsule (60 mg total) by mouth daily.   . enalapril (VASOTEC) 20 MG tablet TAKE 1 AND 1/2 TABLETS EVERY DAY   . fluticasone (FLONASE) 50 MCG/ACT nasal spray USE 2 SPRAYS IN EACH NOSTRIL EVERY DAY   . hydrochlorothiazide (HYDRODIURIL) 25 MG tablet TAKE 1 TABLET EVERY DAY   .  hyoscyamine (ANASPAZ) 0.125 MG TBDP disintergrating tablet Take 1 tablet (0.125 mg total) by mouth every 6 (six) hours as needed.   . loratadine (CLARITIN) 10 MG tablet Take 10 mg by mouth daily.   . meloxicam (MOBIC) 15 MG tablet TAKE 1 TABLET EVERY DAY   . Omega-3 Fatty Acids (FISH OIL) 1000 MG CAPS Take 2,000 mg by mouth.  10/17/2015: Received from: [facility] Received Sig: Take 2 capsules by mouth every morning.  . ondansetron (ZOFRAN ODT) 4 MG disintegrating tablet Take 1 tablet (4 mg total) by mouth every 8 (eight) hours as needed for nausea or vomiting.   . pantoprazole (PROTONIX) 40 MG tablet Take 1 tablet (40 mg total) by mouth daily.   . Probiotic Product (PROBIOTIC & ACIDOPHILUS EX ST PO) Take by mouth.   . traMADol (ULTRAM) 50 MG tablet Take 1 tablet (50 mg total) by mouth 3 (three) times daily as needed.    No facility-administered encounter medications on file as of 11/23/2017.     ALLERGIES:  Allergies  Allergen Reactions  . Codeine Nausea And Vomiting  . Sulfa Antibiotics      PHYSICAL EXAM:  ECOG Performance status: 1  Vitals:   11/23/17 1045  BP: (!) 147/75  Pulse: 88  Resp: 18  Temp: 98.7 F (37.1 C)  SpO2: 100%   Filed Weights   11/23/17 1045  Weight: 146 lb 4.8 oz (66.4 kg)    Physical Exam  Constitutional: She is oriented to person, place, and time. She appears well-developed and well-nourished.  Musculoskeletal: Normal range of motion.  Neurological: She is alert and oriented to person, place, and time.  Skin: Skin is warm and dry.  Psychiatric: She has a normal mood and affect. Her behavior is normal. Judgment and thought content normal.     LABORATORY DATA:  I have reviewed the labs as listed.  CBC    Component Value Date/Time   WBC 8.7 10/21/2017 1204   WBC 8.6 11/02/2013 1308   RBC 4.03 10/21/2017 1204   RBC 4.4 11/02/2013 1308   HGB 11.4 10/21/2017 1204   HCT 34.4 10/21/2017 1204   PLT 496 (H) 10/21/2017 1204    MCV 85 10/21/2017 1204   MCH 28.3 10/21/2017 1204   MCH 28.2 11/02/2013 1308   MCHC 33.1 10/21/2017 1204   MCHC 32.9 11/02/2013 1308   RDW 13.0 10/21/2017 1204   LYMPHSABS 1.8 10/21/2017 1204   EOSABS 0.4 10/21/2017 1204   BASOSABS 0.0 10/21/2017 1204   CMP Latest Ref  Rng & Units 11/10/2017 10/21/2017 06/02/2017  Glucose 65 - 99 mg/dL - 84 106(H)  BUN 8 - 27 mg/dL - 25 27  Creatinine 0.57 - 1.00 mg/dL - 1.07(H) 1.00  Sodium 134 - 144 mmol/L - 139 134  Potassium 3.5 - 5.2 mmol/L - 4.3 4.5  Chloride 96 - 106 mmol/L - 98 94(L)  CO2 20 - 29 mmol/L - 27 24  Calcium 8.7 - 10.3 mg/dL 10.5(H) 10.4(H) 10.5(H)  Total Protein 6.0 - 8.5 g/dL - 6.6 -  Total Bilirubin 0.0 - 1.2 mg/dL - 0.4 -  Alkaline Phos 39 - 117 IU/L - 114 -  AST 0 - 40 IU/L - 18 -  ALT 0 - 32 IU/L - 19 -       DIAGNOSTIC IMAGING:  I have independently reviewed images of the PET scan dated 11/18/2017 and showed to the patient..      ASSESSMENT & PLAN:   Mass of right adrenal gland ([place]) 1.  Right adrenal mass: - Patient has developed right thigh pain and difficulty walking in the first week of August, tried prednisone and PT without improvement. - She was evaluated by Dr. Lorin Mercy and was given a "shot" without improvement.  She underwent MRI of the lumbar spine without contrast on 10/28/2017 which showed 5.2 x 4.4 cm right renal versus suprarenal heterogeneous soft tissue mass.  L3-L4 disc bulge and L4-L5 disc bulge were seen. -CT scan of the abdomen and pelvis was done on 11/04/2017 which showed a large complex right adrenal gland mass which appears largely cystic or necrotic but demonstrates nodular areas of contrast-enhancement along with scattered calcifications.  Findings suspicious for adrenal cortical carcinoma.  There is a small exophytic nodular lesion projecting off the tail region of the pancreas, measuring 7.5 mm. - Patient denies any weight loss, fevers or night sweats.  She does have hot flashes. -She was  a never smoker.  She worked in [employer] and denies any exposure to chemicals. -Family history significant for mother with uterine cancer in her 9s.  Maternal uncle had metastatic cancer to the bones.  Another maternal uncle had melanoma. -She does not have any clinical features of Cushing syndrome or primary aldosteronism.  She does have a right thyroid nodule palpable in the neck without adenopathy. - Last colonoscopy was in 2015 which showed mild diverticulosis.  Mammogram was on 02/28/2017 which was BI-RADS 2. - I have reviewed the results of the PET CT scan dated 11/18/2017 which shows right adrenal mass measuring 5 x 4.5 cm.  There is central photopenia with mild peripheral metabolic activity with SUV 3.1.  This is similar to liver activity.  Small nodule along the tail of the pancreas is metabolically not active. - We have also reviewed the her blood work and urine work which showed normal urine free cortisol.  ACTH was mildly low.  Plasma cortisol was also mildly low.  Testosterone and estradiol were negative. - Unilateral adrenal masses, larger than 4 cm should be referred for surgical evaluation for resection.  There is a substantial increase in the incidence of adrenal cortical carcinoma's.  I will make a referral to surgery. -I will see her back in 6 weeks for follow-up.  2.  Mild hypercalcemia: - She has mild hypercalcemia for several years. - PTH was 41.  Calcium is minimally elevated at 10.5.  3.  Thrombocytosis: - She has mildly elevated platelet count since August 2017.  Likely reactive.      Orders placed  this encounter:  Orders Placed This Encounter  Procedures  . CBC with Differential/Platelet  . Comprehensive metabolic panel      Derek Jack, MD [place] [phone]

## 2017-11-23 NOTE — Assessment & Plan Note (Signed)
1.  Right adrenal mass: - Patient has developed right thigh pain and difficulty walking in the first week of August, tried prednisone and PT without improvement. - She was evaluated by Dr. Lorin Mercy and was given a "shot" without improvement.  She underwent MRI of the lumbar spine without contrast on 10/28/2017 which showed 5.2 x 4.4 cm right renal versus suprarenal heterogeneous soft tissue mass.  L3-L4 disc bulge and L4-L5 disc bulge were seen. -CT scan of the abdomen and pelvis was done on 11/04/2017 which showed a large complex right adrenal gland mass which appears largely cystic or necrotic but demonstrates nodular areas of contrast-enhancement along with scattered calcifications.  Findings suspicious for adrenal cortical carcinoma.  There is a small exophytic nodular lesion projecting off the tail region of the pancreas, measuring 7.5 mm. - Patient denies any weight loss, fevers or night sweats.  She does have hot flashes. -She was a never smoker.  She worked in [employer] and denies any exposure to chemicals. -Family history significant for mother with uterine cancer in her 53s.  Maternal uncle had metastatic cancer to the bones.  Another maternal uncle had melanoma. -She does not have any clinical features of Cushing syndrome or primary aldosteronism.  She does have a right thyroid nodule palpable in the neck without adenopathy. - Last colonoscopy was in 2015 which showed mild diverticulosis.  Mammogram was on 02/28/2017 which was BI-RADS 2. - I have reviewed the results of the PET CT scan dated 11/18/2017 which shows right adrenal mass measuring 5 x 4.5 cm.  There is central photopenia with mild peripheral metabolic activity with SUV 3.1.  This is similar to liver activity.  Small nodule along the tail of the pancreas is metabolically not active. - We have also reviewed the her blood work and urine work which showed normal urine free cortisol.  ACTH was mildly low.  Plasma cortisol was also  mildly low.  Testosterone and estradiol were negative. - Unilateral adrenal masses, larger than 4 cm should be referred for surgical evaluation for resection.  There is a substantial increase in the incidence of adrenal cortical carcinoma's.  I will make a referral to surgery. -I will see her back in 6 weeks for follow-up.  2.  Mild hypercalcemia: - She has mild hypercalcemia for several years. - PTH was 41.  Calcium is minimally elevated at 10.5.  3.  Thrombocytosis: - She has mildly elevated platelet count since August 2017.  Likely reactive.

## 2017-11-23 NOTE — Patient Instructions (Signed)
[facility] at [facility] Discharge Instructions     Thank you for choosing [facility] at [facility] to provide your oncology and hematology care.  To afford each patient quality time with our provider, please arrive at least 15 minutes before your scheduled appointment time.   If you have a lab appointment with the Cancer Center please come in thru the  Main Entrance and check in at the main information desk  You need to re-schedule your appointment should you arrive 10 or more minutes late.  We strive to give you quality time with our providers, and arriving late affects you and other patients whose appointments are after yours.  Also, if you no show three or more times for appointments you may be dismissed from the clinic at the providers discretion.     Again, thank you for choosing [facility].  Our hope is that these requests will decrease the amount of time that you wait before being seen by our physicians.       _____________________________________________________________  Should you have questions after your visit to  Cancer Center, please contact our office at [phone] between the hours of 8:00 a.m. and 4:30 p.m.  Voicemails left after 4:00 p.m. will not be returned until the following business day.  For prescription refill requests, have your pharmacy contact our office and allow 72 hours.    Cancer Center Support Programs:   > Cancer Support Group  2nd Tuesday of the month 1pm-2pm, Journey Room    

## 2017-11-24 ENCOUNTER — Encounter (HOSPITAL_COMMUNITY): Payer: Self-pay | Admitting: Lab

## 2017-11-24 NOTE — Progress Notes (Unsigned)
Referral sent to [facility] .  Records faxed on 11/21

## 2017-12-05 ENCOUNTER — Other Ambulatory Visit: Payer: Self-pay | Admitting: Family Medicine

## 2017-12-05 DIAGNOSIS — G2581 Restless legs syndrome: Secondary | ICD-10-CM

## 2017-12-05 DIAGNOSIS — R5383 Other fatigue: Secondary | ICD-10-CM

## 2017-12-05 NOTE — Telephone Encounter (Signed)
Last seen 10/21/17

## 2017-12-08 ENCOUNTER — Other Ambulatory Visit: Payer: Self-pay | Admitting: Family Medicine

## 2017-12-08 DIAGNOSIS — M545 Low back pain: Principal | ICD-10-CM

## 2017-12-08 DIAGNOSIS — M5136 Other intervertebral disc degeneration, lumbar region: Secondary | ICD-10-CM

## 2017-12-08 DIAGNOSIS — G8929 Other chronic pain: Secondary | ICD-10-CM

## 2017-12-08 DIAGNOSIS — Z0289 Encounter for other administrative examinations: Secondary | ICD-10-CM

## 2017-12-09 ENCOUNTER — Telehealth: Payer: Self-pay | Admitting: Family Medicine

## 2017-12-09 DIAGNOSIS — M5136 Other intervertebral disc degeneration, lumbar region: Secondary | ICD-10-CM

## 2017-12-09 DIAGNOSIS — Z0289 Encounter for other administrative examinations: Secondary | ICD-10-CM

## 2017-12-09 DIAGNOSIS — M545 Low back pain: Principal | ICD-10-CM

## 2017-12-09 DIAGNOSIS — G8929 Other chronic pain: Secondary | ICD-10-CM

## 2017-12-09 MED ORDER — TRAMADOL HCL 50 MG PO TABS: 50.0000 mg | ORAL_TABLET | Freq: Three times a day (TID) | ORAL | 1 refills | Status: DC | PRN

## 2017-12-09 NOTE — Telephone Encounter (Signed)
Sent refill of tramadol for the patient

## 2017-12-09 NOTE — Telephone Encounter (Signed)
Pt aware rx sent to the pharmacy. 

## 2017-12-15 DIAGNOSIS — E278 Other specified disorders of adrenal gland: Secondary | ICD-10-CM | POA: Diagnosis not present

## 2018-01-09 ENCOUNTER — Telehealth: Payer: Self-pay | Admitting: Family Medicine

## 2018-01-09 NOTE — Telephone Encounter (Signed)
Pt is scheduled to come in and see Maria Moss 1/7 at 8:20 pt is advised to go to ER if she get to feeling worse

## 2018-01-10 ENCOUNTER — Ambulatory Visit (INDEPENDENT_AMBULATORY_CARE_PROVIDER_SITE_OTHER): Payer: Medicare HMO | Admitting: Family Medicine

## 2018-01-10 ENCOUNTER — Encounter: Payer: Self-pay | Admitting: Family Medicine

## 2018-01-10 VITALS — BP 139/89 | HR 97 | Temp 98.2°F | Ht 61.0 in | Wt 149.0 lb

## 2018-01-10 DIAGNOSIS — W19XXXA Unspecified fall, initial encounter: Secondary | ICD-10-CM | POA: Diagnosis not present

## 2018-01-10 DIAGNOSIS — S0083XA Contusion of other part of head, initial encounter: Secondary | ICD-10-CM | POA: Diagnosis not present

## 2018-01-10 NOTE — Progress Notes (Signed)
Subjective:    Patient ID: Maria Moss, female    DOB: 09/18/1950, 68 y.o.   MRN: [id number]  Chief Complaint:  [place] 3 days ago and hit face on door facing   HPI: Maria Moss is a 67 y.o. female presenting on 01/10/2018 for [place] 3 days ago and hit face on door facing  Pt presents to the office today for a fall that occurred on Saturday 01/07/18. Pt states she tripped and fell into the door facing. Pt denies LOC, weakness, confusion, loss of function, or focal neurological deficits after the fall. Pt states she hit her forehead on the door facing. States initially the bruising was only on her forehead. States now she has bruising around both of her eyes. She denies visual disturbances.   Relevant past medical, surgical, family, and social history reviewed and updated as indicated.  Allergies and medications reviewed and updated.   Past Medical History:  Diagnosis Date  . Ankle fracture, left 2006  . Diverticula, intestine    Mild case.  Marland Kitchen Hyperlipidemia   . Hypertension     Past Surgical History:  Procedure Laterality Date  . ANKLE SURGERY Left    Broken.  Has screws and metal plate.  . CHOLECYSTECTOMY      Social History   Socioeconomic History  . Marital status: Married    Spouse name: Not on file  . Number of children: Not on file  . Years of education: Not on file  . Highest education level: Not on file  Occupational History  . Not on file  Social Needs  . Financial resource strain: Not on file  . Food insecurity:    Worry: Not on file    Inability: Not on file  . Transportation needs:    Medical: Not on file    Non-medical: Not on file  Tobacco Use  . Smoking status: Never Smoker  . Smokeless tobacco: Never Used  Substance and Sexual Activity  . Alcohol use: No  . Drug use: No  . Sexual activity: Not on file  Lifestyle  . Physical activity:    Days per week: Not on file    Minutes per session: Not on file  . Stress: Not on file  Relationships  .  Social connections:    Talks on phone: Not on file    Gets together: Not on file    Attends religious service: Not on file    Active member of club or organization: Not on file    Attends meetings of clubs or organizations: Not on file    Relationship status: Not on file  . Intimate partner violence:    Fear of current or ex partner: Not on file    Emotionally abused: Not on file    Physically abused: Not on file    Forced sexual activity: Not on file  Other Topics Concern  . Not on file  Social History Narrative  . Not on file    Outpatient Encounter Medications as of 01/10/2018  Medication Sig  . ALPRAZolam (XANAX) 0.25 MG tablet Take 1 tablet (0.25 mg total) by mouth at bedtime.  Marland Kitchen amLODipine (NORVASC) 5 MG tablet TAKE 1 TABLET EVERY DAY  . baclofen (LIORESAL) 10 MG tablet TAKE 1 TABLET (10 MG TOTAL) BY MOUTH AT BEDTIME AS NEEDED FOR MUSCLE SPASMS.  . Cholecalciferol (VITAMIN D-1000 MAX ST) 1000 units tablet Take by mouth.  . DULoxetine (CYMBALTA) 60 MG capsule Take 1 capsule (60 mg  total) by mouth daily.  . enalapril (VASOTEC) 20 MG tablet TAKE 1 AND 1/2 TABLETS EVERY DAY  . fluticasone (FLONASE) 50 MCG/ACT nasal spray USE 2 SPRAYS IN EACH NOSTRIL EVERY DAY  . hydrochlorothiazide (HYDRODIURIL) 25 MG tablet TAKE 1 TABLET EVERY DAY  . hyoscyamine (ANASPAZ) 0.125 MG TBDP disintergrating tablet Take 1 tablet (0.125 mg total) by mouth every 6 (six) hours as needed.  . loratadine (CLARITIN) 10 MG tablet Take 10 mg by mouth daily.  . meloxicam (MOBIC) 15 MG tablet TAKE 1 TABLET EVERY DAY  . Omega-3 Fatty Acids (FISH OIL) 1000 MG CAPS Take 2,000 mg by mouth.   . ondansetron (ZOFRAN ODT) 4 MG disintegrating tablet Take 1 tablet (4 mg total) by mouth every 8 (eight) hours as needed for nausea or vomiting.  . pantoprazole (PROTONIX) 40 MG tablet Take 1 tablet (40 mg total) by mouth daily.  . Probiotic Product (PROBIOTIC & ACIDOPHILUS EX ST PO) Take by mouth.  . traMADol (ULTRAM) 50 MG  tablet Take 1 tablet (50 mg total) by mouth 3 (three) times daily as needed.  . [DISCONTINUED] diazepam (VALIUM) 5 MG tablet    No facility-administered encounter medications on file as of 01/10/2018.     Allergies  Allergen Reactions  . Codeine Nausea And Vomiting  . Sulfa Antibiotics     Review of Systems  Constitutional: Negative for activity change, chills, fatigue and fever.  HENT: Positive for facial swelling. Negative for nosebleeds, rhinorrhea, trouble swallowing and voice change.   Eyes: Negative for photophobia, pain and visual disturbance.  Respiratory: Negative for cough, chest tightness and shortness of breath.   Cardiovascular: Negative for chest pain, palpitations and leg swelling.  Musculoskeletal: Negative for arthralgias.  Skin: Positive for wound.  Neurological: Negative for dizziness, tremors, seizures, syncope, facial asymmetry, speech difficulty, weakness, light-headedness, numbness and headaches.  Psychiatric/Behavioral: Negative for confusion.  All other systems reviewed and are negative.       Objective:    BP 139/89   Pulse 97   Temp 98.2 F (36.8 C) (Oral)   Ht 5\' 1"  (1.549 m)   Wt 149 lb (67.6 kg)   BMI 28.15 kg/m    Wt Readings from Last 3 Encounters:  01/10/18 149 lb (67.6 kg)  11/23/17 146 lb 4.8 oz (66.4 kg)  11/10/17 146 lb 6.4 oz (66.4 kg)    Physical Exam Vitals signs and nursing note reviewed.  Constitutional:      General: She is not in acute distress.    Appearance: She is well-developed, well-groomed and overweight. She is not ill-appearing.  HENT:     Head: Normocephalic. Contusion present. No Battle's sign.     Jaw: There is normal jaw occlusion. No tenderness.      Comments: Contusion to right frontal forehead, ecchymosis below bilateral eyes. No crepitus, deformity, or tenderness.      Right Ear: Hearing, tympanic membrane, ear canal and external ear normal. No tenderness. No mastoid tenderness. No hemotympanum.     Left  Ear: Hearing, tympanic membrane, ear canal and external ear normal. No tenderness. No mastoid tenderness. No hemotympanum.     Nose: Nose normal. No nasal deformity, septal deviation, signs of injury, nasal tenderness, mucosal edema or rhinorrhea.     Right Nostril: No epistaxis.     Left Nostril: No epistaxis.     Mouth/Throat:     Lips: Pink.     Mouth: Mucous membranes are moist. No injury.     Pharynx: Oropharynx is  clear.  Eyes:     General: Lids are normal. Vision grossly intact. Gaze aligned appropriately. No visual field deficit or scleral icterus.       Right eye: No discharge.        Left eye: No discharge.     Extraocular Movements: Extraocular movements intact.     Conjunctiva/sclera: Conjunctivae normal.     Pupils: Pupils are equal, round, and reactive to light.     Visual Fields: Right eye visual fields normal and left eye visual fields normal.  Neck:     Musculoskeletal: Full passive range of motion without pain and neck supple. Normal range of motion. No neck rigidity, crepitus, spinous process tenderness or muscular tenderness.     Trachea: Trachea and phonation normal.  Cardiovascular:     Rate and Rhythm: Normal rate and regular rhythm.     Heart sounds: Normal heart sounds. No murmur. No friction rub. No gallop.   Pulmonary:     Effort: Pulmonary effort is normal.     Breath sounds: Normal breath sounds and air entry.  Musculoskeletal: Normal range of motion.  Skin:    General: Skin is warm and dry.     Capillary Refill: Capillary refill takes less than 2 seconds.     Findings: Bruising (right frontal forehead) and ecchymosis (around bilateral eyes) present.  Neurological:     General: No focal deficit present.     Mental Status: She is alert and oriented to person, place, and time.     Cranial Nerves: Cranial nerves are intact.     Sensory: Sensation is intact.     Motor: Motor function is intact.     Coordination: Coordination is intact.     Gait: Gait is  intact.     Deep Tendon Reflexes: Reflexes are normal and symmetric.  Psychiatric:        Mood and Affect: Mood normal.        Behavior: Behavior normal. Behavior is cooperative.        Thought Content: Thought content normal.        Judgment: Judgment normal.     Results for orders placed or performed during the hospital encounter of 11/18/17  Glucose, capillary  Result Value Ref Range   Glucose-Capillary 100 (H) 70 - 99 mg/dL      Discussed case with Dr. Warrick Parisian and need for imaging, it was agreed that due to timing of fall and signs and symptoms, we can hold off on imaging at this time.   Pertinent labs & imaging results that were available during my care of the patient were reviewed by me and considered in my medical decision making.  Assessment & Plan:  Kitzia was seen today for fell 3 days ago and hit face on door facing.  Diagnoses and all orders for this visit:  Fall, initial encounter Fall prevention discussed.  Contusion of face, initial encounter Apply ice several times per day for at least 15 minutes. Make sure to have a barrier between the ice and your skin. Report any new or worsening symptoms. Aware of signs and symptoms that require emergent evaluation.   Continue all other maintenance medications.  Follow up plan: Return in about 4 weeks (around 02/07/2018), or if symptoms worsen or fail to improve.  Educational handout given for fall, contusion  The above assessment and management plan was discussed with the patient. The patient verbalized understanding of and has agreed to the management plan. Patient is aware to call the  clinic if symptoms persist or worsen. Patient is aware when to return to the clinic for a follow-up visit. Patient educated on when it is appropriate to go to the emergency department.   Monia Pouch, FNP-C [facility] [phone]

## 2018-01-10 NOTE — Patient Instructions (Addendum)
Contusion  A contusion is a deep bruise. Contusions happen when an injury causes bleeding under the skin. Symptoms of bruising include pain, swelling, and discolored skin. The skin may turn blue, purple, or yellow. Follow these instructions at home:  Rest the injured area.  If told, put ice on the injured area. ? Put ice in a plastic bag. ? Place a towel between your skin and the bag. ? Leave the ice on for 20 minutes, 2-3 times per day.  If told, put light pressure (compression) on the injured area using an elastic bandage. Make sure the bandage is not too tight. Remove it and put it back on as told by your doctor.  If possible, raise (elevate) the injured area above the level of your heart while you are sitting or lying down.  Take over-the-counter and prescription medicines only as told by your doctor. Contact a doctor if:  Your symptoms do not get better after several days of treatment.  Your symptoms get worse.  You have trouble moving the injured area. Get help right away if:  You have very bad pain.  You have a loss of feeling (numbness) in a hand or foot.  Your hand or foot turns pale or cold. This information is not intended to replace advice given to you by your health care provider. Make sure you discuss any questions you have with your health care provider. Document Released: 06/09/2007 Document Revised: 05/29/2015 Document Reviewed: 05/08/2014 Elsevier Interactive Patient Education  2019 [place] in the Home, Adult Falls can cause injuries. They can happen to people of all ages. There are many things you can do to make your home safe and to help prevent falls. Ask for help when making these changes, if needed. What actions can I take to prevent falls? General Instructions  Use good lighting in all rooms. Replace any light bulbs that burn out.  Turn on the lights when you go into a dark area. Use night-lights.  Keep items that you use often  in easy-to-reach places. Lower the shelves around your home if necessary.  Set up your furniture so you have a clear path. Avoid moving your furniture around.  Do not have throw rugs and other things on the floor that can make you trip.  Avoid walking on wet floors.  If any of your floors are uneven, fix them.  Add color or contrast paint or tape to clearly mark and help you see: ? Any grab bars or handrails. ? First and last steps of stairways. ? Where the edge of each step is.  If you use a stepladder: ? Make sure that it is fully opened. Do not climb a closed stepladder. ? Make sure that both sides of the stepladder are locked into place. ? Ask someone to hold the stepladder for you while you use it.  If there are any pets around you, be aware of where they are. What can I do in the bathroom?      Keep the floor dry. Clean up any water that spills onto the floor as soon as it happens.  Remove soap buildup in the tub or shower regularly.  Use non-skid mats or decals on the floor of the tub or shower.  Attach bath mats securely with double-sided, non-slip rug tape.  If you need to sit down in the shower, use a plastic, non-slip stool.  Install grab bars by the toilet and in the tub and shower. Do not use  towel bars as grab bars. What can I do in the bedroom?  Make sure that you have a light by your bed that is easy to reach.  Do not use any sheets or blankets that are too big for your bed. They should not hang down onto the floor.  Have a firm chair that has side arms. You can use this for support while you get dressed. What can I do in the kitchen?  Clean up any spills right away.  If you need to reach something above you, use a strong step stool that has a grab bar.  Keep electrical cords out of the way.  Do not use floor polish or wax that makes floors slippery. If you must use wax, use non-skid floor wax. What can I do with my stairs?  Do not leave any  items on the stairs.  Make sure that you have a light switch at the top of the stairs and the bottom of the stairs. If you do not have them, ask someone to add them for you.  Make sure that there are handrails on both sides of the stairs, and use them. Fix handrails that are broken or loose. Make sure that handrails are as long as the stairways.  Install non-slip stair treads on all stairs in your home.  Avoid having throw rugs at the top or bottom of the stairs. If you do have throw rugs, attach them to the floor with carpet tape.  Choose a carpet that does not hide the edge of the steps on the stairway.  Check any carpeting to make sure that it is firmly attached to the stairs. Fix any carpet that is loose or worn. What can I do on the outside of my home?  Use bright outdoor lighting.  Regularly fix the edges of walkways and driveways and fix any cracks.  Remove anything that might make you trip as you walk through a door, such as a raised step or threshold.  Trim any bushes or trees on the path to your home.  Regularly check to see if handrails are loose or broken. Make sure that both sides of any steps have handrails.  Install guardrails along the edges of any raised decks and porches.  Clear walking paths of anything that might make someone trip, such as tools or rocks.  Have any leaves, snow, or ice cleared regularly.  Use sand or salt on walking paths during winter.  Clean up any spills in your garage right away. This includes grease or oil spills. What other actions can I take?  Wear shoes that: ? Have a low heel. Do not wear high heels. ? Have rubber bottoms. ? Are comfortable and fit you well. ? Are closed at the toe. Do not wear open-toe sandals.  Use tools that help you move around (mobility aids) if they are needed. These include: ? Canes. ? Walkers. ? Scooters. ? Crutches.  Review your medicines with your doctor. Some medicines can make you feel dizzy.  This can increase your chance of falling. Ask your doctor what other things you can do to help prevent falls. Where to find more information  Centers for Disease Control and Prevention, [employer]AD[employer]: https://garcia.biz/  [employer] on Aging: BrainJudge.co.uk Contact a doctor if:  You are afraid of falling at home.  You feel weak, drowsy, or dizzy at home.  You fall at home. Summary  There are many simple things that you can do to make your home  safe and to help prevent falls.  Ways to make your home safe include removing tripping hazards and installing grab bars in the bathroom.  Ask for help when making these changes in your home. This information is not intended to replace advice given to you by your health care provider. Make sure you discuss any questions you have with your health care provider. Document Released: 10/17/2008 Document Revised: 08/05/2016 Document Reviewed: 08/05/2016 Elsevier Interactive Patient Education  2019 [employer].

## 2018-01-13 ENCOUNTER — Ambulatory Visit (HOSPITAL_COMMUNITY): Payer: Medicare HMO | Admitting: Hematology

## 2018-01-13 ENCOUNTER — Other Ambulatory Visit (HOSPITAL_COMMUNITY): Payer: Medicare HMO

## 2018-01-19 ENCOUNTER — Telehealth: Payer: Self-pay | Admitting: Family Medicine

## 2018-01-19 NOTE — Telephone Encounter (Signed)
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 We referred her to a cancer doctor, then they referred her to a surgeon and she is in so sick with diarrhea and so weak that she can't sit up. He is wanting to talk to Dr Dettinger about all of this.

## 2018-01-19 NOTE — Telephone Encounter (Signed)
I do not believe that the mass is related to her having the diarrhea.  Continue to follow-up with them for that.  The diarrhea sounds like something different and new, she can definitely use over-the-counter Imodium and try and stay hydrated with things that have electrolytes like Pedialyte.  If she is that severely weak and dehydrated then she would likely needs to go to the emergency department to get IV fluids

## 2018-01-19 NOTE — Telephone Encounter (Signed)
Husband is aware and is taking her to ED

## 2018-01-27 ENCOUNTER — Ambulatory Visit (INDEPENDENT_AMBULATORY_CARE_PROVIDER_SITE_OTHER): Payer: Medicare HMO | Admitting: Family Medicine

## 2018-01-27 ENCOUNTER — Other Ambulatory Visit (HOSPITAL_COMMUNITY): Payer: Medicare HMO

## 2018-01-27 ENCOUNTER — Encounter: Payer: Self-pay | Admitting: Family Medicine

## 2018-01-27 VITALS — BP 135/76 | HR 82 | Temp 97.0°F | Ht 61.0 in | Wt 144.8 lb

## 2018-01-27 DIAGNOSIS — I10 Essential (primary) hypertension: Secondary | ICD-10-CM

## 2018-01-27 DIAGNOSIS — Z0289 Encounter for other administrative examinations: Secondary | ICD-10-CM | POA: Diagnosis not present

## 2018-01-27 DIAGNOSIS — M545 Low back pain: Secondary | ICD-10-CM | POA: Diagnosis not present

## 2018-01-27 DIAGNOSIS — M5136 Other intervertebral disc degeneration, lumbar region: Secondary | ICD-10-CM | POA: Diagnosis not present

## 2018-01-27 DIAGNOSIS — E782 Mixed hyperlipidemia: Secondary | ICD-10-CM | POA: Diagnosis not present

## 2018-01-27 DIAGNOSIS — G8929 Other chronic pain: Secondary | ICD-10-CM

## 2018-01-27 LAB — CBC WITH DIFFERENTIAL/PLATELET
BASOS: 0 %
Basophils Absolute: 0 10*3/uL (ref 0.0–0.2)
EOS (ABSOLUTE): 0.3 10*3/uL (ref 0.0–0.4)
Eos: 3 %
HEMATOCRIT: 32.5 % — AB (ref 34.0–46.6)
Hemoglobin: 10.8 g/dL — ABNORMAL LOW (ref 11.1–15.9)
IMMATURE GRANS (ABS): 0.1 10*3/uL (ref 0.0–0.1)
IMMATURE GRANULOCYTES: 1 %
Lymphocytes Absolute: 2 10*3/uL (ref 0.7–3.1)
Lymphs: 23 %
MCH: 27.6 pg (ref 26.6–33.0)
MCHC: 33.2 g/dL (ref 31.5–35.7)
MCV: 83 fL (ref 79–97)
MONOS ABS: 0.8 10*3/uL (ref 0.1–0.9)
Monocytes: 10 %
NEUTROS ABS: 5.5 10*3/uL (ref 1.4–7.0)
NEUTROS PCT: 63 %
Platelets: 561 10*3/uL — ABNORMAL HIGH (ref 150–450)
RBC: 3.92 x10E6/uL (ref 3.77–5.28)
RDW: 13.1 % (ref 11.7–15.4)
WBC: 8.6 10*3/uL (ref 3.4–10.8)

## 2018-01-27 LAB — CMP14+EGFR
A/G RATIO: 2.1 (ref 1.2–2.2)
ALT: 38 IU/L — ABNORMAL HIGH (ref 0–32)
AST: 26 IU/L (ref 0–40)
Albumin: 4.4 g/dL (ref 3.8–4.8)
Alkaline Phosphatase: 148 IU/L — ABNORMAL HIGH (ref 39–117)
BILIRUBIN TOTAL: 0.7 mg/dL (ref 0.0–1.2)
BUN/Creatinine Ratio: 19 (ref 12–28)
BUN: 22 mg/dL (ref 8–27)
CALCIUM: 10.3 mg/dL (ref 8.7–10.3)
CHLORIDE: 100 mmol/L (ref 96–106)
CO2: 24 mmol/L (ref 20–29)
Creatinine, Ser: 1.13 mg/dL — ABNORMAL HIGH (ref 0.57–1.00)
GFR calc Af Amer: 59 mL/min/{1.73_m2} — ABNORMAL LOW (ref >59)
GFR, EST NON AFRICAN AMERICAN: 51 mL/min/{1.73_m2} — AB (ref >59)
GLOBULIN, TOTAL: 2.1 g/dL (ref 1.5–4.5)
Glucose: 94 mg/dL (ref 65–99)
POTASSIUM: 4.5 mmol/L (ref 3.5–5.2)
SODIUM: 141 mmol/L (ref 134–144)
TOTAL PROTEIN: 6.5 g/dL (ref 6.0–8.5)

## 2018-01-27 MED ORDER — ALPRAZOLAM 0.25 MG PO TABS: 0.2500 mg | ORAL_TABLET | Freq: Every day | ORAL | 2 refills | Status: DC

## 2018-01-27 MED ORDER — DULOXETINE HCL 60 MG PO CPEP: 60.0000 mg | ORAL_CAPSULE | Freq: Every day | ORAL | 1 refills | Status: DC

## 2018-01-27 MED ORDER — TRAMADOL HCL 50 MG PO TABS: 50.0000 mg | ORAL_TABLET | Freq: Three times a day (TID) | ORAL | 1 refills | Status: DC | PRN

## 2018-01-27 NOTE — Progress Notes (Signed)
BP 135/76   Pulse 82   Temp (!) 97 F (36.1 C) (Oral)   Ht 5\' 1"  (1.549 m)   Wt 144 lb 12.8 oz (65.7 kg)   BMI 27.36 kg/m    Subjective:    Patient ID: Maria Moss, female    DOB: Jan 29, 1950, 68 y.o.   MRN: [id number]  HPI: Maria Moss is a 68 y.o. female presenting on 01/27/2018 for Anxiety (3 month follow up) and Hypertension   HPI Hypertension Patient is currently on amlodipine and hctz and enalapril, and their blood pressure today is 134/76. Patient denies any lightheadedness or dizziness. Patient denies headaches, blurred vision, chest pains, shortness of breath, or weakness. Denies any side effects from medication and is content with current medication.   Hyperlipidemia Patient is coming in for recheck of his hyperlipidemia. The patient is currently taking fish oils. They deny any issues with myalgias or history of liver damage from it. They deny any focal numbness or weakness or chest pain.   Chronic pain and anxiety medication refill Patient is coming in for chronic pain and anxiety and insomnia medication refill.  She has been doing good with the pain but her insomnia has been worsening and her anxiety has been slightly picking up with new mass they found in the adrenal gland, she continues to take the alprazolam we did recommend possibly adding melatonin.  She denies any suicidal ideations or thoughts of hurting herself. Depression screen [facility] 2/9 01/27/2018 01/10/2018 10/21/2017 09/02/2017 08/18/2017  Decreased Interest 0 0 0 0 0  Down, Depressed, Hopeless 0 0 0 0 0  PHQ - 2 Score 0 0 0 0 0  Altered sleeping - - - - -  Tired, decreased energy - - - - -  Change in appetite - - - - -  Feeling bad or failure about yourself  - - - - -  Trouble concentrating - - - - -  Moving slowly or fidgety/restless - - - - -  Suicidal thoughts - - - - -  PHQ-9 Score - - - - -     Relevant past medical, surgical, family and social history reviewed and updated as indicated. Interim medical  history since our last visit reviewed. Allergies and medications reviewed and updated.  Review of Systems  Constitutional: Negative for chills and fever.  Eyes: Negative for visual disturbance.  Respiratory: Negative for chest tightness and shortness of breath.   Cardiovascular: Negative for chest pain and leg swelling.  Genitourinary: Negative for difficulty urinating and dysuria.  Musculoskeletal: Positive for back pain. Negative for gait problem.  Skin: Negative for rash.  Neurological: Negative for light-headedness and headaches.  Psychiatric/Behavioral: Positive for sleep disturbance. Negative for agitation, behavioral problems, self-injury and suicidal ideas. The patient is nervous/anxious.   All other systems reviewed and are negative.   Per HPI unless specifically indicated above   Allergies as of 01/27/2018      Reactions   Codeine Nausea And Vomiting   Sulfa Antibiotics       Medication List       Accurate as of January 27, 2018  9:50 AM. Always use your most recent med list.        ALPRAZolam 0.25 MG tablet Commonly known as:  XANAX Take 1 tablet (0.25 mg total) by mouth at bedtime.   amLODipine 5 MG tablet Commonly known as:  NORVASC TAKE 1 TABLET EVERY DAY   baclofen 10 MG tablet Commonly known as:  LIORESAL TAKE  1 TABLET (10 MG TOTAL) BY MOUTH AT BEDTIME AS NEEDED FOR MUSCLE SPASMS.   DULoxetine 60 MG capsule Commonly known as:  CYMBALTA Take 1 capsule (60 mg total) by mouth daily.   enalapril 20 MG tablet Commonly known as:  VASOTEC TAKE 1 AND 1/2 TABLETS EVERY DAY   Fish Oil 1000 MG Caps Take 2,000 mg by mouth.   fluticasone 50 MCG/ACT nasal spray Commonly known as:  FLONASE USE 2 SPRAYS IN EACH NOSTRIL EVERY DAY   hydrochlorothiazide 25 MG tablet Commonly known as:  HYDRODIURIL TAKE 1 TABLET EVERY DAY   hyoscyamine 0.125 MG Tbdp disintergrating tablet Commonly known as:  ANASPAZ Take 1 tablet (0.125 mg total) by mouth every 6 (six)  hours as needed.   loratadine 10 MG tablet Commonly known as:  CLARITIN Take 10 mg by mouth daily.   meloxicam 15 MG tablet Commonly known as:  MOBIC TAKE 1 TABLET EVERY DAY   ondansetron 4 MG disintegrating tablet Commonly known as:  ZOFRAN ODT Take 1 tablet (4 mg total) by mouth every 8 (eight) hours as needed for nausea or vomiting.   pantoprazole 40 MG tablet Commonly known as:  PROTONIX Take 1 tablet (40 mg total) by mouth daily.   PROBIOTIC & ACIDOPHILUS EX ST PO Take by mouth.   traMADol 50 MG tablet Commonly known as:  ULTRAM Take 1 tablet (50 mg total) by mouth 3 (three) times daily as needed.   VITAMIN D-1000 MAX ST 25 MCG (1000 UT) tablet Generic drug:  Cholecalciferol Take by mouth.          Objective:    BP 135/76   Pulse 82   Temp (!) 97 F (36.1 C) (Oral)   Ht 5\' 1"  (1.549 m)   Wt 144 lb 12.8 oz (65.7 kg)   BMI 27.36 kg/m   Wt Readings from Last 3 Encounters:  01/27/18 144 lb 12.8 oz (65.7 kg)  01/10/18 149 lb (67.6 kg)  11/23/17 146 lb 4.8 oz (66.4 kg)    Physical Exam Vitals signs and nursing note reviewed.  Constitutional:      General: She is not in acute distress.    Appearance: She is well-developed. She is not diaphoretic.  Eyes:     Conjunctiva/sclera: Conjunctivae normal.  Cardiovascular:     Rate and Rhythm: Normal rate and regular rhythm.     Heart sounds: Normal heart sounds. No murmur.  Pulmonary:     Effort: Pulmonary effort is normal. No respiratory distress.     Breath sounds: Normal breath sounds. No wheezing.  Skin:    General: Skin is warm and dry.     Findings: No rash.  Neurological:     Mental Status: She is alert and oriented to person, place, and time.     Coordination: Coordination normal.  Psychiatric:        Behavior: Behavior normal.       Assessment & Plan:   Problem List Items Addressed This Visit      Cardiovascular and Mediastinum   Hypertension - Primary (Chronic)   Relevant Orders   CBC  with Differential/Platelet   CMP14+EGFR     Musculoskeletal and Integument   Degenerative disc disease, lumbar (Chronic)   Relevant Medications   traMADol (ULTRAM) 50 MG tablet   Other Relevant Orders   CBC with Differential/Platelet     Other   Hyperlipemia (Chronic)   Relevant Orders   CMP14+EGFR   Chronic low back pain   Relevant Medications  DULoxetine (CYMBALTA) 60 MG capsule   traMADol (ULTRAM) 50 MG tablet   Other Relevant Orders   CBC with Differential/Platelet   Pain management contract agreement   Relevant Medications   traMADol (ULTRAM) 50 MG tablet   Other Relevant Orders   CBC with Differential/Platelet      Follow up plan: Return in about 3 months (around 04/28/2018), or if symptoms worsen or fail to improve, for Recheck pain and hypertension and cholesterol.  Counseling provided for all of the vaccine components Orders Placed This Encounter  Procedures  . CBC with Differential/Platelet  . CMP14+EGFR    Arville Care, MD [place] 01/27/2018, 9:50 AM

## 2018-01-31 ENCOUNTER — Ambulatory Visit: Payer: Medicare HMO | Admitting: Internal Medicine

## 2018-01-31 ENCOUNTER — Encounter: Payer: Self-pay | Admitting: Internal Medicine

## 2018-01-31 VITALS — BP 140/82 | HR 87 | Ht 61.0 in | Wt 147.0 lb

## 2018-01-31 DIAGNOSIS — E278 Other specified disorders of adrenal gland: Secondary | ICD-10-CM | POA: Diagnosis not present

## 2018-01-31 LAB — POTASSIUM: POTASSIUM: 4.3 meq/L (ref 3.5–5.1)

## 2018-01-31 MED ORDER — DEXAMETHASONE 1 MG PO TABS: ORAL_TABLET | ORAL | 0 refills | Status: DC

## 2018-01-31 NOTE — Progress Notes (Addendum)
Patient ID: [name], female   DOB: 1950/07/24, 68 y.o.   MRN: [id number]    HPI  Maria Moss is a 68 y.o.-year-old female, referred by Dr. 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  Pt's adrenal mass was incidentally found during investigation for leg pain and pbs walking.  At that time, she saw her orthopedic doctor and had a lumbar MRI checked.   I reviewed pt's previous imaging tests: Lumbar MRI (10/28/2017): 5.2 x 4.4 R renal or adrenal heterogeneous soft tissue mass  CT abdomen w/ and w/o contrast (11/04/2017): R adrenal 5 cm complex necrotic appearing and enhancing mass with nodular areas of contrast enhancement and scattered areas of calcification >> possible RCC vs. metastasis  PET CT (11/18/2017):  CHEST: No hypermetabolic mediastinal or hilar nodes. No suspicious pulmonary nodules on the CT scan. ABDOMEN/PELVIS: Large RIGHT renal mass again noted measuring 5.0 by 4.5 cm. Mass has central photopenia and mild peripheral metabolic activity with SUV max equal 3.1 (along the medial border of the mass for example.) This mild activity is similar to liver activity (SUV max equal 3.8). The LEFT adrenal glands normal. No other sites of abnormal FDG uptake.  IMPRESSION: 1. Large RIGHT adrenal mass with central photopenia and mild peripheral metabolic activity. Findings are nonspecific. Recommend surgical consultation for lesions of this size. 2. No evidence of primary or metastatic carcinoma outside of the adrenal gland.  Of note, she did have a normal testosterone, estradiol, and 24-hour urine cortisol level but she had a slightly low ACTH:  Component     Latest Ref Rng & Units 11/10/2017 11/13/2017  Urine Total Volume-UCRE24     mL  2,175  Collection Interval-UCRE24     hours  24  Creatinine, Urine     mg/dL  43.15  Creatinine, 24H Ur     600 - 1,800 mg/day  939  Cortisol (Ur), Free     6 - 42 ug/24 hr  9  Total Volume  2,175  Cortisol,F,ug/L,U     Undefined ug/L  4  Cortisol, Plasma     ug/dL 4.9   Testosterone     3 - 41 ng/dL <3 (L)   Estradiol     pg/mL <5.0   C206 ACTH     7.2 - 63.3 pg/mL 6.5 (L)    She has diarrhea >> has to go to the restroom after every meal - chronic, but worse in last 2 weeks.  She has no hypertensive spell with headache and pallor.  She does have a history of hypertension, though.  No significant truncal weight gain with proximal weakness and wide, purple, striae. No h/o DM.  No history of hypokalemia.  She does mention right upper back pain, but unclear if related to her adrenal mass or the thoracic spine.  Pt. also has a history of vitamin D def, Thrombocytosis.  Of note, reviewing her chart, she also had elevated calcium with a nonsuppressed PTH: Lab Results  Component Value Date   PTH 41 11/10/2017   PTH Comment 11/10/2017   Lab Results  Component Value Date   CALCIUM 10.3 01/27/2018   CALCIUM 10.5 (H) 11/10/2017   CALCIUM 10.4 (H) 10/21/2017   CALCIUM 10.5 (H) 06/02/2017   CALCIUM 10.4 (H) 04/14/2017   CALCIUM 10.4 (H) 12/16/2016   CALCIUM 10.5 (H) 09/24/2016   CALCIUM 10.6 (H) 09/10/2016   CALCIUM 10.2 06/11/2016   CALCIUM 10.3 03/05/2016   CALCIUM 9.8 11/28/2015  CALCIUM 10.3 08/29/2015   CALCIUM 10.1 03/21/2015   CALCIUM 10.4 (H) 11/29/2014   CALCIUM 9.9 05/17/2014   CALCIUM 10.3 02/15/2014   CALCIUM 10.3 07/19/2013   CALCIUM 10.0 03/19/2013   CALCIUM 10.6 (H) 11/22/2012   CALCIUM 10.7 (H) 07/21/2012   She is taking her calcium supplement and Tums for acid reflux.  ROS: Constitutional: no weight gain/loss, + fatigue, + subjective hyperthermia, + poor sleep, + increased urination, + nocturia Eyes: no blurry vision, no xerophthalmia ENT: no sore throat, no nodules palpated in throat, no dysphagia/odynophagia, no hoarseness Cardiovascular: no CP/SOB/palpitations/+ leg swelling Respiratory: no cough/SOB Gastrointestinal: + Heartburn, +  nausea, + diarrhea, + constipation Musculoskeletal: + Muscle and joint aches Skin: + Rash, + hair loss Neurological: no tremors/numbness/tingling/dizziness, + headache Psychiatric: + Both depression/anxiety  Past Medical History:  Diagnosis Date  . Ankle fracture, left 2006  . Diverticula, intestine    Mild case.  Marland Kitchen Hyperlipidemia   . Hypertension     Past Surgical History:  Procedure Laterality Date  . ANKLE SURGERY Left    Broken.  Has screws and metal plate.  . CHOLECYSTECTOMY      Social History   Socioeconomic History  . Marital status: Married    Spouse name: Not on file  . Number of children: Not on file  . Years of education: Not on file  . Highest education level: Not on file  Occupational History  . Not on file  Social Needs  . Financial resource strain: Not on file  . Food insecurity:    Worry: Not on file    Inability: Not on file  . Transportation needs:    Medical: Not on file    Non-medical: Not on file  Tobacco Use  . Smoking status: Never Smoker  . Smokeless tobacco: Never Used  Substance and Sexual Activity  . Alcohol use: No  . Drug use: No  . Sexual activity: Not on file  Lifestyle  . Physical activity:    Days per week: Not on file    Minutes per session: Not on file  . Stress: Not on file  Relationships  . Social connections:    Talks on phone: Not on file    Gets together: Not on file    Attends religious service: Not on file    Active member of club or organization: Not on file    Attends meetings of clubs or organizations: Not on file    Relationship status: Not on file  . Intimate partner violence:    Fear of current or ex partner: Not on file    Emotionally abused: Not on file    Physically abused: Not on file    Forced sexual activity: Not on file  Other Topics Concern  . Not on file  Social History Narrative  . Not on file    Current Outpatient Medications on File Prior to Visit  Medication Sig Dispense Refill  .  ALPRAZolam (XANAX) 0.25 MG tablet Take 1 tablet (0.25 mg total) by mouth at bedtime. 30 tablet 2  . amLODipine (NORVASC) 5 MG tablet TAKE 1 TABLET EVERY DAY 90 tablet 1  . baclofen (LIORESAL) 10 MG tablet TAKE 1 TABLET (10 MG TOTAL) BY MOUTH AT BEDTIME AS NEEDED FOR MUSCLE SPASMS. 60 tablet 0  . Cholecalciferol (VITAMIN D-1000 MAX ST) 1000 units tablet Take by mouth.    . DULoxetine (CYMBALTA) 60 MG capsule Take 1 capsule (60 mg total) by mouth daily. 90 capsule 1  .  enalapril (VASOTEC) 20 MG tablet TAKE 1 AND 1/2 TABLETS EVERY DAY 135 tablet 1  . fluticasone (FLONASE) 50 MCG/ACT nasal spray USE 2 SPRAYS IN EACH NOSTRIL EVERY DAY 48 g 5  . hydrochlorothiazide (HYDRODIURIL) 25 MG tablet TAKE 1 TABLET EVERY DAY 90 tablet 1  . hyoscyamine (ANASPAZ) 0.125 MG TBDP disintergrating tablet Take 1 tablet (0.125 mg total) by mouth every 6 (six) hours as needed. 30 tablet 4  . loratadine (CLARITIN) 10 MG tablet Take 10 mg by mouth daily.    . meloxicam (MOBIC) 15 MG tablet TAKE 1 TABLET EVERY DAY 90 tablet 0  . Omega-3 Fatty Acids (FISH OIL) 1000 MG CAPS Take 2,000 mg by mouth.     . ondansetron (ZOFRAN ODT) 4 MG disintegrating tablet Take 1 tablet (4 mg total) by mouth every 8 (eight) hours as needed for nausea or vomiting. 30 tablet 2  . pantoprazole (PROTONIX) 40 MG tablet Take 1 tablet (40 mg total) by mouth daily. 30 tablet 5  . Probiotic Product (PROBIOTIC & ACIDOPHILUS EX ST PO) Take by mouth.    . traMADol (ULTRAM) 50 MG tablet Take 1 tablet (50 mg total) by mouth 3 (three) times daily as needed. 90 tablet 1   No current facility-administered medications on file prior to visit.     Allergies  Allergen Reactions  . Codeine Nausea And Vomiting  . Sulfa Antibiotics     Family History  Problem Relation Age of Onset  . Heart disease Mother        CHF  . Cancer Mother        Uterine  . Osteoporosis Mother   . COPD Sister   . Asthma Sister   . Migraines Sister     PE: BP 140/82   Pulse  87   Ht 5\' 1"  (1.549 m)   Wt 147 lb (66.7 kg)   SpO2 99%   BMI 27.78 kg/m  Wt Readings from Last 3 Encounters:  01/31/18 147 lb (66.7 kg)  01/27/18 144 lb 12.8 oz (65.7 kg)  01/10/18 149 lb (67.6 kg)   Constitutional: normal weight, in NAD Eyes: PERRLA, EOMI, no exophthalmos, no lid lag, no stare ENT: moist mucous membranes, no thyromegaly, no cervical lymphadenopathy Cardiovascular: RRR, No MRG Respiratory: CTA B Gastrointestinal: abdomen soft, NT, ND, BS+ Musculoskeletal: no deformities, strength intact in all 4 Skin: moist, warm, no rashes Neurological: no tremor with outstretched hands, DTR normal in all 4  ASSESSMENT: 1. Adrenal incidentaloma  2.  Hypercalcemia  PLAN:  1. Patient with a right adrenal nodule discovered incidentally.  This measures 5 x 4.5 cm with necrotic areas along with scattered areas of calcification.  Per review of her CT abdomen report, this may indicate adrenal cortical cancer versus metastasis, however, the PET/CT did not show increased FDG activity to suggest malignancy.  However, the finding was nonspecific. - We discussed about the fact that there are 3 possible scenarios: - A nonfunctioning adrenal nodule - A functioning adrenal adenoma - which can hypersecrete catecholamines/metanephrines, cortisol, or aldosterone - Adrenal cancer/metastasis The best indicator for benignity is the lack of change in size and appearance over time.  However, this mass is enlarged and I agree with Dr. Rosendo Gros that it is best resected.  Before she can have this safely removed, we need to rule out excessive hormone production. - To differentiate between a functioning and a nonfunctioning adrenal nodule, we'll need to rule out hypersecretion by checking the following tests  - dexamethasone suppression  test to rule out Cushing syndrome (6% of adrenal incidentalomas) -of note, she did have a 24-hour urine cortisol which I reviewed and was normal.  A cortisol level was normal  while an ACTH level was slightly low.  This may be related to dehydration if the sample was not transported frozen, however, we will check a dexamethasone suppression test to further clarify. - Plasma fractionated metanephrines and catecholamines to rule out pheochromocytoma (3% of adrenal incidentalomas)- we will order this today - Plasma renin activity and aldosterone level to rule out primary hyperaldosteronism (0.6% of adrenal incidentalomas)-we will order this today - I ordered the above tests today. I advised pt to take the dexamethasone tablets (1 mg total dose, sent to pharmacy) at 11 PM the night before coming to the lab to have a cortisol level drawn. I also added dexamethasone level. - If she does not have the mass resected, the recommendation is for a repeat CT scan probably in 6 months - I explained all the above to the patient, and she agrees with the plan. - I will see her back in 3 months  2.  Hypercalcemia -Reviewed patient most recent calcium levels which have been elevated.  However, she is taking a calcium supplement and I advised her to stop this before we investigate her parathyroid function.  I also advised her to restart her vitamin D, which she stopped recently.  She was using a higher dose, 600 units daily, but I advised her to start 4000 units daily. - Of note she did have a normal PTH, however, this was inappropriately normal in the setting of a high calcium.  We need to address this after the adrenal surgery.  There is also concern for possible MEN syndrome.  She does not have a history of this syndrome in her family, or other pituitary, parathyroid, or adrenal issues.  Component     Latest Ref Rng & Units 01/31/2018 02/03/2018  ALDOSTERONE      ng/dL <1   Renin Activity     0.25 - 5.82 ng/mL/h 9.86 (H)   ALDO / PRA Ratio      see note   Dexamethasone, Serum     ng/dL  >1,000  Cortisol - AM     mcg/dL  2.2 (L)  No sign of hyperaldosteronism.  Appropriate suppression  of cortisol post dexamethasone stimulation >> no evidence of Cushing syndrome.  Component     Latest Ref Rng & Units 01/31/2018  Epinephrine     pg/mL 34  Norepinephrine     pg/mL 1,073  Dopamine     pg/mL 58  Catecholamines, Total     pg/mL 1,107  Metanephrine, Pl     <=57 pg/mL <25  Normetanephrine, Pl     <=148 pg/mL 232 (H)  Total Metanephrines-Plasma     <=205 pg/mL 232 (H)  Potassium     3.5 - 5.1 mEq/L 4.3  Nonspecific increase in normetanephrine.  This is quite mild.  However, I will ask her to collect a 24-hour urine for metanephrines (a more specific measurement).  Component     Latest Ref Rng & Units 02/16/2018 02/16/2018        11:51 AM 11:51 AM  Volume, Urine-VMAUR     mL 2,200 2,200  Epinephrine, 24 hr Urine      see note   Norepinephrine, 24 hr Ur     15 - 100 mcg/24 h 8 (L)   Calculated Total (E+NE)     26 -  121 mcg/24 h 8 (L)   Dopamine, 24 hr Urine      see note   Creatinine, Urine mg/day-CATEUR     0.50 - 2.15 g/24 h 0.77   Metanephrines, Ur     90 - 315 mcg/24 h  97  Normetanephr.,U,24h     122 - 676 mcg/24 h  407  Metanephrine, Ur     224 - 832 mcg/24 h  504  Creatinine, 24H Ur     0.50 - 2.15 g/24 h 0.79    24h Urine catecholamines and metanephrines are normal. Therefore, no pheochromocytoma evidence.  Therefore, she is cleared for adrenal surgery.  Philemon Kingdom, MD PhD [facility]

## 2018-01-31 NOTE — Patient Instructions (Addendum)
Please try to stop calcium.  Please restart vitamin D - 4000 units daily.  Take Dexamethasone at 10-11 pm the night before coming for labs at 8 am, fasting.  Please come back for a follow-up appointment in 3 months.   Adrenalectomy Adrenal glands are organs that make certain hormones that your body needs to function. You have two adrenal glands, one above each kidney. An adrenalectomy is a surgery to remove an adrenal gland. You may need this surgery if an adrenal gland is making too much hormone or if you have a tumor on your adrenal gland. There are two kinds of adrenalectomy:  Laparoscopic. This kind is done through small surgical cuts (incisions) with the help of a lighted, pencil-sized instrument (laparoscope).  Open. This kind is done through a larger incision. Tell a health care provider about:  Any allergies you have.  All medicines you are taking, including vitamins, herbs, eye drops, creams, and over-the-counter medicines.  Any problems you or family members have had with anesthetic medicines.  Any blood disorders you have.  Any surgeries you have had.  Any medical conditions you have.  Whether you are pregnant or may be pregnant. What are the risks? Generally, this is a safe procedure. However, problems may occur, including:  Infection.  Bleeding.  Allergic reactions to medicines.  Injury to other organs.  High blood pressure (hypertension). What happens before the procedure?  Follow instructions from your health care provider about eating or drinking restrictions.  Ask your health care provider about: ? Changing or stopping your regular medicines. This is especially important if you are taking diabetes medicines or blood thinners. ? Taking medicines such as aspirin and ibuprofen. These medicines can thin your blood. Do not take these medicines before your procedure if your health care provider instructs you not to.  Do not use tobacco products, including  cigarettes, chewing tobacco, or e-cigarettes. If you need help quitting, ask your health care provider. Quitting will improve your body's ability to heal after surgery.  Your health care provider may do blood tests. These tests will check to make sure that your blood can clot normally.  Ask your health care provider how your surgical site will be marked or identified.  You may be given antibiotics to help prevent infection. What happens during the procedure?  To reduce your risk of infection: ? Your health care team will wash or sanitize their hands. ? Your skin will be washed with soap.  An IV tube will be inserted into one of your veins. You will receive fluids and medicines through this tube during the procedure.  You may be given a medicine to help you relax (sedative).  You will be given a medicine to make you fall asleep (general anesthetic).  A thin, flexible tube (catheter) may be put into your bladder to drain urine.  A tube may be passed through your nose or mouth and into your stomach (NG tube, or nasogastric tube). This tube removes digestive juices to keep you from feeling nauseous and vomiting.  If you are having laparoscopic adrenalectomy: ? Your surgeon will make 2-4 small incisions in your abdomen. ? Air will be pumped into your abdomen to help your so that your surgeon will be able to see the area clearly. ? A laparoscope and other small surgical instruments will be put through the incisions.  If you are having an open adrenalectomy, your surgeon will make a large incision under your rib cage, in the middle of your  abdomen, or along your side.  The blood vessels that lead to the adrenal gland(s) will be tied off or clipped to prevent bleeding.  The adrenal gland(s) will be removed.  Nearby organs will be checked.  Your abdomen will be rinsed with a solution.  Your muscles will be stitched (sutured) back together.  The incision(s) will be sutured or  stapled.  A bandage (dressing) will be used to cover the incision(s).  If a tube was inserted into your bladder or stomach, it will be removed. The procedure may vary among health care providers and hospitals. What happens after the procedure?  Your blood pressure, heart rate, breathing rate, and blood oxygen level will be monitored often until the medicines you were given have worn off.  You will be given pain medicines as needed.  You may be started on some new medicines. For example, if you had this procedure because an adrenal tumor was making too much of a hormone called cortisol, you may need to take prednisone or cortisol pills after surgery.  If you had a tube down your throat, your throat may feel sore.  Do not drive for 24 hours if you received a sedative. This information is not intended to replace advice given to you by your health care provider. Make sure you discuss any questions you have with your health care provider. Document Released: 04/07/2010 Document Revised: 05/29/2015 Document Reviewed: 06/10/2014 Elsevier Interactive Patient Education  2019 [employer].

## 2018-02-03 ENCOUNTER — Encounter (HOSPITAL_COMMUNITY): Payer: Self-pay | Admitting: Hematology

## 2018-02-03 ENCOUNTER — Telehealth: Payer: Self-pay | Admitting: Family Medicine

## 2018-02-03 ENCOUNTER — Inpatient Hospital Stay (HOSPITAL_COMMUNITY): Payer: Medicare HMO | Attending: Hematology | Admitting: Hematology

## 2018-02-03 ENCOUNTER — Other Ambulatory Visit (INDEPENDENT_AMBULATORY_CARE_PROVIDER_SITE_OTHER): Payer: Medicare HMO

## 2018-02-03 VITALS — BP 153/83 | HR 112 | Temp 98.2°F | Resp 16 | Wt 145.0 lb

## 2018-02-03 DIAGNOSIS — R7989 Other specified abnormal findings of blood chemistry: Secondary | ICD-10-CM | POA: Diagnosis not present

## 2018-02-03 DIAGNOSIS — I10 Essential (primary) hypertension: Secondary | ICD-10-CM | POA: Insufficient documentation

## 2018-02-03 DIAGNOSIS — E279 Disorder of adrenal gland, unspecified: Secondary | ICD-10-CM | POA: Insufficient documentation

## 2018-02-03 DIAGNOSIS — E278 Other specified disorders of adrenal gland: Secondary | ICD-10-CM

## 2018-02-03 DIAGNOSIS — R197 Diarrhea, unspecified: Secondary | ICD-10-CM | POA: Diagnosis not present

## 2018-02-03 DIAGNOSIS — Z79899 Other long term (current) drug therapy: Secondary | ICD-10-CM | POA: Insufficient documentation

## 2018-02-03 NOTE — Patient Instructions (Signed)
[place] at [facility] Discharge Instructions  Follow up after your surgery. We will make you a follow up for two months to give your time for your surgery.    Thank you for choosing [place] at [facility] to provide your oncology and hematology care.  To afford each patient quality time with our provider, please arrive at least 15 minutes before your scheduled appointment time.   If you have a lab appointment with the [facility] please come in thru the  Main Entrance and check in at the main information desk  You need to re-schedule your appointment should you arrive 10 or more minutes late.  We strive to give you quality time with our providers, and arriving late affects you and other patients whose appointments are after yours.  Also, if you no show three or more times for appointments you may be dismissed from the clinic at the providers discretion.     Again, thank you for choosing [facility].  Our hope is that these requests will decrease the amount of time that you wait before being seen by our physicians.       _____________________________________________________________  Should you have questions after your visit to [facility], please contact our office at [phone] between the hours of 8:00 a.m. and 4:30 p.m.  Voicemails left after 4:00 p.m. will not be returned until the following business day.  For prescription refill requests, have your pharmacy contact our office and allow 72 hours.    Cancer Center Support Programs:   > Cancer Support Group  2nd Tuesday of the month 1pm-2pm, Journey Room

## 2018-02-03 NOTE — Progress Notes (Signed)
Maria Moss, [place] [address]   CLINIC:  Medical Oncology/Hematology  PCP:  Dettinger, Fransisca Kaufmann, MD [place] [id number] [phone]   REASON FOR VISIT: Follow-up for right adrenal mass  CURRENT THERAPY: work up for surgery.    INTERVAL HISTORY:  Maria Moss 68 y.o. female returns for routine follow-up right adrenal mass. She is here today with her husband. She is doing well. She will be scheduled for her right adrenal mass removal soon she is waiting for a phone call from them. She reports she fell a few weeks ago and hit her head. She had two black eyes. It is healing now and looks much better. She denies any headaches or vision changes. She has also had watery diarrhea daily for the past two weeks. Denies any nausea or vomiting. Denies any new pains. Had not noticed any recent bleeding such as epistaxis, hematuria or hematochezia. Denies recent chest pain on exertion, shortness of breath on minimal exertion, pre-syncopal episodes, or palpitations. Denies any numbness or tingling in hands or feet. Denies any recent fevers, infections, or recent hospitalizations. Patient reports appetite at 75% and energy level at 75%.    REVIEW OF SYSTEMS:  Review of Systems  Gastrointestinal: Positive for diarrhea and nausea.  Psychiatric/Behavioral: Positive for sleep disturbance.  All other systems reviewed and are negative.    PAST MEDICAL/SURGICAL HISTORY:  Past Medical History:  Diagnosis Date  . Ankle fracture, left 2006  . Diverticula, intestine    Mild case.  Marland Kitchen Hyperlipidemia   . Hypertension    Past Surgical History:  Procedure Laterality Date  . ANKLE SURGERY Left    Broken.  Has screws and metal plate.  . CHOLECYSTECTOMY       SOCIAL HISTORY:  Social History   Socioeconomic History  . Marital status: Married    Spouse name: Not on file  . Number of children: Not on file  . Years of education: Not on file  . Highest  education level: Not on file  Occupational History  . Not on file  Social Needs  . Financial resource strain: Not on file  . Food insecurity:    Worry: Not on file    Inability: Not on file  . Transportation needs:    Medical: Not on file    Non-medical: Not on file  Tobacco Use  . Smoking status: Never Smoker  . Smokeless tobacco: Never Used  Substance and Sexual Activity  . Alcohol use: No  . Drug use: No  . Sexual activity: Not on file  Lifestyle  . Physical activity:    Days per week: Not on file    Minutes per session: Not on file  . Stress: Not on file  Relationships  . Social connections:    Talks on phone: Not on file    Gets together: Not on file    Attends religious service: Not on file    Active member of club or organization: Not on file    Attends meetings of clubs or organizations: Not on file    Relationship status: Not on file  . Intimate partner violence:    Fear of current or ex partner: Not on file    Emotionally abused: Not on file    Physically abused: Not on file    Forced sexual activity: Not on file  Other Topics Concern  . Not on file  Social History Narrative  . Not on file  FAMILY HISTORY:  Family History  Problem Relation Age of Onset  . Heart disease Mother        CHF  . Cancer Mother        Uterine  . Osteoporosis Mother   . COPD Sister   . Asthma Sister   . Migraines Sister     CURRENT MEDICATIONS:  Outpatient Encounter Medications as of 02/03/2018  Medication Sig Note  . ALPRAZolam (XANAX) 0.25 MG tablet Take 1 tablet (0.25 mg total) by mouth at bedtime.   Marland Kitchen amLODipine (NORVASC) 5 MG tablet TAKE 1 TABLET EVERY DAY   . baclofen (LIORESAL) 10 MG tablet TAKE 1 TABLET (10 MG TOTAL) BY MOUTH AT BEDTIME AS NEEDED FOR MUSCLE SPASMS.   . Cholecalciferol (VITAMIN D-1000 MAX ST) 1000 units tablet Take by mouth. 10/17/2015: Received from: [facility] Received Sig: Take 1,000 Units by mouth daily.  . DULoxetine  (CYMBALTA) 60 MG capsule Take 1 capsule (60 mg total) by mouth daily.   . enalapril (VASOTEC) 20 MG tablet TAKE 1 AND 1/2 TABLETS EVERY DAY   . fluticasone (FLONASE) 50 MCG/ACT nasal spray USE 2 SPRAYS IN EACH NOSTRIL EVERY DAY   . hydrochlorothiazide (HYDRODIURIL) 25 MG tablet TAKE 1 TABLET EVERY DAY   . hyoscyamine (ANASPAZ) 0.125 MG TBDP disintergrating tablet Take 1 tablet (0.125 mg total) by mouth every 6 (six) hours as needed.   . meloxicam (MOBIC) 15 MG tablet TAKE 1 TABLET EVERY DAY   . Omega-3 Fatty Acids (FISH OIL) 1000 MG CAPS Take 2,000 mg by mouth.  10/17/2015: Received from: [facility] Received Sig: Take 2 capsules by mouth every morning.  . pantoprazole (PROTONIX) 40 MG tablet Take 1 tablet (40 mg total) by mouth daily.   . Probiotic Product (PROBIOTIC & ACIDOPHILUS EX ST PO) Take by mouth.   . traMADol (ULTRAM) 50 MG tablet Take 1 tablet (50 mg total) by mouth 3 (three) times daily as needed.   . loratadine (CLARITIN) 10 MG tablet Take 10 mg by mouth daily.   . ondansetron (ZOFRAN ODT) 4 MG disintegrating tablet Take 1 tablet (4 mg total) by mouth every 8 (eight) hours as needed for nausea or vomiting. (Patient not taking: Reported on 02/03/2018)   . [DISCONTINUED] dexamethasone (DECADRON) 1 MG tablet Take 1 tablet by mouth once at 11 pm, before coming for labs at 8 am the next morning   . [DISCONTINUED] dexamethasone (DECADRON) 4 MG tablet     No facility-administered encounter medications on file as of 02/03/2018.     ALLERGIES:  Allergies  Allergen Reactions  . Codeine Nausea And Vomiting  . Sulfa Antibiotics      PHYSICAL EXAM:  ECOG Performance status: 1  Vitals:   02/03/18 1117  BP: (!) 153/83  Pulse: (!) 112  Resp: 16  Temp: 98.2 F (36.8 C)  SpO2: 98%   Filed Weights   02/03/18 1117  Weight: 145 lb (65.8 kg)    Physical Exam Constitutional:      Appearance: Normal appearance. She is normal weight.  Musculoskeletal: Normal  range of motion.  Skin:    General: Skin is warm and dry.  Neurological:     Mental Status: She is alert and oriented to person, place, and time. Mental status is at baseline.  Psychiatric:        Mood and Affect: Mood normal.        Behavior: Behavior normal.  Thought Content: Thought content normal.        Judgment: Judgment normal.      LABORATORY DATA:  I have reviewed the labs as listed.  CBC    Component Value Date/Time   WBC 8.6 01/27/2018 1040   WBC 8.6 11/02/2013 1308   RBC 3.92 01/27/2018 1040   RBC 4.4 11/02/2013 1308   HGB 10.8 (L) 01/27/2018 1040   HCT 32.5 (L) 01/27/2018 1040   PLT 561 (H) 01/27/2018 1040   MCV 83 01/27/2018 1040   MCH 27.6 01/27/2018 1040   MCH 28.2 11/02/2013 1308   MCHC 33.2 01/27/2018 1040   MCHC 32.9 11/02/2013 1308   RDW 13.1 01/27/2018 1040   LYMPHSABS 2.0 01/27/2018 1040   EOSABS 0.3 01/27/2018 1040   BASOSABS 0.0 01/27/2018 1040   CMP Latest Ref Rng & Units 01/31/2018 01/27/2018 11/10/2017  Glucose 65 - 99 mg/dL - 94 -  BUN 8 - 27 mg/dL - 22 -  Creatinine 0.57 - 1.00 mg/dL - 1.13(H) -  Sodium 134 - 144 mmol/L - 141 -  Potassium 3.5 - 5.1 mEq/L 4.3 4.5 -  Chloride 96 - 106 mmol/L - 100 -  CO2 20 - 29 mmol/L - 24 -  Calcium 8.7 - 10.3 mg/dL - 10.3 10.5(H)  Total Protein 6.0 - 8.5 g/dL - 6.5 -  Total Bilirubin 0.0 - 1.2 mg/dL - 0.7 -  Alkaline Phos 39 - 117 IU/L - 148(H) -  AST 0 - 40 IU/L - 26 -  ALT 0 - 32 IU/L - 38(H) -       DIAGNOSTIC IMAGING:  I have independently reviewed the scans and discussed with the patient.   I have reviewed Francene Finders, NP's note and agree with the documentation.  I personally performed a face-to-face visit, made revisions and my assessment and plan is as follows.    ASSESSMENT & PLAN:   Mass of right adrenal gland ([place]) 1.  Right adrenal mass: - Patient has developed right thigh pain and difficulty walking in the first week of August, tried prednisone and PT without  improvement. - She was evaluated by Dr. Lorin Mercy and was given a "shot" without improvement.  She underwent MRI of the lumbar spine without contrast on 10/28/2017 which showed 5.2 x 4.4 cm right renal versus suprarenal heterogeneous soft tissue mass.  L3-L4 disc bulge and L4-L5 disc bulge were seen. -CT scan of the abdomen and pelvis was done on 11/04/2017 which showed a large complex right adrenal gland mass which appears largely cystic or necrotic but demonstrates nodular areas of contrast-enhancement along with scattered calcifications.  Findings suspicious for adrenal cortical carcinoma.  There is a small exophytic nodular lesion projecting off the tail region of the pancreas, measuring 7.5 mm. - Patient denies any weight loss, fevers or night sweats.  She does have hot flashes. -She was a never smoker.  She worked in [employer] and denies any exposure to chemicals. -Family history significant for mother with uterine cancer in her 9s.  Maternal uncle had metastatic cancer to the bones.  Another maternal uncle had melanoma. -She does not have any clinical features of Cushing syndrome or primary aldosteronism.  She does have a right thyroid nodule palpable in the neck without adenopathy. - Last colonoscopy was in 2015 which showed mild diverticulosis.  Mammogram was on 02/28/2017 which was BI-RADS 2. - PET CT scan on 11/18/2017 shows right adrenal mass measuring 5 x 4.5 cm.  There is central photopenia with  mild peripheral metabolic activity with SUV of 3.1.  This is similar to liver activity.  Small nodule along the tail of the pancreas is metabolically not active. -She was seen by Dr. Rosendo Gros and a referral was made to endocrinology. - She was evaluated by endocrinology and a dexamethasone suppression test was done this morning. - She reportedly had diarrhea which lasted couple of weeks.  It subsided a week ago. -I will schedule a follow-up appointment with me in 2 months, hoping that surgery  will be done by that time.  We will go over pathology report.  2.  Mild hypercalcemia: - She has mild hypercalcemia for several years.  PTH was 41. -She has discontinued calcium supplements.  3.  Thrombocytosis: - She has mildly elevated platelet count since August 2017.  Likely reactive. -If they remain high after surgical resection, will consider checking for myeloproliferative disorders.        Orders placed this encounter:  Orders Placed This Encounter  Procedures  . CBC with Differential/Platelet  . Comprehensive metabolic panel      Derek Jack, MD [place] [phone]

## 2018-02-03 NOTE — Telephone Encounter (Signed)
Aware of results. 

## 2018-02-03 NOTE — Assessment & Plan Note (Signed)
1.  Right adrenal mass: - Patient has developed right thigh pain and difficulty walking in the first week of August, tried prednisone and PT without improvement. - She was evaluated by Dr. Lorin Mercy and was given a "shot" without improvement.  She underwent MRI of the lumbar spine without contrast on 10/28/2017 which showed 5.2 x 4.4 cm right renal versus suprarenal heterogeneous soft tissue mass.  L3-L4 disc bulge and L4-L5 disc bulge were seen. -CT scan of the abdomen and pelvis was done on 11/04/2017 which showed a large complex right adrenal gland mass which appears largely cystic or necrotic but demonstrates nodular areas of contrast-enhancement along with scattered calcifications.  Findings suspicious for adrenal cortical carcinoma.  There is a small exophytic nodular lesion projecting off the tail region of the pancreas, measuring 7.5 mm. - Patient denies any weight loss, fevers or night sweats.  She does have hot flashes. -She was a never smoker.  She worked in [employer] and denies any exposure to chemicals. -Family history significant for mother with uterine cancer in her 62s.  Maternal uncle had metastatic cancer to the bones.  Another maternal uncle had melanoma. -She does not have any clinical features of Cushing syndrome or primary aldosteronism.  She does have a right thyroid nodule palpable in the neck without adenopathy. - Last colonoscopy was in 2015 which showed mild diverticulosis.  Mammogram was on 02/28/2017 which was BI-RADS 2. - PET CT scan on 11/18/2017 shows right adrenal mass measuring 5 x 4.5 cm.  There is central photopenia with mild peripheral metabolic activity with SUV of 3.1.  This is similar to liver activity.  Small nodule along the tail of the pancreas is metabolically not active. -She was seen by Dr. Rosendo Gros and a referral was made to endocrinology. - She was evaluated by endocrinology and a dexamethasone suppression test was done this morning. - She reportedly  had diarrhea which lasted couple of weeks.  It subsided a week ago. -I will schedule a follow-up appointment with me in 2 months, hoping that surgery will be done by that time.  We will go over pathology report.  2.  Mild hypercalcemia: - She has mild hypercalcemia for several years.  PTH was 41. -She has discontinued calcium supplements.  3.  Thrombocytosis: - She has mildly elevated platelet count since August 2017.  Likely reactive. -If they remain high after surgical resection, will consider checking for myeloproliferative disorders.

## 2018-02-06 ENCOUNTER — Telehealth: Payer: Self-pay

## 2018-02-06 NOTE — Telephone Encounter (Signed)
Left message for patient to return our call at [phone].  

## 2018-02-06 NOTE — Telephone Encounter (Signed)
-----   Message from Philemon Kingdom, MD sent at 02/03/2018  5:37 PM EST ----- Lenna Sciara, can you please call pt: one of the adrenal test is slightly high.  This is nonspecific, but I would suggest to obtain a 24-hour urine to check this hormone in urine.  I will insert below instructions for the urine collection.  I believe Kieth Brightly has them also. Patient information (Up-to-Date): Collection of a 24-hour urine specimen   - You should collect every drop of urine during each 24-hour period. It does not matter how much or little urine is passed each time, as long as every drop is collected. - Begin the urine collection in the morning after you wake up, after you have emptied your bladder for the first time. - Urinate (empty the bladder) for the first time and flush it down the toilet. Note the exact time (eg, 6:15 AM). You will begin the urine collection at this time. - Collect every drop of urine during the day and night in an empty collection bottle. Store the bottle at room temperature or in the refrigerator. - If you need to have a bowel movement, any urine passed with the bowel movement should be collected. Try not to include feces with the urine collection. If feces does get mixed in, do not try to remove the feces from the urine collection bottle. - Finish by collecting the first urine passed the next morning, adding it to the collection bottle. This should be within ten minutes before or after the time of the first morning void on the first day (which was flushed). In this example, you would try to void between 6:05 and 6:25 on the second day. - If you need to urinate one hour before the final collection time, drink a full glass of water so that you can void again at the appropriate time. If you have to urinate 20 minutes before, try to hold the urine until the proper time. - Please note the exact time of the final collection, even if it is not the same time as when collection began on day 1. - The  bottle(s) may be kept at room temperature for a day or two, but should be kept cool or refrigerated for longer periods of time.

## 2018-02-07 ENCOUNTER — Telehealth: Payer: Self-pay

## 2018-02-07 NOTE — Telephone Encounter (Signed)
-----   Message from Philemon Kingdom, MD sent at 02/06/2018 12:48 PM EST ----- Lenna Sciara, can you please call pt: Her cortisol test was excellent.  No signs of Cushing syndrome.

## 2018-02-07 NOTE — Telephone Encounter (Signed)
Patient is returning call. Please Advise, thanks

## 2018-02-07 NOTE — Telephone Encounter (Signed)
Notified patient of message from Dr. Gherghe, patient expressed understanding and agreement. No further questions.  

## 2018-02-08 LAB — CATECHOLAMINES, FRACTIONATED, PLASMA
CATECHOLAMINES, TOTAL: 1107 pg/mL
Dopamine: 58 pg/mL
Epinephrine: 34 pg/mL
Norepinephrine: 1073 pg/mL

## 2018-02-08 LAB — METANEPHRINES, PLASMA
Metanephrine, Free: <25 pg/mL (ref <=57)
Normetanephrine, Free: 232 pg/mL — ABNORMAL HIGH (ref <=148)
Total Metanephrines-Plasma: 232 pg/mL — ABNORMAL HIGH (ref <=205)

## 2018-02-08 LAB — ALDOSTERONE + RENIN ACTIVITY W/ RATIO
Aldosterone: <1 ng/dL
Renin Activity: 9.86 ng/mL/h — ABNORMAL HIGH (ref 0.25–5.82)

## 2018-02-08 NOTE — Telephone Encounter (Signed)
Notified patient of message from Dr. Gherghe, patient expressed understanding and agreement. No further questions.  

## 2018-02-14 LAB — CORTISOL-AM, BLOOD: Cortisol - AM: 2.2 ug/dL — ABNORMAL LOW

## 2018-02-14 LAB — DEXAMETHASONE, BLOOD: Dexamethasone, Serum: >1000 ng/dL

## 2018-02-16 ENCOUNTER — Other Ambulatory Visit: Payer: Medicare HMO

## 2018-02-16 DIAGNOSIS — E278 Other specified disorders of adrenal gland: Secondary | ICD-10-CM

## 2018-02-22 ENCOUNTER — Telehealth: Payer: Self-pay | Admitting: Internal Medicine

## 2018-02-22 ENCOUNTER — Other Ambulatory Visit: Payer: Self-pay | Admitting: Internal Medicine

## 2018-02-22 DIAGNOSIS — E278 Other specified disorders of adrenal gland: Secondary | ICD-10-CM

## 2018-02-22 NOTE — Telephone Encounter (Signed)
Patient is calling to see if her lab results have come back yet.

## 2018-02-23 ENCOUNTER — Other Ambulatory Visit: Payer: Self-pay | Admitting: *Deleted

## 2018-02-23 DIAGNOSIS — R5383 Other fatigue: Secondary | ICD-10-CM

## 2018-02-23 DIAGNOSIS — G2581 Restless legs syndrome: Secondary | ICD-10-CM

## 2018-02-23 MED ORDER — MELOXICAM 15 MG PO TABS: 15.0000 mg | ORAL_TABLET | Freq: Every day | ORAL | 0 refills | Status: DC

## 2018-02-23 MED ORDER — BACLOFEN 10 MG PO TABS: 10.0000 mg | ORAL_TABLET | Freq: Every evening | ORAL | 0 refills | Status: DC | PRN

## 2018-02-25 LAB — METANEPHRINES, URINE, 24 HOUR
Metaneph Total, Ur: 504 mcg/24 h (ref 224–832)
Metanephrines, Ur: 97 mcg/24 h (ref 90–315)
Normetanephrine, 24H Ur: 407 mcg/24 h (ref 122–676)
Volume, Urine-VMAUR: 2200 mL

## 2018-02-25 LAB — TEST AUTHORIZATION

## 2018-02-25 LAB — CATECHOLAMINES, FRACTIONATED, URINE, 24 HOUR
Calculated Total (E+NE): 8 mcg/24 h — ABNORMAL LOW (ref 26–121)
Creatinine, Urine mg/day-CATEUR: 0.77 g/(24.h) (ref 0.50–2.15)
Norepinephrine, 24 hr Ur: 8 mcg/24 h — ABNORMAL LOW (ref 15–100)
Volume, Urine-VMAUR: 2200 mL

## 2018-02-25 LAB — CREATININE, URINE, 24 HOUR: Creatinine, 24H Ur: 0.79 g/(24.h) (ref 0.50–2.15)

## 2018-02-27 DIAGNOSIS — J019 Acute sinusitis, unspecified: Secondary | ICD-10-CM | POA: Diagnosis not present

## 2018-02-27 DIAGNOSIS — B9689 Other specified bacterial agents as the cause of diseases classified elsewhere: Secondary | ICD-10-CM | POA: Diagnosis not present

## 2018-02-27 DIAGNOSIS — H6692 Otitis media, unspecified, left ear: Secondary | ICD-10-CM | POA: Diagnosis not present

## 2018-02-27 DIAGNOSIS — J020 Streptococcal pharyngitis: Secondary | ICD-10-CM | POA: Diagnosis not present

## 2018-03-01 ENCOUNTER — Ambulatory Visit: Payer: Self-pay | Admitting: General Surgery

## 2018-03-01 DIAGNOSIS — E278 Other specified disorders of adrenal gland: Secondary | ICD-10-CM | POA: Diagnosis not present

## 2018-03-09 ENCOUNTER — Telehealth: Payer: Self-pay | Admitting: Family Medicine

## 2018-03-09 ENCOUNTER — Telehealth: Payer: Self-pay

## 2018-03-09 NOTE — Telephone Encounter (Signed)
Yes go ahead and let her know that it is safe to wait, commonly adrenal masses do not metastasize quickly which is what the going in for.

## 2018-03-09 NOTE — Telephone Encounter (Signed)
Patient states that surgery has been pushed off to 05/31/2018 and wants to know if it is safe to wait that long.  Dr. Cruzita Lederer and Dr. Rosendo Gros has stated that it is safe to wait

## 2018-03-09 NOTE — Telephone Encounter (Signed)
Notified patient of message from Dr. Gherghe, patient expressed understanding and agreement. No further questions.  

## 2018-03-09 NOTE — Telephone Encounter (Signed)
A! OK, Thank you!

## 2018-03-09 NOTE — Telephone Encounter (Signed)
The assisting surgeon is unavailable.

## 2018-03-09 NOTE — Telephone Encounter (Signed)
Patient aware.

## 2018-03-09 NOTE — Telephone Encounter (Signed)
Yes, I think this would be okay.  Does she know why the surgery was delayed?

## 2018-03-09 NOTE — Telephone Encounter (Signed)
Patient calling to let us know her surgery for the adrenal mass was scheduled for May 8 however the surgeon has to change it to May 27th.  Patient is just a little worried about pushing it out farther and would like Dr. Cruzita Lederer to advise if it is safe and ok to wait until May 27?

## 2018-03-14 DIAGNOSIS — Z1231 Encounter for screening mammogram for malignant neoplasm of breast: Secondary | ICD-10-CM | POA: Diagnosis not present

## 2018-03-14 LAB — HM MAMMOGRAPHY

## 2018-03-31 ENCOUNTER — Other Ambulatory Visit: Payer: Self-pay | Admitting: Family Medicine

## 2018-03-31 DIAGNOSIS — I10 Essential (primary) hypertension: Secondary | ICD-10-CM

## 2018-04-05 ENCOUNTER — Ambulatory Visit (HOSPITAL_COMMUNITY): Payer: Medicare HMO | Admitting: Hematology

## 2018-04-05 ENCOUNTER — Telehealth (INDEPENDENT_AMBULATORY_CARE_PROVIDER_SITE_OTHER): Payer: Self-pay | Admitting: Radiology

## 2018-04-05 NOTE — Telephone Encounter (Signed)
I called and confirmed patient appointment in [facility] office tomorrow morning at 10am. Patient answered "No" to all COVID-19 questions.

## 2018-04-06 ENCOUNTER — Encounter (INDEPENDENT_AMBULATORY_CARE_PROVIDER_SITE_OTHER): Payer: Self-pay | Admitting: Orthopaedic Surgery

## 2018-04-06 ENCOUNTER — Ambulatory Visit (INDEPENDENT_AMBULATORY_CARE_PROVIDER_SITE_OTHER): Payer: Medicare HMO | Admitting: Orthopaedic Surgery

## 2018-04-06 ENCOUNTER — Other Ambulatory Visit: Payer: Self-pay

## 2018-04-06 VITALS — Ht 61.0 in | Wt 148.0 lb

## 2018-04-06 DIAGNOSIS — M5136 Other intervertebral disc degeneration, lumbar region: Secondary | ICD-10-CM

## 2018-04-06 DIAGNOSIS — E278 Other specified disorders of adrenal gland: Secondary | ICD-10-CM | POA: Diagnosis not present

## 2018-04-06 NOTE — Progress Notes (Signed)
Office Visit Note   Patient: Maria Moss           Date of Birth: 09-Aug-1950           MRN: [id number] Visit Date: 04/06/2018              Requested by: Dettinger, Fransisca Kaufmann, MD [place], [place] [address] PCP: Dettinger, Fransisca Kaufmann, MD   Assessment & Plan: Visit Diagnoses:  1. Mass of right adrenal gland ([place])   2. Degenerative disc disease, lumbar     Plan: Patient has 2 problems one is the adrenal mass which is scheduled for surgery on 5/27.  The other is paracentral disc protrusion with moderate spinal stenosis.  Currently she is having more pain at night and is not having claudication type symptoms.  Some of the pain that radiates down into her thigh to her knee may be related to the L3-4 paracentral disc protrusion MI plan is to check her at least a month after she has the adrenal mass removed and we can reassess her at that time.  We discussed surgical treatment options but she may get significant relief of her pain after the adrenal surgery.  Follow-Up Instructions: Return in about 3 months (around 07/06/2018).   Orders:  No orders of the defined types were placed in this encounter.  No orders of the defined types were placed in this encounter.     Procedures: No procedures performed   Clinical Data: No additional findings.   Subjective: Chief Complaint  Patient presents with  . Right Leg - Pain    HPI 68 year old female returns with ongoing problems with adrenal mass which was scheduled for surgery and surgery had to be delayed and is now scheduled for May 27.  I had seen her for L3-4 moderate spinal stenosis and she continues to have pain primarily at night in her groin region around her hip sometimes radiates to her thigh occasionally she had some pain below her knee but most the time it is in the groin on the right side only.  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  She is been  Dealer with a cane had 1 falling episode.  Right leg has external rotation gait and she has had previous hip x-rays which were negative.  Review of Systems review of systems updated unchanged from 11/03/2017 office visit other than as mentioned above.   Objective: Vital Signs: Ht 5\' 1"  (1.549 m)   Wt 148 lb (67.1 kg)   BMI 27.96 kg/m   Physical Exam Constitutional:      Appearance: She is well-developed.  HENT:     Head: Normocephalic.     Right Ear: External ear normal.     Left Ear: External ear normal.  Eyes:     Pupils: Pupils are equal, round, and reactive to light.  Neck:     Thyroid: No thyromegaly.     Trachea: No tracheal deviation.  Cardiovascular:     Rate and Rhythm: Normal rate.  Pulmonary:     Effort: Pulmonary effort is normal.  Abdominal:     Palpations: Abdomen is soft.  Skin:    General: Skin is warm and dry.  Neurological:     Mental Status: She is alert and oriented to person, place, and time.  Psychiatric:        Behavior: Behavior normal.  Ortho Exam patient ambulates with external rotation gait she has some shortening on the right side but does not use the left.  Previously she had a 3/16 left but has not used in the last few years.  Negative logroll to the hips. Specialty Comments:  No specialty comments available.  Imaging: No results found.   PMFS History: Patient Active Problem List   Diagnosis Date Noted  . Hypercalcemia 01/31/2018  . Mass of right adrenal gland ([place]) 11/10/2017  . Trochanteric bursitis, left hip 06/01/2016  . Pain management contract agreement 11/28/2015  . Encounter for pain management counseling 04/11/2015  . Chronic low back pain 04/11/2015  . BMI 27.0-27.9,adult 11/29/2014  . Diverticulosis 10/24/2013  . Osteopenia 10/24/2013  . Degenerative disc disease, lumbar 07/21/2012  . Hyperlipemia 07/21/2012  . GAD (generalized anxiety disorder) 07/21/2012  . Hypertension 07/21/2012   Past Medical History:   Diagnosis Date  . Ankle fracture, left 2006  . Diverticula, intestine    Mild case.  Marland Kitchen Hyperlipidemia   . Hypertension     Family History  Problem Relation Age of Onset  . Heart disease Mother        CHF  . Cancer Mother        Uterine  . Osteoporosis Mother   . COPD Sister   . Asthma Sister   . Migraines Sister     Past Surgical History:  Procedure Laterality Date  . ANKLE SURGERY Left    Broken.  Has screws and metal plate.  . CHOLECYSTECTOMY     Social History   Occupational History  . Not on file  Tobacco Use  . Smoking status: Never Smoker  . Smokeless tobacco: Never Used  Substance and Sexual Activity  . Alcohol use: No  . Drug use: No  . Sexual activity: Not on file

## 2018-04-24 ENCOUNTER — Other Ambulatory Visit: Payer: Self-pay | Admitting: Family Medicine

## 2018-04-24 DIAGNOSIS — R5383 Other fatigue: Secondary | ICD-10-CM

## 2018-04-24 DIAGNOSIS — G2581 Restless legs syndrome: Secondary | ICD-10-CM

## 2018-04-25 NOTE — Telephone Encounter (Signed)
Last seen:01/27/2018

## 2018-04-27 ENCOUNTER — Other Ambulatory Visit: Payer: Self-pay | Admitting: Family Medicine

## 2018-04-27 DIAGNOSIS — M545 Low back pain, unspecified: Secondary | ICD-10-CM

## 2018-04-27 DIAGNOSIS — G8929 Other chronic pain: Secondary | ICD-10-CM

## 2018-04-28 ENCOUNTER — Encounter: Payer: Self-pay | Admitting: Family Medicine

## 2018-04-28 ENCOUNTER — Ambulatory Visit (INDEPENDENT_AMBULATORY_CARE_PROVIDER_SITE_OTHER): Payer: Medicare HMO | Admitting: Family Medicine

## 2018-04-28 ENCOUNTER — Other Ambulatory Visit: Payer: Self-pay

## 2018-04-28 DIAGNOSIS — M5136 Other intervertebral disc degeneration, lumbar region: Secondary | ICD-10-CM

## 2018-04-28 DIAGNOSIS — M545 Low back pain: Secondary | ICD-10-CM

## 2018-04-28 DIAGNOSIS — Z7189 Other specified counseling: Secondary | ICD-10-CM | POA: Diagnosis not present

## 2018-04-28 DIAGNOSIS — G8929 Other chronic pain: Secondary | ICD-10-CM

## 2018-04-28 DIAGNOSIS — F411 Generalized anxiety disorder: Secondary | ICD-10-CM

## 2018-04-28 DIAGNOSIS — E782 Mixed hyperlipidemia: Secondary | ICD-10-CM

## 2018-04-28 DIAGNOSIS — K219 Gastro-esophageal reflux disease without esophagitis: Secondary | ICD-10-CM

## 2018-04-28 DIAGNOSIS — I10 Essential (primary) hypertension: Secondary | ICD-10-CM

## 2018-04-28 DIAGNOSIS — M51369 Other intervertebral disc degeneration, lumbar region without mention of lumbar back pain or lower extremity pain: Secondary | ICD-10-CM

## 2018-04-28 DIAGNOSIS — Z0289 Encounter for other administrative examinations: Secondary | ICD-10-CM

## 2018-04-28 MED ORDER — PANTOPRAZOLE SODIUM 40 MG PO TBEC: 40.0000 mg | DELAYED_RELEASE_TABLET | Freq: Every day | ORAL | 3 refills | Status: DC

## 2018-04-28 MED ORDER — ALPRAZOLAM 0.25 MG PO TABS: 0.2500 mg | ORAL_TABLET | Freq: Every day | ORAL | 2 refills | Status: DC

## 2018-04-28 MED ORDER — TRAMADOL HCL 50 MG PO TABS: 50.0000 mg | ORAL_TABLET | Freq: Three times a day (TID) | ORAL | 2 refills | Status: DC | PRN

## 2018-04-28 MED ORDER — ENALAPRIL MALEATE 20 MG PO TABS: 30.0000 mg | ORAL_TABLET | Freq: Every day | ORAL | 3 refills | Status: DC

## 2018-04-28 MED ORDER — DULOXETINE HCL 60 MG PO CPEP: 120.0000 mg | ORAL_CAPSULE | Freq: Every day | ORAL | 1 refills | Status: DC

## 2018-04-28 MED ORDER — AMLODIPINE BESYLATE 5 MG PO TABS: 5.0000 mg | ORAL_TABLET | Freq: Every day | ORAL | 3 refills | Status: DC

## 2018-04-28 MED ORDER — HYDROCHLOROTHIAZIDE 25 MG PO TABS: 25.0000 mg | ORAL_TABLET | Freq: Every day | ORAL | 3 refills | Status: DC

## 2018-04-28 MED ORDER — QUETIAPINE FUMARATE 50 MG PO TABS: 50.0000 mg | ORAL_TABLET | Freq: Every day | ORAL | 1 refills | Status: DC

## 2018-04-28 NOTE — Telephone Encounter (Signed)
Last seen 12/08/18

## 2018-04-28 NOTE — Progress Notes (Signed)
Virtual Visit via telephone Note  I connected with Maria Moss on 04/28/18 at [id number] by telephone and verified that I am speaking with the correct person using two identifiers. Garvin Fila Apt is currently located at home and no other people are currently with her during visit. The provider, Fransisca Kaufmann Saydee Zolman, MD is located in their office at time of visit.  Call ended at 0950  I discussed the limitations, risks, security and privacy concerns of performing an evaluation and management service by telephone and the availability of in person appointments. I also discussed with the patient that there may be a patient responsible charge related to this service. The patient expressed understanding and agreed to proceed.   History and Present Illness: Pain assessment: Cause of pain-back pain that radiates down her leg, she does have a spinal surgeon and they are talking about surgery Pain location-low back and down both of her legs Pain on scale of 1-10-3-4 Frequency-Daily and sometimes keeps her up at night What increases pain-any movements or prolonged laying What makes pain Better-the medication and she is hoping the surgery, she has tried shots Effects on ADL -she does have some limited movements and it affects her sleep which makes her groggy through the day Any change in general medical condition-none  Current opioids rx-tramadol 50 3 times daily as needed and also takes Xanax 0.25 nightly as needed # meds rx-tramadol No. 90 and Xanax No. 30 Effectiveness of current meds-Xanax does not seem to be helping anxiety and sleep as much so we will try some different but the tramadol seems to be helping with the 3 times a day quite a lot Adverse reactions form pain meds-none Morphine equivalent- 15  Pill count performed-No Last drug screen -January 2019 ( high risk q17m, moderate risk q72m, low risk yearly ) Urine drug screen today- No Was the [place] reviewed-yes  If yes were their any concerning  findings? -No  Hypertension Patient is currently on amlodipine and enalapril and hydrochlorothiazide, and their blood pressure today is unknown because she has not been checking it but she does have a blood pressure cuff and will check it over the next couple weeks. Patient denies any lightheadedness or dizziness. Patient denies headaches, blurred vision, chest pains, shortness of breath, or weakness. Denies any side effects from medication and is content with current medication.   Hyperlipidemia Patient is coming in for recheck of his hyperlipidemia. The patient is currently taking fish oil and. They deny any issues with myalgias or history of liver damage from it. They deny any focal numbness or weakness or chest pain.   Insomnia and anxiety Patient is not sleeping well at night because of anxiety and also because of her back pain.  She has been using Xanax but is sometimes been doubling up on it and it still is not helping as much.  She has been using the 3 of tramadol during the daytime and the 1 in the evening is helping her sleep.  She says she is just not getting the greatest rest because very thing that is going on.  She says her anxiety is also up through the daytime as well.  She denies any suicidal ideations or thoughts of hurting herself.  No flowsheet data found.   Pain contract signed on:  No diagnosis found.  Outpatient Encounter Medications as of 04/28/2018  Medication Sig  . ALPRAZolam (XANAX) 0.25 MG tablet Take 1 tablet (0.25 mg total) by mouth at bedtime.  Marland Kitchen  amLODipine (NORVASC) 5 MG tablet TAKE 1 TABLET EVERY DAY  . baclofen (LIORESAL) 10 MG tablet TAKE 1 TABLET (10 MG) AT BEDTIME AS NEEDED FOR MUSCLE SPASMS  . Cholecalciferol (VITAMIN D-1000 MAX ST) 1000 units tablet Take by mouth.  . DULoxetine (CYMBALTA) 60 MG capsule Take 1 capsule (60 mg total) by mouth daily.  . enalapril (VASOTEC) 20 MG tablet TAKE 1 AND 1/2 TABLETS EVERY DAY  . fluticasone (FLONASE) 50 MCG/ACT  nasal spray USE 2 SPRAYS IN EACH NOSTRIL EVERY DAY  . hydrochlorothiazide (HYDRODIURIL) 25 MG tablet TAKE 1 TABLET EVERY DAY  . hyoscyamine (ANASPAZ) 0.125 MG TBDP disintergrating tablet Take 1 tablet (0.125 mg total) by mouth every 6 (six) hours as needed.  . loratadine (CLARITIN) 10 MG tablet Take 10 mg by mouth daily.  . meloxicam (MOBIC) 15 MG tablet TAKE 1 TABLET EVERY DAY  . Omega-3 Fatty Acids (FISH OIL) 1000 MG CAPS Take 2,000 mg by mouth.   . ondansetron (ZOFRAN ODT) 4 MG disintegrating tablet Take 1 tablet (4 mg total) by mouth every 8 (eight) hours as needed for nausea or vomiting.  . pantoprazole (PROTONIX) 40 MG tablet Take 1 tablet (40 mg total) by mouth daily.  . Probiotic Product (PROBIOTIC & ACIDOPHILUS EX ST PO) Take by mouth.  . traMADol (ULTRAM) 50 MG tablet Take 1 tablet (50 mg total) by mouth 3 (three) times daily as needed.   No facility-administered encounter medications on file as of 04/28/2018.     Review of Systems  Constitutional: Negative for chills and fever.  Eyes: Negative for visual disturbance.  Respiratory: Negative for chest tightness and shortness of breath.   Cardiovascular: Negative for chest pain and leg swelling.  Musculoskeletal: Positive for back pain. Negative for gait problem.  Skin: Negative for rash.  Neurological: Negative for light-headedness and headaches.  Psychiatric/Behavioral: Positive for dysphoric mood and sleep disturbance. Negative for agitation, behavioral problems, self-injury and suicidal ideas. The patient is nervous/anxious.   All other systems reviewed and are negative.   Observations/Objective: Patient sounds comfortable and in no acute distress  Assessment and Plan: Problem List Items Addressed This Visit      Cardiovascular and Mediastinum   Hypertension (Chronic)   Relevant Medications   amLODipine (NORVASC) 5 MG tablet   enalapril (VASOTEC) 20 MG tablet   hydrochlorothiazide (HYDRODIURIL) 25 MG tablet      Musculoskeletal and Integument   Degenerative disc disease, lumbar (Chronic)   Relevant Medications   traMADol (ULTRAM) 50 MG tablet     Other   Hyperlipemia - Primary (Chronic)   Relevant Medications   amLODipine (NORVASC) 5 MG tablet   enalapril (VASOTEC) 20 MG tablet   hydrochlorothiazide (HYDRODIURIL) 25 MG tablet   GAD (generalized anxiety disorder) (Chronic)   Relevant Medications   ALPRAZolam (XANAX) 0.25 MG tablet   DULoxetine (CYMBALTA) 60 MG capsule   QUEtiapine (SEROQUEL) 50 MG tablet   Encounter for pain management counseling   Chronic low back pain   Relevant Medications   traMADol (ULTRAM) 50 MG tablet   DULoxetine (CYMBALTA) 60 MG capsule   Pain management contract agreement   Relevant Medications   traMADol (ULTRAM) 50 MG tablet    Other Visit Diagnoses    Gastroesophageal reflux disease without esophagitis       Relevant Medications   pantoprazole (PROTONIX) 40 MG tablet       Follow Up Instructions:  Continue her Xanax and tramadol for now but gave her Seroquel to try at night  instead and see if that does better for at night and try not to use the Xanax if she does not need it.  Patient is awaiting a back surgery which has been delayed due to the coronavirus   I discussed the assessment and treatment plan with the patient. The patient was provided an opportunity to ask questions and all were answered. The patient agreed with the plan and demonstrated an understanding of the instructions.   The patient was advised to call back or seek an in-person evaluation if the symptoms worsen or if the condition fails to improve as anticipated.  The above assessment and management plan was discussed with the patient. The patient verbalized understanding of and has agreed to the management plan. Patient is aware to call the clinic if symptoms persist or worsen. Patient is aware when to return to the clinic for a follow-up visit. Patient educated on when it is appropriate  to go to the emergency department.    I provided 21 minutes of non-face-to-face time during this encounter.    Worthy Rancher, MD

## 2018-05-03 ENCOUNTER — Telehealth: Payer: Self-pay | Admitting: Internal Medicine

## 2018-05-03 NOTE — Telephone Encounter (Signed)
Please advise 

## 2018-05-03 NOTE — Telephone Encounter (Signed)
I agree - let's reschedule ~ 1 mo after her surgery, end of June or in July.

## 2018-05-03 NOTE — Telephone Encounter (Signed)
Patient called and wants to know if she should keep her appointment on Friday 05/05/2018 since she has not yet had her surgery due to delays.  Please let her know at [phone]

## 2018-05-04 ENCOUNTER — Encounter: Payer: Self-pay | Admitting: Family Medicine

## 2018-05-04 ENCOUNTER — Other Ambulatory Visit: Payer: Self-pay

## 2018-05-04 ENCOUNTER — Ambulatory Visit (INDEPENDENT_AMBULATORY_CARE_PROVIDER_SITE_OTHER): Payer: Medicare HMO | Admitting: Family Medicine

## 2018-05-04 DIAGNOSIS — G2581 Restless legs syndrome: Secondary | ICD-10-CM

## 2018-05-04 NOTE — Progress Notes (Signed)
Virtual Visit via telephone Note  I connected with Maria Moss on 05/04/18 at 1125 by telephone and verified that I am speaking with the correct person using two identifiers. Maria Moss is currently located at home and no other people are currently with her during visit. The provider, Fransisca Kaufmann Dettinger, MD is located in their office at time of visit.  Call ended at 1132  I discussed the limitations, risks, security and privacy concerns of performing an evaluation and management service by telephone and the availability of in person appointments. I also discussed with the patient that there may be a patient responsible charge related to this service. The patient expressed understanding and agreed to proceed.   History and Present Illness: Patient says that says that since doing Seroquel she just feels her leg is fidgety and she can't keep her legs can't be still.  Patient says it is just started since her 2 medication changes which were that she doubled the Cymbalta and started Seroquel which she has never had before for sleep.  She does not know which of the 2 necessarily did this but she thinks it was a Seroquel because she had been on Cymbalta previously and had not had any issues.  She denies any feelings of anxiety or suicidal ideations or thoughts of hurting herself.  She does not have any symptoms during the daytime but only when she tries bedtime.  No diagnosis found.  Outpatient Encounter Medications as of 05/04/2018  Medication Sig  . ALPRAZolam (XANAX) 0.25 MG tablet Take 1 tablet (0.25 mg total) by mouth at bedtime.  Marland Kitchen amLODipine (NORVASC) 5 MG tablet Take 1 tablet (5 mg total) by mouth daily.  . baclofen (LIORESAL) 10 MG tablet TAKE 1 TABLET (10 MG) AT BEDTIME AS NEEDED FOR MUSCLE SPASMS  . Cholecalciferol (VITAMIN D-1000 MAX ST) 1000 units tablet Take by mouth.  . DULoxetine (CYMBALTA) 60 MG capsule Take 2 capsules (120 mg total) by mouth daily.  . enalapril (VASOTEC) 20 MG  tablet Take 1.5 tablets (30 mg total) by mouth daily.  . fluticasone (FLONASE) 50 MCG/ACT nasal spray USE 2 SPRAYS IN EACH NOSTRIL EVERY DAY  . hydrochlorothiazide (HYDRODIURIL) 25 MG tablet Take 1 tablet (25 mg total) by mouth daily.  . hyoscyamine (ANASPAZ) 0.125 MG TBDP disintergrating tablet Take 1 tablet (0.125 mg total) by mouth every 6 (six) hours as needed.  . loratadine (CLARITIN) 10 MG tablet Take 10 mg by mouth daily.  . meloxicam (MOBIC) 15 MG tablet TAKE 1 TABLET EVERY DAY  . Omega-3 Fatty Acids (FISH OIL) 1000 MG CAPS Take 2,000 mg by mouth.   . ondansetron (ZOFRAN ODT) 4 MG disintegrating tablet Take 1 tablet (4 mg total) by mouth every 8 (eight) hours as needed for nausea or vomiting.  . pantoprazole (PROTONIX) 40 MG tablet Take 1 tablet (40 mg total) by mouth daily.  . Probiotic Product (PROBIOTIC & ACIDOPHILUS EX ST PO) Take by mouth.  . QUEtiapine (SEROQUEL) 50 MG tablet Take 1 tablet (50 mg total) by mouth at bedtime.  . traMADol (ULTRAM) 50 MG tablet Take 1 tablet (50 mg total) by mouth 3 (three) times daily as needed.   No facility-administered encounter medications on file as of 05/04/2018.     Review of Systems  Constitutional: Negative for chills and fever.  Eyes: Negative for visual disturbance.  Respiratory: Negative for chest tightness and shortness of breath.   Cardiovascular: Negative for chest pain and leg swelling.  Skin:  Negative for rash.  Neurological: Negative for light-headedness.  Psychiatric/Behavioral: Positive for sleep disturbance. Negative for agitation, behavioral problems, self-injury and suicidal ideas. The patient is not nervous/anxious.   All other systems reviewed and are negative.   Observations/Objective: Patient sounds comfortable on the phone and in no acute distress  Assessment and Plan: Problem List Items Addressed This Visit    None    Visit Diagnoses    Restless leg    -  Primary       Follow Up Instructions:  Try coming  off of seroquel for 1 week and make an appointment for 1 week and if not improved then we will try other changes.   I discussed the assessment and treatment plan with the patient. The patient was provided an opportunity to ask questions and all were answered. The patient agreed with the plan and demonstrated an understanding of the instructions.   The patient was advised to call back or seek an in-person evaluation if the symptoms worsen or if the condition fails to improve as anticipated.  The above assessment and management plan was discussed with the patient. The patient verbalized understanding of and has agreed to the management plan. Patient is aware to call the clinic if symptoms persist or worsen. Patient is aware when to return to the clinic for a follow-up visit. Patient educated on when it is appropriate to go to the emergency department.    I provided 7 minutes of non-face-to-face time during this encounter.    Worthy Rancher, MD

## 2018-05-04 NOTE — Telephone Encounter (Signed)
LVM requesting returned call 

## 2018-05-05 ENCOUNTER — Ambulatory Visit: Payer: Medicare HMO | Admitting: Internal Medicine

## 2018-05-19 NOTE — Patient Instructions (Addendum)
Maria Moss    Your procedure is scheduled on:05/31/18    Report to [facility] Main  Entrance  Report to admitting at 5:30 AM   [place] [facility] TEST ON__fRIDAY 5/22 Do not eat food After Midnight. YOU MAY HAVE CLEAR LIQUIDS FROM MIDNIGHT UNTIL 4:30AM. At 4:30AM Please finish the prescribed Pre-Surgery Gatorade drink. Nothing by mouth after you finish the Gatorade drink !_____, THIS TEST MUST BE DONE BEFORE SURGERY, COME TO [place] ENTRANCE BETWEEN THE HOURS OF 900 AM AND 300 PM ON YOUR COVID TEST DATE.   Call this number if you have problems the morning of surgery [phone]    Remember: Do not eat food after Midnight.  BRUSH YOUR TEETH MORNING OF SURGERY AND RINSE YOUR MOUTH OUT, NO CHEWING GUM CANDY OR MINTS. Do not eat food After Midnight.  YOU MAY HAVE CLEAR LIQUIDS FROM MIDNIGHT UNTIL 4:30AM.  At 4:30AM  Please finish the prescribed Pre-Surgery Gatorade drink. Nothing by mouth after you finish the Gatorade drink !    Take these medicines the morning of surgery with A SIP OF WATER:Tramadol(Ultram),  Dulcxetine(Cymbalta),Zyrtec,Amlodipine(Norvasc),Flonase,Pepcid.                                  You may not have any metal on your body including hair pins and              piercings  Do not wear jewelry, make-up, lotions, powders or perfumes, deodorant              Do not bring valuables to the hospital. [place].  Contacts, dentures or bridgework may not be worn into surgery.                 Please read over the following fact sheets you were given: _____________________________________________________________________             [facility] - Preparing for Surgery Before surgery, you can play an important role.  Because skin is not sterile, your skin needs to be as free of germs as possible.  You can reduce the number of germs on your skin by washing with  CHG (chlorahexidine gluconate) soap before surgery.  CHG is an antiseptic cleaner which kills germs and bonds with the skin to continue killing germs even after washing. Please DO NOT use if you have an allergy to CHG or antibacterial soaps.  If your skin becomes reddened/irritated stop using the CHG and inform your nurse when you arrive at Short Stay. Do not shave (including legs and underarms) for at least 48 hours prior to the first CHG shower.  You may shave your face/neck. Please follow these instructions carefully:  1.  Shower with CHG Soap the night before surgery and the  morning of Surgery.  2.  If you choose to wash your hair, wash your hair first as usual with your  normal  shampoo.  3.  After you shampoo, rinse your hair and body thoroughly to remove the  shampoo.                            4.  Use CHG as  you would any other liquid soap.  You can apply chg directly  to the skin and wash                       Gently with a scrungie or clean washcloth.  5.  Apply the CHG Soap to your body ONLY FROM THE NECK DOWN.   Do not use on face/ open                           Wound or open sores. Avoid contact with eyes, ears mouth and genitals (private parts).                       Wash face,  Genitals (private parts) with your normal soap.             6.  Wash thoroughly, paying special attention to the area where your surgery  will be performed.  7.  Thoroughly rinse your body with warm water from the neck down.  8.  DO NOT shower/wash with your normal soap after using and rinsing off  the CHG Soap.                9.  Pat yourself dry with a clean towel.            10.  Wear clean pajamas.            11.  Place clean sheets on your bed the night of your first shower and do not  sleep with pets. Day of Surgery : Do not apply any lotions/deodorants the morning of surgery.  Please wear clean clothes to the hospital/surgery center.  FAILURE TO FOLLOW THESE INSTRUCTIONS MAY RESULT IN THE  CANCELLATION OF YOUR SURGERY PATIENT SIGNATURE_________________________________  NURSE SIGNATURE__________________________________  ________________________________________________________________________

## 2018-05-19 NOTE — Progress Notes (Addendum)
SPOKE W/ Maria Moss pt called back 05-19-18 at 544 pm  Left message to call if having covid symptoms on 05-22-2018   SCREENING SYMPTOMS OF COVID 19:   COUGH--no  RUNNY NOSE--- no  SORE THROAT---no NASAL CONGESTION----no  SNEEZING----no  SHORTNESS OF BREATH---no  DIFFICULTY BREATHING---no  TEMP >100.0 -----no  UNEXPLAINED BODY ACHES------no  CHILLS -------- no no HEADACHES ---------no  LOSS OF SMELL/ TASTE --------no    HAVE YOU OR ANY FAMILY MEMBER TRAVELLED PAST 14 DAYS OUT OF THE  COUNTY---no STATE----no COUNTRY----no  HAVE YOU OR ANY FAMILY MEMBER BEEN EXPOSED TO ANYONE WITH COVID 19? no

## 2018-05-22 ENCOUNTER — Encounter (HOSPITAL_COMMUNITY)
Admission: RE | Admit: 2018-05-22 | Discharge: 2018-05-22 | Disposition: A | Discharge status: Home / Self Care | Payer: Medicare HMO | Source: Ambulatory Visit | Attending: General Surgery | Admitting: General Surgery

## 2018-05-22 ENCOUNTER — Other Ambulatory Visit: Payer: Self-pay

## 2018-05-22 ENCOUNTER — Encounter (HOSPITAL_COMMUNITY): Payer: Self-pay

## 2018-05-22 DIAGNOSIS — Z01818 Encounter for other preprocedural examination: Secondary | ICD-10-CM | POA: Insufficient documentation

## 2018-05-22 HISTORY — DX: Fibromyalgia: M79.7

## 2018-05-22 HISTORY — DX: Gastro-esophageal reflux disease without esophagitis: K21.9

## 2018-05-22 HISTORY — DX: Personal history of other diseases of the digestive system: Z87.19

## 2018-05-22 LAB — BASIC METABOLIC PANEL
Anion gap: 10 (ref 5–15)
BUN: 32 mg/dL — ABNORMAL HIGH (ref 8–23)
CO2: 26 mmol/L (ref 22–32)
Calcium: 9.9 mg/dL (ref 8.9–10.3)
Chloride: 101 mmol/L (ref 98–111)
Creatinine, Ser: 1.35 mg/dL — ABNORMAL HIGH (ref 0.44–1.00)
GFR calc Af Amer: 47 mL/min — ABNORMAL LOW (ref >60)
GFR calc non Af Amer: 41 mL/min — ABNORMAL LOW (ref >60)
Glucose, Bld: 98 mg/dL (ref 70–99)
Potassium: 4.2 mmol/L (ref 3.5–5.1)
Sodium: 137 mmol/L (ref 135–145)

## 2018-05-22 LAB — CBC
HCT: 35.6 % — ABNORMAL LOW (ref 36.0–46.0)
Hemoglobin: 11.2 g/dL — ABNORMAL LOW (ref 12.0–15.0)
MCH: 27.7 pg (ref 26.0–34.0)
MCHC: 31.5 g/dL (ref 30.0–36.0)
MCV: 88.1 fL (ref 80.0–100.0)
Platelets: 501 10*3/uL — ABNORMAL HIGH (ref 150–400)
RBC: 4.04 MIL/uL (ref 3.87–5.11)
RDW: 13.2 % (ref 11.5–15.5)
WBC: 8.5 10*3/uL (ref 4.0–10.5)
nRBC: 0 % (ref 0.0–0.2)

## 2018-05-22 MED ORDER — ENSURE SURGERY PO LIQD
237.0000 mL | Freq: Two times a day (BID) | ORAL | Status: DC
  Filled 2018-05-22: qty 237

## 2018-05-22 NOTE — Progress Notes (Signed)
Konrad Felix PA Please review labs in chart. EKG results are pending

## 2018-05-26 ENCOUNTER — Other Ambulatory Visit (HOSPITAL_COMMUNITY)
Admission: RE | Admit: 2018-05-26 | Discharge: 2018-05-26 | Disposition: A | Discharge status: Home / Self Care | Payer: Medicare HMO | Source: Ambulatory Visit | Attending: General Surgery | Admitting: General Surgery

## 2018-05-26 ENCOUNTER — Other Ambulatory Visit: Payer: Self-pay

## 2018-05-26 DIAGNOSIS — Z1159 Encounter for screening for other viral diseases: Secondary | ICD-10-CM | POA: Insufficient documentation

## 2018-05-27 LAB — NOVEL CORONAVIRUS, NAA (HOSP ORDER, SEND-OUT TO REF LAB; TAT 18-24 HRS): SARS-CoV-2, NAA: NOT DETECTED

## 2018-05-29 ENCOUNTER — Other Ambulatory Visit: Payer: Self-pay | Admitting: Family Medicine

## 2018-05-29 DIAGNOSIS — I10 Essential (primary) hypertension: Secondary | ICD-10-CM

## 2018-05-30 NOTE — Anesthesia Preprocedure Evaluation (Addendum)
Anesthesia Evaluation  Patient identified by MRN, date of birth, ID band Patient awake    Reviewed: Allergy & Precautions, NPO status , Patient's Chart, lab work & pertinent test results  History of Anesthesia Complications Negative for: history of anesthetic complications  Airway Mallampati: II  TM Distance: >3 FB Neck ROM: Full    Dental no notable dental hx.    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  Renal/GU negative Renal ROS  negative genitourinary   Musculoskeletal  (+) Fibromyalgia -  Abdominal   Peds  Hematology negative hematology ROS (+)   Anesthesia Other Findings   Reproductive/Obstetrics                           Anesthesia Physical Anesthesia Plan  ASA: III  Anesthesia Plan: General   Post-op Pain Management:    Induction: Intravenous and Rapid sequence  PONV Risk Score and Plan: 3 and Ondansetron, Dexamethasone, Midazolam and Treatment may vary due to age or medical condition  Airway Management Planned: Oral ETT  Additional Equipment: None  Intra-op Plan:   Post-operative Plan: Extubation in OR  Informed Consent: I have reviewed the patients History and Physical, chart, labs and discussed the procedure including the risks, benefits and alternatives for the proposed anesthesia with the patient or authorized representative who has indicated his/her understanding and acceptance.     Dental advisory given  Plan Discussed with:   Anesthesia Plan Comments:        Anesthesia Quick Evaluation

## 2018-05-30 NOTE — Progress Notes (Signed)
SPOKE W/  Patient via phone     SCREENING SYMPTOMS OF COVID 19:   COUGH-- no  RUNNY NOSE--- yes, normal for allergies.  SORE THROAT--- no  NASAL CONGESTION---- no  SNEEZING---- no  SHORTNESS OF BREATH--- no  DIFFICULTY BREATHING--- no  TEMP >100.0 ----- no  UNEXPLAINED BODY ACHES------ no  CHILLS -------- no  HEADACHES --------- no  LOSS OF SMELL/ TASTE -------- no    HAVE YOU OR ANY FAMILY MEMBER TRAVELLED PAST 14 DAYS OUT OF THE   COUNTY--- no STATE---- no COUNTRY---- no  HAVE YOU OR ANY FAMILY MEMBER BEEN EXPOSED TO ANYONE WITH COVID 19?   no

## 2018-05-30 NOTE — Progress Notes (Addendum)
Left message for patient to call back if she has any Covid 19 symptoms.  1735  Patient called back and questions asked.   SCREENING SYMPTOMS OF COVID 19:   COUGH--no  RUNNY NOSE--- no  SORE THROAT---no  NASAL CONGESTION----no  SNEEZING----no  SHORTNESS OF BREATH---no  DIFFICULTY BREATHING---no  TEMP >100.0 -----no  UNEXPLAINED BODY ACHES------no  CHILLS -------- no  HEADACHES ---------no  LOSS OF SMELL/ TASTE --------no    HAVE YOU OR ANY FAMILY MEMBER TRAVELLED PAST 14 DAYS OUT OF THE   COUNTY---no STATE----no COUNTRY----no  HAVE YOU OR ANY FAMILY MEMBER BEEN EXPOSED TO ANYONE WITH COVID 19? no

## 2018-05-31 ENCOUNTER — Other Ambulatory Visit: Payer: Self-pay

## 2018-05-31 ENCOUNTER — Encounter (HOSPITAL_COMMUNITY)
Admission: RE | Disposition: A | Discharge status: Home / Self Care | Payer: Self-pay | Source: Home / Self Care | Attending: General Surgery

## 2018-05-31 ENCOUNTER — Inpatient Hospital Stay (HOSPITAL_COMMUNITY): Payer: Medicare HMO | Admitting: Certified Registered"

## 2018-05-31 ENCOUNTER — Encounter (HOSPITAL_COMMUNITY): Payer: Self-pay

## 2018-05-31 ENCOUNTER — Inpatient Hospital Stay (HOSPITAL_COMMUNITY): Payer: Medicare HMO | Admitting: Physician Assistant

## 2018-05-31 ENCOUNTER — Inpatient Hospital Stay (HOSPITAL_COMMUNITY)
Admission: RE | Admit: 2018-05-31 | Discharge: 2018-06-01 | DRG: 615 | Disposition: A | Discharge status: Home / Self Care | Payer: Medicare HMO | Attending: General Surgery | Admitting: General Surgery

## 2018-05-31 DIAGNOSIS — E279 Disorder of adrenal gland, unspecified: Principal | ICD-10-CM | POA: Diagnosis present

## 2018-05-31 DIAGNOSIS — I10 Essential (primary) hypertension: Secondary | ICD-10-CM | POA: Diagnosis present

## 2018-05-31 DIAGNOSIS — D1803 Hemangioma of intra-abdominal structures: Secondary | ICD-10-CM | POA: Diagnosis not present

## 2018-05-31 DIAGNOSIS — E896 Postprocedural adrenocortical (-medullary) hypofunction: Secondary | ICD-10-CM

## 2018-05-31 DIAGNOSIS — E278 Other specified disorders of adrenal gland: Secondary | ICD-10-CM | POA: Diagnosis not present

## 2018-05-31 DIAGNOSIS — D1809 Hemangioma of other sites: Secondary | ICD-10-CM | POA: Diagnosis not present

## 2018-05-31 DIAGNOSIS — F419 Anxiety disorder, unspecified: Secondary | ICD-10-CM | POA: Diagnosis present

## 2018-05-31 DIAGNOSIS — E785 Hyperlipidemia, unspecified: Secondary | ICD-10-CM | POA: Diagnosis not present

## 2018-05-31 DIAGNOSIS — K219 Gastro-esophageal reflux disease without esophagitis: Secondary | ICD-10-CM | POA: Diagnosis present

## 2018-05-31 HISTORY — PX: ROBOTIC ADRENALECTOMY: SHX6407

## 2018-05-31 SURGERY — ADRENALECTOMY, ROBOT-ASSISTED
Anesthesia: General | Site: Abdomen | Laterality: Right

## 2018-05-31 MED ORDER — GABAPENTIN 300 MG PO CAPS
300.0000 mg | ORAL_CAPSULE | ORAL | Status: AC
  Administered 2018-05-31: 300 mg via ORAL
  Filled 2018-05-31: qty 1

## 2018-05-31 MED ORDER — PHENYLEPHRINE 40 MCG/ML (10ML) SYRINGE FOR IV PUSH (FOR BLOOD PRESSURE SUPPORT)
PREFILLED_SYRINGE | INTRAVENOUS | Status: AC
  Filled 2018-05-31: qty 10

## 2018-05-31 MED ORDER — ONDANSETRON 4 MG PO TBDP: 4.0000 mg | ORAL_TABLET | Freq: Three times a day (TID) | ORAL | Status: DC | PRN

## 2018-05-31 MED ORDER — ONDANSETRON 4 MG PO TBDP: 4.0000 mg | ORAL_TABLET | Freq: Four times a day (QID) | ORAL | Status: DC | PRN

## 2018-05-31 MED ORDER — ROCURONIUM BROMIDE 10 MG/ML (PF) SYRINGE
PREFILLED_SYRINGE | INTRAVENOUS | Status: AC
  Filled 2018-05-31: qty 10

## 2018-05-31 MED ORDER — KETOROLAC TROMETHAMINE 15 MG/ML IJ SOLN
15.0000 mg | Freq: Four times a day (QID) | INTRAMUSCULAR | Status: DC | PRN
  Administered 2018-05-31: 15 mg via INTRAVENOUS
  Filled 2018-05-31: qty 1

## 2018-05-31 MED ORDER — LIDOCAINE 2% (20 MG/ML) 5 ML SYRINGE
INTRAMUSCULAR | Status: AC
  Filled 2018-05-31: qty 5

## 2018-05-31 MED ORDER — PHENYLEPHRINE 40 MCG/ML (10ML) SYRINGE FOR IV PUSH (FOR BLOOD PRESSURE SUPPORT)
PREFILLED_SYRINGE | INTRAVENOUS | Status: DC | PRN
  Administered 2018-05-31 (×8): 40 ug via INTRAVENOUS

## 2018-05-31 MED ORDER — OXYCODONE HCL 5 MG PO TABS: 5.0000 mg | ORAL_TABLET | Freq: Once | ORAL | Status: DC | PRN

## 2018-05-31 MED ORDER — ACETAMINOPHEN 500 MG PO TABS
1000.0000 mg | ORAL_TABLET | ORAL | Status: AC
  Administered 2018-05-31: 1000 mg via ORAL
  Filled 2018-05-31: qty 2

## 2018-05-31 MED ORDER — ONDANSETRON HCL 4 MG/2ML IJ SOLN: 4.0000 mg | Freq: Once | INTRAMUSCULAR | Status: DC | PRN

## 2018-05-31 MED ORDER — HYDROMORPHONE HCL 1 MG/ML IJ SOLN: 1.0000 mg | INTRAMUSCULAR | Status: DC | PRN

## 2018-05-31 MED ORDER — TRAMADOL HCL 50 MG PO TABS
50.0000 mg | ORAL_TABLET | Freq: Four times a day (QID) | ORAL | Status: DC | PRN
  Administered 2018-05-31: 22:00:00 50 mg via ORAL
  Filled 2018-05-31: qty 1

## 2018-05-31 MED ORDER — CEFAZOLIN SODIUM-DEXTROSE 2-4 GM/100ML-% IV SOLN
2.0000 g | INTRAVENOUS | Status: AC
  Administered 2018-05-31: 2 g via INTRAVENOUS
  Filled 2018-05-31: qty 100

## 2018-05-31 MED ORDER — PROPOFOL 10 MG/ML IV BOLUS
INTRAVENOUS | Status: DC | PRN
  Administered 2018-05-31: 130 mg via INTRAVENOUS

## 2018-05-31 MED ORDER — BUPIVACAINE-EPINEPHRINE (PF) 0.25% -1:200000 IJ SOLN
INTRAMUSCULAR | Status: AC
  Filled 2018-05-31: qty 30

## 2018-05-31 MED ORDER — FAMOTIDINE 20 MG PO TABS
20.0000 mg | ORAL_TABLET | Freq: Every day | ORAL | Status: DC
  Administered 2018-06-01: 20 mg via ORAL
  Filled 2018-05-31: qty 1

## 2018-05-31 MED ORDER — SODIUM CHLORIDE 0.9 % IV SOLN
INTRAVENOUS | Status: DC | PRN
  Administered 2018-05-31: 07:00:00 via INTRAVENOUS

## 2018-05-31 MED ORDER — ONDANSETRON HCL 4 MG/2ML IJ SOLN: 4.0000 mg | Freq: Four times a day (QID) | INTRAMUSCULAR | Status: DC | PRN

## 2018-05-31 MED ORDER — LIDOCAINE 2% (20 MG/ML) 5 ML SYRINGE: INTRAMUSCULAR | Status: DC | PRN

## 2018-05-31 MED ORDER — PROPOFOL 10 MG/ML IV BOLUS
INTRAVENOUS | Status: AC
  Filled 2018-05-31: qty 40

## 2018-05-31 MED ORDER — SUGAMMADEX SODIUM 200 MG/2ML IV SOLN
INTRAVENOUS | Status: DC | PRN
  Administered 2018-05-31: 200 mg via INTRAVENOUS

## 2018-05-31 MED ORDER — QUETIAPINE FUMARATE 50 MG PO TABS
50.0000 mg | ORAL_TABLET | Freq: Every day | ORAL | Status: DC
  Administered 2018-05-31: 50 mg via ORAL
  Filled 2018-05-31: qty 1

## 2018-05-31 MED ORDER — FENTANYL CITRATE (PF) 250 MCG/5ML IJ SOLN
INTRAMUSCULAR | Status: AC
  Filled 2018-05-31: qty 5

## 2018-05-31 MED ORDER — ROCURONIUM BROMIDE 10 MG/ML (PF) SYRINGE
PREFILLED_SYRINGE | INTRAVENOUS | Status: DC | PRN
  Administered 2018-05-31: 10 mg via INTRAVENOUS
  Administered 2018-05-31: 20 mg via INTRAVENOUS
  Administered 2018-05-31: 40 mg via INTRAVENOUS
  Administered 2018-05-31 (×2): 10 mg via INTRAVENOUS

## 2018-05-31 MED ORDER — ALPRAZOLAM 0.25 MG PO TABS
0.2500 mg | ORAL_TABLET | Freq: Every day | ORAL | Status: DC
  Administered 2018-05-31: 0.25 mg via ORAL
  Filled 2018-05-31: qty 1

## 2018-05-31 MED ORDER — CELECOXIB 200 MG PO CAPS
200.0000 mg | ORAL_CAPSULE | ORAL | Status: AC
  Administered 2018-05-31: 200 mg via ORAL
  Filled 2018-05-31: qty 1

## 2018-05-31 MED ORDER — FENTANYL CITRATE (PF) 100 MCG/2ML IJ SOLN: 25.0000 ug | INTRAMUSCULAR | Status: DC | PRN

## 2018-05-31 MED ORDER — DEXAMETHASONE SODIUM PHOSPHATE 10 MG/ML IJ SOLN
INTRAMUSCULAR | Status: DC | PRN
  Administered 2018-05-31: 7 mg via INTRAVENOUS

## 2018-05-31 MED ORDER — KETAMINE HCL 10 MG/ML IJ SOLN
INTRAMUSCULAR | Status: AC
  Filled 2018-05-31: qty 1

## 2018-05-31 MED ORDER — 0.9 % SODIUM CHLORIDE (POUR BTL) OPTIME
TOPICAL | Status: DC | PRN
  Administered 2018-05-31: 1000 mL

## 2018-05-31 MED ORDER — DEXTROSE-NACL 5-0.9 % IV SOLN
INTRAVENOUS | Status: DC
  Administered 2018-05-31 – 2018-06-01 (×2): via INTRAVENOUS

## 2018-05-31 MED ORDER — CHLORHEXIDINE GLUCONATE CLOTH 2 % EX PADS: 6.0000 | MEDICATED_PAD | Freq: Once | CUTANEOUS | Status: DC

## 2018-05-31 MED ORDER — MIDAZOLAM HCL 2 MG/2ML IJ SOLN
INTRAMUSCULAR | Status: AC
  Filled 2018-05-31: qty 2

## 2018-05-31 MED ORDER — ENALAPRIL MALEATE 10 MG PO TABS
20.0000 mg | ORAL_TABLET | Freq: Every day | ORAL | Status: DC
  Administered 2018-05-31: 20 mg via ORAL
  Filled 2018-05-31 (×2): qty 2

## 2018-05-31 MED ORDER — LIDOCAINE 2% (20 MG/ML) 5 ML SYRINGE
INTRAMUSCULAR | Status: DC | PRN
  Administered 2018-05-31: 1.5 mg/kg/h via INTRAVENOUS

## 2018-05-31 MED ORDER — SUCCINYLCHOLINE CHLORIDE 200 MG/10ML IV SOSY
PREFILLED_SYRINGE | INTRAVENOUS | Status: DC | PRN
  Administered 2018-05-31: 80 mg via INTRAVENOUS

## 2018-05-31 MED ORDER — LIDOCAINE 2% (20 MG/ML) 5 ML SYRINGE
INTRAMUSCULAR | Status: DC | PRN
  Administered 2018-05-31: 50 mg via INTRAVENOUS

## 2018-05-31 MED ORDER — KETAMINE HCL 10 MG/ML IJ SOLN
INTRAMUSCULAR | Status: DC | PRN
  Administered 2018-05-31: 25 mg via INTRAVENOUS

## 2018-05-31 MED ORDER — SUGAMMADEX SODIUM 200 MG/2ML IV SOLN
INTRAVENOUS | Status: AC
  Filled 2018-05-31: qty 2

## 2018-05-31 MED ORDER — HYDROCHLOROTHIAZIDE 25 MG PO TABS
25.0000 mg | ORAL_TABLET | Freq: Every day | ORAL | Status: DC
  Administered 2018-05-31: 25 mg via ORAL
  Filled 2018-05-31 (×2): qty 1

## 2018-05-31 MED ORDER — MIDAZOLAM HCL 2 MG/2ML IJ SOLN
INTRAMUSCULAR | Status: DC | PRN
  Administered 2018-05-31: 2 mg via INTRAVENOUS

## 2018-05-31 MED ORDER — FENTANYL CITRATE (PF) 250 MCG/5ML IJ SOLN
INTRAMUSCULAR | Status: DC | PRN
  Administered 2018-05-31: 50 ug via INTRAVENOUS
  Administered 2018-05-31: 100 ug via INTRAVENOUS

## 2018-05-31 MED ORDER — BUPIVACAINE-EPINEPHRINE 0.25% -1:200000 IJ SOLN
INTRAMUSCULAR | Status: DC | PRN
  Administered 2018-05-31: 30 mL

## 2018-05-31 MED ORDER — DEXAMETHASONE SODIUM PHOSPHATE 10 MG/ML IJ SOLN
INTRAMUSCULAR | Status: AC
  Filled 2018-05-31: qty 1

## 2018-05-31 MED ORDER — OXYCODONE HCL 5 MG/5ML PO SOLN: 5.0000 mg | Freq: Once | ORAL | Status: DC | PRN

## 2018-05-31 MED ORDER — LACTATED RINGERS IV SOLN
INTRAVENOUS | Status: DC
  Administered 2018-05-31: 07:00:00 via INTRAVENOUS

## 2018-05-31 MED ORDER — ONDANSETRON HCL 4 MG/2ML IJ SOLN
INTRAMUSCULAR | Status: AC
  Filled 2018-05-31: qty 2

## 2018-05-31 MED ORDER — ONDANSETRON HCL 4 MG/2ML IJ SOLN
INTRAMUSCULAR | Status: DC | PRN
  Administered 2018-05-31: 4 mg via INTRAVENOUS

## 2018-05-31 MED ORDER — LIDOCAINE HCL (PF) 2 % IJ SOLN
INTRAMUSCULAR | Status: AC
  Filled 2018-05-31: qty 10

## 2018-05-31 MED ORDER — PANTOPRAZOLE SODIUM 40 MG PO TBEC
40.0000 mg | DELAYED_RELEASE_TABLET | Freq: Every day | ORAL | Status: DC
  Administered 2018-05-31 – 2018-06-01 (×2): 40 mg via ORAL
  Filled 2018-05-31 (×2): qty 1

## 2018-05-31 MED ORDER — LACTATED RINGERS IR SOLN
Status: DC | PRN
  Administered 2018-05-31: 1000 mL

## 2018-05-31 MED ORDER — AMLODIPINE BESYLATE 5 MG PO TABS
5.0000 mg | ORAL_TABLET | Freq: Every day | ORAL | Status: DC
  Filled 2018-05-31: qty 1

## 2018-05-31 SURGICAL SUPPLY — 70 items
ADH SKN CLS APL DERMABOND .7 (GAUZE/BANDAGES/DRESSINGS) ×1
APL PRP STRL LF DISP 70% ISPRP (MISCELLANEOUS) ×1
APPLIER CLIP 5 13 M/L LIGAMAX5 (MISCELLANEOUS)
APR CLP MED LRG 5 ANG JAW (MISCELLANEOUS)
BAG SPEC RTRVL 10 TROC 200 (ENDOMECHANICALS) ×1
BLADE SURG SZ11 CARB STEEL (BLADE) ×2 IMPLANT
CHLORAPREP W/TINT 26 (MISCELLANEOUS) ×2 IMPLANT
CLIP APPLIE 5 13 M/L LIGAMAX5 (MISCELLANEOUS) IMPLANT
CLIP VESOLOCK LG 6/CT PURPLE (CLIP) ×3 IMPLANT
CLIP VESOLOCK MED LG 6/CT (CLIP) ×2 IMPLANT
COVER SURGICAL LIGHT HANDLE (MISCELLANEOUS) ×2 IMPLANT
COVER TIP SHEARS 8 DVNC (MISCELLANEOUS) ×1 IMPLANT
COVER TIP SHEARS 8MM DA VINCI (MISCELLANEOUS) ×1
COVER WAND RF STERILE (DRAPES) IMPLANT
DECANTER SPIKE VIAL GLASS SM (MISCELLANEOUS) ×2 IMPLANT
DERMABOND ADVANCED (GAUZE/BANDAGES/DRESSINGS) ×1
DERMABOND ADVANCED .7 DNX12 (GAUZE/BANDAGES/DRESSINGS) ×1 IMPLANT
DEVICE TROCAR PUNCTURE CLOSURE (ENDOMECHANICALS) ×1 IMPLANT
DRAIN CHANNEL 19F RND (DRAIN) IMPLANT
DRAPE ARM DVNC X/XI (DISPOSABLE) ×4 IMPLANT
DRAPE COLUMN DVNC XI (DISPOSABLE) ×1 IMPLANT
DRAPE DA VINCI XI ARM (DISPOSABLE) ×4
DRAPE DA VINCI XI COLUMN (DISPOSABLE) ×1
DRAPE SHEET LG 3/4 BI-LAMINATE (DRAPES) IMPLANT
DRSG TEGADERM 6X8 (GAUZE/BANDAGES/DRESSINGS) ×2 IMPLANT
ELECT REM PT RETURN 15FT ADLT (MISCELLANEOUS) ×2 IMPLANT
GAUZE 4X4 16PLY RFD (DISPOSABLE) ×1 IMPLANT
GAUZE SPONGE 2X2 8PLY STRL LF (GAUZE/BANDAGES/DRESSINGS) ×1 IMPLANT
GAUZE SPONGE 4X4 12PLY STRL (GAUZE/BANDAGES/DRESSINGS) IMPLANT
GLOVE BIO SURGEON STRL SZ 6.5 (GLOVE) ×1 IMPLANT
GLOVE BIO SURGEON STRL SZ7.5 (GLOVE) ×4 IMPLANT
GLOVE BIO SURGEON STRL SZ8 (GLOVE) ×1 IMPLANT
GLOVE BIOGEL PI IND STRL 6.5 (GLOVE) IMPLANT
GLOVE BIOGEL PI INDICATOR 6.5 (GLOVE) ×1
GLOVE INDICATOR 8.0 STRL GRN (GLOVE) ×1 IMPLANT
GOWN STRL REUS W/TWL XL LVL3 (GOWN DISPOSABLE) ×7 IMPLANT
KIT BASIN OR (CUSTOM PROCEDURE TRAY) ×2 IMPLANT
KIT TURNOVER KIT A (KITS) ×1 IMPLANT
NDL INSUFFLATION 14GA 120MM (NEEDLE) ×1 IMPLANT
NEEDLE HYPO 22GX1.5 SAFETY (NEEDLE) ×2 IMPLANT
NEEDLE INSUFFLATION 14GA 120MM (NEEDLE) ×2 IMPLANT
OBTURATOR OPTICAL STANDARD 8MM (TROCAR)
OBTURATOR OPTICAL STND 8 DVNC (TROCAR)
OBTURATOR OPTICALSTD 8 DVNC (TROCAR) IMPLANT
PACK CARDIOVASCULAR III (CUSTOM PROCEDURE TRAY) ×2 IMPLANT
PAD POSITIONING PINK XL (MISCELLANEOUS) ×1 IMPLANT
POUCH RETRIEVAL ECOSAC 10 (ENDOMECHANICALS) ×1 IMPLANT
POUCH RETRIEVAL ECOSAC 10MM (ENDOMECHANICALS) ×1
PROTECTOR NERVE ULNAR (MISCELLANEOUS) ×2 IMPLANT
SCISSORS LAP 5X35 DISP (ENDOMECHANICALS) ×2 IMPLANT
SEAL CANN UNIV 5-8 DVNC XI (MISCELLANEOUS) ×4 IMPLANT
SEAL XI 5MM-8MM UNIVERSAL (MISCELLANEOUS) ×3
SEALER VESSEL DA VINCI XI (MISCELLANEOUS)
SEALER VESSEL EXT DVNC XI (MISCELLANEOUS) IMPLANT
SET IRRIG TUBING LAPAROSCOPIC (IRRIGATION / IRRIGATOR) ×2 IMPLANT
SOLUTION ELECTROLUBE (MISCELLANEOUS) ×2 IMPLANT
SPONGE GAUZE 2X2 STER 10/PKG (GAUZE/BANDAGES/DRESSINGS) ×1
SPONGE LAP 18X18 RF (DISPOSABLE) ×2 IMPLANT
SUT MNCRL AB 4-0 PS2 18 (SUTURE) ×3 IMPLANT
SUT PDS AB 1 CTX 36 (SUTURE) IMPLANT
SUT SILK 3 0 SH 30 (SUTURE) ×1 IMPLANT
SUT VICRYL 0 TIES 12 18 (SUTURE) ×1 IMPLANT
SYR 10ML ECCENTRIC (SYRINGE) ×2 IMPLANT
SYR 20CC LL (SYRINGE) ×2 IMPLANT
TAPE CLOTH 4X10 WHT NS (GAUZE/BANDAGES/DRESSINGS) ×2 IMPLANT
TOWEL OR 17X26 10 PK STRL BLUE (TOWEL DISPOSABLE) ×2 IMPLANT
TOWEL OR NON WOVEN STRL DISP B (DISPOSABLE) ×2 IMPLANT
TRAY FOLEY MTR SLVR 14FR STAT (SET/KITS/TRAYS/PACK) ×1 IMPLANT
TROCAR ADV FIXATION 5X100MM (TROCAR) ×2 IMPLANT
TUBING EVAC SMOKE HEATED PNEUM (TUBING) ×1 IMPLANT

## 2018-05-31 NOTE — Transfer of Care (Signed)
Immediate Anesthesia Transfer of Care Note  Patient: Maria Moss  Procedure(s) Performed: XI ROBOTIC RIGHT ADRENALECTOMY (Right Abdomen)  Patient Location: PACU  Anesthesia Type:General  Level of Consciousness: drowsy  Airway & Oxygen Therapy: Patient Spontanous Breathing and Patient connected to face mask oxygen  Post-op Assessment: Report given to RN and Post -op Vital signs reviewed and stable  Post vital signs: Reviewed and stable  Last Vitals:  Vitals Value Taken Time  BP 129/61 05/31/2018 10:40 AM  Temp    Pulse 102 05/31/2018 10:43 AM  Resp 19 05/31/2018 10:43 AM  SpO2 96 % 05/31/2018 10:43 AM  Vitals shown include unvalidated device data.  Last Pain:  Vitals:   05/31/18 0621  TempSrc:   PainSc: 0-No pain         Complications: No apparent anesthesia complications

## 2018-05-31 NOTE — Anesthesia Procedure Notes (Signed)
Procedure Name: Intubation Date/Time: 05/31/2018 7:23 AM Performed by: Eben Burow, CRNA Pre-anesthesia Checklist: Patient identified, Emergency Drugs available, Suction available and Patient being monitored Patient Re-evaluated:Patient Re-evaluated prior to induction Oxygen Delivery Method: Circle system utilized Preoxygenation: Pre-oxygenation with 100% oxygen Induction Type: IV induction and Rapid sequence Laryngoscope Size: Mac and 3 Grade View: Grade I Nasal Tubes: Nasal prep performed and Nasal Rae Tube size: 7.0 mm Placement Confirmation: ETT inserted through vocal cords under direct vision,  positive ETCO2 and breath sounds checked- equal and bilateral Secured at: 21 cm Tube secured with: Tape Dental Injury: Teeth and Oropharynx as per pre-operative assessment

## 2018-05-31 NOTE — Op Note (Signed)
05/31/2018  10:28 AM  PATIENT:  Maria Moss  68 y.o. female  PRE-OPERATIVE DIAGNOSIS:  RIGHT ADRENAL MASS  POST-OPERATIVE DIAGNOSIS:  RIGHT ADRENAL MASS  PROCEDURE:  Procedure(s): XI ROBOTIC RIGHT ADRENALECTOMY (Right)  SURGEON:  Surgeon(s) and Role:    * Ralene Ok, MD - Primary    Michael Boston, MD - Assisting  ANESTHESIA:   local and general  EBL:  100 mL   BLOOD ADMINISTERED:none  DRAINS: none   LOCAL MEDICATIONS USED:  BUPIVICAINE   SPECIMEN:  Source of Specimen:  Right Adrenal Mass  DISPOSITION OF SPECIMEN:  PATHOLOGY  COUNTS:  YES  TOURNIQUET:  * No tourniquets in log *  DICTATION: .Dragon Dictation Indication for procedure: Patient is a 68 year old female who 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  Patient underwent work-up by endocrinology and was found to be nonfunctioning.  Secondary to the size patient underwent right robotic colectomy.  Findings: Patient had adrenal vein x1.  Patient had a large soft mass adjacent to the right adrenal gland.  This was removed en bloc.  Details of procedure: After the patient was consented she was taken back to the OR and placed in supine position with bilateral SCDs in place.  Patient went general endotracheal intubation.  Patient then had a Foley catheter placed.  Patient prepped draped sterile fashion.  Timeout was called all facts verified.  A Veress needle technique was used to insufflate the abdomen to 15 m of mercury in the left costal margin.  Subsequent to this an 8 mm trocar was placed.  The robotic camera is in place intra-abdominal.  There was some adhesions to the right upper quadrant as well as right upper quadrant.  An 8 mm trochars placed in the supraumbilical midline space.  This was under direct visualization.  At this time I proceeded to take down the lower right lower quadrant adhesions.  This was done sharply.  This allowed for to a millimeter trochars to be placed in the right lower  quadrant.  The patient was positioned in reverse Trendelenburg with the right side up.  At this time the robot cart was brought to the patient's side and was docked.  Instruments were inserted.  At this time the thin adhesions to the previous cholecystectomy site were taken down sharply.  The duodenum was adherent to the gallbladder fossa.  This was taken down sharply.  There appeared to be a small serosal defect.  This was reapproximated using a 3-0 silk in a Lembert fashion.  The duodenum was able to be kocherized and was retracted inferiorly.  At this time proceeded to dissect posteriorly.  At this time I proceeded to visualize the right lateral border of the IVC.  I dissected this both bluntly and sharply in the superior direction.   There appeared to be a phrenic vein branch that was dissected out and clipped 2 proximally 1 distally.  It was transected.  Continued dissection superiorly.  At this time the right adrenal vein was visualized.  This was circumferentially dissected.  2 clips were placed proximally one distally.  This was also transected.  At this time using cautery and sharp dissection I was able to mobilize the right adrenal mass in the right adrenal gland away from the IVC medially.  The adrenal arteries were cauterized and transected.  We continued this dissection inferiorly.  We continued dissecting this off of the right psoas muscle heading in the superior direction.  With retraction there was some  thinning of the anterior capsule of the adrenal gland mass.  Were able to circumferentially dissect away the adrenal gland and the mass from the psoas, inferior to the liver, and the lateral aspect of the IVC.  Once this was done we able to visualize the space of the right renal gland.  It appeared to be hemostatic.  The area was irrigated out with sterile saline. At this time a eco-sac was placed into the abdomen.  The right adrenal gland and mesh were then placed into the eco-sac.  The  subhepatic fossa was irrigated out to the fluid was clear.  At this time a eco-sac and the mass were then removed.  The umbilical port site.  This was removed intact and sent to pathology.  At this time this trocar site was reapproximated using interrupted 0 Vicryl's.  The skin was reapproximated all port sites using 4-0 Monocryl subcuticular fashion.  The skin was dressed with Dermabond.  The patient taught the procedure well was taken to the recovery in stable condition.      PLAN OF CARE: Admit for overnight observation  PATIENT DISPOSITION:  PACU - hemodynamically stable.   Delay start of Pharmacological VTE agent (>24hrs) due to surgical blood loss or risk of bleeding: not applicable

## 2018-05-31 NOTE — Anesthesia Postprocedure Evaluation (Signed)
Anesthesia Post Note  Patient: Maria Moss  Procedure(s) Performed: XI ROBOTIC RIGHT ADRENALECTOMY (Right Abdomen)     Patient location during evaluation: PACU Anesthesia Type: General Level of consciousness: awake and alert Pain management: pain level controlled Vital Signs Assessment: post-procedure vital signs reviewed and stable Respiratory status: spontaneous breathing, nonlabored ventilation and respiratory function stable Cardiovascular status: blood pressure returned to baseline and stable Postop Assessment: no apparent nausea or vomiting Anesthetic complications: no    Last Vitals:  Vitals:   05/31/18 1315 05/31/18 1333  BP:  135/76  Pulse:  (!) 101  Resp:  16  Temp: (!) 36.4 C 36.6 C  SpO2:  100%    Last Pain:  Vitals:   05/31/18 1333  TempSrc: Oral  PainSc:                  Lidia Collum

## 2018-05-31 NOTE — H&P (Signed)
History of Present Illness The patient is a 68 year old female who presents wtih an adrenal mass. Patient is a 68 year old female who comes back in today for follow-up. Patient recently saw Dr. Renne Crigler to help rule out any active hormone secreting masses. Specifically patient's had a normal review aldosterone pathway, patient had normal 24-hour urine catecholamines and metanephrines. There is no evidence of pheochromocytoma. Patient went PET scan which I reviewed personally. This showed no signs of metastasis , Or metastatic disease.   ----------------------------------------------- Patient is a 68 year old female who is referred by Dr.Katragdd, for a chief complaint of adrenal mass. She has a history of hypertension, anxiety, and a right adrenal mass. Patient recently had right lower leg pain and underwent MRI, however lumbar spine. This time an MRI the patient right adrenal mass. Patient subsequently underwent PET scan. Patient's PET scan revealed a large right for 4.5 x 5 cm adrenal mass with possible central necrosis. I did review this scan personally. Patient did have a normal cortisol that was drawn. Patient's testosterone and levels were normal. Patient has not had a blood test to rule out pheochromocytoma.  Patient does state that she's currently on 3 blood pressure medications, Norvasc, enalapril, hydrochlorothiazide.. Patient states she denies any cardiac arrhythmias or histories of cardiac arrhythmias.  Past Medical History:  Diagnosis Date  . Ankle fracture, left 2006  . Diverticula, intestine    Mild case.  . Fibromyalgia    Dr. Justine Null Dx in [place] years ago  . GERD (gastroesophageal reflux disease)   . History of hiatal hernia   . Hyperlipidemia   . Hypertension    Dx age 29.     Past Surgical History:  Procedure Laterality Date  . ANKLE SURGERY Left    Broken.  Has screws and metal plate.  . CHOLECYSTECTOMY        Allergies  Penicillins  CODEINE   Allergies Reconciled   Medication History ( Amoxicillin-Pot Clavulanate (875-125MG  Tablet, Oral) Active. ALPRAZolam (0.25MG  Tablet, Oral) Active. traMADol HCl (50MG  Tablet, Oral) Active. amLODIPine Besylate (5MG  Tablet, Oral) Active. Enalapril Maleate (20MG  Tablet, Oral) Active. Pantoprazole Sodium (40MG  Tablet DR, Oral) Active. Meloxicam (15MG  Tablet, Oral) Active. hydroCHLOROthiazide (25MG  Tablet, Oral) Active. Baclofen (10MG  Tablet, Oral) Active. Medications Reconciled    Review of Systems  General Present- Fatigue. Not Present- Appetite Loss, Chills, Fever, Night Sweats, Weight Gain and Weight Loss. Skin Present- Dryness. Not Present- Change in Wart/Mole, Hives, Jaundice, New Lesions, Non-Healing Wounds, Rash and Ulcer. HEENT Not Present- Earache, Hearing Loss, Hoarseness, Nose Bleed, Oral Ulcers, Ringing in the Ears, Seasonal Allergies, Sinus Pain, Sore Throat, Visual Disturbances, Wears glasses/contact lenses and Yellow Eyes. Respiratory Present- Snoring. Not Present- Bloody sputum, Chronic Cough, Difficulty Breathing and Wheezing. Breast Not Present- Breast Mass, Breast Pain, Nipple Discharge and Skin Changes. Cardiovascular Present- Leg Cramps and Swelling of Extremities. Not Present- Chest Pain, Difficulty Breathing Lying Down, Palpitations, Rapid Heart Rate and Shortness of Breath. Gastrointestinal Present- Abdominal Pain, Change in Bowel Habits, Indigestion and Nausea. Not Present- Bloating, Bloody Stool, Chronic diarrhea, Constipation, Difficulty Swallowing, Excessive gas, Gets full quickly at meals, Hemorrhoids, Rectal Pain and Vomiting. Female Genitourinary Present- Frequency, Nocturia and Pelvic Pain. Not Present- Painful Urination and Urgency. Musculoskeletal Present- Back Pain and Muscle Weakness. Not Present- Joint Pain, Joint Stiffness, Muscle Pain and Swelling of Extremities. Neurological Present- Trouble walking. Not Present- Decreased Memory, Fainting,  Headaches, Numbness, Seizures, Tingling, Tremor and Weakness. Psychiatric Present- Depression. Not Present- Anxiety, Bipolar, Change in Sleep Pattern, Fearful and Frequent  crying. Endocrine Present- Hair Changes. Not Present- Cold Intolerance, Excessive Hunger, Heat Intolerance, Hot flashes and New Diabetes. Hematology Present- Easy Bruising. Not Present- Blood Thinners, Excessive bleeding, Gland problems, HIV and Persistent Infections. BP 138/81   Pulse 91   Temp 98.2 F (36.8 C) (Oral)   Resp 18   Ht 5\' 1"  (1.549 m)   Wt 70 kg   SpO2 96%   BMI 29.17 kg/m     Physical Exam  The physical exam findings are as follows: Note: Constitutional: No acute distress, conversant, appears stated age  Eyes: Anicteric sclerae, moist conjunctiva, no lid lag  Neck: No thyromegaly, trachea midline, no cervical lymphadenopathy  Lungs: Clear to auscultation biilaterally, normal respiratory effot  Cardiovascular: regular rate & rhythm, no murmurs, no peripheal edema, pedal pulses 2+  GI: Soft, no masses or hepatosplenomegaly, non-tender to palpation  MSK: Normal gait, no clubbing cyanosis, edema  Skin: No rashes, palpation reveals normal skin turgor  Psychiatric: Appropriate judgment and insight, oriented to person, place, and time    Assessment & Plan ADRENAL MASS, RIGHT (E27.8) Impression: Patient is a 68 year old female with a right adrenal mass, history of hypertension, history of anxiety. 1. We will proceed to the operating room for a robotic right adrenalectomy. 2. I discussed with her the risks and benefits of the procedure to include but not limited to: Infection, bleeding, damage to any structures, possible need for further surgery. The patient voiced understanding and wishes to proceed.

## 2018-06-01 ENCOUNTER — Encounter: Payer: Self-pay | Admitting: *Deleted

## 2018-06-01 ENCOUNTER — Encounter (HOSPITAL_COMMUNITY): Payer: Self-pay | Admitting: General Surgery

## 2018-06-01 LAB — BASIC METABOLIC PANEL
Anion gap: 7 (ref 5–15)
BUN: 18 mg/dL (ref 8–23)
CO2: 25 mmol/L (ref 22–32)
Calcium: 8.6 mg/dL — ABNORMAL LOW (ref 8.9–10.3)
Chloride: 109 mmol/L (ref 98–111)
Creatinine, Ser: 0.93 mg/dL (ref 0.44–1.00)
GFR calc Af Amer: >60 mL/min (ref >60)
GFR calc non Af Amer: >60 mL/min (ref >60)
Glucose, Bld: 118 mg/dL — ABNORMAL HIGH (ref 70–99)
Potassium: 3.6 mmol/L (ref 3.5–5.1)
Sodium: 141 mmol/L (ref 135–145)

## 2018-06-01 LAB — CBC
HCT: 28.4 % — ABNORMAL LOW (ref 36.0–46.0)
Hemoglobin: 8.7 g/dL — ABNORMAL LOW (ref 12.0–15.0)
MCH: 27.3 pg (ref 26.0–34.0)
MCHC: 30.6 g/dL (ref 30.0–36.0)
MCV: 89 fL (ref 80.0–100.0)
Platelets: 364 10*3/uL (ref 150–400)
RBC: 3.19 MIL/uL — ABNORMAL LOW (ref 3.87–5.11)
RDW: 13.2 % (ref 11.5–15.5)
WBC: 10.8 10*3/uL — ABNORMAL HIGH (ref 4.0–10.5)
nRBC: 0 % (ref 0.0–0.2)

## 2018-06-01 MED ORDER — TRAMADOL HCL 50 MG PO TABS: 50.0000 mg | ORAL_TABLET | Freq: Four times a day (QID) | ORAL | 0 refills | Status: DC | PRN

## 2018-06-01 NOTE — Progress Notes (Signed)
   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  Patient underwent robotic right adrenalectomy.  Please see operative note for full details.  Postoperatively patient did well.  Patient was transitioned from a liquid diet to a regular diet.  She tolerated this well.  Patient had good pain control.  Patient was otherwise afebrile, ambulating on her own, she was deemed stable for discharge and discharged home.   A post discharge chart review was performed today and further action regarding Transitional Care Management is not necessary at this time.   Chong Sicilian, RN-BC, BSN Nurse Care Manager [facility] [phone]

## 2018-06-01 NOTE — Discharge Instructions (Signed)
Adrenalectomy, Care After Refer to this sheet in the next few weeks. These instructions provide you with information about caring for yourself after your procedure. Your health care provider may also give you more specific instructions. Your treatment has been planned according to current medical practices, but problems sometimes occur. Call your health care provider if you have any problems or questions after your procedure. What can I expect after the procedure? If you had a tube down your throat, your throat may feel sore. Follow these instructions at home: Medicines  Take over-the-counter and prescription medicines only as told by your health care provider.  Do not drive for 24 hours if you received a sedative.  Do not drive or operate heavy machinery while taking prescription pain medicine or medicine for nausea. Activity  Do not lift anything that is heavier than 10 lb (4.5 kg).  Return to your normal activities as told by your health care provider. Ask your health care provider what activities are safe for you. Incision care  Follow instructions from your health care provider about how to take care of your surgical cuts (incisions). Make sure you: ? Wash your hands with soap and water before you change your bandage (dressing). If soap and water are not available, use hand sanitizer. ? Change your dressing as told by your health care provider. ? Leave stitches (sutures) or staples in place. They may need to stay in place for 2 weeks or longer.  Check your incision area(s) every day for signs of infection. Check for: ? More redness, swelling, or pain. ? More fluid or blood. ? Warmth. ? Pus or a bad smell. General instructions  Follow the diet that your health care provider prescribed for you.  Weigh yourself and take your blood pressure every day if told by your health care provider. Your health care provider will tell you what results should be reported right away.  Keep all  follow-up visits as told by your health care provider. This is important. Contact a health care provider if:  You have more redness, swelling, or pain around an incision.  You have more fluid or blood coming from an incision.  Your incision feels warm to the touch.  You have pus or a bad smell coming from an incision or a dressing.  You have a fever or chills.  Your pain does not improve with the pain medicines you have been given.  You cannot take your medicines for any reason.  You feel nauseous or you vomit.  You are constipated or you have diarrhea.  You develop a rash.  You develop a new cough.  You have problems with your urine, such as: ? Pain or burning with urination. ? A need to urinate more often than usual. ? Blood in your urine. Get help right away if:  You have chest pain.  You have shortness of breath.  You suddenly feel too weak or dizzy to stand or walk.  You have a severe increase in pain.  You have pain, tenderness, or redness in your calf.  Your incision or incisions open up. The dressing on an incision may become soaked with blood. This information is not intended to replace advice given to you by your health care provider. Make sure you discuss any questions you have with your health care provider. Document Released: 04/07/2010 Document Revised: 05/29/2015 Document Reviewed: 06/10/2014 Elsevier Interactive Patient Education  2019 [employer].

## 2018-06-01 NOTE — Discharge Summary (Signed)
Physician Discharge Summary  Patient ID: Maria Moss MRN: [id number] DOB/AGE: 05-Aug-1950 68 y.o.  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  Patient underwent robotic right adrenalectomy.  Please see operative note for full details.  Postoperatively patient did well.  Patient was transitioned from a liquid diet to a regular diet.  She tolerated this well.  Patient had good pain control.  Patient was otherwise afebrile, ambulating on her own, she was deemed stable for discharge and discharged home.  Consults: None  Significant Diagnostic Studies: None  Treatments: surgery: As above  Discharge Exam: Blood pressure 118/67, pulse 89, temperature 98.2 F (36.8 C), temperature source Oral, resp. rate 16, height 5\' 1"  (1.549 m), weight 70 kg, SpO2 90 %. General appearance: alert and cooperative GI: soft, non-tender; bowel sounds normal; no masses,  no organomegaly and inc c d/i  Disposition: Discharge disposition: 01-Home or Self Care       Discharge Instructions    Diet - low sodium heart healthy   Complete by:  As directed    Increase activity slowly   Complete by:  As directed      Allergies as of 06/01/2018      Reactions   Codeine Nausea And Vomiting   Sulfa Antibiotics    Hair loss, yellowed skin   Doxycycline Hyclate Itching      Medication List    TAKE these medications   acetaminophen 500 MG tablet Commonly known as:  TYLENOL Take 500 mg by mouth every 8 (eight) hours as needed for headache.   ALPRAZolam 0.25 MG tablet Commonly known as:  XANAX Take 1 tablet (0.25 mg total) by mouth at bedtime. What changed:    when to take this  reasons to take this   amLODipine 5 MG tablet Commonly known as:  NORVASC TAKE 1 TABLET  EVERY DAY   baclofen 10 MG tablet Commonly known as:  LIORESAL TAKE 1 TABLET (10 MG) AT BEDTIME AS NEEDED FOR MUSCLE SPASMS What changed:  See the new instructions.   cetirizine 10 MG tablet Commonly known as:  ZYRTEC Take 10 mg by mouth daily.   DULoxetine 60 MG capsule Commonly known as:  CYMBALTA Take 2 capsules (120 mg total) by mouth daily. What changed:  how much to take   enalapril 20 MG tablet Commonly known as:  VASOTEC TAKE 1 AND 1/2 TABLETS EVERY DAY   famotidine 20 MG tablet Commonly known as:  PEPCID Take 20 mg by mouth daily.   Fish Oil 1000 MG Caps Take 2,000 mg by mouth daily.   fluticasone 50 MCG/ACT nasal spray Commonly known as:  FLONASE USE 2 SPRAYS IN EACH NOSTRIL EVERY DAY   hydrochlorothiazide 25 MG tablet Commonly known as:  HYDRODIURIL TAKE 1 TABLET EVERY DAY   hyoscyamine 0.125 MG Tbdp disintergrating tablet Commonly known as:  ANASPAZ Take 1 tablet (0.125 mg total) by mouth every 6 (six) hours as needed. What changed:  reasons to take this   meloxicam 15 MG tablet Commonly known as:  MOBIC TAKE 1 TABLET EVERY DAY   ondansetron 4 MG disintegrating tablet Commonly known as:  Zofran ODT Take 1 tablet (4 mg total) by mouth every 8 (eight) hours as needed for nausea or vomiting.   pantoprazole 40 MG tablet  Commonly known as:  PROTONIX Take 1 tablet (40 mg total) by mouth daily. What changed:  when to take this   PROBIOTIC & ACIDOPHILUS EX ST PO Take 1 capsule by mouth at bedtime.   QUEtiapine 50 MG tablet Commonly known as:  SEROQUEL Take 1 tablet (50 mg total) by mouth at bedtime.   traMADol 50 MG tablet Commonly known as:  ULTRAM Take 1 tablet (50 mg total) by mouth 3 (three) times daily as needed. What changed:    when to take this  additional instructions   traMADol 50 MG tablet Commonly known as:  ULTRAM Take 1 tablet (50 mg total) by mouth every 6 (six) hours as needed (mild pain). What changed:  You were already  taking a medication with the same name, and this prescription was added. Make sure you understand how and when to take each.   trolamine salicylate 10 % cream Commonly known as:  ASPERCREME Apply 1 application topically 3 (three) times daily as needed for muscle pain.   Vitamin D-1000 Max St 25 MCG (1000 UT) tablet Generic drug:  Cholecalciferol Take 1,000 Units by mouth daily.        Signed: Ralene Ok 06/01/2018, 8:14 AM

## 2018-06-02 ENCOUNTER — Encounter: Payer: Self-pay | Admitting: *Deleted

## 2018-06-02 NOTE — Progress Notes (Signed)
   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   Chong Sicilian, RN-BC, BSN Nurse Care Manager [facility] [phone]

## 2018-06-13 ENCOUNTER — Telehealth: Payer: Self-pay | Admitting: Family Medicine

## 2018-06-13 NOTE — Telephone Encounter (Signed)
Patient will keep a record of blood pressures and check back with her surgeon.  Aware , if any symptoms of weakness, dizziness,  Headaches or other, seek medical attention.

## 2018-06-13 NOTE — Telephone Encounter (Signed)
Pt notified to keep record of BP If dizzziness or lightheadedness pt will call

## 2018-06-22 ENCOUNTER — Other Ambulatory Visit: Payer: Self-pay | Admitting: Family Medicine

## 2018-06-22 DIAGNOSIS — R5383 Other fatigue: Secondary | ICD-10-CM

## 2018-06-22 DIAGNOSIS — G2581 Restless legs syndrome: Secondary | ICD-10-CM

## 2018-07-03 ENCOUNTER — Telehealth: Payer: Self-pay | Admitting: Family Medicine

## 2018-07-03 DIAGNOSIS — F411 Generalized anxiety disorder: Secondary | ICD-10-CM

## 2018-07-03 NOTE — Telephone Encounter (Signed)
Pt aware that [facility] with [facility] that she has 1 refill left at the pharmacy

## 2018-07-03 NOTE — Telephone Encounter (Signed)
What is the name of the medication? xanax  Have you contacted your pharmacy to request a refill? Yes, was told to contact PCP  Which pharmacy would you like this sent to[facility] [place] [facility] appt on  08/04/2018  Patient notified that their request is being sent to the clinical staff for review and that they should receive a call once it is complete. If they do not receive a call within 24 hours they can check with their pharmacy or our office.

## 2018-07-05 ENCOUNTER — Other Ambulatory Visit: Payer: Self-pay | Admitting: Family Medicine

## 2018-07-21 ENCOUNTER — Ambulatory Visit: Payer: Medicare HMO | Admitting: Internal Medicine

## 2018-07-21 ENCOUNTER — Other Ambulatory Visit: Payer: Self-pay

## 2018-07-21 ENCOUNTER — Encounter: Payer: Self-pay | Admitting: Internal Medicine

## 2018-07-21 VITALS — BP 118/80 | HR 89 | Ht 61.0 in | Wt 149.0 lb

## 2018-07-21 DIAGNOSIS — E559 Vitamin D deficiency, unspecified: Secondary | ICD-10-CM | POA: Diagnosis not present

## 2018-07-21 DIAGNOSIS — E278 Other specified disorders of adrenal gland: Secondary | ICD-10-CM | POA: Diagnosis not present

## 2018-07-21 NOTE — Patient Instructions (Addendum)
Please come back for labs fasting, at 8-9 am, and skip the medicines before labs.  For now, stay on 3000 units vitamin D.  Please come back for a follow-up appointment in 6 months.

## 2018-07-21 NOTE — Progress Notes (Addendum)
Patient ID: [name], female   DOB: 08-28-1950, 68 y.o.   MRN: [id number]    HPI  Maria Moss is a 68 y.o.-year-old female, referred by Dr. 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  Last visit 6 months ago.  She had an adrenal mass found incidentally during investigation for leg pain and problems walking.  At that time, she saw orthopedics will check a lumbar MRI that showed a large R adrenal mass.  She was referred to surgery who referred the patient to endocrinology to clear her for surgery.  She had right adrenalectomy 06/01/2018. She is feeling well after the Sx and R leg pain is improved. No N/V, weight loss. She is fatigued, but this is not new for her.  I reviewed patient's previous imaging test reports: Lumbar MRI (10/28/2017): 5.2 x 4.4 R renal or adrenal heterogeneous soft tissue mass  CT abdomen w/ and w/o contrast (11/04/2017): R adrenal 5 cm complex necrotic appearing and enhancing mass with nodular areas of contrast enhancement and scattered areas of calcification >> possible RCC vs. metastasis  PET CT (11/18/2017):  CHEST: No hypermetabolic mediastinal or hilar nodes. No suspicious pulmonary nodules on the CT scan. ABDOMEN/PELVIS: Large RIGHT renal mass again noted measuring 5.0 by 4.5 cm. Mass has central photopenia and mild peripheral metabolic activity with SUV max equal 3.1 (along the medial border of the mass for example.) This mild activity is similar to liver activity (SUV max equal 3.8). The LEFT adrenal glands normal. No other sites of abnormal FDG uptake.  IMPRESSION: 1. Large RIGHT adrenal mass with central photopenia and mild peripheral metabolic activity. Findings are nonspecific. Recommend surgical consultation for lesions of this size. 2. No evidence of primary or metastatic carcinoma outside of the adrenal gland.  Of note, she did have a normal testosterone, estradiol, 24-hour urine cortisol but she had a  slightly low ACTH: Component     Latest Ref Rng & Units 11/10/2017 11/13/2017  Urine Total Volume-UCRE24     mL  2,175  Collection Interval-UCRE24     hours  24  Creatinine, Urine     mg/dL  43.15  Creatinine, 24H Ur     600 - 1,800 mg/day  939  Cortisol (Ur), Free     6 - 42 ug/24 hr  9  Total Volume       2,175  Cortisol,F,ug/L,U     Undefined ug/L  4  Cortisol, Plasma     ug/dL 4.9   Testosterone     3 - 41 ng/dL <3 (L)   Estradiol     pg/mL <5.0   C206 ACTH     7.2 - 63.3 pg/mL 6.5 (L)    No history of hypokalemia.  Aldosterone was not elevated: Component     Latest Ref Rng & Units 01/31/2018  ALDOSTERONE      ng/dL <1  Renin Activity     0.25 - 5.82 ng/mL/h 9.86 (H)  ALDO / PRA Ratio      see note   Also, cortisol was appropriately suppressed after dexamethasone, with no evidence of Cushing syndrome: Component     Latest Ref Rng & Units 02/03/2018  Dexamethasone, Serum     ng/dL >1,000  Cortisol - AM     mcg/dL 2.2 (L)   Plasma normetanephrine was elevated, which is nonspecific and mild: Component     Latest Ref Rng & Units 01/31/2018  Epinephrine     pg/mL  34  Norepinephrine     pg/mL 1,073  Dopamine     pg/mL 58  Catecholamines, Total     pg/mL 1,107  Metanephrine, Pl     <=57 pg/mL <25  Normetanephrine, Pl     <=148 pg/mL 232 (H)  Total Metanephrines-Plasma     <=205 pg/mL 232 (H)  Potassium     3.5 - 5.1 mEq/L 4.3   Because of this, we proceeded with a 24-hour urine collection since this is more specific.this was normal: Component     Latest Ref Rng & Units 02/16/2018 02/16/2018        11:51 AM 11:51 AM  Volume, Urine-VMAUR     mL 2,200 2,200  Epinephrine, 24 hr Urine      see note   Norepinephrine, 24 hr Ur     15 - 100 mcg/24 h 8 (L)   Calculated Total (E+NE)     26 - 121 mcg/24 h 8 (L)   Dopamine, 24 hr Urine      see note   Creatinine, Urine mg/day-CATEUR     0.50 - 2.15 g/24 h 0.77   Metanephrines, Ur     90 - 315 mcg/24 h  97   Normetanephr.,U,24h     122 - 676 mcg/24 h  407  Metanephrine, Ur     224 - 832 mcg/24 h  504  Creatinine, 24H Ur     0.50 - 2.15 g/24 h 0.79   Therefore, we ruled out pheochromocytoma.  She had right adrenalectomy by Dr. 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  She feels well after the surgery, without complaints.  She also has a history of vitamin D deficiency: Lab Results  Component Value Date   VD25OH 29.1 (L) 04/14/2017   VD25OH 43.9 11/02/2013  At last visit, I advised her to restart vitamin D at 3000 units daily.  Reviewing her chart, she also has a history of slightly elevated calcium levels with inappropriately suppressed PTH: Lab Results  Component Value Date   PTH 41 11/10/2017   PTH Comment 11/10/2017   Lab Results  Component Value Date   CALCIUM 8.6 (L) 06/01/2018   CALCIUM 9.9 05/22/2018   CALCIUM 10.3 01/27/2018   CALCIUM 10.5 (H) 11/10/2017   CALCIUM 10.4 (H) 10/21/2017   CALCIUM 10.5 (H) 06/02/2017   CALCIUM 10.4 (H) 04/14/2017   CALCIUM 10.4 (H) 12/16/2016   CALCIUM 10.5 (H) 09/24/2016   CALCIUM 10.6 (H) 09/10/2016   CALCIUM 10.2 06/11/2016   CALCIUM 10.3 03/05/2016   CALCIUM 9.8 11/28/2015   CALCIUM 10.3 08/29/2015   CALCIUM 10.1 03/21/2015   CALCIUM 10.4 (H) 11/29/2014   CALCIUM 9.9 05/17/2014   CALCIUM 10.3 02/15/2014   CALCIUM 10.3 07/19/2013   CALCIUM 10.0 03/19/2013   CALCIUM 10.6 (H) 11/22/2012   CALCIUM 10.7 (H) 07/21/2012   At last visit she was taking Tums for acid reflux and I advised her to stop these to be able to further investigate her parathyroid status.  She has a history of osteopenia per review of the DXA scan from 2015 but no kidney stones.  DEXA scan ([facility]) from 10/24/2016: L1-L4: -0.5 RFN: -1.0 LFN: -1.2  ROS: Constitutional: no weight gain/no weight loss, + fatigue, + subjective hyperthermia, no subjective hypothermia, + poor sleep, + nocturia Eyes: no blurry vision, no  xerophthalmia ENT: no sore throat, no nodules palpated in neck, no dysphagia, no odynophagia, no hoarseness Cardiovascular: no CP/no SOB/no palpitations/+ leg swelling Respiratory: no  cough/no SOB/no wheezing Gastrointestinal: no N/no V/no D/no C/+ acid reflux Musculoskeletal: + Muscle aches/+ joint aches Skin: no rashes, + hair loss Neurological: no tremors/no numbness/no tingling/no dizziness  I reviewed pt's medications, allergies, PMH, social hx, family hx, and changes were documented in the history of present illness. Otherwise, unchanged from my initial visit note.  Past Medical History:  Diagnosis Date  . Ankle fracture, left 2006  . Diverticula, intestine    Mild case.  . Fibromyalgia    Dr. Justine Null Dx in [place] years ago  . GERD (gastroesophageal reflux disease)   . History of hiatal hernia   . Hyperlipidemia   . Hypertension    Dx age 45.     Past Surgical History:  Procedure Laterality Date  . ANKLE SURGERY Left    Broken.  Has screws and metal plate.  . CHOLECYSTECTOMY    . ROBOTIC ADRENALECTOMY Right 05/31/2018   Procedure: XI ROBOTIC RIGHT ADRENALECTOMY;  Surgeon: Ralene Ok, MD;  Location: [facility];  Service: General;  Laterality: Right;    Social History   Socioeconomic History  . Marital status: Married    Spouse name: Not on file  . Number of children: Not on file  . Years of education: Not on file  . Highest education level: Not on file  Occupational History  . Not on file  Social Needs  . Financial resource strain: Not on file  . Food insecurity    Worry: Not on file    Inability: Not on file  . Transportation needs    Medical: Not on file    Non-medical: Not on file  Tobacco Use  . Smoking status: Never Smoker  . Smokeless tobacco: Never Used  Substance and Sexual Activity  . Alcohol use: No  . Drug use: No  . Sexual activity: Not on file  Lifestyle  . Physical activity    Days per week: Not on file    Minutes per session: Not on  file  . Stress: Not on file  Relationships  . Social Herbalist on phone: Not on file    Gets together: Not on file    Attends religious service: Not on file    Active member of club or organization: Not on file    Attends meetings of clubs or organizations: Not on file    Relationship status: Not on file  . Intimate partner violence    Fear of current or ex partner: Not on file    Emotionally abused: Not on file    Physically abused: Not on file    Forced sexual activity: Not on file  Other Topics Concern  . Not on file  Social History Narrative  . Not on file    Current Outpatient Medications on File Prior to Visit  Medication Sig Dispense Refill  . acetaminophen (TYLENOL) 500 MG tablet Take 500 mg by mouth every 8 (eight) hours as needed for headache.    . ALPRAZolam (XANAX) 0.25 MG tablet Take 1 tablet (0.25 mg total) by mouth at bedtime. (Patient taking differently: Take 0.25 mg by mouth at bedtime as needed for anxiety or sleep. ) 30 tablet 2  . amLODipine (NORVASC) 5 MG tablet TAKE 1 TABLET EVERY DAY 90 tablet 3  . baclofen (LIORESAL) 10 MG tablet TAKE 1 TABLET (10 MG) AT BEDTIME AS NEEDED FOR MUSCLE SPASMS 90 tablet 0  . cetirizine (ZYRTEC) 10 MG tablet Take 10 mg by mouth daily.    Marland Kitchen  Cholecalciferol (VITAMIN D-1000 MAX ST) 1000 units tablet Take 1,000 Units by mouth daily.     . DULoxetine (CYMBALTA) 60 MG capsule Take 2 capsules (120 mg total) by mouth daily. (Patient taking differently: Take 60 mg by mouth daily. ) 180 capsule 1  . enalapril (VASOTEC) 20 MG tablet TAKE 1 AND 1/2 TABLETS EVERY DAY 135 tablet 3  . famotidine (PEPCID) 20 MG tablet Take 20 mg by mouth daily.    . fluticasone (FLONASE) 50 MCG/ACT nasal spray USE 2 SPRAYS IN EACH NOSTRIL EVERY DAY 48 g 5  . hydrochlorothiazide (HYDRODIURIL) 25 MG tablet TAKE 1 TABLET EVERY DAY 90 tablet 3  . hyoscyamine (ANASPAZ) 0.125 MG TBDP disintergrating tablet Take 1 tablet (0.125 mg total) by mouth every 6  (six) hours as needed. (Patient taking differently: Take 0.125 mg by mouth every 6 (six) hours as needed for bladder spasms or cramping. ) 30 tablet 4  . meloxicam (MOBIC) 15 MG tablet TAKE 1 TABLET EVERY DAY 90 tablet 0  . Omega-3 Fatty Acids (FISH OIL) 1000 MG CAPS Take 2,000 mg by mouth daily.     . ondansetron (ZOFRAN ODT) 4 MG disintegrating tablet Take 1 tablet (4 mg total) by mouth every 8 (eight) hours as needed for nausea or vomiting. 30 tablet 2  . pantoprazole (PROTONIX) 40 MG tablet Take 1 tablet (40 mg total) by mouth daily. (Patient taking differently: Take 40 mg by mouth at bedtime. ) 90 tablet 3  . Probiotic Product (PROBIOTIC & ACIDOPHILUS EX ST PO) Take 1 capsule by mouth at bedtime.     Marland Kitchen QUEtiapine (SEROQUEL) 50 MG tablet Take 1 tablet (50 mg total) by mouth at bedtime. 90 tablet 1  . traMADol (ULTRAM) 50 MG tablet Take 1 tablet (50 mg total) by mouth 3 (three) times daily as needed. (Patient taking differently: Take 50 mg by mouth See admin instructions. Take 50 mg twice daily, may take a third 50 mg dose midday as needed for pain) 90 tablet 2  . traMADol (ULTRAM) 50 MG tablet Take 1 tablet (50 mg total) by mouth every 6 (six) hours as needed (mild pain). 20 tablet 0  . trolamine salicylate (ASPERCREME) 10 % cream Apply 1 application topically 3 (three) times daily as needed for muscle pain.     No current facility-administered medications on file prior to visit.     Allergies  Allergen Reactions  . Codeine Nausea And Vomiting  . Sulfa Antibiotics     Hair loss, yellowed skin  . Doxycycline Hyclate Itching    Family History  Problem Relation Age of Onset  . Heart disease Mother        CHF  . Cancer Mother        Uterine  . Osteoporosis Mother   . COPD Sister   . Asthma Sister   . Migraines Sister     PE: BP 118/80   Pulse 89   Ht 5\' 1"  (1.549 m)   Wt 149 lb (67.6 kg)   SpO2 98%   BMI 28.15 kg/m  Wt Readings from Last 3 Encounters:  07/21/18 149 lb (67.6  kg)  05/31/18 154 lb 6 oz (70 kg)  05/22/18 154 lb 6 oz (70 kg)   Constitutional: normal weight, in NAD Eyes: PERRLA, EOMI, no exophthalmos ENT: moist mucous membranes, no thyromegaly, no cervical lymphadenopathy Cardiovascular: RRR, No MRG Respiratory: CTA B Gastrointestinal: abdomen soft, NT, ND, BS+ Musculoskeletal: no deformities, strength intact in all 4 Skin: moist, warm,  no rashes Neurological: no tremor with outstretched hands, DTR normal in all 4  ASSESSMENT: 1. Adrenal incidentaloma  2.  Hypercalcemia  3.  Vitamin D deficiency  PLAN:  1. Patient with a right adrenal nodule discovered incidentally this had necrotic areas and scattered areas of calcification, measuring 5 x 4.5 cm. Per review of her CT abdomen report, this may indicate adrenal cortical cancer versus metastasis, however, the PET/CT did not show increased FDG activity to suggest malignancy.  However, the finding was nonspecific.  At last visit, we ruled out adrenal hormone production and I then cleared her for surgery.  She had less almost 2 months ago and the pathology was benign (cavernous hemangioma).  After surgery, she is happy that her right leg pain has resolved. -At this visit, discussed about checking her cortisol again to make sure she did not develop insufficiency, but clinically she does not have any signs or symptoms of adrenal insufficiency other than fatigue, which she has had before the surgery. -She will return for these tests fasting, in a.m.  2.  Hypercalcemia -She has had mildly elevated calcium levels in the past, however, she was taking a calcium supplement at last visit (Tums) and I advised her to stop this before we investigated her parathyroid function further.  I also advised her to restart her vitamin D which she stopped before seeing me. -Of note, she did have a normal PTH level, however, this appears to be inappropriately normal in the setting of a high calcium -We will need to repeat  this after we make sure that her vitamin D level is normal -Of note, she does have a history of osteopenia, but not osteoporosis per review of the DXA scan report from 2015. She remembers having had another DXA scan last year >> will get records.  No history of kidney stones.  3.  Vitamin D deficiency -Reviewed latest vitamin D level which was slightly low in 2019 >> we restarted vitamin D at 3000 units daily -We will recheck the level when she comes back for the cortisol test and if normal, we can go ahead with the parathyroid investigation  Please come back for a follow-up appointment in 6 months.  Orders Placed This Encounter  Procedures  . VITAMIN D 25 Hydroxy (Vit-D Deficiency, Fractures)  . ACTH  . Cortisol   Component     Latest Ref Rng & Units 07/28/2018  Cortisol, Plasma     ug/dL 15.4  VITD     30.00 - 100.00 ng/mL 62.95  ACTH pending.  Cortisol and vitamin D levels normal.  Addendum: ACTH slightly high with a normal cortisol. Will need to have her back for another cortisol and ACTH level and also a stim test.  Component     Latest Ref Rng & Units 09/01/2018 09/01/2018 09/01/2018         9:45 AM 10:30 AM 10:58 AM  C206 ACTH     6 - 50 pg/mL 36    Cortisol, Plasma     ug/dL 12.3 19.7 22.4  Cosyntropin stimulation test normal.  Philemon Kingdom, MD PhD [facility] Endocrinology

## 2018-07-28 ENCOUNTER — Other Ambulatory Visit: Payer: Self-pay

## 2018-07-28 ENCOUNTER — Other Ambulatory Visit (INDEPENDENT_AMBULATORY_CARE_PROVIDER_SITE_OTHER): Payer: Medicare HMO

## 2018-07-28 ENCOUNTER — Telehealth: Payer: Self-pay

## 2018-07-28 DIAGNOSIS — E559 Vitamin D deficiency, unspecified: Secondary | ICD-10-CM | POA: Diagnosis not present

## 2018-07-28 DIAGNOSIS — E278 Other specified disorders of adrenal gland: Secondary | ICD-10-CM | POA: Diagnosis not present

## 2018-07-28 LAB — CORTISOL: Cortisol, Plasma: 15.4 ug/dL

## 2018-07-28 LAB — VITAMIN D 25 HYDROXY (VIT D DEFICIENCY, FRACTURES): VITD: 62.95 ng/mL (ref 30.00–100.00)

## 2018-07-28 NOTE — Telephone Encounter (Signed)
Notified patient of message from Dr. Gherghe, patient expressed understanding and agreement. No further questions.  

## 2018-07-28 NOTE — Telephone Encounter (Signed)
-----   Message from Philemon Kingdom, MD sent at 07/28/2018  4:57 PM EDT ----- Lenna Sciara, can you please call pt: Vitamin D and cortisol levels are normal.  I am still waiting for 1 lab to return, and I will let her know if this is abnormal.

## 2018-07-31 LAB — ACTH: C206 ACTH: 59 pg/mL — ABNORMAL HIGH (ref 6–50)

## 2018-08-04 ENCOUNTER — Encounter: Payer: Self-pay | Admitting: Family Medicine

## 2018-08-04 ENCOUNTER — Ambulatory Visit (INDEPENDENT_AMBULATORY_CARE_PROVIDER_SITE_OTHER): Payer: Medicare HMO | Admitting: Family Medicine

## 2018-08-04 DIAGNOSIS — G4700 Insomnia, unspecified: Secondary | ICD-10-CM

## 2018-08-04 DIAGNOSIS — I10 Essential (primary) hypertension: Secondary | ICD-10-CM

## 2018-08-04 DIAGNOSIS — F411 Generalized anxiety disorder: Secondary | ICD-10-CM

## 2018-08-04 DIAGNOSIS — G8929 Other chronic pain: Secondary | ICD-10-CM | POA: Diagnosis not present

## 2018-08-04 DIAGNOSIS — K219 Gastro-esophageal reflux disease without esophagitis: Secondary | ICD-10-CM

## 2018-08-04 DIAGNOSIS — M545 Low back pain: Secondary | ICD-10-CM | POA: Diagnosis not present

## 2018-08-04 DIAGNOSIS — E782 Mixed hyperlipidemia: Secondary | ICD-10-CM | POA: Diagnosis not present

## 2018-08-04 MED ORDER — DULOXETINE HCL 60 MG PO CPEP: 60.0000 mg | ORAL_CAPSULE | Freq: Every day | ORAL | 1 refills | Status: DC

## 2018-08-04 MED ORDER — OLANZAPINE 5 MG PO TABS: 5.0000 mg | ORAL_TABLET | Freq: Every day | ORAL | 2 refills | Status: DC

## 2018-08-04 MED ORDER — PANTOPRAZOLE SODIUM 40 MG PO TBEC: 40.0000 mg | DELAYED_RELEASE_TABLET | Freq: Every day | ORAL | 3 refills | Status: DC

## 2018-08-04 MED ORDER — TRAMADOL HCL 50 MG PO TABS: 50.0000 mg | ORAL_TABLET | Freq: Three times a day (TID) | ORAL | 2 refills | Status: DC | PRN

## 2018-08-04 NOTE — Progress Notes (Signed)
Virtual Visit via telephone Note  I connected with Maria Moss on 08/04/18 at 0859 by telephone and verified that I am speaking with the correct person using two identifiers. Maria Moss is currently located at home and no other people are currently with her during visit. The provider, Fransisca Kaufmann Aizley Stenseth, MD is located in their office at time of visit.  Call ended at [phone]  I discussed the limitations, risks, security and privacy concerns of performing an evaluation and management service by telephone and the availability of in person appointments. I also discussed with the patient that there may be a patient responsible charge related to this service. The patient expressed understanding and agreed to proceed.   History and Present Illness: Patient is calling in for anxiety and she feels nervous in the evenings and she is not doing well with sleep and anxiousness.  She says that she does find through the day but in the evenings when she has a lot of issues.  She has trouble sleeping and sometimes she will stay up at night as well because of it.  She denies any suicidal ideations or thoughts of hurting self.  Pain assessment: Cause of pain-degenerative lumbar disc disease chronic Pain location-lower back Pain on scale of 1-10-3 Frequency-most days What increases pain-prolonged and prolonged standing What makes pain Better-medication and movement and stretching Effects on ADL -does not limit her too much at this point Any change in general medical condition-has been anemic more recently and recently had her adrenal gland removed for mass  Current opioids rx-tramadol 50 mg every 8 hours as needed # meds rx-90 Effectiveness of current meds-works well for the most part Adverse reactions form pain meds-none Morphine equivalent-15  Pill count performed-No Last drug screen -N/A ( high risk q32m moderate risk q624mlow risk yearly ) Urine drug screen today- No Was the N[place]ur[place]eview[place]-yes  If  yes were their any concerning findings? -None  No flowsheet data found.   Pain contract signed on: Not available, will do at next visit in person because this was virtual   Hypertension Patient is currently on amlodipine and enalapril and hydrochlorothiazide, and their blood pressure today is 120/70 at home. Patient denies any lightheadedness or dizziness. Patient denies headaches, blurred vision, chest pains, shortness of breath, or weakness. Denies any side effects from medication and is content with current medication.   Hyperlipidemia Patient is coming in for recheck of his hyperlipidemia. The patient is currently taking omega-3. They deny any issues with myalgias or history of liver damage from it. They deny any focal numbness or weakness or chest pain.   No diagnosis found.  Outpatient Encounter Medications as of 08/04/2018  Medication Sig   acetaminophen (TYLENOL) 500 MG tablet Take 500 mg by mouth every 8 (eight) hours as needed for headache.   ALPRAZolam (XANAX) 0.25 MG tablet Take 1 tablet (0.25 mg total) by mouth at bedtime. (Patient taking differently: Take 0.25 mg by mouth at bedtime as needed for anxiety or sleep. )   amLODipine (NORVASC) 5 MG tablet TAKE 1 TABLET EVERY DAY   baclofen (LIORESAL) 10 MG tablet TAKE 1 TABLET (10 MG) AT BEDTIME AS NEEDED FOR MUSCLE SPASMS   cetirizine (ZYRTEC) 10 MG tablet Take 10 mg by mouth daily.   Cholecalciferol (VITAMIN D-1000 MAX ST) 1000 units tablet Take 1,000 Units by mouth daily.    DULoxetine (CYMBALTA) 60 MG capsule Take 2 capsules (120 mg total) by mouth daily. (Patient taking differently: Take  60 mg by mouth daily. )   enalapril (VASOTEC) 20 MG tablet TAKE 1 AND 1/2 TABLETS EVERY DAY   famotidine (PEPCID) 20 MG tablet Take 20 mg by mouth daily.   fluticasone (FLONASE) 50 MCG/ACT nasal spray USE 2 SPRAYS IN EACH NOSTRIL EVERY DAY   hydrochlorothiazide (HYDRODIURIL) 25 MG tablet TAKE 1 TABLET EVERY DAY   hyoscyamine  (ANASPAZ) 0.125 MG TBDP disintergrating tablet Take 1 tablet (0.125 mg total) by mouth every 6 (six) hours as needed. (Patient taking differently: Take 0.125 mg by mouth every 6 (six) hours as needed for bladder spasms or cramping. )   meloxicam (MOBIC) 15 MG tablet TAKE 1 TABLET EVERY DAY   Omega-3 Fatty Acids (FISH OIL) 1000 MG CAPS Take 2,000 mg by mouth daily.    ondansetron (ZOFRAN ODT) 4 MG disintegrating tablet Take 1 tablet (4 mg total) by mouth every 8 (eight) hours as needed for nausea or vomiting. (Patient not taking: Reported on 07/21/2018)   pantoprazole (PROTONIX) 40 MG tablet Take 1 tablet (40 mg total) by mouth daily. (Patient taking differently: Take 40 mg by mouth at bedtime. )   Probiotic Product (PROBIOTIC & ACIDOPHILUS EX ST PO) Take 1 capsule by mouth at bedtime.    QUEtiapine (SEROQUEL) 50 MG tablet Take 1 tablet (50 mg total) by mouth at bedtime. (Patient not taking: Reported on 07/21/2018)   traMADol (ULTRAM) 50 MG tablet Take 1 tablet (50 mg total) by mouth every 6 (six) hours as needed (mild pain).   trolamine salicylate (ASPERCREME) 10 % cream Apply 1 application topically 3 (three) times daily as needed for muscle pain.   No facility-administered encounter medications on file as of 08/04/2018.     Review of Systems  Constitutional: Negative for chills and fever.  Eyes: Negative for redness and visual disturbance.  Respiratory: Negative for chest tightness and shortness of breath.   Cardiovascular: Negative for chest pain and leg swelling.  Musculoskeletal: Negative for back pain and gait problem.  Skin: Negative for rash.  Neurological: Negative for dizziness, light-headedness and headaches.  Psychiatric/Behavioral: Negative for agitation and behavioral problems.  All other systems reviewed and are negative.   Observations/Objective: Patient sounds comfortable and in no acute distress  Assessment and Plan: Problem List Items Addressed This Visit       Cardiovascular and Mediastinum   Hypertension - Primary (Chronic)   Relevant Orders   CMP14+EGFR     Other   Hyperlipemia (Chronic)   Relevant Orders   Lipid panel   GAD (generalized anxiety disorder) (Chronic)   Relevant Medications   DULoxetine (CYMBALTA) 60 MG capsule   OLANZapine (ZYPREXA) 5 MG tablet   Chronic low back pain   Relevant Medications   DULoxetine (CYMBALTA) 60 MG capsule   OLANZapine (ZYPREXA) 5 MG tablet   traMADol (ULTRAM) 50 MG tablet    Other Visit Diagnoses    Insomnia, unspecified type       Gastroesophageal reflux disease without esophagitis       Relevant Medications   pantoprazole (PROTONIX) 40 MG tablet   Other Relevant Orders   CBC with Differential/Platelet       Follow Up Instructions:  Stop Xanax because is not working for her and will try Zyprexa in the evenings, Seroquel did not work for, otherwise we will have to try other options.  Follow-up in 3 months  Continue patient's tramadol for back, continue Cymbalta  Continue all of her blood pressure medications without changes.   I discussed the  assessment and treatment plan with the patient. The patient was provided an opportunity to ask questions and all were answered. The patient agreed with the plan and demonstrated an understanding of the instructions.   The patient was advised to call back or seek an in-person evaluation if the symptoms worsen or if the condition fails to improve as anticipated.  The above assessment and management plan was discussed with the patient. The patient verbalized understanding of and has agreed to the management plan. Patient is aware to call the clinic if symptoms persist or worsen. Patient is aware when to return to the clinic for a follow-up visit. Patient educated on when it is appropriate to go to the emergency department.    I provided 24 minutes of non-face-to-face time during this encounter.    Worthy Rancher, MD

## 2018-08-17 ENCOUNTER — Other Ambulatory Visit: Payer: Self-pay

## 2018-08-17 ENCOUNTER — Other Ambulatory Visit: Payer: Medicare HMO

## 2018-08-17 DIAGNOSIS — I10 Essential (primary) hypertension: Secondary | ICD-10-CM

## 2018-08-17 DIAGNOSIS — E782 Mixed hyperlipidemia: Secondary | ICD-10-CM | POA: Diagnosis not present

## 2018-08-17 DIAGNOSIS — K219 Gastro-esophageal reflux disease without esophagitis: Secondary | ICD-10-CM | POA: Diagnosis not present

## 2018-08-18 LAB — CBC WITH DIFFERENTIAL/PLATELET
Basophils Absolute: 0 10*3/uL (ref 0.0–0.2)
Basos: 0 %
EOS (ABSOLUTE): 0.5 10*3/uL — ABNORMAL HIGH (ref 0.0–0.4)
Eos: 5 %
Hematocrit: 30.9 % — ABNORMAL LOW (ref 34.0–46.6)
Hemoglobin: 9.8 g/dL — ABNORMAL LOW (ref 11.1–15.9)
Immature Grans (Abs): 0 10*3/uL (ref 0.0–0.1)
Immature Granulocytes: 0 %
Lymphocytes Absolute: 2.3 10*3/uL (ref 0.7–3.1)
Lymphs: 22 %
MCH: 26.1 pg — ABNORMAL LOW (ref 26.6–33.0)
MCHC: 31.7 g/dL (ref 31.5–35.7)
MCV: 82 fL (ref 79–97)
Monocytes Absolute: 1.1 10*3/uL — ABNORMAL HIGH (ref 0.1–0.9)
Monocytes: 10 %
Neutrophils Absolute: 6.6 10*3/uL (ref 1.4–7.0)
Neutrophils: 63 %
Platelets: 512 10*3/uL — ABNORMAL HIGH (ref 150–450)
RBC: 3.75 x10E6/uL — ABNORMAL LOW (ref 3.77–5.28)
RDW: 13.5 % (ref 11.7–15.4)
WBC: 10.5 10*3/uL (ref 3.4–10.8)

## 2018-08-18 LAB — CMP14+EGFR
ALT: 17 IU/L (ref 0–32)
AST: 23 IU/L (ref 0–40)
Albumin/Globulin Ratio: 2 (ref 1.2–2.2)
Albumin: 4.2 g/dL (ref 3.8–4.8)
Alkaline Phosphatase: 141 IU/L — ABNORMAL HIGH (ref 39–117)
BUN/Creatinine Ratio: 19 (ref 12–28)
BUN: 30 mg/dL — ABNORMAL HIGH (ref 8–27)
Bilirubin Total: 0.5 mg/dL (ref 0.0–1.2)
CO2: 21 mmol/L (ref 20–29)
Calcium: 10.3 mg/dL (ref 8.7–10.3)
Chloride: 103 mmol/L (ref 96–106)
Creatinine, Ser: 1.59 mg/dL — ABNORMAL HIGH (ref 0.57–1.00)
GFR calc Af Amer: 39 mL/min/{1.73_m2} — ABNORMAL LOW (ref >59)
GFR calc non Af Amer: 34 mL/min/{1.73_m2} — ABNORMAL LOW (ref >59)
Globulin, Total: 2.1 g/dL (ref 1.5–4.5)
Glucose: 103 mg/dL — ABNORMAL HIGH (ref 65–99)
Potassium: 4.8 mmol/L (ref 3.5–5.2)
Sodium: 140 mmol/L (ref 134–144)
Total Protein: 6.3 g/dL (ref 6.0–8.5)

## 2018-08-18 LAB — LIPID PANEL
Chol/HDL Ratio: 3.2 ratio (ref 0.0–4.4)
Cholesterol, Total: 214 mg/dL — ABNORMAL HIGH (ref 100–199)
HDL: 67 mg/dL (ref >39)
LDL Calculated: 110 mg/dL — ABNORMAL HIGH (ref 0–99)
Triglycerides: 186 mg/dL — ABNORMAL HIGH (ref 0–149)
VLDL Cholesterol Cal: 37 mg/dL (ref 5–40)

## 2018-08-21 ENCOUNTER — Telehealth: Payer: Self-pay | Admitting: Family Medicine

## 2018-08-21 DIAGNOSIS — I10 Essential (primary) hypertension: Secondary | ICD-10-CM

## 2018-08-21 NOTE — Addendum Note (Signed)
Addended by: Philemon Kingdom on: 08/21/2018 12:21 PM   Modules accepted: Orders

## 2018-08-21 NOTE — Telephone Encounter (Signed)
Pt called and aware of labs  

## 2018-08-23 ENCOUNTER — Telehealth: Payer: Self-pay

## 2018-08-23 NOTE — Telephone Encounter (Signed)
-----   Message from Philemon Kingdom, MD sent at 08/21/2018 12:23 PM EDT ----- Lenna Sciara, can you please call pt: The last test we checked is very slightly abnormal. I would suggest to check a new cortisol test (cosyntropin stimulation test): she needs to come between 8 and 10 am, fasting, and plan to be here for 1 h. Would she agree with this? Labs are in.

## 2018-08-24 ENCOUNTER — Other Ambulatory Visit: Payer: Self-pay

## 2018-08-24 ENCOUNTER — Other Ambulatory Visit: Payer: Medicare HMO

## 2018-08-24 DIAGNOSIS — I10 Essential (primary) hypertension: Secondary | ICD-10-CM | POA: Diagnosis not present

## 2018-08-25 ENCOUNTER — Telehealth: Payer: Self-pay | Admitting: Family Medicine

## 2018-08-25 LAB — CMP14+EGFR
ALT: 17 IU/L (ref 0–32)
AST: 20 IU/L (ref 0–40)
Albumin/Globulin Ratio: 1.9 (ref 1.2–2.2)
Albumin: 4.3 g/dL (ref 3.8–4.8)
Alkaline Phosphatase: 143 IU/L — ABNORMAL HIGH (ref 39–117)
BUN/Creatinine Ratio: 22 (ref 12–28)
BUN: 25 mg/dL (ref 8–27)
Bilirubin Total: 0.3 mg/dL (ref 0.0–1.2)
CO2: 23 mmol/L (ref 20–29)
Calcium: 10.1 mg/dL (ref 8.7–10.3)
Chloride: 101 mmol/L (ref 96–106)
Creatinine, Ser: 1.13 mg/dL — ABNORMAL HIGH (ref 0.57–1.00)
GFR calc Af Amer: 59 mL/min/{1.73_m2} — ABNORMAL LOW (ref >59)
GFR calc non Af Amer: 51 mL/min/{1.73_m2} — ABNORMAL LOW (ref >59)
Globulin, Total: 2.3 g/dL (ref 1.5–4.5)
Glucose: 98 mg/dL (ref 65–99)
Potassium: 4.7 mmol/L (ref 3.5–5.2)
Sodium: 139 mmol/L (ref 134–144)
Total Protein: 6.6 g/dL (ref 6.0–8.5)

## 2018-08-25 NOTE — Telephone Encounter (Signed)
Notified patient of message from Dr. Cruzita Lederer, patient expressed understanding and agreement. No further questions.  9:30 AM lab appointment made for next Friday.

## 2018-08-28 NOTE — Telephone Encounter (Signed)
Patient aware and appt made 

## 2018-08-28 NOTE — Telephone Encounter (Signed)
-----   Message from Worthy Rancher, MD sent at 08/27/2018  8:35 PM EDT ----- She can restart it but just have her restart it at a half dose daily and will recheck her kidney function and next visit ----- Message ----- From: Dannial Monarch, LPN Sent: [id number]  11:40 AM EDT To: Fransisca Kaufmann Dettinger, MD  Can patient restart HCTZ?

## 2018-08-31 ENCOUNTER — Other Ambulatory Visit: Payer: Self-pay | Admitting: Family Medicine

## 2018-08-31 DIAGNOSIS — R5383 Other fatigue: Secondary | ICD-10-CM

## 2018-08-31 DIAGNOSIS — G2581 Restless legs syndrome: Secondary | ICD-10-CM

## 2018-09-01 ENCOUNTER — Other Ambulatory Visit: Payer: Self-pay

## 2018-09-01 ENCOUNTER — Ambulatory Visit (INDEPENDENT_AMBULATORY_CARE_PROVIDER_SITE_OTHER): Payer: Medicare HMO

## 2018-09-01 ENCOUNTER — Other Ambulatory Visit (INDEPENDENT_AMBULATORY_CARE_PROVIDER_SITE_OTHER): Payer: Medicare HMO

## 2018-09-01 ENCOUNTER — Telehealth: Payer: Self-pay | Admitting: Internal Medicine

## 2018-09-01 DIAGNOSIS — E278 Other specified disorders of adrenal gland: Secondary | ICD-10-CM

## 2018-09-01 LAB — CORTISOL
Cortisol, Plasma: 12.3 ug/dL
Cortisol, Plasma: 19.7 ug/dL
Cortisol, Plasma: 22.4 ug/dL

## 2018-09-01 MED ORDER — COSYNTROPIN 0.25 MG IJ SOLR: 0.2500 mg | Freq: Once | INTRAMUSCULAR | 0 refills | Status: DC

## 2018-09-01 MED ORDER — COSYNTROPIN 0.25 MG IJ SOLR
0.2500 mg | Freq: Once | INTRAMUSCULAR | Status: AC
  Administered 2018-09-01: 10:00:00 0.25 mg via INTRAMUSCULAR

## 2018-09-01 MED ORDER — COSYNTROPIN 0.25 MG IJ SOLR: 0.2500 mg | Freq: Once | INTRAMUSCULAR | Status: DC

## 2018-09-01 NOTE — Telephone Encounter (Signed)
Patient has called to advise that her pharmacy has called to advise that she has a "shot" called in for her and she is unaware of anything being called in for her    Please advise [phone]

## 2018-09-01 NOTE — Telephone Encounter (Signed)
I called and spoke with patient to clarify. I have called pharmacy to cancel. Patient received injection in office.

## 2018-09-04 LAB — ACTH: C206 ACTH: 36 pg/mL (ref 6–50)

## 2018-09-05 ENCOUNTER — Telehealth: Payer: Self-pay | Admitting: Internal Medicine

## 2018-09-05 NOTE — Telephone Encounter (Signed)
Patient would like to know if the results from her lab test have come back   She stated that the last couple of days she just feels like she is having to push her self to do anything, like she has no energy at all. Patient is not sure what is going on

## 2018-09-05 NOTE — Telephone Encounter (Signed)
Results were mailed to her:  I have called patient and let her know they were normal and I mailed them.  Patient wants to know why she is still so tired and fatigued all the time.

## 2018-09-05 NOTE — Telephone Encounter (Signed)
I am not sure about her energy... May need to see PCP for this.

## 2018-09-06 NOTE — Progress Notes (Signed)
Per orders of Dr. Cruzita Lederer injection of Cosyntropin given today by S.Roanna Reaves,CMA . Patient tolerated injection well, and was given injection in Left Deltoid.

## 2018-09-07 ENCOUNTER — Other Ambulatory Visit: Payer: Self-pay

## 2018-09-07 ENCOUNTER — Ambulatory Visit: Payer: Medicare HMO | Admitting: *Deleted

## 2018-09-07 ENCOUNTER — Ambulatory Visit (INDEPENDENT_AMBULATORY_CARE_PROVIDER_SITE_OTHER): Payer: Medicare HMO | Admitting: Family Medicine

## 2018-09-07 DIAGNOSIS — N3090 Cystitis, unspecified without hematuria: Secondary | ICD-10-CM

## 2018-09-07 DIAGNOSIS — I10 Essential (primary) hypertension: Secondary | ICD-10-CM

## 2018-09-07 DIAGNOSIS — E278 Other specified disorders of adrenal gland: Secondary | ICD-10-CM

## 2018-09-07 DIAGNOSIS — F411 Generalized anxiety disorder: Secondary | ICD-10-CM

## 2018-09-07 MED ORDER — AMOXICILLIN 500 MG PO CAPS: 500.0000 mg | ORAL_CAPSULE | Freq: Three times a day (TID) | ORAL | 0 refills | Status: DC

## 2018-09-07 NOTE — Patient Instructions (Signed)
Maria Moss was given information about Chronic Care Management services today including:  1. CCM service includes personalized support from designated clinical staff supervised by her physician, including individualized plan of care and coordination with other care providers 2. 24/7 contact phone numbers for assistance for urgent and routine care needs. 3. Service will only be billed when office clinical staff spend 20 minutes or more in a month to coordinate care. 4. Only one practitioner may furnish and bill the service in a calendar month. 5. The patient may stop CCM services at any time (effective at the end of the month) by phone call to the office staff. 6. The patient will be responsible for cost sharing (co-pay) of up to 20% of the service fee (after annual deductible is met).  Patient did not agree to services and wishes to consider information provided before deciding about enrollment in care management services.   Chong Sicilian BSN, RN-BC Embedded Chronic Care Manager [facility] Direct Dial: [phone]

## 2018-09-07 NOTE — Progress Notes (Signed)
Noah co-signed yesterday so it would close! I think it is good now!

## 2018-09-07 NOTE — Chronic Care Management (AMB) (Signed)
  Chronic Care Management   Initial Visit Note  09/07/2018 Name: Maria Moss MRN: [id number] DOB: October 30, 1950  I reached out to [name] today by telephone for her initial CCM visit. She did not have a documented initial contact with verbal consent for CCM services documented. Patient is interested in services but would like more information but did not feel like discussing it today. She was willing to accept a follow-up phone call in the next couple of weeks. [name] felt acutely ill today and complained of pain in her lower back, pelvis, and upper legs along with urinary frequency and feeling of incomplete voiding. She is scheduled for a visit with Dr Livia Snellen this afternoon.   Follow up plan: The care management team will reach out to the patient again over the next 14 days.    Chong Sicilian BSN, RN-BC Embedded Chronic Care Manager [facility] Direct Dial: [phone]

## 2018-09-08 ENCOUNTER — Encounter: Payer: Self-pay | Admitting: Family Medicine

## 2018-09-08 NOTE — Progress Notes (Signed)
Subjective:    Patient ID: Maria Moss, female    DOB: 1950-11-22, 68 y.o.   MRN: [id number]   HPI: Maria Moss is a 68 y.o. female presenting for burning with urination and frequency for several days. Denies fever . No flank pain. No nausea, vomiting.    Depression screen [facility] 2/9 01/27/2018 01/10/2018 10/21/2017 09/02/2017 08/18/2017  Decreased Interest 0 0 0 0 0  Down, Depressed, Hopeless 0 0 0 0 0  PHQ - 2 Score 0 0 0 0 0  Altered sleeping - - - - -  Tired, decreased energy - - - - -  Change in appetite - - - - -  Feeling bad or failure about yourself  - - - - -  Trouble concentrating - - - - -  Moving slowly or fidgety/restless - - - - -  Suicidal thoughts - - - - -  PHQ-9 Score - - - - -     Relevant past medical, surgical, family and social history reviewed and updated as indicated.  Interim medical history since our last visit reviewed. Allergies and medications reviewed and updated.  ROS:  Review of Systems  Constitutional: Negative for chills, diaphoresis and fever.  HENT: Negative for congestion.   Eyes: Negative for visual disturbance.  Respiratory: Negative for cough and shortness of breath.   Cardiovascular: Negative for chest pain and palpitations.  Gastrointestinal: Negative for constipation, diarrhea and nausea.  Genitourinary: Positive for dysuria, frequency and urgency. Negative for decreased urine volume, flank pain, hematuria, menstrual problem and pelvic pain.  Musculoskeletal: Negative for arthralgias and joint swelling.  Skin: Negative for rash.  Neurological: Negative for dizziness and numbness.     Social History   Tobacco Use  Smoking Status Never Smoker  Smokeless Tobacco Never Used       Objective:     Wt Readings from Last 3 Encounters:  07/21/18 149 lb (67.6 kg)  05/31/18 154 lb 6 oz (70 kg)  05/22/18 154 lb 6 oz (70 kg)     Exam deferred. Pt. Harboring due to COVID 19. Phone visit performed.   Assessment & Plan:   1. Cystitis      Meds ordered this encounter  Medications  . amoxicillin (AMOXIL) 500 MG capsule    Sig: Take 1 capsule (500 mg total) by mouth 3 (three) times daily.    Dispense:  30 capsule    Refill:  0    No orders of the defined types were placed in this encounter.     Diagnoses and all orders for this visit:  Cystitis  Other orders -     amoxicillin (AMOXIL) 500 MG capsule; Take 1 capsule (500 mg total) by mouth 3 (three) times daily.    Virtual Visit via telephone Note  I discussed the limitations, risks, security and privacy concerns of performing an evaluation and management service by telephone and the availability of in person appointments. The patient was identified with two identifiers. Pt.expressed understanding and agreed to proceed. Pt. Is at home. Dr. Livia Snellen is in his office.  Follow Up Instructions:   I discussed the assessment and treatment plan with the patient. The patient was provided an opportunity to ask questions and all were answered. The patient agreed with the plan and demonstrated an understanding of the instructions.   The patient was advised to call back or seek an in-person evaluation if the symptoms worsen or if the condition fails to improve as anticipated.  Total minutes including chart review and phone contact time: 11   Follow up plan: Return if symptoms worsen or fail to improve.  Maria Fraise, MD [place]

## 2018-09-26 ENCOUNTER — Other Ambulatory Visit: Payer: Self-pay | Admitting: Family

## 2018-09-26 ENCOUNTER — Ambulatory Visit (INDEPENDENT_AMBULATORY_CARE_PROVIDER_SITE_OTHER): Payer: Medicare HMO | Admitting: Family

## 2018-09-26 ENCOUNTER — Encounter: Payer: Self-pay | Admitting: Family

## 2018-09-26 DIAGNOSIS — R110 Nausea: Secondary | ICD-10-CM | POA: Diagnosis not present

## 2018-09-26 DIAGNOSIS — R51 Headache: Secondary | ICD-10-CM

## 2018-09-26 DIAGNOSIS — R519 Headache, unspecified: Secondary | ICD-10-CM

## 2018-09-26 DIAGNOSIS — M791 Myalgia, unspecified site: Secondary | ICD-10-CM

## 2018-09-26 DIAGNOSIS — Z87440 Personal history of urinary (tract) infections: Secondary | ICD-10-CM

## 2018-09-26 DIAGNOSIS — N390 Urinary tract infection, site not specified: Secondary | ICD-10-CM | POA: Diagnosis not present

## 2018-09-26 LAB — URINALYSIS, COMPLETE
Bilirubin, UA: NEGATIVE
Glucose, UA: NEGATIVE
Ketones, UA: NEGATIVE
Leukocytes,UA: NEGATIVE
Nitrite, UA: NEGATIVE
Protein,UA: NEGATIVE
RBC, UA: NEGATIVE
Specific Gravity, UA: 1.02 (ref 1.005–1.030)
Urobilinogen, Ur: 0.2 mg/dL (ref 0.2–1.0)
pH, UA: 7.5 (ref 5.0–7.5)

## 2018-09-26 LAB — MICROSCOPIC EXAMINATION: Renal Epithel, UA: NONE SEEN /hpf

## 2018-09-26 MED ORDER — ONDANSETRON HCL 4 MG PO TABS: 4.0000 mg | ORAL_TABLET | Freq: Three times a day (TID) | ORAL | 0 refills | Status: DC | PRN

## 2018-09-26 NOTE — Progress Notes (Signed)
Virtual Visit via telephone Note Due to COVID-19 pandemic this visit was conducted virtually. This visit type was conducted due to national recommendations for restrictions regarding the COVID-19 Pandemic (e.g. social distancing, sheltering in place) in an effort to limit this patient's exposure and mitigate transmission in our community. All issues noted in this document were discussed and addressed.  A physical exam was not performed with this format.  I connected with Maria Moss on 09/26/18 at 10:30 AM by telephone and verified that I am speaking with the correct person using two identifiers. Maria Fila Rosenberg is currently located at home and no one  is currently with her during visit. The provider, [name], FNP is located in their office at time of visit.  I discussed the limitations, risks, security and privacy concerns of performing an evaluation and management service by telephone and the availability of in person appointments. I also discussed with the patient that there may be a patient responsible charge related to this service. The patient expressed understanding and agreed to proceed.   History and Present Illness:  HPI Pt calls the office today with nausea that started over the weekend while she was driving in the care. She thought it was related to the car, because her symptoms improved after she got out of the care. However, then on Sunday her symptoms return and she was not riding. She reports since then she has had intermittent nausea without vomiting. She reports headache, decrease appetite, and body aches.   She states she had a UTI on 09/07/18. She was prescribed Amoxicillin which she completed. She denies any dysuria, but does have urinary frequency and urgency.   Review of Systems  Gastrointestinal: Positive for nausea.  Genitourinary: Positive for frequency and urgency. Negative for dysuria, flank pain and hematuria.  All other systems reviewed and are negative.     Observations/Objective: No SOB or distress noted   Assessment and Plan: 1. Nausea - Urinalysis, Complete - Urine Culture - ondansetron (ZOFRAN) 4 MG tablet; Take 1 tablet (4 mg total) by mouth every 8 (eight) hours as needed for nausea or vomiting.  Dispense: 20 tablet; Refill: 0  2. Myalgia - Urinalysis, Complete - Urine Culture  3. Nonintractable headache, unspecified chronicity pattern, unspecified headache type - Urinalysis, Complete - Urine Culture  4. Hx: UTI (urinary tract infection) - Urinalysis, Complete - Urine Culture  Pt will bring a urine sample to the office today to rule out UTI She will also go get COVD tested to rule out Unsure if this is related to an infection such as UTI or vertigo.  Zofran rx sent for her to take while in the car Call the office if symptoms worsen or do not improve     I discussed the assessment and treatment plan with the patient. The patient was provided an opportunity to ask questions and all were answered. The patient agreed with the plan and demonstrated an understanding of the instructions.   The patient was advised to call back or seek an in-person evaluation if the symptoms worsen or if the condition fails to improve as anticipated.  The above assessment and management plan was discussed with the patient. The patient verbalized understanding of and has agreed to the management plan. Patient is aware to call the clinic if symptoms persist or worsen. Patient is aware when to return to the clinic for a follow-up visit. Patient educated on when it is appropriate to go to the emergency department.  Time call ended:  10:50 AM  I provided 20 minutes of non-face-to-face time during this encounter.    [name], FNP

## 2018-09-27 LAB — URINE CULTURE

## 2018-09-29 DIAGNOSIS — Z20828 Contact with and (suspected) exposure to other viral communicable diseases: Secondary | ICD-10-CM | POA: Diagnosis not present

## 2018-10-12 ENCOUNTER — Other Ambulatory Visit: Payer: Self-pay

## 2018-10-13 ENCOUNTER — Ambulatory Visit (INDEPENDENT_AMBULATORY_CARE_PROVIDER_SITE_OTHER): Payer: Medicare HMO | Admitting: Family Medicine

## 2018-10-13 ENCOUNTER — Encounter: Payer: Self-pay | Admitting: Family Medicine

## 2018-10-13 VITALS — BP 128/76 | HR 81 | Temp 97.1°F | Ht 61.0 in | Wt 148.8 lb

## 2018-10-13 DIAGNOSIS — M545 Low back pain, unspecified: Secondary | ICD-10-CM

## 2018-10-13 DIAGNOSIS — Z23 Encounter for immunization: Secondary | ICD-10-CM | POA: Diagnosis not present

## 2018-10-13 DIAGNOSIS — G8929 Other chronic pain: Secondary | ICD-10-CM | POA: Diagnosis not present

## 2018-10-13 DIAGNOSIS — D638 Anemia in other chronic diseases classified elsewhere: Secondary | ICD-10-CM

## 2018-10-13 DIAGNOSIS — K219 Gastro-esophageal reflux disease without esophagitis: Secondary | ICD-10-CM | POA: Diagnosis not present

## 2018-10-13 DIAGNOSIS — Z0289 Encounter for other administrative examinations: Secondary | ICD-10-CM

## 2018-10-13 DIAGNOSIS — I10 Essential (primary) hypertension: Secondary | ICD-10-CM | POA: Diagnosis not present

## 2018-10-13 DIAGNOSIS — E782 Mixed hyperlipidemia: Secondary | ICD-10-CM

## 2018-10-13 MED ORDER — TRAMADOL HCL 50 MG PO TABS: 50.0000 mg | ORAL_TABLET | Freq: Three times a day (TID) | ORAL | 2 refills | Status: DC | PRN

## 2018-10-13 MED ORDER — HYOSCYAMINE SULFATE 0.125 MG PO TBDP: 0.1250 mg | ORAL_TABLET | Freq: Four times a day (QID) | ORAL | 4 refills | Status: DC | PRN

## 2018-10-13 MED ORDER — PANTOPRAZOLE SODIUM 40 MG PO TBEC: 40.0000 mg | DELAYED_RELEASE_TABLET | Freq: Every day | ORAL | 3 refills | Status: DC

## 2018-10-13 NOTE — Progress Notes (Signed)
BP 128/76   Pulse 81   Temp (!) 97.1 F (36.2 C) (Temporal)   Ht 5' 1" (1.549 m)   Wt 148 lb 12.8 oz (67.5 kg)   SpO2 100%   BMI 28.12 kg/m    Subjective:   Patient ID: Maria Moss, female    DOB: 11-06-1950, 67 y.o.   MRN: [id number]  HPI: Maria Moss is a 68 y.o. female presenting on 10/13/2018 for Hyperlipidemia (3 month follow up) and Hypertension   HPI Pain assessment: Cause of pain-chronic low back pain/degenerative disc disease and arthritis Pain location-bilateral lower back, she says her hip going down her leg is much improved than what it was previously Pain on scale of 1-10-4 Frequency-Daily although it has improved recently What increases pain-prolonged standing or prolonged walking What makes pain Better-tramadol and rest Effects on ADL -limits prolonged standing and prolonged walking but otherwise no major limitations Any change in general medical condition-none but does have anemia but is stabilized  Current opioids rx-tramadol 50 mg every 8 hours. # meds rx-90 Effectiveness of current meds-works well Adverse reactions form pain meds-she does get some constipation at times Morphine equivalent-15  Pill count performed-No Last drug screen -2017 ( high risk q47m moderate risk [id number] risk yearly ) Urine drug screen today- Yes Was the [place]yes  If yes were their any concerning findings? -None  No flowsheet data found.   Pain contract signed on: 10/13/2018  Hypertension Patient is currently on amlodipine and enalapril and hydrochlorothiazide, and their blood pressure today is 128/76. Patient denies any lightheadedness or dizziness. Patient denies headaches, blurred vision, chest pains, shortness of breath, or weakness. Denies any side effects from medication and is content with current medication.   Hyperlipidemia Patient is coming in for recheck of his hyperlipidemia. The patient is currently taking fish oil. They deny any issues with myalgias or  history of liver damage from it. They deny any focal numbness or weakness or chest pain.   Patient has anemia of chronic disease and the time for last she had elevated renal function as well, she does have a hematologist who is watching some of these but she says she is not seeing them anytime soon and would [place] Relevant past medical, surgical, family and social history reviewed and updated as indicated. Interim medical history since our last visit reviewed. Allergies and medications reviewed and updated.  Review of Systems  Constitutional: Negative for chills and fever.  Eyes: Negative for visual disturbance.  Respiratory: Negative for chest tightness and shortness of breath.   Cardiovascular: Negative for chest pain and leg swelling.  Musculoskeletal: Negative for back pain and gait problem.  Skin: Negative for rash.  Neurological: Negative for dizziness, light-headedness and headaches.  Psychiatric/Behavioral: Negative for agitation and behavioral problems.  All other systems reviewed and are negative.   Per HPI unless specifically indicated above   Allergies as of 10/13/2018      Reactions   Codeine Nausea And Vomiting   Sulfa Antibiotics    Hair loss, yellowed skin   Doxycycline Hyclate Itching      Medication List       Accurate as of October 13, 2018  1:53 PM. If you have any questions, ask your nurse or doctor.        acetaminophen 500 MG tablet Commonly known as: TYLENOL Take 500 mg by mouth every 8 (eight) hours as needed for headache.   amLODipine 5 MG  tablet Commonly known as: NORVASC TAKE 1 TABLET EVERY DAY   baclofen 10 MG tablet Commonly known as: LIORESAL TAKE 1 TABLET (10 MG) AT BEDTIME AS NEEDED FOR MUSCLE SPASMS   cetirizine 10 MG tablet Commonly known as: ZYRTEC Take 10 mg by mouth daily.   DULoxetine 60 MG capsule Commonly known as: CYMBALTA Take 1 capsule (60 mg total) by mouth daily.   enalapril 20 MG tablet  Commonly known as: VASOTEC TAKE 1 AND 1/2 TABLETS EVERY DAY   famotidine 20 MG tablet Commonly known as: PEPCID Take 20 mg by mouth daily.   Fish Oil 1000 MG Caps Take 2,000 mg by mouth daily.   fluticasone 50 MCG/ACT nasal spray Commonly known as: FLONASE USE 2 SPRAYS IN EACH NOSTRIL EVERY DAY   hydrochlorothiazide 25 MG tablet Commonly known as: HYDRODIURIL TAKE 1 TABLET EVERY DAY   hyoscyamine 0.125 MG Tbdp disintergrating tablet Commonly known as: ANASPAZ Take 1 tablet (0.125 mg total) by mouth every 6 (six) hours as needed. What changed: reasons to take this   meloxicam 15 MG tablet Commonly known as: MOBIC TAKE 1 TABLET EVERY DAY   OLANZapine 5 MG tablet Commonly known as: ZyPREXA Take 1 tablet (5 mg total) by mouth at bedtime.   ondansetron 4 MG tablet Commonly known as: Zofran Take 1 tablet (4 mg total) by mouth every 8 (eight) hours as needed for nausea or vomiting.   pantoprazole 40 MG tablet Commonly known as: PROTONIX Take 1 tablet (40 mg total) by mouth daily.   PROBIOTIC & ACIDOPHILUS EX ST PO Take 1 capsule by mouth at bedtime.   traMADol 50 MG tablet Commonly known as: ULTRAM Take 1 tablet (50 mg total) by mouth every 8 (eight) hours as needed (mild pain).   trolamine salicylate 10 % cream Commonly known as: ASPERCREME Apply 1 application topically 3 (three) times daily as needed for muscle pain.   Vitamin D-1000 Max St 25 MCG (1000 UT) tablet Generic drug: Cholecalciferol Take 1,000 Units by mouth daily.        Objective:   BP 128/76   Pulse 81   Temp (!) 97.1 F (36.2 C) (Temporal)   Ht 5' 1" (1.549 m)   Wt 148 lb 12.8 oz (67.5 kg)   SpO2 100%   BMI 28.12 kg/m   Wt Readings from Last 3 Encounters:  10/13/18 148 lb 12.8 oz (67.5 kg)  07/21/18 149 lb (67.6 kg)  05/31/18 154 lb 6 oz (70 kg)    Physical Exam Vitals signs and nursing note reviewed.  Constitutional:      General: She is not in acute distress.    Appearance:  She is well-developed. She is not diaphoretic.  Eyes:     Conjunctiva/sclera: Conjunctivae normal.  Cardiovascular:     Rate and Rhythm: Normal rate and regular rhythm.     Heart sounds: Normal heart sounds. No murmur.  Pulmonary:     Effort: Pulmonary effort is normal. No respiratory distress.     Breath sounds: Normal breath sounds. No wheezing.  Musculoskeletal: Normal range of motion.        General: Tenderness (Bilateral lumbar tenderness) present.  Skin:    General: Skin is warm and dry.     Findings: No rash.  Neurological:     Mental Status: She is alert and oriented to person, place, and time.     Coordination: Coordination normal.  Psychiatric:        Behavior: Behavior normal.  Assessment & Plan:   Problem List Items Addressed This Visit      Cardiovascular and Mediastinum   Hypertension (Chronic)   Relevant Orders   CMP14+EGFR     Other   Hyperlipemia (Chronic)   Chronic low back pain - Primary   Relevant Medications   traMADol (ULTRAM) 50 MG tablet   Other Relevant Orders   ToxASSURE Select 13 (MW), Urine   CMP14+EGFR   Pain management contract agreement   Anemia of chronic disease   Relevant Orders   CBC with Differential/Platelet    Other Visit Diagnoses    Gastroesophageal reflux disease without esophagitis       Relevant Medications   pantoprazole (PROTONIX) 40 MG tablet   hyoscyamine (ANASPAZ) 0.125 MG TBDP disintergrating tablet   Other Relevant Orders   Fecal occult blood, imunochemical      Continue tramadol, no changes in blood pressure medication, will closely monitor Follow up plan: Return in about 3 months (around 01/13/2019), or if symptoms worsen or fail to improve, for Pain and anxiety and hypertension and cholesterol.  Counseling provided for all of the vaccine components Orders Placed This Encounter  Procedures  . Fecal occult blood, imunochemical  . ToxASSURE Select 13 (MW), Urine  . CMP14+EGFR  . CBC with  Differential/Platelet    Caryl Pina, MD [facility] 10/13/2018, 1:53 PM

## 2018-10-13 NOTE — Addendum Note (Signed)
Addended by: Nigel Berthold C on: 10/13/2018 03:07 PM   Modules accepted: Orders

## 2018-10-14 LAB — CMP14+EGFR
ALT: 15 IU/L (ref 0–32)
AST: 19 IU/L (ref 0–40)
Albumin/Globulin Ratio: 1.8 (ref 1.2–2.2)
Albumin: 4.4 g/dL (ref 3.8–4.8)
Alkaline Phosphatase: 160 IU/L — ABNORMAL HIGH (ref 39–117)
BUN/Creatinine Ratio: 20 (ref 12–28)
BUN: 27 mg/dL (ref 8–27)
Bilirubin Total: 0.3 mg/dL (ref 0.0–1.2)
CO2: 23 mmol/L (ref 20–29)
Calcium: 10 mg/dL (ref 8.7–10.3)
Chloride: 98 mmol/L (ref 96–106)
Creatinine, Ser: 1.36 mg/dL — ABNORMAL HIGH (ref 0.57–1.00)
GFR calc Af Amer: 47 mL/min/{1.73_m2} — ABNORMAL LOW (ref >59)
GFR calc non Af Amer: 41 mL/min/{1.73_m2} — ABNORMAL LOW (ref >59)
Globulin, Total: 2.4 g/dL (ref 1.5–4.5)
Glucose: 94 mg/dL (ref 65–99)
Potassium: 4.7 mmol/L (ref 3.5–5.2)
Sodium: 135 mmol/L (ref 134–144)
Total Protein: 6.8 g/dL (ref 6.0–8.5)

## 2018-10-14 LAB — CBC WITH DIFFERENTIAL/PLATELET
Basophils Absolute: 0 10*3/uL (ref 0.0–0.2)
Basos: 0 %
EOS (ABSOLUTE): 0.5 10*3/uL — ABNORMAL HIGH (ref 0.0–0.4)
Eos: 6 %
Hematocrit: 29.9 % — ABNORMAL LOW (ref 34.0–46.6)
Hemoglobin: 9.6 g/dL — ABNORMAL LOW (ref 11.1–15.9)
Immature Grans (Abs): 0 10*3/uL (ref 0.0–0.1)
Immature Granulocytes: 0 %
Lymphocytes Absolute: 2.5 10*3/uL (ref 0.7–3.1)
Lymphs: 27 %
MCH: 27.1 pg (ref 26.6–33.0)
MCHC: 32.1 g/dL (ref 31.5–35.7)
MCV: 85 fL (ref 79–97)
Monocytes Absolute: 1.1 10*3/uL — ABNORMAL HIGH (ref 0.1–0.9)
Monocytes: 12 %
Neutrophils Absolute: 5.2 10*3/uL (ref 1.4–7.0)
Neutrophils: 55 %
Platelets: 529 10*3/uL — ABNORMAL HIGH (ref 150–450)
RBC: 3.54 x10E6/uL — ABNORMAL LOW (ref 3.77–5.28)
RDW: 13.6 % (ref 11.7–15.4)
WBC: 9.4 10*3/uL (ref 3.4–10.8)

## 2018-10-16 ENCOUNTER — Other Ambulatory Visit: Payer: Self-pay

## 2018-10-16 DIAGNOSIS — D638 Anemia in other chronic diseases classified elsewhere: Secondary | ICD-10-CM

## 2018-10-17 LAB — TOXASSURE SELECT 13 (MW), URINE

## 2018-10-24 ENCOUNTER — Other Ambulatory Visit: Payer: Medicare HMO

## 2018-10-24 DIAGNOSIS — K219 Gastro-esophageal reflux disease without esophagitis: Secondary | ICD-10-CM | POA: Diagnosis not present

## 2018-10-25 LAB — FECAL OCCULT BLOOD, IMMUNOCHEMICAL: Fecal Occult Bld: NEGATIVE

## 2018-10-27 ENCOUNTER — Ambulatory Visit (HOSPITAL_COMMUNITY): Payer: Medicare HMO | Admitting: Hematology

## 2018-11-02 ENCOUNTER — Ambulatory Visit (HOSPITAL_COMMUNITY): Payer: Medicare HMO | Admitting: Hematology

## 2018-11-13 ENCOUNTER — Other Ambulatory Visit: Payer: Self-pay | Admitting: Family Medicine

## 2018-11-15 ENCOUNTER — Ambulatory Visit (HOSPITAL_COMMUNITY): Payer: Medicare HMO | Admitting: Hematology

## 2018-11-23 ENCOUNTER — Inpatient Hospital Stay (HOSPITAL_COMMUNITY): Payer: Medicare HMO

## 2018-11-23 ENCOUNTER — Inpatient Hospital Stay (HOSPITAL_COMMUNITY): Payer: Medicare HMO | Attending: Hematology | Admitting: Hematology

## 2018-11-23 ENCOUNTER — Encounter (HOSPITAL_COMMUNITY): Payer: Self-pay | Admitting: Hematology

## 2018-11-23 ENCOUNTER — Other Ambulatory Visit: Payer: Self-pay

## 2018-11-23 VITALS — BP 138/68 | HR 75 | Temp 97.7°F | Resp 18 | Wt 149.9 lb

## 2018-11-23 DIAGNOSIS — D1800 Hemangioma unspecified site: Secondary | ICD-10-CM | POA: Insufficient documentation

## 2018-11-23 DIAGNOSIS — Z79899 Other long term (current) drug therapy: Secondary | ICD-10-CM | POA: Diagnosis not present

## 2018-11-23 DIAGNOSIS — E278 Other specified disorders of adrenal gland: Secondary | ICD-10-CM

## 2018-11-23 DIAGNOSIS — D473 Essential (hemorrhagic) thrombocythemia: Secondary | ICD-10-CM

## 2018-11-23 DIAGNOSIS — D649 Anemia, unspecified: Secondary | ICD-10-CM | POA: Insufficient documentation

## 2018-11-23 DIAGNOSIS — D75839 Thrombocytosis, unspecified: Secondary | ICD-10-CM | POA: Insufficient documentation

## 2018-11-23 LAB — IRON AND TIBC
Iron: 56 ug/dL (ref 28–170)
Saturation Ratios: 11 % (ref 10.4–31.8)
TIBC: 490 ug/dL — ABNORMAL HIGH (ref 250–450)
UIBC: 434 ug/dL

## 2018-11-23 LAB — RETICULOCYTES
Immature Retic Fract: 7.3 % (ref 2.3–15.9)
RBC.: 3.86 MIL/uL — ABNORMAL LOW (ref 3.87–5.11)
Retic Count, Absolute: 45.9 10*3/uL (ref 19.0–186.0)
Retic Ct Pct: 1.2 % (ref 0.4–3.1)

## 2018-11-23 LAB — FERRITIN: Ferritin: 11 ng/mL (ref 11–307)

## 2018-11-23 LAB — FOLATE: Folate: 17.2 ng/mL (ref >5.9)

## 2018-11-23 LAB — VITAMIN B12: Vitamin B-12: 462 pg/mL (ref 180–914)

## 2018-11-23 LAB — LACTATE DEHYDROGENASE: LDH: 146 U/L (ref 98–192)

## 2018-11-23 NOTE — Progress Notes (Signed)
[place], [place] [address]   CLINIC:  Medical Oncology/Hematology  PCP:  Dettinger, Fransisca Kaufmann, MD [place] [id number] [phone]   REASON FOR VISIT: Normocytic anemia and thrombocytosis.  CURRENT THERAPY:  Work-up   INTERVAL HISTORY:  Maria Moss 68 y.o. female seen for follow-up of normocytic anemia and thrombocytosis.  She was initially seen by me for right adrenal mass.  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  She also had history of mild hypercalcemia.  She denies any bleeding per rectum or melena.  She has mild constipation which is stable.  Leg swellings are also stable.  Appetite is 50%.  Energy levels are 25%.    REVIEW OF SYSTEMS:  Review of Systems  Cardiovascular: Positive for leg swelling.  Gastrointestinal: Positive for constipation.  Psychiatric/Behavioral: Positive for sleep disturbance.  All other systems reviewed and are negative.    PAST MEDICAL/SURGICAL HISTORY:  Past Medical History:  Diagnosis Date  . Ankle fracture, left 2006  . Diverticula, intestine    Mild case.  . Fibromyalgia    Dr. Justine Null Dx in [place] years ago  . GERD (gastroesophageal reflux disease)   . History of hiatal hernia   . Hyperlipidemia   . Hypertension    Dx age 37.    Past Surgical History:  Procedure Laterality Date  . ANKLE SURGERY Left    Broken.  Has screws and metal plate.  . CHOLECYSTECTOMY    . ROBOTIC ADRENALECTOMY Right 05/31/2018   Procedure: XI ROBOTIC RIGHT ADRENALECTOMY;  Surgeon: Ralene Ok, MD;  Location: [facility];  Service: General;  Laterality: Right;     SOCIAL HISTORY:  Social History   Socioeconomic History  . Marital status: Married    Spouse name: Not on file  . Number of children: Not on file  . Years of education: Not on file  . Highest education level: Not on file  Occupational History  . Not on file  Social Needs  . Financial resource  strain: Not on file  . Food insecurity    Worry: Not on file    Inability: Not on file  . Transportation needs    Medical: Not on file    Non-medical: Not on file  Tobacco Use  . Smoking status: Never Smoker  . Smokeless tobacco: Never Used  Substance and Sexual Activity  . Alcohol use: No  . Drug use: No  . Sexual activity: Not on file  Lifestyle  . Physical activity    Days per week: Not on file    Minutes per session: Not on file  . Stress: Not on file  Relationships  . Social Herbalist on phone: Not on file    Gets together: Not on file    Attends religious service: Not on file    Active member of club or organization: Not on file    Attends meetings of clubs or organizations: Not on file    Relationship status: Not on file  . Intimate partner violence    Fear of current or ex partner: Not on file    Emotionally abused: Not on file    Physically abused: Not on file    Forced sexual activity: Not on file  Other Topics Concern  . Not on file  Social History Narrative  . Not on file    FAMILY HISTORY:  Family History  Problem Relation Age of Onset  .  Heart disease Mother        CHF  . Cancer Mother        Uterine  . Osteoporosis Mother   . COPD Sister   . Asthma Sister   . Migraines Sister     CURRENT MEDICATIONS:  Outpatient Encounter Medications as of 11/23/2018  Medication Sig  . amLODipine (NORVASC) 5 MG tablet TAKE 1 TABLET EVERY DAY  . cetirizine (ZYRTEC) 10 MG tablet Take 10 mg by mouth daily.  . Cholecalciferol (VITAMIN D-1000 MAX ST) 1000 units tablet Take 1,000 Units by mouth daily.   . DULoxetine (CYMBALTA) 60 MG capsule Take 1 capsule (60 mg total) by mouth daily.  . enalapril (VASOTEC) 20 MG tablet TAKE 1 AND 1/2 TABLETS EVERY DAY  . famotidine (PEPCID) 20 MG tablet Take 20 mg by mouth daily.  . fluticasone (FLONASE) 50 MCG/ACT nasal spray USE 2 SPRAYS IN EACH NOSTRIL EVERY DAY  . hydrochlorothiazide (HYDRODIURIL) 25 MG tablet TAKE  1 TABLET EVERY DAY  . loratadine (CLARITIN) 10 MG tablet Take 10 mg by mouth daily.  . meloxicam (MOBIC) 15 MG tablet TAKE 1 TABLET EVERY DAY  . Omega-3 Fatty Acids (FISH OIL) 1000 MG CAPS Take 2,000 mg by mouth daily.   . pantoprazole (PROTONIX) 40 MG tablet Take 1 tablet (40 mg total) by mouth daily.  . Probiotic Product (PROBIOTIC & ACIDOPHILUS EX ST PO) Take 1 capsule by mouth at bedtime.   . trolamine salicylate (ASPERCREME) 10 % cream Apply 1 application topically 3 (three) times daily as needed for muscle pain.  Marland Kitchen acetaminophen (TYLENOL) 500 MG tablet Take 500 mg by mouth every 8 (eight) hours as needed for headache.  . ALPRAZolam (XANAX) 0.25 MG tablet Take 0.25 mg by mouth as needed.   . hyoscyamine (ANASPAZ) 0.125 MG TBDP disintergrating tablet Take 1 tablet (0.125 mg total) by mouth every 6 (six) hours as needed. (Patient not taking: Reported on 11/23/2018)  . ondansetron (ZOFRAN) 4 MG tablet Take 1 tablet (4 mg total) by mouth every 8 (eight) hours as needed for nausea or vomiting. (Patient not taking: Reported on 11/23/2018)  . traMADol (ULTRAM) 50 MG tablet Take 1 tablet (50 mg total) by mouth every 8 (eight) hours as needed (mild pain). (Patient not taking: Reported on 11/23/2018)  . [DISCONTINUED] baclofen (LIORESAL) 10 MG tablet TAKE 1 TABLET (10 MG) AT BEDTIME AS NEEDED FOR MUSCLE SPASMS  . [DISCONTINUED] cosyntropin (CORTROSYN) 0.25 MG injection   . [DISCONTINUED] OLANZapine (ZYPREXA) 5 MG tablet Take 1 tablet (5 mg total) by mouth at bedtime.   No facility-administered encounter medications on file as of 11/23/2018.     ALLERGIES:  Allergies  Allergen Reactions  . Codeine Nausea And Vomiting  . Sulfa Antibiotics     Hair loss, yellowed skin  . Doxycycline Hyclate Itching     PHYSICAL EXAM:  ECOG Performance status: 1  Vitals:   11/23/18 1112  BP: 138/68  Pulse: 75  Resp: 18  Temp: 97.7 F (36.5 C)  SpO2: 100%   Filed Weights   11/23/18 1112  Weight: 149  lb 14.4 oz (68 kg)    Physical Exam Constitutional:      Appearance: Normal appearance. She is normal weight.  Musculoskeletal: Normal range of motion.  Skin:    General: Skin is warm and dry.  Neurological:     Mental Status: She is alert and oriented to person, place, and time. Mental status is at baseline.  Psychiatric:  Mood and Affect: Mood normal.        Behavior: Behavior normal.        Thought Content: Thought content normal.        Judgment: Judgment normal.      LABORATORY DATA:  I have reviewed the labs as listed.  CBC    Component Value Date/Time   WBC 9.4 10/13/2018 1427   WBC 10.8 (H) 06/01/2018 0415   RBC 3.86 (L) 11/23/2018 1150   RBC 3.19 (L) 06/01/2018 0415   HGB 9.6 (L) 10/13/2018 1427   HCT 29.9 (L) 10/13/2018 1427   PLT 529 (H) 10/13/2018 1427   MCV 85 10/13/2018 1427   MCH 27.1 10/13/2018 1427   MCH 27.3 06/01/2018 0415   MCHC 32.1 10/13/2018 1427   MCHC 30.6 06/01/2018 0415   RDW 13.6 10/13/2018 1427   LYMPHSABS 2.5 10/13/2018 1427   EOSABS 0.5 (H) 10/13/2018 1427   BASOSABS 0.0 10/13/2018 1427   CMP Latest Ref Rng & Units 10/13/2018 08/24/2018 08/17/2018  Glucose 65 - 99 mg/dL 94 98 103(H)  BUN 8 - 27 mg/dL 27 25 30(H)  Creatinine 0.57 - 1.00 mg/dL 1.36(H) 1.13(H) 1.59(H)  Sodium 134 - 144 mmol/L 135 139 140  Potassium 3.5 - 5.2 mmol/L 4.7 4.7 4.8  Chloride 96 - 106 mmol/L 98 101 103  CO2 20 - 29 mmol/L 23 23 21   Calcium 8.7 - 10.3 mg/dL 10.0 10.1 10.3  Total Protein 6.0 - 8.5 g/dL 6.8 6.6 6.3  Total Bilirubin 0.0 - 1.2 mg/dL 0.3 0.3 0.5  Alkaline Phos 39 - 117 IU/L 160(H) 143(H) 141(H)  AST 0 - 40 IU/L 19 20 23   ALT 0 - 32 IU/L 15 17 17        DIAGNOSTIC IMAGING:  I have independently reviewed the scans and discussed with the patient.    ASSESSMENT & PLAN:   Thrombocytosis ([place]) 1.  Thrombocytosis: -She had thrombocytosis for last several years. -This was initially thought to be secondary to adrenal mass. -I have  recommended further work-up with a JAK2 V617F and reflex testing to rule out myeloproliferative disorders.  2.  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  3.  Mild hypercalcemia: -She has mild hypercalcemia with PTH of 41. -This is being worked up by her endocrinologist.  4.  Normocytic anemia: -Likely from iron deficiency state.  We will check for various nutritional deficiencies.      Orders placed this encounter:  Orders Placed This Encounter  Procedures  . Iron and TIBC  . Ferritin  . Vitamin B12  . Folate  . Methylmalonic acid, serum  . Protein electrophoresis, serum  . Copper, serum  . Reticulocytes  . Lactate dehydrogenase  . JAK2 V617F, w Reflex to CALR/E12/MPL      Derek Jack, MD [place] [phone]

## 2018-11-23 NOTE — Patient Instructions (Addendum)
[place] at [facility] Discharge Instructions  You were seen today by Dr. Delton Coombes. He went over your recent lab results. He will have labs drawn today. He will see you back in 3 weeks for labs and follow up.   Thank you for choosing [place] at [facility] to provide your oncology and hematology care.  To afford each patient quality time with our provider, please arrive at least 15 minutes before your scheduled appointment time.   If you have a lab appointment with the [facility] please come in thru the  Main Entrance and check in at the main information desk  You need to re-schedule your appointment should you arrive 10 or more minutes late.  We strive to give you quality time with our providers, and arriving late affects you and other patients whose appointments are after yours.  Also, if you no show three or more times for appointments you may be dismissed from the clinic at the providers discretion.     Again, thank you for choosing [facility].  Our hope is that these requests will decrease the amount of time that you wait before being seen by our physicians.       _____________________________________________________________  Should you have questions after your visit to [facility], please contact our office at [phone] between the hours of 8:00 a.m. and 4:30 p.m.  Voicemails left after 4:00 p.m. will not be returned until the following business day.  For prescription refill requests, have your pharmacy contact our office and allow 72 hours.    Cancer Center Support Programs:   > Cancer Support Group  2nd Tuesday of the month 1pm-2pm, Journey Room

## 2018-11-23 NOTE — Assessment & Plan Note (Signed)
1.  Thrombocytosis: -She had thrombocytosis for last several years. -This was initially thought to be secondary to adrenal mass. -I have recommended further work-up with a JAK2 V617F and reflex testing to rule out myeloproliferative disorders.  2.  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  3.  Mild hypercalcemia: -She has mild hypercalcemia with PTH of 41. -This is being worked up by her endocrinologist.  4.  Normocytic anemia: -Likely from iron deficiency state.  We will check for various nutritional deficiencies.

## 2018-11-25 LAB — COPPER, SERUM: Copper: 152 ug/dL (ref 72–166)

## 2018-11-27 LAB — PROTEIN ELECTROPHORESIS, SERUM
A/G Ratio: 1.3 (ref 0.7–1.7)
Albumin ELP: 4 g/dL (ref 2.9–4.4)
Alpha-1-Globulin: 0.2 g/dL (ref 0.0–0.4)
Alpha-2-Globulin: 0.8 g/dL (ref 0.4–1.0)
Beta Globulin: 1.3 g/dL (ref 0.7–1.3)
Gamma Globulin: 0.7 g/dL (ref 0.4–1.8)
Globulin, Total: 3 g/dL (ref 2.2–3.9)
Total Protein ELP: 7 g/dL (ref 6.0–8.5)

## 2018-11-29 LAB — METHYLMALONIC ACID, SERUM: Methylmalonic Acid, Quantitative: 393 nmol/L — ABNORMAL HIGH (ref 0–378)

## 2018-12-07 LAB — JAK2 V617F, W REFLEX TO CALR/E12/MPL

## 2018-12-07 LAB — CALR + JAK2 E12-15 + MPL (REFLEXED)

## 2018-12-18 ENCOUNTER — Inpatient Hospital Stay (HOSPITAL_COMMUNITY): Payer: Medicare HMO | Attending: Hematology | Admitting: Hematology

## 2018-12-18 ENCOUNTER — Other Ambulatory Visit: Payer: Self-pay

## 2018-12-18 ENCOUNTER — Encounter (HOSPITAL_COMMUNITY): Payer: Self-pay | Admitting: Hematology

## 2018-12-18 DIAGNOSIS — D473 Essential (hemorrhagic) thrombocythemia: Secondary | ICD-10-CM | POA: Insufficient documentation

## 2018-12-18 DIAGNOSIS — D631 Anemia in chronic kidney disease: Secondary | ICD-10-CM

## 2018-12-18 DIAGNOSIS — E538 Deficiency of other specified B group vitamins: Secondary | ICD-10-CM | POA: Diagnosis not present

## 2018-12-18 DIAGNOSIS — D509 Iron deficiency anemia, unspecified: Secondary | ICD-10-CM | POA: Diagnosis not present

## 2018-12-18 DIAGNOSIS — D75839 Thrombocytosis, unspecified: Secondary | ICD-10-CM

## 2018-12-18 DIAGNOSIS — N189 Chronic kidney disease, unspecified: Secondary | ICD-10-CM | POA: Diagnosis not present

## 2018-12-18 DIAGNOSIS — Z79899 Other long term (current) drug therapy: Secondary | ICD-10-CM | POA: Insufficient documentation

## 2018-12-18 NOTE — Progress Notes (Signed)
Virtual Visit via Telephone Note  I connected with Maria Moss on 12/18/18 at  8:00 AM EST by telephone and verified that I am speaking with the correct person using two identifiers.   I discussed the limitations, risks, security and privacy concerns of performing an evaluation and management service by telephone and the availability of in person appointments. I also discussed with the patient that there may be a patient responsible charge related to this service. The patient expressed understanding and agreed to proceed.   History of Present Illness: She is being seen in our clinic for adrenal mass as well as thrombocytosis.  She had adrenal mass resected on 05/31/2018 which was benign cavernous hemangioma.  She also has mild hypercalcemia with PTH of 41.   Observations/Objective: She reports having tiredness a lot.  Appetite is 75%.  Energy levels are 25%.  Reports pain in the back and legs which is chronic.  Denies any bleeding per rectum or melena.  She has constipation alternating with diarrhea.  Reports some sleep  problems.  Assessment and Plan:  1.  Normocytic anemia: -This is combination anemia from iron deficiency and CKD. -Hemoglobin is 9.6, MCV 85, ferritin of 11. -I have recommended 2 infusions of weekly Feraheme.  We discussed about side effects including rare chance of anaphylactic reaction. -We will schedule her for Feraheme.  We will see her back in 2 months with labs.  2.  Thrombocytosis: -Myeloproliferative disorder testing including JAK2 and extended testing were negative. -Likely reactive thrombocytosis from iron deficiency.  3.  B12 deficiency: -Even though B12 was normal at 462, methylmalonic acid was elevated at 393. -I have recommended her to take B12 1 mg tablet daily.   Follow Up Instructions: RTC 2 months with labs 1 week prior   I discussed the assessment and treatment plan with the patient. The patient was provided an opportunity to ask questions and all  were answered. The patient agreed with the plan and demonstrated an understanding of the instructions.   The patient was advised to call back or seek an in-person evaluation if the symptoms worsen or if the condition fails to improve as anticipated.  I provided 13 minutes of non-face-to-face time during this encounter.   Derek Jack, MD

## 2018-12-26 ENCOUNTER — Inpatient Hospital Stay (HOSPITAL_COMMUNITY): Payer: Medicare HMO

## 2018-12-26 ENCOUNTER — Other Ambulatory Visit: Payer: Self-pay

## 2018-12-26 VITALS — BP 138/80 | HR 73 | Temp 96.7°F | Resp 18

## 2018-12-26 DIAGNOSIS — D638 Anemia in other chronic diseases classified elsewhere: Secondary | ICD-10-CM

## 2018-12-26 DIAGNOSIS — N189 Chronic kidney disease, unspecified: Secondary | ICD-10-CM | POA: Diagnosis not present

## 2018-12-26 DIAGNOSIS — D473 Essential (hemorrhagic) thrombocythemia: Secondary | ICD-10-CM | POA: Diagnosis not present

## 2018-12-26 DIAGNOSIS — D509 Iron deficiency anemia, unspecified: Secondary | ICD-10-CM | POA: Diagnosis not present

## 2018-12-26 DIAGNOSIS — Z79899 Other long term (current) drug therapy: Secondary | ICD-10-CM | POA: Diagnosis not present

## 2018-12-26 DIAGNOSIS — E538 Deficiency of other specified B group vitamins: Secondary | ICD-10-CM | POA: Diagnosis not present

## 2018-12-26 MED ORDER — SODIUM CHLORIDE 0.9% FLUSH
10.0000 mL | Freq: Once | INTRAVENOUS | Status: AC | PRN
  Administered 2018-12-26: 10 mL

## 2018-12-26 MED ORDER — SODIUM CHLORIDE 0.9 % IV SOLN: Freq: Once | INTRAVENOUS | Status: AC

## 2018-12-26 MED ORDER — SODIUM CHLORIDE 0.9 % IV SOLN
510.0000 mg | Freq: Once | INTRAVENOUS | Status: AC
  Administered 2018-12-26: 510 mg via INTRAVENOUS
  Filled 2018-12-26: qty 510

## 2018-12-26 NOTE — Patient Instructions (Signed)

## 2018-12-26 NOTE — Progress Notes (Signed)
To treatment room for iron infusion.  Written information given for review and to carry home.  All questions asked and answered.  No s/s of distress noted.   Patient tolerated iron infusion with no complaints voiced.  Peripheral IV site clean and dry with good blood return noted before and after infusion.  Band aid applied.  VSS with discharge and left ambulatory with no s/s of distress noted.

## 2019-01-02 ENCOUNTER — Inpatient Hospital Stay (HOSPITAL_COMMUNITY): Payer: Medicare HMO

## 2019-01-02 ENCOUNTER — Other Ambulatory Visit: Payer: Self-pay

## 2019-01-02 ENCOUNTER — Encounter (HOSPITAL_COMMUNITY): Payer: Self-pay

## 2019-01-02 VITALS — BP 130/65 | HR 81 | Temp 96.8°F | Resp 18

## 2019-01-02 DIAGNOSIS — D638 Anemia in other chronic diseases classified elsewhere: Secondary | ICD-10-CM

## 2019-01-02 DIAGNOSIS — D509 Iron deficiency anemia, unspecified: Secondary | ICD-10-CM | POA: Diagnosis not present

## 2019-01-02 DIAGNOSIS — N189 Chronic kidney disease, unspecified: Secondary | ICD-10-CM | POA: Diagnosis not present

## 2019-01-02 DIAGNOSIS — E538 Deficiency of other specified B group vitamins: Secondary | ICD-10-CM | POA: Diagnosis not present

## 2019-01-02 DIAGNOSIS — Z79899 Other long term (current) drug therapy: Secondary | ICD-10-CM | POA: Diagnosis not present

## 2019-01-02 DIAGNOSIS — D473 Essential (hemorrhagic) thrombocythemia: Secondary | ICD-10-CM | POA: Diagnosis not present

## 2019-01-02 MED ORDER — SODIUM CHLORIDE 0.9 % IV SOLN
510.0000 mg | Freq: Once | INTRAVENOUS | Status: AC
  Administered 2019-01-02: 510 mg via INTRAVENOUS
  Filled 2019-01-02: qty 510

## 2019-01-02 MED ORDER — SODIUM CHLORIDE 0.9 % IV SOLN: Freq: Once | INTRAVENOUS | Status: AC

## 2019-01-02 NOTE — Patient Instructions (Signed)
[facility] at [facility] Discharge Instructions  Received Feraheme infusion today. Follow-up as scheduled. Call clinic for any questions or concerns   Thank you for choosing [facility] at [facility] to provide your oncology and hematology care.  To afford each patient quality time with our provider, please arrive at least 15 minutes before your scheduled appointment time.   If you have a lab appointment with the Cancer Center please come in thru the Main Entrance and check in at the main information desk.  You need to re-schedule your appointment should you arrive 10 or more minutes late.  We strive to give you quality time with our providers, and arriving late affects you and other patients whose appointments are after yours.  Also, if you no show three or more times for appointments you may be dismissed from the clinic at the providers discretion.     Again, thank you for choosing [facility].  Our hope is that these requests will decrease the amount of time that you wait before being seen by our physicians.       _____________________________________________________________  Should you have questions after your visit to [facility], please contact our office at [phone] between the hours of 8:00 a.m. and 4:30 p.m.  Voicemails left after 4:00 p.m. will not be returned until the following business day.  For prescription refill requests, have your pharmacy contact our office and allow 72 hours.    Due to Covid, you will need to wear a mask upon entering the hospital. If you do not have a mask, a mask will be given to you at the Main Entrance upon arrival. For doctor visits, patients may have 1 support person with them. For treatment visits, patients can not have anyone with them due to social distancing guidelines and our immunocompromised population.     

## 2019-01-02 NOTE — Progress Notes (Signed)
Maria Moss tolerated Feraheme infusion well without complaints or incident. Peripheral IV site checked with positive blood return noted prior to and after infusion. VSS upon discharge. Pt discharged self ambulatory in satisfactory condition

## 2019-01-12 ENCOUNTER — Other Ambulatory Visit: Payer: Self-pay

## 2019-01-15 ENCOUNTER — Ambulatory Visit (INDEPENDENT_AMBULATORY_CARE_PROVIDER_SITE_OTHER): Payer: Medicare HMO | Admitting: Family Medicine

## 2019-01-15 ENCOUNTER — Encounter: Payer: Self-pay | Admitting: Family Medicine

## 2019-01-15 ENCOUNTER — Other Ambulatory Visit: Payer: Self-pay

## 2019-01-15 VITALS — BP 137/80 | HR 91 | Temp 98.6°F | Ht 61.0 in | Wt 149.4 lb

## 2019-01-15 DIAGNOSIS — G8929 Other chronic pain: Secondary | ICD-10-CM | POA: Diagnosis not present

## 2019-01-15 DIAGNOSIS — F411 Generalized anxiety disorder: Secondary | ICD-10-CM | POA: Diagnosis not present

## 2019-01-15 DIAGNOSIS — I10 Essential (primary) hypertension: Secondary | ICD-10-CM | POA: Diagnosis not present

## 2019-01-15 DIAGNOSIS — M545 Low back pain: Secondary | ICD-10-CM | POA: Diagnosis not present

## 2019-01-15 DIAGNOSIS — E782 Mixed hyperlipidemia: Secondary | ICD-10-CM | POA: Diagnosis not present

## 2019-01-15 MED ORDER — TRAMADOL HCL 50 MG PO TABS: 50.0000 mg | ORAL_TABLET | Freq: Three times a day (TID) | ORAL | 2 refills | Status: DC | PRN

## 2019-01-15 MED ORDER — DULOXETINE HCL 60 MG PO CPEP: 60.0000 mg | ORAL_CAPSULE | Freq: Every day | ORAL | 1 refills | Status: DC

## 2019-01-15 MED ORDER — MELOXICAM 15 MG PO TABS: 15.0000 mg | ORAL_TABLET | Freq: Every day | ORAL | 1 refills | Status: DC

## 2019-01-15 MED ORDER — TRAZODONE HCL 50 MG PO TABS: 25.0000 mg | ORAL_TABLET | Freq: Every evening | ORAL | 1 refills | Status: DC | PRN

## 2019-01-15 NOTE — Progress Notes (Signed)
BP 137/80   Pulse 91   Temp 98.6 F (37 C) (Temporal)   Ht 5\' 1"  (1.549 m)   Wt 149 lb 6.4 oz (67.8 kg)   SpO2 97%   BMI 28.23 kg/m    Subjective:   Patient ID: Maria Moss, female    DOB: June 17, 1950, 69 y.o.   MRN: [id number]  HPI: Maria Moss is a 69 y.o. female presenting on 01/15/2019 for Hypertension (3 month follow up ) and Hyperlipidemia   HPI Pain assessment: Cause of pain-chronic lumbar pain now going into left hip degenerative spine Pain location-lower back and left hip Pain on scale of 1-10- 7 Frequency-almost daily What increases pain-movement and being up and around on her feet What makes pain Better-tramadol and rest Effects on ADL -she is limited on her ability ability to go up and down stairs and get up and down and move quickly because of pain in her spine Any change in general medical condition-none  Current opioids rx-tramadol 50 mg every 8 hours as needed # meds rx-90 Effectiveness of current meds-works well, does not always use 3/day Adverse reactions form pain meds-none Morphine equivalent-15  Pill count performed-No Last drug screen -10/18/2018 ( high risk q18m, moderate risk q15m, low risk yearly ) Urine drug screen today- No Was the [facility] reviewed-yes  If yes were their any concerning findings? -None  No flowsheet data found.   Pain contract signed on: 10/18/2018  Hypertension Patient is currently on enalapril and hydrochlorothiazide and amlodipine, and their blood pressure today is 137/80. Patient denies any lightheadedness or dizziness. Patient denies headaches, blurred vision, chest pains, shortness of breath, or weakness. Denies any side effects from medication and is content with current medication.   Hyperlipidemia Patient is coming in for recheck of his hyperlipidemia. The patient is currently taking no medication currently we are monitoring and diet control. They deny any issues with myalgias or history of liver damage from it. They deny  any focal numbness or weakness or chest pain.   Anxiety depression recheck Patient feels like she has been doing good with anxiety and depression, she says the only issue that she still has a sometimes she does not sleep at night.  She is takes Xanax but with the tramadol will try and avoid that and she had tried Seroquel in the past.  Patient denies any suicidal ideations or thoughts of hurting self.  Relevant past medical, surgical, family and social history reviewed and updated as indicated. Interim medical history since our last visit reviewed. Allergies and medications reviewed and updated.  Review of Systems  Constitutional: Negative for chills and fever.  Eyes: Negative for visual disturbance.  Respiratory: Negative for chest tightness and shortness of breath.   Cardiovascular: Negative for chest pain and leg swelling.  Genitourinary: Negative for difficulty urinating and dysuria.  Musculoskeletal: Positive for arthralgias, back pain and gait problem.  Skin: Negative for rash.  Neurological: Negative for dizziness, light-headedness and headaches.  Psychiatric/Behavioral: Negative for agitation and behavioral problems.  All other systems reviewed and are negative.   Per HPI unless specifically indicated above   Allergies as of 01/15/2019      Reactions   Codeine Nausea And Vomiting   Sulfa Antibiotics    Hair loss, yellowed skin   Doxycycline Hyclate Itching      Medication List       Accurate as of January 15, 2019  1:24 PM. If you have any questions, ask your nurse or doctor.  STOP taking these medications   ALPRAZolam 0.25 MG tablet Commonly known as: XANAX Stopped by: Elige Radon Aubrionna Istre, MD     TAKE these medications   acetaminophen 500 MG tablet Commonly known as: TYLENOL Take 500 mg by mouth every 8 (eight) hours as needed for headache.   amLODipine 5 MG tablet Commonly known as: NORVASC TAKE 1 TABLET EVERY DAY   cetirizine 10 MG tablet Commonly  known as: ZYRTEC Take 10 mg by mouth daily.   Claritin 10 MG tablet Generic drug: loratadine Take 10 mg by mouth daily.   DULoxetine 60 MG capsule Commonly known as: CYMBALTA Take 1 capsule (60 mg total) by mouth daily.   enalapril 20 MG tablet Commonly known as: VASOTEC TAKE 1 AND 1/2 TABLETS EVERY DAY   famotidine 20 MG tablet Commonly known as: PEPCID Take 20 mg by mouth daily.   Fish Oil 1000 MG Caps Take 2,000 mg by mouth daily.   fluticasone 50 MCG/ACT nasal spray Commonly known as: FLONASE USE 2 SPRAYS IN EACH NOSTRIL EVERY DAY   hydrochlorothiazide 25 MG tablet Commonly known as: HYDRODIURIL TAKE 1 TABLET EVERY DAY   hyoscyamine 0.125 MG Tbdp disintergrating tablet Commonly known as: ANASPAZ Take 1 tablet (0.125 mg total) by mouth every 6 (six) hours as needed.   meloxicam 15 MG tablet Commonly known as: MOBIC Take 1 tablet (15 mg total) by mouth daily.   ondansetron 4 MG tablet Commonly known as: Zofran Take 1 tablet (4 mg total) by mouth every 8 (eight) hours as needed for nausea or vomiting.   pantoprazole 40 MG tablet Commonly known as: PROTONIX Take 1 tablet (40 mg total) by mouth daily.   PROBIOTIC & ACIDOPHILUS EX ST PO Take 1 capsule by mouth at bedtime.   traMADol 50 MG tablet Commonly known as: ULTRAM Take 1 tablet (50 mg total) by mouth every 8 (eight) hours as needed (mild pain).   traZODone 50 MG tablet Commonly known as: DESYREL Take 0.5-1 tablets (25-50 mg total) by mouth at bedtime as needed for sleep. Started by: Elige Radon Lamine Laton, MD   trolamine salicylate 10 % cream Commonly known as: ASPERCREME Apply 1 application topically 3 (three) times daily as needed for muscle pain.   vitamin B-12 1000 MCG tablet Commonly known as: CYANOCOBALAMIN Take 1,000 mcg by mouth daily.   Vitamin D-1000 Max St 25 MCG (1000 UT) tablet Generic drug: Cholecalciferol Take 1,000 Units by mouth daily.        Objective:   BP 137/80   Pulse  91   Temp 98.6 F (37 C) (Temporal)   Ht 5\' 1"  (1.549 m)   Wt 149 lb 6.4 oz (67.8 kg)   SpO2 97%   BMI 28.23 kg/m   Wt Readings from Last 3 Encounters:  01/15/19 149 lb 6.4 oz (67.8 kg)  11/23/18 149 lb 14.4 oz (68 kg)  10/13/18 148 lb 12.8 oz (67.5 kg)    Physical Exam Vitals and nursing note reviewed.  Constitutional:      General: She is not in acute distress.    Appearance: She is well-developed. She is not diaphoretic.  Eyes:     Conjunctiva/sclera: Conjunctivae normal.  Cardiovascular:     Rate and Rhythm: Normal rate and regular rhythm.     Heart sounds: Normal heart sounds. No murmur.  Pulmonary:     Effort: Pulmonary effort is normal. No respiratory distress.     Breath sounds: Normal breath sounds. No wheezing.  Skin:  General: Skin is warm and dry.     Findings: No rash.  Neurological:     Mental Status: She is alert and oriented to person, place, and time.     Coordination: Coordination normal.  Psychiatric:        Behavior: Behavior normal.       Assessment & Plan:   Problem List Items Addressed This Visit      Cardiovascular and Mediastinum   Hypertension - Primary (Chronic)     Other   Hyperlipemia (Chronic)   GAD (generalized anxiety disorder) (Chronic)   Relevant Medications   DULoxetine (CYMBALTA) 60 MG capsule   traZODone (DESYREL) 50 MG tablet   Chronic low back pain   Relevant Medications   DULoxetine (CYMBALTA) 60 MG capsule   meloxicam (MOBIC) 15 MG tablet   traMADol (ULTRAM) 50 MG tablet      Will continue tramadol and Cymbalta and meloxicam, will add trazodone for sleep to see if it helps her. Follow up plan: Return in about 3 months (around 04/15/2019), or if symptoms worsen or fail to improve, for Hypertension and pain and cholesterol.  Counseling provided for all of the vaccine components No orders of the defined types were placed in this encounter.   Arville Care, MD [facility] 01/15/2019,  1:24 PM

## 2019-01-24 ENCOUNTER — Telehealth: Payer: Self-pay | Admitting: Family Medicine

## 2019-01-24 NOTE — Telephone Encounter (Signed)
Advised pt the Trazodone was sent to [facility] for a 90 day supply and pt voiced understanding.

## 2019-01-26 ENCOUNTER — Ambulatory Visit (INDEPENDENT_AMBULATORY_CARE_PROVIDER_SITE_OTHER): Payer: Medicare HMO | Admitting: Internal Medicine

## 2019-01-26 ENCOUNTER — Encounter: Payer: Self-pay | Admitting: Internal Medicine

## 2019-01-26 ENCOUNTER — Other Ambulatory Visit: Payer: Self-pay

## 2019-01-26 DIAGNOSIS — E213 Hyperparathyroidism, unspecified: Secondary | ICD-10-CM

## 2019-01-26 DIAGNOSIS — E559 Vitamin D deficiency, unspecified: Secondary | ICD-10-CM | POA: Diagnosis not present

## 2019-01-26 DIAGNOSIS — R5382 Chronic fatigue, unspecified: Secondary | ICD-10-CM | POA: Diagnosis not present

## 2019-01-26 DIAGNOSIS — E278 Other specified disorders of adrenal gland: Secondary | ICD-10-CM

## 2019-01-26 DIAGNOSIS — E041 Nontoxic single thyroid nodule: Secondary | ICD-10-CM

## 2019-01-26 NOTE — Patient Instructions (Addendum)
Please return to the lab at 8 am, fasting.  If the results are normal, please return to see me as needed.

## 2019-01-26 NOTE — Progress Notes (Addendum)
Patient ID: Maria Moss, female   DOB: Aug 30, 1950, 69 y.o.   MRN: [id number]   Patient location: Home My location: Office Persons participating in the virtual visit: patient, provider  I connected with the patient on 01/26/19 at 12:57 PM EST by telephone and verified that I am speaking with the correct person.   I discussed the limitations of evaluation and management by telephone and the availability of in person appointments. The patient expressed understanding and agreed to proceed.   Details of the encounter are shown below.  HPI  KHADIJA Moss is a 69 y.o.-year-old female, referred by Dr. 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  Last visit 6 months ago.  She has fatigue since last OV >> had an iron infusion >> feeling better after the infusion, but now fatigued again.  I reviewed patient's previous imaging test reports: Lumbar MRI (10/28/2017): 5.2 x 4.4 R renal or adrenal heterogeneous soft tissue mass  CT abdomen w/ and w/o contrast (11/04/2017): R adrenal 5 cm complex necrotic appearing and enhancing mass with nodular areas of contrast enhancement and scattered areas of calcification >> possible RCC vs. metastasis  PET CT (11/18/2017):  CHEST: No hypermetabolic mediastinal or hilar nodes. No suspicious pulmonary nodules on the CT scan. ABDOMEN/PELVIS: Large RIGHT renal mass again noted measuring 5.0 by 4.5 cm. Mass has central photopenia and mild peripheral metabolic activity with SUV max equal 3.1 (along the medial border of the mass for example.) This mild activity is similar to liver activity (SUV max equal 3.8). The LEFT adrenal glands normal. No other sites of abnormal FDG uptake.  IMPRESSION: 1. Large RIGHT adrenal mass with central photopenia and mild peripheral metabolic activity. Findings are nonspecific. Recommend surgical consultation for lesions of this size. 2. No evidence of primary or metastatic carcinoma outside of  the adrenal gland.  Of note, she did have a normal testosterone, estradiol, 24-hour urine cortisol but she had a slightly low ACTH. Component     Latest Ref Rng & Units 11/10/2017 11/13/2017  Urine Total Volume-UCRE24     mL  2,175  Collection Interval-UCRE24     hours  24  Creatinine, Urine     mg/dL  43.15  Creatinine, 24H Ur     600 - 1,800 mg/day  939  Cortisol (Ur), Free     6 - 42 ug/24 hr  9  Total Volume       2,175  Cortisol,F,ug/L,U     Undefined ug/L  4  Cortisol, Plasma     ug/dL 4.9   Testosterone     3 - 41 ng/dL <3 (L)   Estradiol     pg/mL <5.0   C206 ACTH     7.2 - 63.3 pg/mL 6.5 (L)    No history of hypokalemia.  Aldosterone was not elevated: Component     Latest Ref Rng & Units 01/31/2018  ALDOSTERONE      ng/dL <1  Renin Activity     0.25 - 5.82 ng/mL/h 9.86 (H)  ALDO / PRA Ratio      see note   Cortisol was appropriately suppressed after dexamethasone, with no evidence of Cushing syndrome: Component     Latest Ref Rng & Units 02/03/2018  Dexamethasone, Serum     ng/dL >1,000  Cortisol - AM     mcg/dL 2.2 (L)   Plasma normetanephrines were elevated, which is nonspecific: Component     Latest Ref Rng & Units  01/31/2018  Epinephrine     pg/mL 34  Norepinephrine     pg/mL 1,073  Dopamine     pg/mL 58  Catecholamines, Total     pg/mL 1,107  Metanephrine, Pl     <=57 pg/mL <25  Normetanephrine, Pl     <=148 pg/mL 232 (H)  Total Metanephrines-Plasma     <=205 pg/mL 232 (H)  Potassium     3.5 - 5.1 mEq/L 4.3   24-hour urine collection (more specific test) was checked and this was normal: Component     Latest Ref Rng & Units 02/16/2018 02/16/2018        11:51 AM 11:51 AM  Volume, Urine-VMAUR     mL 2,200 2,200  Epinephrine, 24 hr Urine      see note   Norepinephrine, 24 hr Ur     15 - 100 mcg/24 h 8 (L)   Calculated Total (E+NE)     26 - 121 mcg/24 h 8 (L)   Dopamine, 24 hr Urine      see note   Creatinine, Urine mg/day-CATEUR      0.50 - 2.15 g/24 h 0.77   Metanephrines, Ur     90 - 315 mcg/24 h  97  Normetanephr.,U,24h     122 - 676 mcg/24 h  407  Metanephrine, Ur     224 - 832 mcg/24 h  504  Creatinine, 24H Ur     0.50 - 2.15 g/24 h 0.79   We ruled out pheochromocytoma - see above.  She had right adrenalectomy by Dr. 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  She feels well after surgery, without complaints.  At last visit, we checked a cosyntropin stimulation test and this was normal, ruling out adrenal urgency after surgery: Component     Latest Ref Rng & Units 07/28/2018  Cortisol, Plasma     ug/dL 15.4  VITD     30.00 - 100.00 ng/mL 62.95  ACTH pending.  Cortisol and vitamin D levels normal.  Addendum: ACTH slightly high with a normal cortisol. Will need to have her back for another cortisol and ACTH level and also a stim test.  Component     Latest Ref Rng & Units 09/01/2018 09/01/2018 09/01/2018         9:45 AM 10:30 AM 10:58 AM  C206 ACTH     6 - 50 pg/mL 36    Cortisol, Plasma     ug/dL 12.3 19.7 22.4   She has a history of vitamin D deficiency. Lab Results  Component Value Date   VD25OH 62.95 07/28/2018   VD25OH 29.1 (L) 04/14/2017   VD25OH 43.9 11/02/2013  We started vitamin D 3000 units daily.  She continues on this now.  She has a history of slightly elevated calcium level with inappropriately suppressed PTH: Lab Results  Component Value Date   PTH 41 11/10/2017   PTH Comment 11/10/2017   Lab Results  Component Value Date   CALCIUM 10.0 10/13/2018   CALCIUM 10.1 08/24/2018   CALCIUM 10.3 08/17/2018   CALCIUM 8.6 (L) 06/01/2018   CALCIUM 9.9 05/22/2018   CALCIUM 10.3 01/27/2018   CALCIUM 10.5 (H) 11/10/2017   CALCIUM 10.4 (H) 10/21/2017   CALCIUM 10.5 (H) 06/02/2017   CALCIUM 10.4 (H) 04/14/2017   CALCIUM 10.4 (H) 12/16/2016   CALCIUM 10.5 (H) 09/24/2016   CALCIUM 10.6 (H) 09/10/2016   CALCIUM 10.2 06/11/2016   CALCIUM 10.3 03/05/2016   CALCIUM  9.8 11/28/2015  CALCIUM 10.3 08/29/2015   CALCIUM 10.1 03/21/2015   CALCIUM 10.4 (H) 11/29/2014   CALCIUM 9.9 05/17/2014   CALCIUM 10.3 02/15/2014   CALCIUM 10.3 07/19/2013   CALCIUM 10.0 03/19/2013   CALCIUM 10.6 (H) 11/22/2012   CALCIUM 10.7 (H) 07/21/2012   She was previously on Tums but we stopped these.  Calcium normalized afterwards and even had 1 low calcium in 05/2018.  She has a history of osteopenia per review of her DXA scan report from 2015 and 2018.  DXA scan ([facility]) from 10/24/2016: L1-L4: -0.5 RFN: -1.0 LFN: -1.2  No history of kidney stones.  She c/o fatigue. A recent MMA was high: Component     Latest Ref Rng & Units 11/23/2018  Methylmalonic Acid, Quantitative     0 - 378 nmol/L 393 (H)  Vitamin B12     180 - 914 pg/mL 462  Folate     >5.9 ng/mL 17.2  She was started on B12 vitamin 1000 mcg daily.  ROS: Constitutional: no weight gain/no weight loss, + fatigue, + subjective hyperthermia, no subjective hypothermia Eyes: no blurry vision, no xerophthalmia ENT: no sore throat, no nodules palpated in neck, no dysphagia, no odynophagia, no hoarseness Cardiovascular: no CP/no SOB/no palpitations/no leg swelling Respiratory: no cough/no SOB/no wheezing Gastrointestinal: no N/no V/no D/no C/no acid reflux Musculoskeletal: + muscle aches/+ joint aches Skin: no rashes, no hair loss Neurological: no tremors/no numbness/no tingling/no dizziness  I reviewed pt's medications, allergies, PMH, social hx, family hx, and changes were documented in the history of present illness. Otherwise, unchanged from my initial visit note.  Past Medical History:  Diagnosis Date  . Ankle fracture, left 2006  . Diverticula, intestine    Mild case.  . Fibromyalgia    Dr. Justine Null Dx in [place] years ago  . GERD (gastroesophageal reflux disease)   . History of hiatal hernia   . Hyperlipidemia   . Hypertension    Dx age 77.     Past Surgical History:  Procedure  Laterality Date  . ANKLE SURGERY Left    Broken.  Has screws and metal plate.  . CHOLECYSTECTOMY    . ROBOTIC ADRENALECTOMY Right 05/31/2018   Procedure: XI ROBOTIC RIGHT ADRENALECTOMY;  Surgeon: Ralene Ok, MD;  Location: [facility];  Service: General;  Laterality: Right;    Social History   Socioeconomic History  . Marital status: Married    Spouse name: Not on file  . Number of children: Not on file  . Years of education: Not on file  . Highest education level: Not on file  Occupational History  . Not on file  Tobacco Use  . Smoking status: Never Smoker  . Smokeless tobacco: Never Used  Substance and Sexual Activity  . Alcohol use: No  . Drug use: No  . Sexual activity: Not on file  Other Topics Concern  . Not on file  Social History Narrative  . Not on file   Social Determinants of Health   Financial Resource Strain:   . Difficulty of Paying Living Expenses: Not on file  Food Insecurity:   . Worried About Charity fundraiser in the Last Year: Not on file  . Ran Out of Food in the Last Year: Not on file  Transportation Needs:   . Lack of Transportation (Medical): Not on file  . Lack of Transportation (Non-Medical): Not on file  Physical Activity:   . Days of Exercise per Week: Not on file  . Minutes of Exercise  per Session: Not on file  Stress:   . Feeling of Stress : Not on file  Social Connections:   . Frequency of Communication with Friends and Family: Not on file  . Frequency of Social Gatherings with Friends and Family: Not on file  . Attends Religious Services: Not on file  . Active Member of Clubs or Organizations: Not on file  . Attends Archivist Meetings: Not on file  . Marital Status: Not on file  Intimate Partner Violence:   . Fear of Current or Ex-Partner: Not on file  . Emotionally Abused: Not on file  . Physically Abused: Not on file  . Sexually Abused: Not on file    Current Outpatient Medications on File Prior to Visit   Medication Sig Dispense Refill  . acetaminophen (TYLENOL) 500 MG tablet Take 500 mg by mouth every 8 (eight) hours as needed for headache.    Marland Kitchen amLODipine (NORVASC) 5 MG tablet TAKE 1 TABLET EVERY DAY 90 tablet 3  . cetirizine (ZYRTEC) 10 MG tablet Take 10 mg by mouth daily.    . Cholecalciferol (VITAMIN D-1000 MAX ST) 1000 units tablet Take 1,000 Units by mouth daily.     . DULoxetine (CYMBALTA) 60 MG capsule Take 1 capsule (60 mg total) by mouth daily. 90 capsule 1  . enalapril (VASOTEC) 20 MG tablet TAKE 1 AND 1/2 TABLETS EVERY DAY 135 tablet 3  . famotidine (PEPCID) 20 MG tablet Take 20 mg by mouth daily.    . fluticasone (FLONASE) 50 MCG/ACT nasal spray USE 2 SPRAYS IN EACH NOSTRIL EVERY DAY 48 g 5  . hydrochlorothiazide (HYDRODIURIL) 25 MG tablet TAKE 1 TABLET EVERY DAY 90 tablet 3  . hyoscyamine (ANASPAZ) 0.125 MG TBDP disintergrating tablet Take 1 tablet (0.125 mg total) by mouth every 6 (six) hours as needed. 30 tablet 4  . loratadine (CLARITIN) 10 MG tablet Take 10 mg by mouth daily.    . meloxicam (MOBIC) 15 MG tablet Take 1 tablet (15 mg total) by mouth daily. 90 tablet 1  . Omega-3 Fatty Acids (FISH OIL) 1000 MG CAPS Take 2,000 mg by mouth daily.     . ondansetron (ZOFRAN) 4 MG tablet Take 1 tablet (4 mg total) by mouth every 8 (eight) hours as needed for nausea or vomiting. 20 tablet 0  . pantoprazole (PROTONIX) 40 MG tablet Take 1 tablet (40 mg total) by mouth daily. 90 tablet 3  . Probiotic Product (PROBIOTIC & ACIDOPHILUS EX ST PO) Take 1 capsule by mouth at bedtime.     . traMADol (ULTRAM) 50 MG tablet Take 1 tablet (50 mg total) by mouth every 8 (eight) hours as needed (mild pain). 90 tablet 2  . traZODone (DESYREL) 50 MG tablet Take 0.5-1 tablets (25-50 mg total) by mouth at bedtime as needed for sleep. 90 tablet 1  . trolamine salicylate (ASPERCREME) 10 % cream Apply 1 application topically 3 (three) times daily as needed for muscle pain.    . vitamin B-12 (CYANOCOBALAMIN)  1000 MCG tablet Take 1,000 mcg by mouth daily.     No current facility-administered medications on file prior to visit.    Allergies  Allergen Reactions  . Codeine Nausea And Vomiting  . Sulfa Antibiotics     Hair loss, yellowed skin  . Doxycycline Hyclate Itching    Family History  Problem Relation Age of Onset  . Heart disease Mother        CHF  . Cancer Mother  Uterine  . Osteoporosis Mother   . COPD Sister   . Asthma Sister   . Migraines Sister     PE: There were no vitals taken for this visit. Wt Readings from Last 3 Encounters:  01/15/19 149 lb 6.4 oz (67.8 kg)  11/23/18 149 lb 14.4 oz (68 kg)  10/13/18 148 lb 12.8 oz (67.5 kg)   Constitutional:  in NAD  The physical exam was not performed (telephone visit).  ASSESSMENT: 1. Adrenal incidentaloma  2.  Hypercalcemia  3.  Vitamin D deficiency  PLAN:  1. Patient with a history of a right adrenal nodule discovered incidentally.  This had necrotic areas and scattered areas of calcifications, and measured 5 x 4.5 cm.  The PET CT scan did not show increased FDG uptake to suggest malignancy.  We ruled out adrenal hormone hyper secretion and I cleared her for surgery.  She had surgery 2 months before our last visit and the pathology was benign (cavernous hemangioma).  After surgery, her right leg pain has resolved. -At last visit, we checked a cosyntropin stimulation test and this was normal, ruling out adrenal insufficiency. -No further investigation is necessary for this, but I will have her back for an am cortisol and ACTH as she c/o fatigue. She also has nausea, low AP, HAs. These are not new, but maybe worse now.  2.  Hypercalcemia -She had mild hypercalcemia in the past, however, this was when she was taking a calcium supplement (Tums) which I advised her to stop.  We also restarted her vitamin D which she stopped before seeing me.  Of note, she had a normal PTH level, however, this appears to be  inappropriately normal in the setting of a high calcium. -She has a history of osteopenia, but not osteoporosis per review of her DXA scan report from 2018.  She actually had another DXA scan in 2019 but I do not have these Moss.  No history of kidney stones.   - she is on HCTZ 12.5 mg daily, dose decreased by PCP from 25 mg daily.  We discussed that this can be elevate her calcium levels. -Latest calcium level was normal but she did not have any PTH level checked recently.  I will have her return for this.  If the results are normal, I will have her return to see me as needed.  3.  Vitamin D deficiency -Her vitamin D level was slightly low in 2019 so we restarted vitamin D at 3000 units daily. -Latest vitamin D was reviewed and this was normal 07/2018. -She continues on this dose now  - time spent with the patient: 13 min, of which >50% was spent in obtaining information about her symptoms, reviewing her previous labs, evaluations, and treatments, counseling her about her condition (please see the discussed topics above), and developing a plan to further investigate and treat them; she had a number of questions which I addressed.  Orders Placed This Encounter  Procedures  . VITAMIN D 25 Hydroxy (Vit-D Deficiency, Fractures)  . PTH, intact and calcium  . Cortisol  . ACTH   Component     Latest Ref Rng & Units 02/02/2019          Calcium     8.7 - 10.3 mg/dL 10.5 (H)  PTH, Intact     15 - 65 pg/mL 54  PTH Interp      Comment  Cortisol, Plasma     ug/dL 13.3  C206 ACTH  6 - 50 pg/mL 35  VITD     30.00 - 100.00 ng/mL 73.31  No adrenal insufficiency. Calcium minimally elevated now with nonsuppressed PTH.  It is possible that the HCTZ is contributing to this.  Vitamin D level is normal. I would suggest to change HCTZ to 20 mg of Lasix daily and have her back for repeat labs in 2-3 weeks.  At that time, I will also give her a urine drug for 24-hour urine collection.  Component      Latest Ref Rng & Units 03/05/2019  Glucose     65 - 99 mg/dL 86  BUN     8 - 27 mg/dL 36 (H)  Creatinine     0.57 - 1.00 mg/dL 1.65 (H)  GFR, Est Non African American     >59 mL/min/1.73 32 (L)  GFR, Est African American     >59 mL/min/1.73 37 (L)  BUN/Creatinine Ratio     12 - 28 22  Sodium     134 - 144 mmol/L 138  Potassium     3.5 - 5.2 mmol/L 5.7 (H)  Chloride     96 - 106 mmol/L 99  CO2     20 - 29 mmol/L 25  Calcium     8.7 - 10.3 mg/dL 11.0 (H)  Vitamin D 1, 25 (OH) Total     pg/mL 47  Vitamin D2 1, 25 (OH)     pg/mL <10  Vitamin D3 1, 25 (OH)     pg/mL 47  PTH, Intact     15 - 65 pg/mL 46  Creatinine, Urine     Not Estab. mg/dL 36.8  Creatinine, 24H Ur     800 - 1,800 mg/24 hr 736 (L)  Calcium, Urine     Not Estab. mg/dL 3.7  Calcium, 24H Urine     47 - 462 mg/24 hr 74  Phosphorus     3.0 - 4.3 mg/dL 3.6  Magnesium     1.6 - 2.3 mg/dL 2.2   High potassium, calcium and decreased GFR. 24-hour urine calcium is not high. This was not a very accurate collection, based on the creatinine level, but the calcium is in the lower range of normal.   She may need evaluation by nephrology.  I have not completely sure whether her hyperparathyroidism/hypercalcemia is not secondary to her CKD.  I will forward these results to her PCP.  We will have her hold enalapril for now until she can get in touch with PCP. I will also go ahead and order a technetium sestamibi scan.  Addendum (05/03/2019): Her technetium sestamibi scan was positive for parathyroid adenoma: Narrative & Impression    CLINICAL DATA:  Hyperparathyroidism, elevated calcium = 10.6 and parathormone = 46  EXAM: NM PARATHYROID SCINTIGRAPHY AND SPECT IMAGING  TECHNIQUE: Following intravenous administration of radiopharmaceutical, early and 2-hour delayed planar images were obtained in the anterior projection. Delayed triplanar SPECT images were also obtained at 2 hours.  RADIOPHARMACEUTICALS:  25.8  mCi Tc-7m Sestamibi IV  COMPARISON:  None  FINDINGS: Planar imaging: Asymmetric tracer distribution between the thyroid lobes greater on RIGHT than LEFT on initial image. Focal area of decreased tracer uptake at the upper pole RIGHT lobe. Delayed images demonstrate washout of tracer from thyroid tissue with focal abnormal sestamibi retention at the inferior RIGHT parathyroid gland region. Question additional focus of increased sestamibi retention at the upper pole at RIGHT lobe, above the cold focus.  SPECT imaging: No abnormal sestamibi retention  at the LEFT thyroid lobe. Focal abnormal sestamibi retention at the expected position of the RIGHT inferior parathyroid gland consistent with parathyroid adenoma. An additional area of focal tracer uptake at the superior pole of the RIGHT thyroid lobe, cannot exclude coexistent RIGHT superior parathyroid adenoma.  IMPRESSION: RIGHT inferior parathyroid adenoma.  Cannot exclude coexistent parathyroid adenoma at the RIGHT superior parathyroid gland.  Potential cold nodule within the RIGHT thyroid lobe near upper pole; consider follow-up thyroid ultrasound assessment to further evaluate.   Electronically Signed   By: Lavonia Dana M.D.   On: 05/03/2019 13:57   We will discuss with patient whether she agrees with a referral to surgery, but this should be done only after investigation of her right thyroid nodule.  I will order a thyroid ultrasound for her. Last TSH was normal: Lab Results  Component Value Date   TSH 0.914 04/14/2017    Addendum: Thyroid U/S (05/11/2019): Parenchymal Echotexture: Moderately heterogenous Isthmus: 4 mm Right lobe: 6.1 x 2.2 x 3.1 cm Left lobe: 5.1 x 1.2 x 1.6 cm _______________________________________________________  Estimated total number of nodules >/= 1 cm: 1 _____________________________________________  Nodule # 1: Location: Right; Mid Maximum size: 2.2 cm; Other 2 dimensions:  2.0 x 1.8 cm Composition: spongiform (0) Echogenicity: isoechoic (1) This nodule does NOT meet TI-RADS criteria for biopsy or dedicated follow-up. _________________________________________________________  Moderate thyroid heterogeneity with mixed echogenicity and background pseudo nodularity. No hypervascularity. No regional adenopathy.  IMPRESSION: 2.2 cm right mid thyroid TR 1 spongiform nodule correlates with the cold nodule by the parathyroid scan. This would not meet criteria for any biopsy or follow-up.  Nonspecific thyroid heterogeneity.  The above is in keeping with the ACR TI-RADS recommendations - J Am Coll Radiol 2017;14:587-595.  Thyroid nodule is large, But not worrisome.  No further investigation needed. We will go ahead with a referral to surgery.  Philemon Kingdom, MD PhD [facility]

## 2019-02-02 ENCOUNTER — Other Ambulatory Visit (INDEPENDENT_AMBULATORY_CARE_PROVIDER_SITE_OTHER): Payer: Medicare HMO

## 2019-02-02 ENCOUNTER — Other Ambulatory Visit: Payer: Self-pay

## 2019-02-02 DIAGNOSIS — E559 Vitamin D deficiency, unspecified: Secondary | ICD-10-CM | POA: Diagnosis not present

## 2019-02-02 DIAGNOSIS — R5382 Chronic fatigue, unspecified: Secondary | ICD-10-CM | POA: Diagnosis not present

## 2019-02-02 DIAGNOSIS — E278 Other specified disorders of adrenal gland: Secondary | ICD-10-CM | POA: Diagnosis not present

## 2019-02-02 LAB — VITAMIN D 25 HYDROXY (VIT D DEFICIENCY, FRACTURES): VITD: 73.31 ng/mL (ref 30.00–100.00)

## 2019-02-02 LAB — CORTISOL: Cortisol, Plasma: 13.3 ug/dL

## 2019-02-03 LAB — PTH, INTACT AND CALCIUM
Calcium: 10.5 mg/dL — ABNORMAL HIGH (ref 8.7–10.3)
PTH: 54 pg/mL (ref 15–65)

## 2019-02-05 LAB — ACTH: C206 ACTH: 35 pg/mL (ref 6–50)

## 2019-02-06 MED ORDER — FUROSEMIDE 20 MG PO TABS: 20.0000 mg | ORAL_TABLET | Freq: Every day | ORAL | 3 refills | Status: DC

## 2019-02-06 NOTE — Addendum Note (Signed)
Addended by: Philemon Kingdom on: 02/06/2019 05:20 PM   Modules accepted: Orders

## 2019-02-07 ENCOUNTER — Telehealth: Payer: Self-pay

## 2019-02-07 NOTE — Telephone Encounter (Signed)
-----   Message from Philemon Kingdom, MD sent at 02/06/2019  5:22 PM EST ----- Lenna Sciara, can you please call pt:  Her labs are back: No signs of adrenal insufficiency, however, her calcium is again high.  At this point, we need to investigate her for this.  She is on HCTZ, which can raise her calcium, so we need to stop this and switch to Lasix 20 mg daily, which should not raise her calcium.  I did not send this to the pharmacy until you talk to her.  It is on her medication list, though, so please send it if she agrees, and take off HCTZ.  After she switches to this, please have her back for labs in 2 to 3 weeks.  At that time, she will also be given a urine jug for a 24-hour urine collection.  I am inserting instructions for the urine collection below:  Patient information (Up-to-Date): Collection of a 24-hour urine specimen   - You should collect every drop of urine during each 24-hour period. It does not matter how much or little urine is passed each time, as long as every drop is collected. - Begin the urine collection in the morning after you wake up, after you have emptied your bladder for the first time. - Urinate (empty the bladder) for the first time and flush it down the toilet. Note the exact time (eg, 6:15 AM). You will begin the urine collection at this time. - Collect every drop of urine during the day and night in an empty collection bottle. Store the bottle at room temperature or in the refrigerator. - If you need to have a bowel movement, any urine passed with the bowel movement should be collected. Try not to include feces with the urine collection. If feces does get mixed in, do not try to remove the feces from the urine collection bottle. - Finish by collecting the first urine passed the next morning, adding it to the collection bottle. This should be within ten minutes before or after the time of the first morning void on the first day (which was flushed). In this example, you would try  to void between 6:05 and 6:25 on the second day. - If you need to urinate one hour before the final collection time, drink a full glass of water so that you can void again at the appropriate time. If you have to urinate 20 minutes before, try to hold the urine until the proper time. - Please note the exact time of the final collection, even if it is not the same time as when collection began on day 1. - The bottle(s) may be kept at room temperature for a day or two, but should be kept cool or refrigerated for longer periods of time.  Thank you, C

## 2019-02-08 NOTE — Telephone Encounter (Signed)
Patient calling to speak to [facility] requested a call back as soon as possible to discuss results

## 2019-02-08 NOTE — Telephone Encounter (Signed)
Left message for patient to return our call at [phone].  

## 2019-02-09 MED ORDER — FUROSEMIDE 20 MG PO TABS: 20.0000 mg | ORAL_TABLET | Freq: Every day | ORAL | 3 refills | Status: DC

## 2019-02-09 NOTE — Telephone Encounter (Signed)
Notified patient of message from Dr. Cruzita Lederer, patient expressed understanding and agreement. No further questions.  Patient ok with the medication change, I will send to her pharmacy.  Patient scheduled for follow up labs.

## 2019-02-12 ENCOUNTER — Inpatient Hospital Stay (HOSPITAL_COMMUNITY): Payer: Medicare HMO

## 2019-02-13 ENCOUNTER — Other Ambulatory Visit: Payer: Self-pay

## 2019-02-13 ENCOUNTER — Other Ambulatory Visit: Payer: Medicare HMO

## 2019-02-13 DIAGNOSIS — R6889 Other general symptoms and signs: Secondary | ICD-10-CM | POA: Diagnosis not present

## 2019-02-13 DIAGNOSIS — D509 Iron deficiency anemia, unspecified: Secondary | ICD-10-CM | POA: Diagnosis not present

## 2019-02-13 NOTE — Addendum Note (Signed)
Addended by: Earlene Plater on: 02/13/2019 11:57 AM   Modules accepted: Orders

## 2019-02-13 NOTE — Addendum Note (Signed)
Addended by: Earlene Plater on: 02/13/2019 11:56 AM   Modules accepted: Orders

## 2019-02-18 LAB — IRON AND TIBC
Iron Saturation: 31 % (ref 15–55)
Iron: 81 ug/dL (ref 27–139)
Total Iron Binding Capacity: 261 ug/dL (ref 250–450)
UIBC: 180 ug/dL (ref 118–369)

## 2019-02-18 LAB — COMPREHENSIVE METABOLIC PANEL
ALT: 16 IU/L (ref 0–32)
AST: 17 IU/L (ref 0–40)
Albumin/Globulin Ratio: 1.8 (ref 1.2–2.2)
Albumin: 4.2 g/dL (ref 3.8–4.8)
Alkaline Phosphatase: 158 IU/L — ABNORMAL HIGH (ref 39–117)
BUN/Creatinine Ratio: 24 (ref 12–28)
BUN: 33 mg/dL — ABNORMAL HIGH (ref 8–27)
Bilirubin Total: 0.3 mg/dL (ref 0.0–1.2)
CO2: 23 mmol/L (ref 20–29)
Calcium: 10.4 mg/dL — ABNORMAL HIGH (ref 8.7–10.3)
Chloride: 101 mmol/L (ref 96–106)
Creatinine, Ser: 1.36 mg/dL — ABNORMAL HIGH (ref 0.57–1.00)
GFR calc Af Amer: 46 mL/min/{1.73_m2} — ABNORMAL LOW (ref >59)
GFR calc non Af Amer: 40 mL/min/{1.73_m2} — ABNORMAL LOW (ref >59)
Globulin, Total: 2.3 g/dL (ref 1.5–4.5)
Glucose: 97 mg/dL (ref 65–99)
Potassium: 4.8 mmol/L (ref 3.5–5.2)
Sodium: 138 mmol/L (ref 134–144)
Total Protein: 6.5 g/dL (ref 6.0–8.5)

## 2019-02-18 LAB — CBC WITH DIFFERENTIAL/PLATELET
Basophils Absolute: 0 10*3/uL (ref 0.0–0.2)
Basos: 0 %
EOS (ABSOLUTE): 0.4 10*3/uL (ref 0.0–0.4)
Eos: 5 %
Hematocrit: 33.8 % — ABNORMAL LOW (ref 34.0–46.6)
Hemoglobin: 11.4 g/dL (ref 11.1–15.9)
Immature Grans (Abs): 0 10*3/uL (ref 0.0–0.1)
Immature Granulocytes: 1 %
Lymphocytes Absolute: 1.9 10*3/uL (ref 0.7–3.1)
Lymphs: 24 %
MCH: 28.7 pg (ref 26.6–33.0)
MCHC: 33.7 g/dL (ref 31.5–35.7)
MCV: 85 fL (ref 79–97)
Monocytes Absolute: 0.9 10*3/uL (ref 0.1–0.9)
Monocytes: 11 %
Neutrophils Absolute: 4.7 10*3/uL (ref 1.4–7.0)
Neutrophils: 59 %
Platelets: 477 10*3/uL — ABNORMAL HIGH (ref 150–450)
RBC: 3.97 x10E6/uL (ref 3.77–5.28)
RDW: 15.3 % (ref 11.7–15.4)
WBC: 7.9 10*3/uL (ref 3.4–10.8)

## 2019-02-18 LAB — FERRITIN: Ferritin: 513 ng/mL — ABNORMAL HIGH (ref 15–150)

## 2019-02-18 LAB — VITAMIN B12: Vitamin B-12: 955 pg/mL (ref 232–1245)

## 2019-02-18 LAB — METHYLMALONIC ACID, SERUM: Methylmalonic Acid: 273 nmol/L (ref 0–378)

## 2019-02-19 ENCOUNTER — Encounter (HOSPITAL_COMMUNITY): Payer: Self-pay | Admitting: Hematology

## 2019-02-19 ENCOUNTER — Inpatient Hospital Stay (HOSPITAL_COMMUNITY): Payer: Medicare HMO | Attending: Hematology | Admitting: Hematology

## 2019-02-19 ENCOUNTER — Other Ambulatory Visit: Payer: Self-pay

## 2019-02-19 DIAGNOSIS — D631 Anemia in chronic kidney disease: Secondary | ICD-10-CM | POA: Insufficient documentation

## 2019-02-19 DIAGNOSIS — N189 Chronic kidney disease, unspecified: Secondary | ICD-10-CM

## 2019-02-19 DIAGNOSIS — R7989 Other specified abnormal findings of blood chemistry: Secondary | ICD-10-CM | POA: Diagnosis not present

## 2019-02-19 DIAGNOSIS — D638 Anemia in other chronic diseases classified elsewhere: Secondary | ICD-10-CM | POA: Diagnosis not present

## 2019-02-19 DIAGNOSIS — E538 Deficiency of other specified B group vitamins: Secondary | ICD-10-CM

## 2019-02-19 DIAGNOSIS — E611 Iron deficiency: Secondary | ICD-10-CM

## 2019-02-19 DIAGNOSIS — D473 Essential (hemorrhagic) thrombocythemia: Secondary | ICD-10-CM | POA: Diagnosis not present

## 2019-02-19 DIAGNOSIS — I129 Hypertensive chronic kidney disease with stage 1 through stage 4 chronic kidney disease, or unspecified chronic kidney disease: Secondary | ICD-10-CM | POA: Diagnosis not present

## 2019-02-19 NOTE — Patient Instructions (Signed)
[facility] at [facility] Discharge Instructions  You were seen today by Dr. Katragadda. He went over your recent lab results. He will see you back in 3 months for labs and follow up.   Thank you for choosing [facility] at [facility] to provide your oncology and hematology care.  To afford each patient quality time with our provider, please arrive at least 15 minutes before your scheduled appointment time.   If you have a lab appointment with the Cancer Center please come in thru the  Main Entrance and check in at the main information desk  You need to re-schedule your appointment should you arrive 10 or more minutes late.  We strive to give you quality time with our providers, and arriving late affects you and other patients whose appointments are after yours.  Also, if you no show three or more times for appointments you may be dismissed from the clinic at the providers discretion.     Again, thank you for choosing [facility].  Our hope is that these requests will decrease the amount of time that you wait before being seen by our physicians.       _____________________________________________________________  Should you have questions after your visit to  Cancer Center, please contact our office at [phone] between the hours of 8:00 a.m. and 4:30 p.m.  Voicemails left after 4:00 p.m. will not be returned until the following business day.  For prescription refill requests, have your pharmacy contact our office and allow 72 hours.    Cancer Center Support Programs:   > Cancer Support Group  2nd Tuesday of the month 1pm-2pm, Journey Room    

## 2019-02-19 NOTE — Progress Notes (Signed)
[address], [place] [address]   CLINIC:  Medical Oncology/Hematology  PCP:  Dettinger, Fransisca Kaufmann, MD [place] [id number] [phone]   REASON FOR VISIT: Normocytic anemia and thrombocytosis.  CURRENT THERAPY:  Intermittent Feraheme.   INTERVAL HISTORY:  [name] 69 y.o. female is seen in follow-up visit for normocytic anemia, B12 deficiency and mild hypercalcemia.  Appetite is 100%.  Energy levels are low.  Pain in the lower back is rated as 7 out of 10.  She has a constipation alternating with diarrhea which is also stable.    REVIEW OF SYSTEMS:  Review of Systems  Cardiovascular: Positive for leg swelling.  Gastrointestinal: Positive for constipation and diarrhea.  All other systems reviewed and are negative.    PAST MEDICAL/SURGICAL HISTORY:  Past Medical History:  Diagnosis Date  . Ankle fracture, left 2006  . Diverticula, intestine    Mild case.  . Fibromyalgia    Dr. Justine Null Dx in [place] years ago  . GERD (gastroesophageal reflux disease)   . History of hiatal hernia   . Hyperlipidemia   . Hypertension    Dx age 42.    Past Surgical History:  Procedure Laterality Date  . ANKLE SURGERY Left    Broken.  Has screws and metal plate.  . CHOLECYSTECTOMY    . ROBOTIC ADRENALECTOMY Right 05/31/2018   Procedure: XI ROBOTIC RIGHT ADRENALECTOMY;  Surgeon: Ralene Ok, MD;  Location: [facility];  Service: General;  Laterality: Right;     SOCIAL HISTORY:  Social History   Socioeconomic History  . Marital status: Married    Spouse name: Not on file  . Number of children: Not on file  . Years of education: Not on file  . Highest education level: Not on file  Occupational History  . Not on file  Tobacco Use  . Smoking status: Never Smoker  . Smokeless tobacco: Never Used  Substance and Sexual Activity  . Alcohol use: No  . Drug use: No  . Sexual activity: Not on file  Other Topics Concern  . Not on  file  Social History Narrative  . Not on file   Social Determinants of Health   Financial Resource Strain:   . Difficulty of Paying Living Expenses: Not on file  Food Insecurity:   . Worried About Charity fundraiser in the Last Year: Not on file  . Ran Out of Food in the Last Year: Not on file  Transportation Needs:   . Lack of Transportation (Medical): Not on file  . Lack of Transportation (Non-Medical): Not on file  Physical Activity:   . Days of Exercise per Week: Not on file  . Minutes of Exercise per Session: Not on file  Stress:   . Feeling of Stress : Not on file  Social Connections:   . Frequency of Communication with Friends and Family: Not on file  . Frequency of Social Gatherings with Friends and Family: Not on file  . Attends Religious Services: Not on file  . Active Member of Clubs or Organizations: Not on file  . Attends Archivist Meetings: Not on file  . Marital Status: Not on file  Intimate Partner Violence:   . Fear of Current or Ex-Partner: Not on file  . Emotionally Abused: Not on file  . Physically Abused: Not on file  . Sexually Abused: Not on file    FAMILY HISTORY:  Family History  Problem Relation  Age of Onset  . Heart disease Mother        CHF  . Cancer Mother        Uterine  . Osteoporosis Mother   . COPD Sister   . Asthma Sister   . Migraines Sister     CURRENT MEDICATIONS:  Outpatient Encounter Medications as of 02/19/2019  Medication Sig  . amLODipine (NORVASC) 5 MG tablet TAKE 1 TABLET EVERY DAY  . cetirizine (ZYRTEC) 10 MG tablet Take 10 mg by mouth daily.  . Cholecalciferol (VITAMIN D-1000 MAX ST) 1000 units tablet Take 1,000 Units by mouth daily.   . DULoxetine (CYMBALTA) 60 MG capsule Take 1 capsule (60 mg total) by mouth daily.  . enalapril (VASOTEC) 20 MG tablet TAKE 1 AND 1/2 TABLETS EVERY DAY  . famotidine (PEPCID) 20 MG tablet Take 20 mg by mouth daily.  . fluticasone (FLONASE) 50 MCG/ACT nasal spray USE 2 SPRAYS  IN EACH NOSTRIL EVERY DAY  . furosemide (LASIX) 20 MG tablet Take 1 tablet (20 mg total) by mouth daily.  Marland Kitchen loratadine (CLARITIN) 10 MG tablet Take 10 mg by mouth daily.  . meloxicam (MOBIC) 15 MG tablet Take 1 tablet (15 mg total) by mouth daily.  . Omega-3 Fatty Acids (FISH OIL) 1000 MG CAPS Take 2,000 mg by mouth daily.   . pantoprazole (PROTONIX) 40 MG tablet Take 1 tablet (40 mg total) by mouth daily.  . Probiotic Product (PROBIOTIC & ACIDOPHILUS EX ST PO) Take 1 capsule by mouth at bedtime.   . vitamin B-12 (CYANOCOBALAMIN) 1000 MCG tablet Take 1,000 mcg by mouth daily.  Marland Kitchen acetaminophen (TYLENOL) 500 MG tablet Take 500 mg by mouth every 8 (eight) hours as needed for headache.  . hyoscyamine (ANASPAZ) 0.125 MG TBDP disintergrating tablet Take 1 tablet (0.125 mg total) by mouth every 6 (six) hours as needed. (Patient not taking: Reported on 02/19/2019)  . ondansetron (ZOFRAN) 4 MG tablet Take 1 tablet (4 mg total) by mouth every 8 (eight) hours as needed for nausea or vomiting. (Patient not taking: Reported on 02/19/2019)  . traMADol (ULTRAM) 50 MG tablet Take 1 tablet (50 mg total) by mouth every 8 (eight) hours as needed (mild pain). (Patient not taking: Reported on 02/19/2019)  . traZODone (DESYREL) 50 MG tablet Take 0.5-1 tablets (25-50 mg total) by mouth at bedtime as needed for sleep. (Patient not taking: Reported on 02/19/2019)  . trolamine salicylate (ASPERCREME) 10 % cream Apply 1 application topically 3 (three) times daily as needed for muscle pain.   No facility-administered encounter medications on file as of 02/19/2019.    ALLERGIES:  Allergies  Allergen Reactions  . Codeine Nausea And Vomiting  . Sulfa Antibiotics     Hair loss, yellowed skin  . Doxycycline Hyclate Itching     PHYSICAL EXAM:  ECOG Performance status: 1  Vitals:   02/19/19 1440  BP: 132/61  Pulse: 77  Resp: 18  Temp: (!) 97.1 F (36.2 C)  SpO2: 100%   Filed Weights   02/19/19 1440  Weight: 150 lb  12.8 oz (68.4 kg)    Physical Exam Constitutional:      Appearance: Normal appearance. She is normal weight.  Musculoskeletal:        General: Normal range of motion.  Skin:    General: Skin is warm and dry.  Neurological:     Mental Status: She is alert and oriented to person, place, and time. Mental status is at baseline.  Psychiatric:  Mood and Affect: Mood normal.        Behavior: Behavior normal.        Thought Content: Thought content normal.        Judgment: Judgment normal.      LABORATORY DATA:  I have reviewed the labs as listed.  CBC    Component Value Date/Time   WBC 7.9 02/13/2019 1158   WBC 10.8 (H) 06/01/2018 0415   RBC 3.97 02/13/2019 1158   RBC 3.86 (L) 11/23/2018 1150   RBC 3.19 (L) 06/01/2018 0415   HGB 11.4 02/13/2019 1158   HCT 33.8 (L) 02/13/2019 1158   PLT 477 (H) 02/13/2019 1158   MCV 85 02/13/2019 1158   MCH 28.7 02/13/2019 1158   MCH 27.3 06/01/2018 0415   MCHC 33.7 02/13/2019 1158   MCHC 30.6 06/01/2018 0415   RDW 15.3 02/13/2019 1158   LYMPHSABS 1.9 02/13/2019 1158   EOSABS 0.4 02/13/2019 1158   BASOSABS 0.0 02/13/2019 1158   CMP Latest Ref Rng & Units 02/13/2019 02/02/2019 10/13/2018  Glucose 65 - 99 mg/dL 97 - 94  BUN 8 - 27 mg/dL 33(H) - 27  Creatinine 0.57 - 1.00 mg/dL 1.36(H) - 1.36(H)  Sodium 134 - 144 mmol/L 138 - 135  Potassium 3.5 - 5.2 mmol/L 4.8 - 4.7  Chloride 96 - 106 mmol/L 101 - 98  CO2 20 - 29 mmol/L 23 - 23  Calcium 8.7 - 10.3 mg/dL 10.4(H) 10.5(H) 10.0  Total Protein 6.0 - 8.5 g/dL 6.5 - 6.8  Total Bilirubin 0.0 - 1.2 mg/dL 0.3 - 0.3  Alkaline Phos 39 - 117 IU/L 158(H) - 160(H)  AST 0 - 40 IU/L 17 - 19  ALT 0 - 32 IU/L 16 - 15       DIAGNOSTIC IMAGING:  I have independently reviewed the scans and discussed with the patient.    ASSESSMENT & PLAN:   Anemia of chronic disease 1.  Normocytic anemia: -This is from combination of CKD and iron deficiency state. -Feraheme on 12/26/2018 and  01/02/2019. -Labs from 02/13/2019 shows hemoglobin improved 11.4.  MCV is 85.  Ferritin was 513 and percent saturation was 31. -She does not require any parenteral iron therapy at this time.  2.  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 -JAK2 V617F and reflex mutation testing was negative. -After she received iron infusions, platelet count improved to 477 from 529. -We will closely monitor it.  If it continues to be high, will consider BCR/ABL by FISH testing.  3.  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  4.  Mild hypercalcemia: -She has mild hypercalcemia with PTH of 41.  Calcium on today's labs was 10.4. -She follows up with endocrinology.  5.  B12 deficiency: -Her B12 is 955.  Methylmalonic acid is 273.  Previously methylmalonic acid was elevated with a normal B12. -She is continuing B12 1 mg tablet daily.      Orders placed this encounter:  No orders of the defined types were placed in this encounter.     Derek Jack, MD [place] [phone]

## 2019-02-19 NOTE — Assessment & Plan Note (Addendum)
1.  Normocytic anemia: -This is from combination of CKD and iron deficiency state. -Feraheme on 12/26/2018 and 01/02/2019. -Labs from 02/13/2019 shows hemoglobin improved 11.4.  MCV is 85.  Ferritin was 513 and percent saturation was 31. -She does not require any parenteral iron therapy at this time.  2.  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 -JAK2 V617F and reflex mutation testing was negative. -After she received iron infusions, platelet count improved to 477 from 529. -We will closely monitor it.  If it continues to be high, will consider BCR/ABL by FISH testing.  3.  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  4.  Mild hypercalcemia: -She has mild hypercalcemia with PTH of 41.  Calcium on today's labs was 10.4. -She follows up with endocrinology.  5.  B12 deficiency: -Her B12 is 955.  Methylmalonic acid is 273.  Previously methylmalonic acid was elevated with a normal B12. -She is continuing B12 1 mg tablet daily.

## 2019-03-02 ENCOUNTER — Other Ambulatory Visit: Payer: Medicare HMO

## 2019-03-05 ENCOUNTER — Other Ambulatory Visit: Payer: Medicare HMO

## 2019-03-05 ENCOUNTER — Other Ambulatory Visit: Payer: Self-pay

## 2019-03-05 NOTE — Addendum Note (Signed)
Addended by: Earlene Plater on: 03/05/2019 12:46 PM   Modules accepted: Orders

## 2019-03-05 NOTE — Addendum Note (Signed)
Addended by: Earlene Plater on: 03/05/2019 12:45 PM   Modules accepted: Orders

## 2019-03-07 LAB — CALCIUM, URINE, 24 HOUR
Calcium, 24H Urine: 74 mg/24 hr (ref 47–462)
Calcium, Urine: 3.7 mg/dL

## 2019-03-07 LAB — CREATININE, URINE, 24 HOUR
Creatinine, 24H Ur: 736 mg/24 hr — ABNORMAL LOW (ref 800–1800)
Creatinine, Urine: 36.8 mg/dL

## 2019-03-10 LAB — BASIC METABOLIC PANEL
BUN/Creatinine Ratio: 22 (ref 12–28)
BUN: 36 mg/dL — ABNORMAL HIGH (ref 8–27)
CO2: 25 mmol/L (ref 20–29)
Calcium: 11 mg/dL — ABNORMAL HIGH (ref 8.7–10.3)
Chloride: 99 mmol/L (ref 96–106)
Creatinine, Ser: 1.65 mg/dL — ABNORMAL HIGH (ref 0.57–1.00)
GFR calc Af Amer: 37 mL/min/{1.73_m2} — ABNORMAL LOW (ref >59)
GFR calc non Af Amer: 32 mL/min/{1.73_m2} — ABNORMAL LOW (ref >59)
Glucose: 86 mg/dL (ref 65–99)
Potassium: 5.7 mmol/L — ABNORMAL HIGH (ref 3.5–5.2)
Sodium: 138 mmol/L (ref 134–144)

## 2019-03-10 LAB — PHOSPHORUS: Phosphorus: 3.6 mg/dL (ref 3.0–4.3)

## 2019-03-10 LAB — PARATHYROID HORMONE, INTACT (NO CA): PTH: 46 pg/mL (ref 15–65)

## 2019-03-10 LAB — VITAMIN D 1,25 DIHYDROXY
Vitamin D 1, 25 (OH)2 Total: 47 pg/mL
Vitamin D2 1, 25 (OH)2: <10 pg/mL
Vitamin D3 1, 25 (OH)2: 47 pg/mL

## 2019-03-10 LAB — MAGNESIUM: Magnesium: 2.2 mg/dL (ref 1.6–2.3)

## 2019-03-13 ENCOUNTER — Telehealth: Payer: Self-pay

## 2019-03-13 NOTE — Telephone Encounter (Signed)
Patient calling for lab results that she had done on 03/05/19 but I do not see that MD has resulted them yet-patient would like a call back with these results

## 2019-03-13 NOTE — Addendum Note (Signed)
Addended by: Philemon Kingdom on: 03/13/2019 12:36 PM   Modules accepted: Orders

## 2019-03-13 NOTE — Telephone Encounter (Signed)
Separate message sent

## 2019-03-14 ENCOUNTER — Telehealth: Payer: Self-pay

## 2019-03-14 NOTE — Telephone Encounter (Signed)
Notified patient of message from Dr. Cruzita Lederer, patient expressed understanding and agreement.  Patient is wondering what the scan is for? She does not recall talking about it at last visit.

## 2019-03-14 NOTE — Telephone Encounter (Signed)
-----   Message from Philemon Kingdom, MD sent at 03/13/2019 12:38 PM EST ----- Maria Moss, can you please call pt:  Maria Moss potassium is high.  Also kidney function is lower than before.  She needs to stay very well-hydrated.  She may need a referral to nephrology.  I will forward the results to Maria Moss PCP and please ask Maria Moss to contact PCP for further plan.  Please have Maria Moss hold enalapril for now until she can get in touch with Maria Moss PCP. 24-hour urine calcium is not high.  I will also go ahead and order a technetium sestamibi scan to further investigate Maria Moss parathyroid status.Marland Kitchen

## 2019-03-14 NOTE — Telephone Encounter (Signed)
Notified patient of message from Dr. Gherghe, patient expressed understanding and agreement. No further questions.  

## 2019-03-14 NOTE — Telephone Encounter (Signed)
This is to see which of the 4 parathyroid glands are overproducing PTH.  Usually this is a test performed by the surgeon, however, before sending her to surgery, I would like to see the results.

## 2019-04-10 DIAGNOSIS — Z1231 Encounter for screening mammogram for malignant neoplasm of breast: Secondary | ICD-10-CM | POA: Diagnosis not present

## 2019-04-16 ENCOUNTER — Ambulatory Visit (INDEPENDENT_AMBULATORY_CARE_PROVIDER_SITE_OTHER): Payer: Medicare HMO | Admitting: Family Medicine

## 2019-04-16 ENCOUNTER — Other Ambulatory Visit: Payer: Self-pay

## 2019-04-16 ENCOUNTER — Encounter: Payer: Self-pay | Admitting: Family Medicine

## 2019-04-16 VITALS — BP 130/79 | HR 91 | Temp 97.6°F | Ht 61.0 in | Wt 150.1 lb

## 2019-04-16 DIAGNOSIS — E782 Mixed hyperlipidemia: Secondary | ICD-10-CM | POA: Diagnosis not present

## 2019-04-16 DIAGNOSIS — I10 Essential (primary) hypertension: Secondary | ICD-10-CM | POA: Diagnosis not present

## 2019-04-16 DIAGNOSIS — N179 Acute kidney failure, unspecified: Secondary | ICD-10-CM

## 2019-04-16 DIAGNOSIS — M5136 Other intervertebral disc degeneration, lumbar region: Secondary | ICD-10-CM | POA: Diagnosis not present

## 2019-04-16 DIAGNOSIS — N1832 Chronic kidney disease, stage 3b: Secondary | ICD-10-CM

## 2019-04-16 LAB — CBC WITH DIFFERENTIAL/PLATELET
Basophils Absolute: 0 10*3/uL (ref 0.0–0.2)
Basos: 0 %
EOS (ABSOLUTE): 0.5 10*3/uL — ABNORMAL HIGH (ref 0.0–0.4)
Eos: 5 %
Hematocrit: 35.6 % (ref 34.0–46.6)
Hemoglobin: 11.8 g/dL (ref 11.1–15.9)
Immature Grans (Abs): 0 10*3/uL (ref 0.0–0.1)
Immature Granulocytes: 0 %
Lymphocytes Absolute: 2.1 10*3/uL (ref 0.7–3.1)
Lymphs: 26 %
MCH: 28.9 pg (ref 26.6–33.0)
MCHC: 33.1 g/dL (ref 31.5–35.7)
MCV: 87 fL (ref 79–97)
Monocytes Absolute: 1 10*3/uL — ABNORMAL HIGH (ref 0.1–0.9)
Monocytes: 12 %
Neutrophils Absolute: 4.8 10*3/uL (ref 1.4–7.0)
Neutrophils: 57 %
Platelets: 562 10*3/uL — ABNORMAL HIGH (ref 150–450)
RBC: 4.08 x10E6/uL (ref 3.77–5.28)
RDW: 12.8 % (ref 11.7–15.4)
WBC: 8.4 10*3/uL (ref 3.4–10.8)

## 2019-04-16 LAB — LIPID PANEL
Chol/HDL Ratio: 4.2 ratio (ref 0.0–4.4)
Cholesterol, Total: 251 mg/dL — ABNORMAL HIGH (ref 100–199)
HDL: 60 mg/dL (ref >39)
LDL Chol Calc (NIH): 144 mg/dL — ABNORMAL HIGH (ref 0–99)
Triglycerides: 260 mg/dL — ABNORMAL HIGH (ref 0–149)
VLDL Cholesterol Cal: 47 mg/dL — ABNORMAL HIGH (ref 5–40)

## 2019-04-16 LAB — CMP14+EGFR
ALT: 38 IU/L — ABNORMAL HIGH (ref 0–32)
AST: 32 IU/L (ref 0–40)
Albumin/Globulin Ratio: 2 (ref 1.2–2.2)
Albumin: 4.5 g/dL (ref 3.8–4.8)
Alkaline Phosphatase: 178 IU/L — ABNORMAL HIGH (ref 39–117)
BUN/Creatinine Ratio: 15 (ref 12–28)
BUN: 19 mg/dL (ref 8–27)
Bilirubin Total: 0.6 mg/dL (ref 0.0–1.2)
CO2: 25 mmol/L (ref 20–29)
Calcium: 10.6 mg/dL — ABNORMAL HIGH (ref 8.7–10.3)
Chloride: 100 mmol/L (ref 96–106)
Creatinine, Ser: 1.26 mg/dL — ABNORMAL HIGH (ref 0.57–1.00)
GFR calc Af Amer: 51 mL/min/{1.73_m2} — ABNORMAL LOW (ref >59)
GFR calc non Af Amer: 44 mL/min/{1.73_m2} — ABNORMAL LOW (ref >59)
Globulin, Total: 2.3 g/dL (ref 1.5–4.5)
Glucose: 103 mg/dL — ABNORMAL HIGH (ref 65–99)
Potassium: 4.8 mmol/L (ref 3.5–5.2)
Sodium: 140 mmol/L (ref 134–144)
Total Protein: 6.8 g/dL (ref 6.0–8.5)

## 2019-04-16 MED ORDER — TRAMADOL HCL 50 MG PO TABS: 50.0000 mg | ORAL_TABLET | Freq: Three times a day (TID) | ORAL | 2 refills | Status: DC | PRN

## 2019-04-16 NOTE — Progress Notes (Signed)
BP 130/79   Pulse 91   Temp 97.6 F (36.4 C) (Temporal)   Ht _0  (1.549 m)   Wt 150 lb 2 oz (68.1 kg)   BMI 28.37 kg/m    Subjective:   Patient ID: Maria Moss, female    DOB: August 15, 1950, 69 y.o.   MRN: [id number]  HPI: Maria Moss is a 69 y.o. female presenting on 04/16/2019 for Medical Management of Chronic Issues   HPI Hypertension Patient is currently on amlodipine, and their blood pressure today is 130/79. Patient denies any lightheadedness or dizziness. Patient denies headaches, blurred vision, chest pains, shortness of breath, or weakness. Denies any side effects from medication and is content with current medication.   Hyperlipidemia Patient is coming in for recheck of his hyperlipidemia. The patient is currently taking fish oil. They deny any issues with myalgias or history of liver damage from it. They deny any focal numbness or weakness or chest pain.   Pain assessment: Cause of pain-degenerative disc disease lumbar Pain location-lower back, some goes down into her legs at times Pain on scale of 1-10-5 Frequency-Daily What increases pain-everything What makes pain Better-tramadol helps some Effects on ADL -has some limitations due to pain in ambulation or standing for periods of time more than a few minutes Any change in general medical condition-worsening renal disease and anemia  Current opioids rx-tramadol 50 mg 3 times daily as needed # meds rx-90 Effectiveness of current meds-helps for the most part Adverse reactions form pain meds-none Morphine equivalent-15  Pill count performed-No Last drug screen -10/18/2018 ( high risk q40m moderate risk q671mlow risk yearly ) Urine drug screen today- No [place]  If yes were their any concerning findings? -None   No flowsheet data found.   Pain contract signed on: 10/18/2018  Relevant past medical, surgical, family and social history reviewed and updated as indicated. Interim medical history  since our last visit reviewed. Allergies and medications reviewed and updated.  Review of Systems  Constitutional: Negative for chills and fever.  Eyes: Negative for visual disturbance.  Respiratory: Negative for chest tightness and shortness of breath.   Cardiovascular: Negative for chest pain and leg swelling.  Genitourinary: Negative for difficulty urinating and dysuria.  Musculoskeletal: Positive for arthralgias and back pain. Negative for gait problem.  Skin: Negative for rash.  Neurological: Negative for light-headedness and headaches.  Psychiatric/Behavioral: Negative for agitation and behavioral problems.  All other systems reviewed and are negative.   Per HPI unless specifically indicated above   Allergies as of 04/16/2019      Reactions   Codeine Nausea And Vomiting   Sulfa Antibiotics    Hair loss, yellowed skin   Doxycycline Hyclate Itching      Medication List       Accurate as of April 16, 2019 11:34 AM. If you have any questions, ask your nurse or doctor.        STOP taking these medications   enalapril 20 MG tablet Commonly known as: VASOTEC Stopped by: JoFransisca Kaufmannettinger, MD   traZODone 50 MG tablet Commonly known as: DESYREL Stopped by: JoWorthy RancherMD     TAKE these medications   acetaminophen 500 MG tablet Commonly known as: TYLENOL Take 500 mg by mouth every 8 (eight) hours as needed for headache.   amLODipine 5 MG tablet Commonly known as: NORVASC TAKE 1 TABLET EVERY DAY   cetirizine 10 MG tablet Commonly known as: ZYRTEC Take 10  mg by mouth daily.   Claritin 10 MG tablet Generic drug: loratadine Take 10 mg by mouth daily.   DULoxetine 60 MG capsule Commonly known as: CYMBALTA Take 1 capsule (60 mg total) by mouth daily.   famotidine 20 MG tablet Commonly known as: PEPCID Take 20 mg by mouth daily.   Fish Oil 1000 MG Caps Take 2,000 mg by mouth daily.   fluticasone 50 MCG/ACT nasal spray Commonly known as:  FLONASE USE 2 SPRAYS IN EACH NOSTRIL EVERY DAY   furosemide 20 MG tablet Commonly known as: LASIX Take 1 tablet (20 mg total) by mouth daily.   GIN-ZING PO Take by mouth.   hyoscyamine 0.125 MG Tbdp disintergrating tablet Commonly known as: ANASPAZ Take 1 tablet (0.125 mg total) by mouth every 6 (six) hours as needed.   meloxicam 15 MG tablet Commonly known as: MOBIC Take 1 tablet (15 mg total) by mouth daily.   NON FORMULARY Forebrain otc-for memory   ondansetron 4 MG tablet Commonly known as: Zofran Take 1 tablet (4 mg total) by mouth every 8 (eight) hours as needed for nausea or vomiting.   pantoprazole 40 MG tablet Commonly known as: PROTONIX Take 1 tablet (40 mg total) by mouth daily.   PROBIOTIC & ACIDOPHILUS EX ST PO Take 1 capsule by mouth at bedtime.   traMADol 50 MG tablet Commonly known as: ULTRAM Take 1 tablet (50 mg total) by mouth every 8 (eight) hours as needed (mild pain).   trolamine salicylate 10 % cream Commonly known as: ASPERCREME Apply 1 application topically 3 (three) times daily as needed for muscle pain.   vitamin B-12 1000 MCG tablet Commonly known as: CYANOCOBALAMIN Take 1,000 mcg by mouth daily.   Vitamin D-1000 Max St 25 MCG (1000 UT) tablet Generic drug: Cholecalciferol Take 1,000 Units by mouth daily.        Objective:   BP 130/79   Pulse 91   Temp 97.6 F (36.4 C) (Temporal)   Ht _0  (1.549 m)   Wt 150 lb 2 oz (68.1 kg)   BMI 28.37 kg/m   Wt Readings from Last 3 Encounters:  04/16/19 150 lb 2 oz (68.1 kg)  02/19/19 150 lb 12.8 oz (68.4 kg)  01/15/19 149 lb 6.4 oz (67.8 kg)    Physical Exam Vitals and nursing note reviewed.  Constitutional:      General: She is not in acute distress.    Appearance: She is well-developed. She is not diaphoretic.  Eyes:     Conjunctiva/sclera: Conjunctivae normal.     Pupils: Pupils are equal, round, and reactive to light.  Cardiovascular:     Rate and Rhythm: Normal rate and  regular rhythm.     Heart sounds: Normal heart sounds. No murmur.  Pulmonary:     Effort: Pulmonary effort is normal. No respiratory distress.     Breath sounds: Normal breath sounds. No wheezing.  Skin:    General: Skin is warm and dry.     Findings: No rash.  Neurological:     Mental Status: She is alert and oriented to person, place, and time.     Coordination: Coordination normal.  Psychiatric:        Behavior: Behavior normal.       Assessment & Plan:   Problem List Items Addressed This Visit      Cardiovascular and Mediastinum   Hypertension (Chronic)   Relevant Orders   CMP14+EGFR     Musculoskeletal and Integument   Degenerative  disc disease, lumbar (Chronic)   Relevant Medications   traMADol (ULTRAM) 50 MG tablet     Other   Hyperlipemia (Chronic)   Relevant Orders   Lipid panel    Other Visit Diagnoses    Acute renal failure superimposed on stage 3b chronic kidney disease, unspecified acute renal failure type ([place])    -  Primary   Relevant Orders   CMP14+EGFR   CBC with Differential/Platelet      Continue current medication, no changes, with renal function being up we will check today, if still up then we will do referral to nephrology in the future. Follow up plan: Return in about 3 months (around 07/16/2019), or if symptoms worsen or fail to improve, for Pain and hypertension and cholesterol.  Counseling provided for all of the vaccine components Orders Placed This Encounter  Procedures  . CMP14+EGFR  . Lipid panel  . CBC with Differential/Platelet    Caryl Pina, MD [facility] 04/16/2019, 11:34 AM

## 2019-04-17 ENCOUNTER — Telehealth: Payer: Self-pay | Admitting: Internal Medicine

## 2019-04-17 NOTE — Telephone Encounter (Signed)
Patient called stating she was under the impression that we were contacting her insurance for approval for a scan for her thyroid - she was just checking in on this matter. Ph# [phone]

## 2019-04-18 NOTE — Telephone Encounter (Signed)
No approval is needed for the procedure per insurance. Order was sent to the appropriate department on 03/15/19-they have not reached out to the pt to set up  Cyndi can you please call them to get this set up for the pt so she doesn't have to keep waiting

## 2019-04-25 ENCOUNTER — Telehealth: Payer: Self-pay | Admitting: Family Medicine

## 2019-04-25 NOTE — Telephone Encounter (Signed)
Aware.Normal hemoglobin of 11.8.

## 2019-04-27 ENCOUNTER — Other Ambulatory Visit: Payer: Self-pay

## 2019-04-27 ENCOUNTER — Ambulatory Visit (INDEPENDENT_AMBULATORY_CARE_PROVIDER_SITE_OTHER): Payer: Medicare HMO | Admitting: Family

## 2019-04-27 ENCOUNTER — Encounter: Payer: Self-pay | Admitting: Family

## 2019-04-27 VITALS — BP 146/89 | HR 106 | Temp 97.9°F | Ht 61.0 in | Wt 153.4 lb

## 2019-04-27 DIAGNOSIS — L239 Allergic contact dermatitis, unspecified cause: Secondary | ICD-10-CM | POA: Diagnosis not present

## 2019-04-27 MED ORDER — HYDROXYZINE PAMOATE 25 MG PO CAPS: 25.0000 mg | ORAL_CAPSULE | Freq: Three times a day (TID) | ORAL | 0 refills | Status: DC | PRN

## 2019-04-27 MED ORDER — METHYLPREDNISOLONE ACETATE 80 MG/ML IJ SUSP
80.0000 mg | Freq: Once | INTRAMUSCULAR | Status: AC
  Administered 2019-04-27: 80 mg via INTRAMUSCULAR

## 2019-04-27 MED ORDER — TRIAMCINOLONE ACETONIDE 0.5 % EX OINT: 1.0000 "application " | TOPICAL_OINTMENT | Freq: Two times a day (BID) | CUTANEOUS | 0 refills | Status: DC

## 2019-04-27 NOTE — Progress Notes (Signed)
Subjective:    Patient ID: Maria Moss, female    DOB: 11/07/1950, 69 y.o.   MRN: [id number]  Chief Complaint  Patient presents with  . Leg Swelling    red itching   . Pruritis  . Fatigue   Pt presents to the office today with a rash that started on her right lower leg over a month ago, however, it has spread to her left leg and bilateral arms.  Rash This is a new problem. The current episode started more than 1 month ago. The problem has been gradually worsening since onset. The affected locations include the right arm, left foot, left arm and right lower leg. The rash is characterized by redness, itchiness and burning. Associated symptoms include fatigue. Pertinent negatives include no congestion, cough, diarrhea, fever, shortness of breath or sore throat. Past treatments include anti-itch cream (antifungal cream, corn starch). The treatment provided no relief.      Review of Systems  Constitutional: Positive for fatigue. Negative for fever.  HENT: Negative for congestion and sore throat.   Respiratory: Negative for cough and shortness of breath.   Gastrointestinal: Negative for diarrhea.  Skin: Positive for rash.  All other systems reviewed and are negative.      Objective:   Physical Exam Vitals reviewed.  Constitutional:      General: She is not in acute distress.    Appearance: She is well-developed.  HENT:     Head: Normocephalic and atraumatic.  Eyes:     Pupils: Pupils are equal, round, and reactive to light.  Neck:     Thyroid: No thyromegaly.  Cardiovascular:     Rate and Rhythm: Normal rate and regular rhythm.     Heart sounds: Normal heart sounds. No murmur.  Pulmonary:     Effort: Pulmonary effort is normal. No respiratory distress.     Breath sounds: Normal breath sounds. No wheezing.  Abdominal:     General: Bowel sounds are normal. There is no distension.     Palpations: Abdomen is soft.     Tenderness: There is no abdominal tenderness.   Musculoskeletal:        General: No tenderness. Normal range of motion.     Cervical back: Normal range of motion and neck supple.     Right lower leg: Edema (trace) present.     Comments: Negative Homan's sign  Skin:    General: Skin is warm and dry.     Findings: Erythema present.          Comments: Generalized Erythemas, blister rash on bilateral lower legs and bilateral arms  Neurological:     Mental Status: She is alert and oriented to person, place, and time.     Cranial Nerves: No cranial nerve deficit.     Deep Tendon Reflexes: Reflexes are normal and symmetric.  Psychiatric:        Behavior: Behavior normal.        Thought Content: Thought content normal.        Judgment: Judgment normal.       BP (!) 146/89   Pulse (!) 106   Temp 97.9 F (36.6 C) (Temporal)   Ht 5\' 1"  (1.549 m)   Wt 153 lb 6.4 oz (69.6 kg)   SpO2 99%   BMI 28.98 kg/m      Assessment & Plan:  Garvin Fila Draheim comes in today with chief complaint of Leg Swelling (red itching ), Pruritis, and Fatigue   Diagnosis and  orders addressed:  1. Allergic contact dermatitis, unspecified trigger Avoid allergens  Given rash is spreading and itching, will given steroid injection today. Avoid scratching.  If redness worsens or develops fever or tenderness call office.  - methylPREDNISolone acetate (DEPO-MEDROL) injection 80 mg - hydrOXYzine (VISTARIL) 25 MG capsule; Take 1 capsule (25 mg total) by mouth 3 (three) times daily as needed.  Dispense: 30 capsule; Refill: 0 - triamcinolone ointment (KENALOG) 0.5 %; Apply 1 application topically 2 (two) times daily.  Dispense: 30 g; Refill: 0   [name], FNP

## 2019-04-27 NOTE — Patient Instructions (Signed)
Contact Dermatitis Dermatitis is redness, soreness, and swelling (inflammation) of the skin. Contact dermatitis is a reaction to certain substances that touch the skin. Many different substances can cause contact dermatitis. There are two types of contact dermatitis:  Irritant contact dermatitis. This type is caused by something that irritates your skin, such as having dry hands from washing them too often with soap. This type does not require previous exposure to the substance for a reaction to occur. This is the most common type.  Allergic contact dermatitis. This type is caused by a substance that you are allergic to, such as poison ivy. This type occurs when you have been exposed to the substance (allergen) and develop a sensitivity to it. Dermatitis may develop soon after your first exposure to the allergen, or it may not develop until the next time you are exposed and every time thereafter. What are the causes? Irritant contact dermatitis is most commonly caused by exposure to:  Makeup.  Soaps.  Detergents.  Bleaches.  Acids.  Metal salts, such as nickel. Allergic contact dermatitis is most commonly caused by exposure to:  Poisonous plants.  Chemicals.  Jewelry.  Latex.  Medicines.  Preservatives in products, such as clothing. What increases the risk? You are more likely to develop this condition if you have:  A job that exposes you to irritants or allergens.  Certain medical conditions, such as asthma or eczema. What are the signs or symptoms? Symptoms of this condition may occur on your body anywhere the irritant has touched you or is touched by you.  Symptoms include: ? Dryness or flaking. ? Redness. ? Cracks. ? Itching. ? Pain or a burning feeling. ? Blisters. ? Drainage of small amounts of blood or clear fluid from skin cracks. With allergic contact dermatitis, there may also be swelling in areas such as the eyelids, mouth, or genitals. How is this  diagnosed? This condition is diagnosed with a medical history and physical exam.  A patch skin test may be performed to help determine the cause.  If the condition is related to your job, you may need to see an occupational medicine specialist. How is this treated? This condition is treated by checking for the cause of the reaction and protecting your skin from further contact. Treatment may also include:  Steroid creams or ointments. Oral steroid medicines may be needed in more severe cases.  Antibiotic medicines or antibacterial ointments, if a skin infection is present.  Antihistamine lotion or an antihistamine taken by mouth to ease itching.  A bandage (dressing). Follow these instructions at home: Skin care  Moisturize your skin as needed.  Apply cool compresses to the affected areas.  Try applying baking soda paste to your skin. Stir water into baking soda until it reaches a paste-like consistency.  Do not scratch your skin, and avoid friction to the affected area.  Avoid the use of soaps, perfumes, and dyes. Medicines  Take or apply over-the-counter and prescription medicines only as told by your health care provider.  If you were prescribed an antibiotic medicine, take or apply the antibiotic as told by your health care provider. Do not stop using the antibiotic even if your condition improves. Bathing  Try taking a bath with: ? Epsom salts. Follow the instructions on the packaging. You can get these at your local pharmacy or grocery store. ? Baking soda. Pour a small amount into the bath as directed by your health care provider. ? Colloidal oatmeal. Follow the instructions on the   packaging. You can get this at your local pharmacy or grocery store.  Bathe less frequently, such as every other day.  Bathe in lukewarm water. Avoid using hot water. Bandage care  If you were given a bandage (dressing), change it as told by your health care provider.  Wash your hands  with soap and water before and after you change your dressing. If soap and water are not available, use hand sanitizer. General instructions  Avoid the substance that caused your reaction. If you do not know what caused it, keep a journal to try to track what caused it. Write down: ? What you eat. ? What cosmetic products you use. ? What you drink. ? What you wear in the affected area. This includes jewelry.  Check the affected areas every day for signs of infection. Check for: ? More redness, swelling, or pain. ? More fluid or blood. ? Warmth. ? Pus or a bad smell.  Keep all follow-up visits as told by your health care provider. This is important. Contact a health care provider if:  Your condition does not improve with treatment.  Your condition gets worse.  You have signs of infection such as swelling, tenderness, redness, soreness, or warmth in the affected area.  You have a fever.  You have new symptoms. Get help right away if:  You have a severe headache, neck pain, or neck stiffness.  You vomit.  You feel very sleepy.  You notice red streaks coming from the affected area.  Your bone or joint underneath the affected area becomes painful after the skin has healed.  The affected area turns darker.  You have difficulty breathing. Summary  Dermatitis is redness, soreness, and swelling (inflammation) of the skin. Contact dermatitis is a reaction to certain substances that touch the skin.  Symptoms of this condition may occur on your body anywhere the irritant has touched you or is touched by you.  This condition is treated by figuring out what caused the reaction and protecting your skin from further contact. Treatment may also include medicines and skin care.  Avoid the substance that caused your reaction. If you do not know what caused it, keep a journal to try to track what caused it.  Contact a health care provider if your condition gets worse or you have signs  of infection such as swelling, tenderness, redness, soreness, or warmth in the affected area. This information is not intended to replace advice given to you by your health care provider. Make sure you discuss any questions you have with your health care provider. Document Revised: 04/12/2018 Document Reviewed: 07/06/2017 Elsevier Patient Education  2020 [employer].  

## 2019-05-03 ENCOUNTER — Other Ambulatory Visit (HOSPITAL_COMMUNITY): Payer: Medicare HMO

## 2019-05-03 ENCOUNTER — Encounter (HOSPITAL_COMMUNITY)
Admission: RE | Admit: 2019-05-03 | Discharge: 2019-05-03 | Disposition: A | Discharge status: Home / Self Care | Payer: Medicare HMO | Source: Ambulatory Visit | Attending: Internal Medicine | Admitting: Internal Medicine

## 2019-05-03 ENCOUNTER — Other Ambulatory Visit: Payer: Self-pay

## 2019-05-03 DIAGNOSIS — E213 Hyperparathyroidism, unspecified: Secondary | ICD-10-CM | POA: Diagnosis not present

## 2019-05-03 DIAGNOSIS — D351 Benign neoplasm of parathyroid gland: Secondary | ICD-10-CM | POA: Diagnosis not present

## 2019-05-03 MED ORDER — TECHNETIUM TC 99M SESTAMIBI GENERIC - CARDIOLITE
25.8000 | Freq: Once | INTRAVENOUS | Status: AC
  Administered 2019-05-03: 25.8 via INTRAVENOUS

## 2019-05-03 NOTE — Addendum Note (Signed)
Addended by: Philemon Kingdom on: 05/03/2019 04:09 PM   Modules accepted: Orders

## 2019-05-04 ENCOUNTER — Telehealth: Payer: Self-pay

## 2019-05-04 NOTE — Telephone Encounter (Signed)
Spoke to patient and her daughter and gave message from Dr. Cruzita Lederer.  They will call to schedule the [name] if they are not contacted by mid next week.

## 2019-05-04 NOTE — Telephone Encounter (Signed)
-----   Message from Philemon Kingdom, MD sent at 05/03/2019  4:11 PM EDT ----- Lenna Sciara, can you please call pt: The parathyroid scan is back and this was positive for parathyroid adenoma.  I would like to refer her to surgery for further investigation for this.  However, the scan also showed a possible right thyroid nodule.  I ordered a thyroid ultrasound to be done downstairs in [place] [facility], I hope is okay.  They will contact her to schedule it.  The referral to surgery, will need to wait until we have the results of the ultrasound.

## 2019-05-09 ENCOUNTER — Other Ambulatory Visit: Payer: Self-pay | Admitting: Internal Medicine

## 2019-05-11 ENCOUNTER — Ambulatory Visit
Admission: RE | Admit: 2019-05-11 | Discharge: 2019-05-11 | Disposition: A | Discharge status: Home / Self Care | Payer: Medicare HMO | Source: Ambulatory Visit | Attending: Internal Medicine | Admitting: Internal Medicine

## 2019-05-11 DIAGNOSIS — E041 Nontoxic single thyroid nodule: Secondary | ICD-10-CM

## 2019-05-11 NOTE — Addendum Note (Signed)
Addended by: Philemon Kingdom on: 05/11/2019 05:44 PM   Modules accepted: Orders

## 2019-05-14 ENCOUNTER — Telehealth: Payer: Self-pay

## 2019-05-14 NOTE — Telephone Encounter (Signed)
Notified patient of message from Dr. Gherghe, patient expressed understanding and agreement. No further questions.  

## 2019-05-14 NOTE — Telephone Encounter (Signed)
-----   Message from Philemon Kingdom, MD sent at 05/11/2019  5:42 PM EDT ----- Maria Moss, can you please call pt: Good news: The thyroid ultrasound results are back and they show a right thyroid nodule but this does not appear worrisome and no investigation is needed for it.  I will proceed with a referral to surgery, as discussed.

## 2019-05-16 ENCOUNTER — Ambulatory Visit: Payer: Self-pay | Admitting: Surgery

## 2019-05-16 DIAGNOSIS — E041 Nontoxic single thyroid nodule: Secondary | ICD-10-CM | POA: Diagnosis not present

## 2019-05-16 DIAGNOSIS — E210 Primary hyperparathyroidism: Secondary | ICD-10-CM | POA: Diagnosis not present

## 2019-05-18 NOTE — Patient Instructions (Addendum)
DUE TO COVID-19 ONLY ONE VISITOR IS ALLOWED TO COME WITH YOU AND STAY IN THE WAITING ROOM ONLY DURING PRE OP AND PROCEDURE DAY OF SURGERY. TWO  VISITOR MAY VISIT WITH YOU AFTER SURGERY IN YOUR PRIVATE ROOM DURING VISITING HOURS ONLY!  10a-8p  YOU NEED TO HAVE A COVID 19 TEST ON_5-21-21`______ @_1 :55______, THIS TEST MUST BE DONE BEFORE SURGERY, COME  [address], [place] , [address].  ([place]) ONCE YOUR COVID TEST IS COMPLETED, PLEASE BEGIN THE QUARANTINE INSTRUCTIONS AS OUTLINED IN YOUR HANDOUT.                Maria Moss  05/18/2019   Your procedure is scheduled on: 05-29-19   Report to [facility] Main  Entrance   Report to  Short stay   at        0530 AM     Call this number if you have problems the morning of surgery [phone]    Remember: Do not eat food or drink liquids :After Midnight.   BRUSH YOUR TEETH MORNING OF SURGERY AND RINSE YOUR MOUTH OUT, NO CHEWING GUM CANDY OR MINTS.     Take these medicines the morning of surgery with A SIP OF WATER: amlodipine, cymbalta,  zyrtec, flonase                                 You may not have any metal on your body including hair pins and              piercings  Do not wear jewelry, make-up, lotions, powders or perfumes, deodorant             Do not wear nail polish on your fingernails.  Do not shave  48 hours prior to surgery.     Do not bring valuables to the hospital. [place].  Contacts, dentures or bridgework may not be worn into surgery.     Patients discharged the day of surgery will not be allowed to drive home. IF YOU ARE HAVING SURGERY AND GOING HOME THE SAME DAY, YOU MUST HAVE AN ADULT TO DRIVE YOU HOME AND BE WITH YOU FOR 24 HOURS. YOU MAY GO HOME BY TAXI OR UBER OR ORTHERWISE, BUT AN ADULT MUST ACCOMPANY YOU HOME AND STAY WITH YOU FOR 24 HOURS.  Name and phone number of your driver:  Special Instructions: N/A              Please  read over the following fact sheets you were given: _____________________________________________________________________             [facility] - Preparing for Surgery Before surgery, you can play an important role.  Because skin is not sterile, your skin needs to be as free of germs as possible.  You can reduce the number of germs on your skin by washing with CHG (chlorahexidine gluconate) soap before surgery.  CHG is an antiseptic cleaner which kills germs and bonds with the skin to continue killing germs even after washing. Please DO NOT use if you have an allergy to CHG or antibacterial soaps.  If your skin becomes reddened/irritated stop using the CHG and inform your nurse when you arrive at Short Stay. Do not shave (including legs and underarms) for at least 48 hours prior to  the first CHG shower.  You may shave your face/neck. Please follow these instructions carefully:  1.  Shower with CHG Soap the night before surgery and the  morning of Surgery.  2.  If you choose to wash your hair, wash your hair first as usual with your  normal  shampoo.  3.  After you shampoo, rinse your hair and body thoroughly to remove the  shampoo.                           4.  Use CHG as you would any other liquid soap.  You can apply chg directly  to the skin and wash                       Gently with a scrungie or clean washcloth.  5.  Apply the CHG Soap to your body ONLY FROM THE NECK DOWN.   Do not use on face/ open                           Wound or open sores. Avoid contact with eyes, ears mouth and genitals (private parts).                       Wash face,  Genitals (private parts) with your normal soap.             6.  Wash thoroughly, paying special attention to the area where your surgery  will be performed.  7.  Thoroughly rinse your body with warm water from the neck down.  8.  DO NOT shower/wash with your normal soap after using and rinsing off  the CHG Soap.                9.  Pat yourself dry  with a clean towel.            10.  Wear clean pajamas.            11.  Place clean sheets on your bed the night of your first shower and do not  sleep with pets. Day of Surgery : Do not apply any lotions/deodorants the morning of surgery.  Please wear clean clothes to the hospital/surgery center.  FAILURE TO FOLLOW THESE INSTRUCTIONS MAY RESULT IN THE CANCELLATION OF YOUR SURGERY PATIENT SIGNATURE_________________________________  NURSE SIGNATURE__________________________________  ________________________________________________________________________

## 2019-05-21 ENCOUNTER — Other Ambulatory Visit: Payer: Medicare HMO

## 2019-05-21 ENCOUNTER — Other Ambulatory Visit (HOSPITAL_COMMUNITY): Payer: Self-pay | Admitting: *Deleted

## 2019-05-21 ENCOUNTER — Inpatient Hospital Stay (HOSPITAL_COMMUNITY): Payer: Medicare HMO | Attending: Hematology

## 2019-05-21 ENCOUNTER — Other Ambulatory Visit: Payer: Self-pay

## 2019-05-21 DIAGNOSIS — D75839 Thrombocytosis, unspecified: Secondary | ICD-10-CM

## 2019-05-21 DIAGNOSIS — D638 Anemia in other chronic diseases classified elsewhere: Secondary | ICD-10-CM | POA: Diagnosis not present

## 2019-05-21 DIAGNOSIS — D473 Essential (hemorrhagic) thrombocythemia: Secondary | ICD-10-CM | POA: Diagnosis not present

## 2019-05-21 DIAGNOSIS — R6889 Other general symptoms and signs: Secondary | ICD-10-CM | POA: Diagnosis not present

## 2019-05-22 ENCOUNTER — Inpatient Hospital Stay (HOSPITAL_BASED_OUTPATIENT_CLINIC_OR_DEPARTMENT_OTHER): Payer: Medicare HMO | Admitting: Hematology

## 2019-05-22 DIAGNOSIS — D75839 Thrombocytosis, unspecified: Secondary | ICD-10-CM

## 2019-05-22 DIAGNOSIS — D473 Essential (hemorrhagic) thrombocythemia: Secondary | ICD-10-CM

## 2019-05-22 DIAGNOSIS — D509 Iron deficiency anemia, unspecified: Secondary | ICD-10-CM

## 2019-05-22 LAB — VITAMIN B12: Vitamin B-12: 1786 pg/mL — ABNORMAL HIGH (ref 232–1245)

## 2019-05-22 LAB — CBC WITH DIFFERENTIAL/PLATELET
Basophils Absolute: 0 10*3/uL (ref 0.0–0.2)
Basos: 0 %
EOS (ABSOLUTE): 0.5 10*3/uL — ABNORMAL HIGH (ref 0.0–0.4)
Eos: 4 %
Hematocrit: 38.5 % (ref 34.0–46.6)
Hemoglobin: 13 g/dL (ref 11.1–15.9)
Immature Grans (Abs): 0.1 10*3/uL (ref 0.0–0.1)
Immature Granulocytes: 1 %
Lymphocytes Absolute: 1.9 10*3/uL (ref 0.7–3.1)
Lymphs: 17 %
MCH: 30 pg (ref 26.6–33.0)
MCHC: 33.8 g/dL (ref 31.5–35.7)
MCV: 89 fL (ref 79–97)
Monocytes Absolute: 1 10*3/uL — ABNORMAL HIGH (ref 0.1–0.9)
Monocytes: 9 %
Neutrophils Absolute: 8 10*3/uL — ABNORMAL HIGH (ref 1.4–7.0)
Neutrophils: 69 %
Platelets: 537 10*3/uL — ABNORMAL HIGH (ref 150–450)
RBC: 4.33 x10E6/uL (ref 3.77–5.28)
RDW: 12 % (ref 11.7–15.4)
WBC: 11.5 10*3/uL — ABNORMAL HIGH (ref 3.4–10.8)

## 2019-05-22 LAB — IRON AND TIBC
Iron Saturation: 23 % (ref 15–55)
Iron: 63 ug/dL (ref 27–139)
Total Iron Binding Capacity: 280 ug/dL (ref 250–450)
UIBC: 217 ug/dL (ref 118–369)

## 2019-05-22 LAB — FERRITIN: Ferritin: 271 ng/mL — ABNORMAL HIGH (ref 15–150)

## 2019-05-22 NOTE — Progress Notes (Signed)
Virtual Visit via Telephone Note  I connected with Maria Moss on 05/22/19 at  3:00 PM EDT by telephone and verified that I am speaking with the correct person using two identifiers.   I discussed the limitations, risks, security and privacy concerns of performing an evaluation and management service by telephone and the availability of in person appointments. I also discussed with the patient that there may be a patient responsible charge related to this service. The patient expressed understanding and agreed to proceed.   History of Present Illness: She is followed in our clinic for normocytic anemia, reactive thrombocytosis and mild hypercalcemia.  She also has B12 deficiency.  She receives intermittent iron infusions, last Feraheme on 01/02/2019.   Observations/Objective: She reports feeling weak and tired.  Appetite is 75%.  Energy levels are 25%.  Reports some shortness of breath on activity.  She is reportedly having a parathyroid surgery on 05/29/2019.  Denies any bleeding per rectum or melena.  Assessment and Plan:  1.  Normocytic anemia: -This is from combination of CKD and iron deficiency state. -Last Feraheme was on 01/02/2019. -CBC on 05/21/2019 shows hemoglobin 13.  Platelet count is elevated at 537.  Ferritin is 271 and percent saturation is 23. -She does not require any parenteral iron therapy.  However will check BCR/ABL by FISH. -Previous JAK2 V617F and reflex mutation testing was negative.  2.  Mild hypercalcemia: -She has hypercalcemia due to hyperparathyroidism.  She is having surgery on 05/29/2019.  3.  B12 deficiency: -Latest labs B12 was normal.  She will continue B12 1 mg tablet daily.   Follow Up Instructions: RTC in 3 months with labs.   I discussed the assessment and treatment plan with the patient. The patient was provided an opportunity to ask questions and all were answered. The patient agreed with the plan and demonstrated an understanding of the  instructions.   The patient was advised to call back or seek an in-person evaluation if the symptoms worsen or if the condition fails to improve as anticipated.  I provided 11 minutes of non-face-to-face time during this encounter.   Derek Jack, MD

## 2019-05-23 ENCOUNTER — Other Ambulatory Visit: Payer: Self-pay

## 2019-05-23 ENCOUNTER — Encounter (HOSPITAL_COMMUNITY): Payer: Self-pay

## 2019-05-23 ENCOUNTER — Encounter (HOSPITAL_COMMUNITY)
Admission: RE | Admit: 2019-05-23 | Discharge: 2019-05-23 | Disposition: A | Discharge status: Home / Self Care | Payer: Medicare HMO | Source: Ambulatory Visit | Attending: Surgery | Admitting: Surgery

## 2019-05-23 DIAGNOSIS — Z01818 Encounter for other preprocedural examination: Secondary | ICD-10-CM | POA: Insufficient documentation

## 2019-05-23 HISTORY — DX: Hyperparathyroidism, unspecified: E21.3

## 2019-05-23 HISTORY — DX: Anemia, unspecified: D64.9

## 2019-05-23 HISTORY — DX: Cerebral infarction, unspecified: I63.9

## 2019-05-25 ENCOUNTER — Other Ambulatory Visit: Payer: Self-pay

## 2019-05-25 ENCOUNTER — Encounter (HOSPITAL_COMMUNITY)
Admission: RE | Admit: 2019-05-25 | Discharge: 2019-05-25 | Disposition: A | Discharge status: Home / Self Care | Payer: Medicare HMO | Source: Ambulatory Visit | Attending: Surgery | Admitting: Surgery

## 2019-05-25 ENCOUNTER — Ambulatory Visit (HOSPITAL_COMMUNITY)
Admission: RE | Admit: 2019-05-25 | Discharge: 2019-05-25 | Disposition: A | Discharge status: Home / Self Care | Payer: Medicare HMO | Source: Ambulatory Visit | Attending: Surgery | Admitting: Surgery

## 2019-05-25 ENCOUNTER — Other Ambulatory Visit (HOSPITAL_COMMUNITY)
Admission: RE | Admit: 2019-05-25 | Discharge: 2019-05-25 | Disposition: A | Discharge status: Home / Self Care | Payer: Medicare HMO | Source: Ambulatory Visit | Attending: Surgery | Admitting: Surgery

## 2019-05-25 DIAGNOSIS — K449 Diaphragmatic hernia without obstruction or gangrene: Secondary | ICD-10-CM | POA: Diagnosis not present

## 2019-05-25 DIAGNOSIS — I10 Essential (primary) hypertension: Secondary | ICD-10-CM | POA: Diagnosis not present

## 2019-05-25 DIAGNOSIS — Z20822 Contact with and (suspected) exposure to covid-19: Secondary | ICD-10-CM | POA: Diagnosis not present

## 2019-05-25 DIAGNOSIS — Z01818 Encounter for other preprocedural examination: Secondary | ICD-10-CM | POA: Diagnosis not present

## 2019-05-25 LAB — BASIC METABOLIC PANEL
Anion gap: 12 (ref 5–15)
BUN: 23 mg/dL (ref 8–23)
CO2: 27 mmol/L (ref 22–32)
Calcium: 10 mg/dL (ref 8.9–10.3)
Chloride: 100 mmol/L (ref 98–111)
Creatinine, Ser: 1.28 mg/dL — ABNORMAL HIGH (ref 0.44–1.00)
GFR calc Af Amer: 50 mL/min — ABNORMAL LOW (ref >60)
GFR calc non Af Amer: 43 mL/min — ABNORMAL LOW (ref >60)
Glucose, Bld: 102 mg/dL — ABNORMAL HIGH (ref 70–99)
Potassium: 3.9 mmol/L (ref 3.5–5.1)
Sodium: 139 mmol/L (ref 135–145)

## 2019-05-25 LAB — CBC
HCT: 39.3 % (ref 36.0–46.0)
Hemoglobin: 12.4 g/dL (ref 12.0–15.0)
MCH: 29 pg (ref 26.0–34.0)
MCHC: 31.6 g/dL (ref 30.0–36.0)
MCV: 91.8 fL (ref 80.0–100.0)
Platelets: 539 10*3/uL — ABNORMAL HIGH (ref 150–400)
RBC: 4.28 MIL/uL (ref 3.87–5.11)
RDW: 12.5 % (ref 11.5–15.5)
WBC: 10.9 10*3/uL — ABNORMAL HIGH (ref 4.0–10.5)
nRBC: 0 % (ref 0.0–0.2)

## 2019-05-25 NOTE — Progress Notes (Addendum)
Per Fritz Pickerel documented glucose of 51 on patient was in error.   Jennifer Bass,NT entered ekg and glucose reading in error on this patient.  EKG department was called and EKG to be removed from MUSE by EKG department .  The EKG to be removed has a date and time stamp of 05/25/2019 14:18:41.   The glucose reading of 51 on 5/21/2 has  order number of [id number].  Lattie Haw in [place] stated she had placed both the error glucose reading and the EKG error on the same ticket for correction.  Roanna Banning was made aware of this incident.

## 2019-05-26 LAB — SARS CORONAVIRUS 2 (TAT 6-24 HRS): SARS Coronavirus 2: NEGATIVE

## 2019-05-28 NOTE — Anesthesia Preprocedure Evaluation (Addendum)
Anesthesia Evaluation  Patient identified by MRN, date of birth, ID band Patient awake    Reviewed: Allergy & Precautions, NPO status , Patient's Chart, lab work & pertinent test results  Airway Mallampati: I       Dental no notable dental hx. (+) Teeth Intact   Pulmonary    Pulmonary exam normal breath sounds clear to auscultation       Cardiovascular hypertension, Pt. on medications  Rhythm:Regular Rate:Normal     Neuro/Psych PSYCHIATRIC DISORDERS Anxiety    GI/Hepatic Neg liver ROS, GERD  Medicated,  Endo/Other  negative endocrine ROS  Renal/GU negative Renal ROS  negative genitourinary   Musculoskeletal   Abdominal Normal abdominal exam  (+)   Peds  Hematology   Anesthesia Other Findings   Reproductive/Obstetrics                            Anesthesia Physical Anesthesia Plan  ASA: II  Anesthesia Plan: General   Post-op Pain Management:    Induction: Intravenous  PONV Risk Score and Plan:   Airway Management Planned: Oral ETT  Additional Equipment: None  Intra-op Plan:   Post-operative Plan: Extubation in OR  Informed Consent: I have reviewed the patients History and Physical, chart, labs and discussed the procedure including the risks, benefits and alternatives for the proposed anesthesia with the patient or authorized representative who has indicated his/her understanding and acceptance.     Dental advisory given  Plan Discussed with: CRNA  Anesthesia Plan Comments:        Anesthesia Quick Evaluation

## 2019-05-28 NOTE — Progress Notes (Signed)
Vania Rea called form Epic and stated she was removing the EKG done in error of time stamp of 05/25/19 14:18:41 and capillary glucose of 51 entered in error on patient.  Noted in epic that both have been removed and called Short Stay and informed Gaynelle Cage , RN that EKG entered in error on patient and glucose entered in error on patient on 05/25/19 have been removed from epic by Epic so that she could take progress note off of front of chart.

## 2019-05-28 NOTE — Progress Notes (Signed)
Issue with ekg in error on 05/25/19 and glucose error on 05/25/19 is currently being worked on to be removed by [employer].  Spoke with Gaynelle Cage in Short Stay and she will print off progress note to make sure nurse and mds are aware that these items are in error under this patient's name.

## 2019-05-29 ENCOUNTER — Encounter (HOSPITAL_COMMUNITY): Payer: Self-pay | Admitting: Surgery

## 2019-05-29 ENCOUNTER — Encounter (HOSPITAL_COMMUNITY)
Admission: RE | Disposition: A | Discharge status: Home / Self Care | Payer: Self-pay | Source: Home / Self Care | Attending: Surgery

## 2019-05-29 ENCOUNTER — Ambulatory Visit (HOSPITAL_COMMUNITY)
Admission: RE | Admit: 2019-05-29 | Discharge: 2019-05-30 | Disposition: A | Discharge status: Home / Self Care | Payer: Medicare HMO | Attending: Surgery | Admitting: Surgery

## 2019-05-29 ENCOUNTER — Inpatient Hospital Stay (HOSPITAL_COMMUNITY): Payer: Medicare HMO | Admitting: Anesthesiology

## 2019-05-29 DIAGNOSIS — E042 Nontoxic multinodular goiter: Secondary | ICD-10-CM | POA: Diagnosis not present

## 2019-05-29 DIAGNOSIS — Z880 Allergy status to penicillin: Secondary | ICD-10-CM | POA: Diagnosis not present

## 2019-05-29 DIAGNOSIS — Z79899 Other long term (current) drug therapy: Secondary | ICD-10-CM | POA: Insufficient documentation

## 2019-05-29 DIAGNOSIS — C750 Malignant neoplasm of parathyroid gland: Secondary | ICD-10-CM | POA: Diagnosis not present

## 2019-05-29 DIAGNOSIS — Z8349 Family history of other endocrine, nutritional and metabolic diseases: Secondary | ICD-10-CM | POA: Insufficient documentation

## 2019-05-29 DIAGNOSIS — M8580 Other specified disorders of bone density and structure, unspecified site: Secondary | ICD-10-CM | POA: Insufficient documentation

## 2019-05-29 DIAGNOSIS — Z791 Long term (current) use of non-steroidal anti-inflammatories (NSAID): Secondary | ICD-10-CM | POA: Insufficient documentation

## 2019-05-29 DIAGNOSIS — K219 Gastro-esophageal reflux disease without esophagitis: Secondary | ICD-10-CM | POA: Diagnosis not present

## 2019-05-29 DIAGNOSIS — C73 Malignant neoplasm of thyroid gland: Secondary | ICD-10-CM | POA: Diagnosis not present

## 2019-05-29 DIAGNOSIS — D351 Benign neoplasm of parathyroid gland: Secondary | ICD-10-CM | POA: Diagnosis not present

## 2019-05-29 DIAGNOSIS — E041 Nontoxic single thyroid nodule: Secondary | ICD-10-CM | POA: Diagnosis not present

## 2019-05-29 DIAGNOSIS — Z885 Allergy status to narcotic agent status: Secondary | ICD-10-CM | POA: Diagnosis not present

## 2019-05-29 DIAGNOSIS — E210 Primary hyperparathyroidism: Secondary | ICD-10-CM | POA: Diagnosis not present

## 2019-05-29 DIAGNOSIS — M1990 Unspecified osteoarthritis, unspecified site: Secondary | ICD-10-CM | POA: Diagnosis not present

## 2019-05-29 DIAGNOSIS — I10 Essential (primary) hypertension: Secondary | ICD-10-CM | POA: Diagnosis not present

## 2019-05-29 DIAGNOSIS — E785 Hyperlipidemia, unspecified: Secondary | ICD-10-CM | POA: Diagnosis not present

## 2019-05-29 HISTORY — PX: THYROID LOBECTOMY: SHX420

## 2019-05-29 HISTORY — PX: PARATHYROIDECTOMY: SHX19

## 2019-05-29 LAB — SURGICAL PATHOLOGY

## 2019-05-29 SURGERY — PARATHYROIDECTOMY
Anesthesia: General | Laterality: Right

## 2019-05-29 MED ORDER — KETOROLAC TROMETHAMINE 30 MG/ML IJ SOLN
INTRAMUSCULAR | Status: AC
  Filled 2019-05-29: qty 1

## 2019-05-29 MED ORDER — PROPOFOL 10 MG/ML IV BOLUS
INTRAVENOUS | Status: DC | PRN
  Administered 2019-05-29: 10 mg via INTRAVENOUS
  Administered 2019-05-29: 140 mg via INTRAVENOUS
  Administered 2019-05-29: 20 mg via INTRAVENOUS

## 2019-05-29 MED ORDER — MIDAZOLAM HCL 2 MG/2ML IJ SOLN
INTRAMUSCULAR | Status: AC
  Filled 2019-05-29: qty 2

## 2019-05-29 MED ORDER — ROCURONIUM BROMIDE 10 MG/ML (PF) SYRINGE
PREFILLED_SYRINGE | INTRAVENOUS | Status: AC
  Filled 2019-05-29: qty 10

## 2019-05-29 MED ORDER — DEXAMETHASONE SODIUM PHOSPHATE 10 MG/ML IJ SOLN
INTRAMUSCULAR | Status: DC | PRN
  Administered 2019-05-29: 8 mg via INTRAVENOUS

## 2019-05-29 MED ORDER — LACTATED RINGERS IV SOLN: INTRAVENOUS | Status: DC

## 2019-05-29 MED ORDER — ROCURONIUM BROMIDE 100 MG/10ML IV SOLN
INTRAVENOUS | Status: DC | PRN
  Administered 2019-05-29: 60 mg via INTRAVENOUS

## 2019-05-29 MED ORDER — PROMETHAZINE HCL 25 MG/ML IJ SOLN: 6.2500 mg | INTRAMUSCULAR | Status: DC | PRN

## 2019-05-29 MED ORDER — PROPOFOL 10 MG/ML IV BOLUS
INTRAVENOUS | Status: AC
  Filled 2019-05-29: qty 20

## 2019-05-29 MED ORDER — PANTOPRAZOLE SODIUM 40 MG PO TBEC
40.0000 mg | DELAYED_RELEASE_TABLET | Freq: Every day | ORAL | Status: DC
  Administered 2019-05-29: 40 mg via ORAL
  Filled 2019-05-29: qty 1

## 2019-05-29 MED ORDER — FENTANYL CITRATE (PF) 250 MCG/5ML IJ SOLN
INTRAMUSCULAR | Status: AC
  Filled 2019-05-29: qty 5

## 2019-05-29 MED ORDER — LIDOCAINE 2% (20 MG/ML) 5 ML SYRINGE
INTRAMUSCULAR | Status: AC
  Filled 2019-05-29: qty 5

## 2019-05-29 MED ORDER — CHLORHEXIDINE GLUCONATE CLOTH 2 % EX PADS: 6.0000 | MEDICATED_PAD | Freq: Once | CUTANEOUS | Status: DC

## 2019-05-29 MED ORDER — 0.9 % SODIUM CHLORIDE (POUR BTL) OPTIME
TOPICAL | Status: DC | PRN
  Administered 2019-05-29: 1000 mL

## 2019-05-29 MED ORDER — ONDANSETRON HCL 4 MG/2ML IJ SOLN
INTRAMUSCULAR | Status: AC
  Filled 2019-05-29: qty 2

## 2019-05-29 MED ORDER — ESMOLOL HCL 100 MG/10ML IV SOLN
INTRAVENOUS | Status: DC | PRN
  Administered 2019-05-29: 20 mg via INTRAVENOUS

## 2019-05-29 MED ORDER — MEPERIDINE HCL 50 MG/ML IJ SOLN: 6.2500 mg | INTRAMUSCULAR | Status: DC | PRN

## 2019-05-29 MED ORDER — ACETAMINOPHEN 650 MG RE SUPP: 650.0000 mg | Freq: Four times a day (QID) | RECTAL | Status: DC | PRN

## 2019-05-29 MED ORDER — PHENYLEPHRINE HCL (PRESSORS) 10 MG/ML IV SOLN
INTRAVENOUS | Status: DC | PRN
  Administered 2019-05-29 (×4): 80 ug via INTRAVENOUS
  Administered 2019-05-29: 60 ug via INTRAVENOUS

## 2019-05-29 MED ORDER — FLUTICASONE PROPIONATE 50 MCG/ACT NA SUSP
2.0000 | Freq: Every day | NASAL | Status: DC
  Filled 2019-05-29: qty 16

## 2019-05-29 MED ORDER — ALUM & MAG HYDROXIDE-SIMETH 200-200-20 MG/5ML PO SUSP
30.0000 mL | Freq: Four times a day (QID) | ORAL | Status: DC | PRN
  Administered 2019-05-29: 30 mL via ORAL
  Filled 2019-05-29: qty 30

## 2019-05-29 MED ORDER — MIDAZOLAM HCL 5 MG/5ML IJ SOLN
INTRAMUSCULAR | Status: DC | PRN
  Administered 2019-05-29 (×2): 1 mg via INTRAVENOUS

## 2019-05-29 MED ORDER — AMLODIPINE BESYLATE 5 MG PO TABS: 5.0000 mg | ORAL_TABLET | Freq: Every day | ORAL | Status: DC

## 2019-05-29 MED ORDER — HYDROMORPHONE HCL 1 MG/ML IJ SOLN
0.2500 mg | INTRAMUSCULAR | Status: DC | PRN
  Administered 2019-05-29 (×3): 0.25 mg via INTRAVENOUS

## 2019-05-29 MED ORDER — HYDROMORPHONE HCL 1 MG/ML IJ SOLN: 1.0000 mg | INTRAMUSCULAR | Status: DC | PRN

## 2019-05-29 MED ORDER — BUPIVACAINE HCL 0.25 % IJ SOLN
INTRAMUSCULAR | Status: DC | PRN
  Administered 2019-05-29: 10 mL

## 2019-05-29 MED ORDER — DULOXETINE HCL 60 MG PO CPEP: 60.0000 mg | ORAL_CAPSULE | Freq: Every day | ORAL | Status: DC

## 2019-05-29 MED ORDER — ONDANSETRON HCL 4 MG/2ML IJ SOLN: 4.0000 mg | Freq: Four times a day (QID) | INTRAMUSCULAR | Status: DC | PRN

## 2019-05-29 MED ORDER — ONDANSETRON 4 MG PO TBDP: 4.0000 mg | ORAL_TABLET | Freq: Four times a day (QID) | ORAL | Status: DC | PRN

## 2019-05-29 MED ORDER — SODIUM CHLORIDE 0.45 % IV SOLN: INTRAVENOUS | Status: DC

## 2019-05-29 MED ORDER — CHLORHEXIDINE GLUCONATE 0.12 % MT SOLN
15.0000 mL | Freq: Once | OROMUCOSAL | Status: AC
  Administered 2019-05-29: 15 mL via OROMUCOSAL

## 2019-05-29 MED ORDER — ACETAMINOPHEN 325 MG PO TABS: 650.0000 mg | ORAL_TABLET | Freq: Four times a day (QID) | ORAL | Status: DC | PRN

## 2019-05-29 MED ORDER — FUROSEMIDE 20 MG PO TABS
20.0000 mg | ORAL_TABLET | Freq: Every day | ORAL | Status: DC
  Administered 2019-05-29: 20 mg via ORAL
  Filled 2019-05-29: qty 1

## 2019-05-29 MED ORDER — TRAMADOL HCL 50 MG PO TABS
50.0000 mg | ORAL_TABLET | Freq: Four times a day (QID) | ORAL | Status: DC | PRN
  Administered 2019-05-29 (×2): 50 mg via ORAL
  Filled 2019-05-29 (×2): qty 1

## 2019-05-29 MED ORDER — OXYCODONE HCL 5 MG PO TABS
5.0000 mg | ORAL_TABLET | ORAL | Status: DC | PRN
  Administered 2019-05-29 – 2019-05-30 (×2): 5 mg via ORAL
  Filled 2019-05-29 (×2): qty 1

## 2019-05-29 MED ORDER — ESMOLOL HCL 100 MG/10ML IV SOLN
INTRAVENOUS | Status: AC
  Filled 2019-05-29: qty 10

## 2019-05-29 MED ORDER — MELOXICAM 15 MG PO TABS
15.0000 mg | ORAL_TABLET | Freq: Every day | ORAL | Status: DC
  Administered 2019-05-29: 15 mg via ORAL
  Filled 2019-05-29 (×2): qty 1

## 2019-05-29 MED ORDER — ORAL CARE MOUTH RINSE: 15.0000 mL | Freq: Once | OROMUCOSAL | Status: AC

## 2019-05-29 MED ORDER — LIDOCAINE HCL (CARDIAC) PF 100 MG/5ML IV SOSY
PREFILLED_SYRINGE | INTRAVENOUS | Status: DC | PRN
  Administered 2019-05-29: 50 mg via INTRAVENOUS

## 2019-05-29 MED ORDER — DEXAMETHASONE SODIUM PHOSPHATE 10 MG/ML IJ SOLN
INTRAMUSCULAR | Status: AC
  Filled 2019-05-29: qty 1

## 2019-05-29 MED ORDER — CEFAZOLIN SODIUM-DEXTROSE 2-4 GM/100ML-% IV SOLN
2.0000 g | INTRAVENOUS | Status: AC
  Administered 2019-05-29: 2 g via INTRAVENOUS

## 2019-05-29 MED ORDER — KETOROLAC TROMETHAMINE 15 MG/ML IJ SOLN
15.0000 mg | Freq: Once | INTRAMUSCULAR | Status: AC
  Administered 2019-05-29: 15 mg via INTRAVENOUS

## 2019-05-29 MED ORDER — FENTANYL CITRATE (PF) 100 MCG/2ML IJ SOLN
INTRAMUSCULAR | Status: DC | PRN
  Administered 2019-05-29 (×2): 25 ug via INTRAVENOUS
  Administered 2019-05-29 (×2): 50 ug via INTRAVENOUS
  Administered 2019-05-29: 25 ug via INTRAVENOUS
  Administered 2019-05-29: 50 ug via INTRAVENOUS

## 2019-05-29 MED ORDER — HYDROMORPHONE HCL 1 MG/ML IJ SOLN
INTRAMUSCULAR | Status: AC
  Filled 2019-05-29: qty 1

## 2019-05-29 MED ORDER — SUGAMMADEX SODIUM 200 MG/2ML IV SOLN
INTRAVENOUS | Status: DC | PRN
  Administered 2019-05-29: 200 mg via INTRAVENOUS

## 2019-05-29 MED ORDER — CEFAZOLIN SODIUM-DEXTROSE 2-4 GM/100ML-% IV SOLN
INTRAVENOUS | Status: AC
  Filled 2019-05-29: qty 100

## 2019-05-29 MED ORDER — BUPIVACAINE HCL 0.25 % IJ SOLN
INTRAMUSCULAR | Status: AC
  Filled 2019-05-29: qty 1

## 2019-05-29 MED ORDER — ONDANSETRON HCL 4 MG/2ML IJ SOLN
INTRAMUSCULAR | Status: DC | PRN
  Administered 2019-05-29: 4 mg via INTRAVENOUS

## 2019-05-29 SURGICAL SUPPLY — 34 items
ADH SKN CLS APL DERMABOND .7 (GAUZE/BANDAGES/DRESSINGS) ×1
APL PRP STRL LF DISP 70% ISPRP (MISCELLANEOUS) ×1
ATTRACTOMAT 16X20 MAGNETIC DRP (DRAPES) ×2 IMPLANT
BLADE SURG 15 STRL LF DISP TIS (BLADE) ×1 IMPLANT
BLADE SURG 15 STRL SS (BLADE) ×2
CHLORAPREP W/TINT 26 (MISCELLANEOUS) ×2 IMPLANT
CLIP VESOCCLUDE MED 6/CT (CLIP) ×5 IMPLANT
CLIP VESOCCLUDE SM WIDE 6/CT (CLIP) ×5 IMPLANT
COVER SURGICAL LIGHT HANDLE (MISCELLANEOUS) ×2 IMPLANT
COVER WAND RF STERILE (DRAPES) ×2 IMPLANT
DERMABOND ADVANCED (GAUZE/BANDAGES/DRESSINGS) ×1
DERMABOND ADVANCED .7 DNX12 (GAUZE/BANDAGES/DRESSINGS) ×1 IMPLANT
DRAPE LAPAROTOMY T 98X78 PEDS (DRAPES) ×2 IMPLANT
ELECT PENCIL ROCKER SW 15FT (MISCELLANEOUS) ×2 IMPLANT
ELECT REM PT RETURN 15FT ADLT (MISCELLANEOUS) ×2 IMPLANT
GAUZE 4X4 16PLY RFD (DISPOSABLE) ×2 IMPLANT
GLOVE SURG ORTHO 8.0 STRL STRW (GLOVE) ×2 IMPLANT
GOWN STRL REUS W/TWL XL LVL3 (GOWN DISPOSABLE) ×6 IMPLANT
HEMOSTAT SURGICEL 2X4 FIBR (HEMOSTASIS) ×2 IMPLANT
ILLUMINATOR WAVEGUIDE N/F (MISCELLANEOUS) ×2 IMPLANT
KIT BASIN (CUSTOM PROCEDURE TRAY) ×2 IMPLANT
KIT TURNOVER KIT A (KITS) IMPLANT
NDL HYPO 25X1 1.5 SAFETY (NEEDLE) ×1 IMPLANT
NEEDLE HYPO 25X1 1.5 SAFETY (NEEDLE) ×2 IMPLANT
PACK BASIC VI WITH GOWN DISP (CUSTOM PROCEDURE TRAY) ×2 IMPLANT
PENCIL SMOKE EVACUATOR (MISCELLANEOUS) ×2 IMPLANT
SHEARS HARMONIC 9CM CVD (BLADE) ×2 IMPLANT
SUT MNCRL AB 4-0 PS2 18 (SUTURE) ×2 IMPLANT
SUT VIC AB 3-0 SH 18 (SUTURE) ×4 IMPLANT
SYR BULB IRRIG 60ML STRL (SYRINGE) ×2 IMPLANT
SYR CONTROL 10ML LL (SYRINGE) ×2 IMPLANT
TOWEL OR 17X26 10 PK STRL BLUE (TOWEL DISPOSABLE) ×2 IMPLANT
TOWEL OR NON WOVEN STRL DISP B (DISPOSABLE) ×2 IMPLANT
TUBING CONNECTING 10 (TUBING) ×2 IMPLANT

## 2019-05-29 NOTE — Op Note (Signed)
Procedure Note  Pre-operative Diagnosis:  Primary hyperparathyroidism, thyroid nodules  Post-operative Diagnosis:  same  Surgeon:  Armandina Gemma, MD  Assistant:  none   Procedure:  1. right thyroid lobectomy and isthmusectomy; 2. 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  The thyroid lobe itself was abnormal, enlarged, and multinodular.  Anesthesia:  General  Estimated Blood Loss:  minimal  Drains: none         Specimen: thyroid lobe to pathology  Indications:  Patient is referred by her endocrinologist, Dr. Philemon Kingdom, for surgical evaluation and management of primary hyperparathyroidism. Patient was noted on routine laboratory studies to have elevated serum calcium levels. These have recently ranged from 10.4-11.0. Intact PTH levels are within the upper range of normal, ranging from 41-54. They have never been appropriately suppressed. 24-hour urine collection for calcium was normal at 74. Patient subsequently underwent a nuclear medicine parathyroid scan on May 03, 2019. This demonstrated evidence of a right inferior parathyroid adenoma. There was additional tracer in the superior pole of the right thyroid lobe and a right superior parathyroid adenoma could not be excluded. Patient subsequently underwent an ultrasound examination on May 11, 2019. This demonstrated a heterogeneous thyroid. However there was a 2.2 cm mid right thyroid nodule which corresponded with the signal seen on nuclear medicine scanning. Biopsy was not recommended. Patient presents today for parathyroidectomy.  Procedure Details: Procedure was done in OR #4 at the [facility]. The patient was brought to the operating room and placed in a supine position on the operating room table. Following administration of general anesthesia, the patient  was positioned and then prepped and draped in the usual aseptic fashion. After ascertaining that an adequate level of anesthesia had been achieved, an incision was made with #15 blade. Dissection was carried through subcutaneous tissues and platysma. Hemostasis was achieved with the electrocautery. Skin flaps were elevated cephalad and caudad and a Weitlander retractor is placed for exposure. Strap muscles were incised in the midline and dissection was begun on the right side. Strap muscles were reflected laterally. The right thyroid lobe was enlarged and multinodular. Exploration revealed no evidence of parathyroid adenoma in either the superior or inferior locations.  Review of the sestamibi scan demonstrated uptake in both the superior pole and inferior pole of the thryoid gland.  Therefore a decision was made to proceed with thyroid lobectomy. The lobe was gently mobilized with blunt dissection. Superior pole vessels were dissected out and divided individually between small and medium ligaclips with the harmonic scalpel. The thyroid lobe was rolled anteriorly. Branches of the inferior thyroid artery were divided between small ligaclips with the harmonic scalpel. Inferior venous tributaries were divided between ligaclips. The recurrent laryngeal nerve was identified and preserved along its course. The ligament of Gwenlyn Found was released with the electrocautery and the gland was mobilized onto the anterior trachea. Isthmus was mobilized across the midline. There was no significant pyramidal lobe present. The thyroid parenchyma was transected at the junction of the isthmus and contralateral thyroid lobe with the harmonic scalpel.  The thyroid lobe was closely examined and sectioned with a knife on the back table.  There is a rounded 1.3 cm nodule in the superior pole of the thyroid gland consistent with intrathyroidal parathyroid adenoma.  This is marked with a single suture and submitted to pathology for frozen  section.  Likewise a second nodule was  identified in the inferior portion of the right thyroid lobe and was marked with a double suture and submitted to pathology for review.  Frozen section biopsies confirmed parathyroid tissue in the superior nodule most likely representing an adenoma.  The inferior nodule proved to be thyroid tissue.  Further exploration of the right neck included opening of the carotid sheath.  Within the carotid sheath just inferior to the thyroid bed an enlarged parathyroid gland is identified.  This is gently dissected out.  Vascular pedicles divided between ligaclips with the harmonic scalpel and the entire gland is excised.  This is submitted for frozen section and parathyroid tissue is confirmed which was consistent with parathyroid adenoma.  The neck was irrigated with warm saline. Fibrillar was placed throughout the operative field. Strap muscles were approximated in the midline with interrupted 3-0 Vicryl sutures. Platysma was closed with interrupted 3-0 Vicryl sutures. Skin was closed with a running 4-0 Monocryl subcuticular suture.  Wound was washed and dried and Dermabond was applied. The patient was awakened from anesthesia and brought to the recovery room. The patient tolerated the procedure well.   Armandina Gemma, MD [facility] Office: [phone]

## 2019-05-29 NOTE — Transfer of Care (Signed)
Immediate Anesthesia Transfer of Care Note  Patient: Maria Moss  Procedure(s) Performed: RIGHT INFERIOR PARATHYROIDECTOMY (Right ) RIGHT THYROID LOBECTOMY (Right )  Patient Location: PACU  Anesthesia Type:General  Level of Consciousness: oriented, drowsy and patient cooperative  Airway & Oxygen Therapy: Patient Spontanous Breathing and Patient connected to face mask oxygen  Post-op Assessment: Report given to RN and Post -op Vital signs reviewed and stable  Post vital signs: Reviewed and stable  Last Vitals:  Vitals Value Taken Time  BP 140/74 05/29/19 0924  Temp    Pulse 91 05/29/19 0926  Resp 15 05/29/19 0926  SpO2 99 % 05/29/19 0926  Vitals shown include unvalidated device data.  Last Pain:  Vitals:   05/29/19 0548  TempSrc: Oral         Complications: No apparent anesthesia complications

## 2019-05-29 NOTE — Interval H&P Note (Signed)
History and Physical Interval Note:  05/29/2019 6:59 AM  Maria Moss  has presented today for surgery, with the diagnosis of PRIMARY HYPERPARATHYROIDISM THYROID NODULE.  The various methods of treatment have been discussed with the patient and family. After consideration of risks, benefits and other options for treatment, the patient has consented to    Procedure(s): RIGHT PARATHYROIDECTOMY (Right) POSSIBLE RIGHT THYROID LOBECTOMY (Right) as a surgical intervention.    The patient's history has been reviewed, patient examined, no change in status, stable for surgery.  I have reviewed the patient's chart and labs.  Questions were answered to the patient's satisfaction.    Armandina Gemma, MD [facility] Office: [place]

## 2019-05-29 NOTE — Anesthesia Procedure Notes (Signed)
Procedure Name: Intubation Date/Time: 05/29/2019 7:36 AM Performed by: Garrel Ridgel, CRNA Pre-anesthesia Checklist: Patient identified, Emergency Drugs available, Suction available and Patient being monitored Patient Re-evaluated:Patient Re-evaluated prior to induction Oxygen Delivery Method: Circle system utilized Preoxygenation: Pre-oxygenation with 100% oxygen Induction Type: IV induction Ventilation: Mask ventilation without difficulty Laryngoscope Size: Mac and 3 Grade View: Grade II Tube type: Oral Tube size: 7.0 mm Number of attempts: 1 Airway Equipment and Method: Stylet and Oral airway Placement Confirmation: ETT inserted through vocal cords under direct vision,  positive ETCO2 and breath sounds checked- equal and bilateral Secured at: 21 cm Tube secured with: Tape Dental Injury: Teeth and Oropharynx as per pre-operative assessment

## 2019-05-29 NOTE — H&P (Signed)
General Surgery [facility]  Maria Moss DOB: December 02, 1950 Married / Language: English / Race: White Female   History of Present Illness  The patient is a 69 year old female who presents with primary hyperparathyroidism.  CHIEF COMPLAINT: primary hyperparathyroidism  Patient is referred by her endocrinologist, Dr. Philemon Kingdom, for surgical evaluation and management of primary hyperparathyroidism. Patient was noted on routine laboratory studies to have elevated serum calcium levels. These have recently ranged from 10.4-11.0. Intact PTH levels are within the upper range of normal, ranging from 41-54. They have never been appropriately suppressed. 24-hour urine collection for calcium was normal at 74. Patient subsequently underwent a nuclear medicine parathyroid scan on May 03, 2019. This demonstrated evidence of a right inferior parathyroid adenoma. There was additional tracer in the superior pole of the right thyroid lobe and a right superior parathyroid adenoma could not be excluded. Patient subsequently underwent an ultrasound examination on May 11, 2019. This demonstrated a heterogeneous thyroid. However there was a 2.2 cm mid right thyroid nodule which corresponded with the signal seen on nuclear medicine scanning. Biopsy was not recommended. Patient has had no prior head or neck surgery. She has never been on thyroid medication. She does have complications related to hypercalcemia including osteopenia and chronic fatigue. She has no history of nephrolithiasis. Of note the patient underwent a right adrenalectomy approximately 1 year ago by my partner, Dr. Rosendo Gros. Reportedly the final pathology results were benign. Patient presents today to discuss parathyroidectomy.   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 Furosemide (20MG  Tablet, Oral) Active. ZyrTEC Allergy (10MG  Tablet, Oral) Active. Omega 3 (1000MG  Capsule, Oral) Active. Vitamin D (1000UNIT Tablet, Oral) Active. Ginseng (50MG  Capsule, Oral) Active. Vitamin B-12 (1000MCG Tablet, Oral) Active. Probiotic (250MG  Capsule, Oral) Active. Meloxicam (15MG  Tablet, Oral) Active. Medications Reconciled traMADol HCl (50MG  Tablet, Oral) Active. amLODIPine Besylate (5MG  Tablet, Oral) Active. Enalapril Maleate (20MG  Tablet, Oral) Active. Pantoprazole Sodium (40MG  Tablet DR, Oral) Active.  Social History  Caffeine use  Carbonated beverages, Tea.  Family History  Alcohol Abuse  Brother. Arthritis  Brother, Mother, Sister. Depression  Brother, Mother. Heart Disease  Mother. Hypertension  Mother, Son. Kidney Disease  Brother. Migraine Headache  Daughter. Ovarian Cancer  Mother. Respiratory Condition  Mother, Sister. Thyroid problems  Mother.  Pregnancy / Birth History  Age at menarche  89 years. Age of menopause  10-55 Gravida  2 Maternal age  67-25  Other Problems  Arthritis  Back Pain  Bladder Problems  Gastroesophageal Reflux Disease  High blood pressure  Hypercholesterolemia   Vitals  Weight: 150.2 lb Height: 61in Body Surface Area: 1.67 m Body Mass Index: 28.38 kg/m  Temp.: 98.76F (Thermal Scan)  Pulse: 80 (Regular)  BP: 136/82(Sitting, Left Arm, Standard)  Physical Exam   GENERAL APPEARANCE Development: normal Nutritional status: normal Gross deformities: none  SKIN Rash, lesions, ulcers: none Induration, erythema: none Nodules: none palpable  EYES Conjunctiva and lids: normal Pupils: equal and reactive Iris: normal  bilaterally  EARS, NOSE, MOUTH, THROAT External ears: no lesion or deformity External nose: no lesion or deformity Hearing: grossly normal Due to Covid-19 pandemic, patient is  wearing a mask.  NECK Symmetric: no Trachea: midline Thyroid: Palpation of the right thyroid lobe shows a soft nodular mass in the midportion of the right lobe measuring approximately 2 cm in size. This is mobile and nontender. Palpation of the left lobe shows no significant nodularity.  CHEST Respiratory effort: normal Retraction or accessory muscle use: no Breath sounds: normal bilaterally Rales, rhonchi, wheeze: none  CARDIOVASCULAR Auscultation: regular rhythm, normal rate Murmurs: none Pulses: radial pulse 2+ palpable Lower extremity edema: mild bilateral  MUSCULOSKELETAL Station and gait: normal Digits and nails: no clubbing or cyanosis Muscle strength: grossly normal all extremities Range of motion: grossly normal all extremities Deformity: none  LYMPHATIC Cervical: none palpable Supraclavicular: none palpable  PSYCHIATRIC Oriented to person, place, and time: yes Mood and affect: normal for situation Judgment and insight: appropriate for situation    Assessment & Plan   PRIMARY HYPERPARATHYROIDISM (E21.0) RIGHT THYROID NODULE (E04.1)  Patient is referred by her endocrinologist for surgical evaluation and management of primary hyperparathyroidism.  Patient provided with a copy of "Parathyroid Surgery: Treatment for Your Parathyroid Gland Problem", published by [employer], 12 pages. Book reviewed and explained to patient during visit today.  Patient has biochemical evidence of primary hyperparathyroidism including complications of chronic fatigue and bone loss. Diagnostic studies demonstrate a right inferior parathyroid adenoma and possibly a second gland adenoma in the right superior position. Also there is a dominant nodule in the mid right thyroid lobe. We discussed exploration and  parathyroidectomy. We discussed the possible need for right thyroid lobectomy. We discussed doing this as either an outpatient procedure or possibly with an overnight hospital stay. We discussed risk and benefits of the surgery including the risk of recurrent laryngeal nerve injury causing permanent hoarseness. We discussed the size and location of her surgical incision. The patient understands and wishes to proceed with surgery in the near future.  The risks and benefits of the procedure have been discussed at length with the patient. The patient understands the proposed procedure, potential alternative treatments, and the course of recovery to be expected. All of the patient's questions have been answered at this time. The patient wishes to proceed with surgery.  Armandina Gemma, MD [facility] Office: [phone]

## 2019-05-30 ENCOUNTER — Other Ambulatory Visit: Payer: Self-pay

## 2019-05-30 ENCOUNTER — Encounter: Payer: Self-pay | Admitting: *Deleted

## 2019-05-30 DIAGNOSIS — M1990 Unspecified osteoarthritis, unspecified site: Secondary | ICD-10-CM | POA: Diagnosis not present

## 2019-05-30 DIAGNOSIS — E042 Nontoxic multinodular goiter: Secondary | ICD-10-CM | POA: Diagnosis not present

## 2019-05-30 DIAGNOSIS — Z79899 Other long term (current) drug therapy: Secondary | ICD-10-CM | POA: Diagnosis not present

## 2019-05-30 DIAGNOSIS — D351 Benign neoplasm of parathyroid gland: Secondary | ICD-10-CM | POA: Diagnosis not present

## 2019-05-30 DIAGNOSIS — C750 Malignant neoplasm of parathyroid gland: Secondary | ICD-10-CM | POA: Diagnosis not present

## 2019-05-30 DIAGNOSIS — Z791 Long term (current) use of non-steroidal anti-inflammatories (NSAID): Secondary | ICD-10-CM | POA: Diagnosis not present

## 2019-05-30 DIAGNOSIS — E210 Primary hyperparathyroidism: Secondary | ICD-10-CM | POA: Diagnosis not present

## 2019-05-30 DIAGNOSIS — K219 Gastro-esophageal reflux disease without esophagitis: Secondary | ICD-10-CM | POA: Diagnosis not present

## 2019-05-30 DIAGNOSIS — M8580 Other specified disorders of bone density and structure, unspecified site: Secondary | ICD-10-CM | POA: Diagnosis not present

## 2019-05-30 LAB — BASIC METABOLIC PANEL
Anion gap: 9 (ref 5–15)
BUN: 31 mg/dL — ABNORMAL HIGH (ref 8–23)
CO2: 29 mmol/L (ref 22–32)
Calcium: 8.7 mg/dL — ABNORMAL LOW (ref 8.9–10.3)
Chloride: 99 mmol/L (ref 98–111)
Creatinine, Ser: 1.27 mg/dL — ABNORMAL HIGH (ref 0.44–1.00)
GFR calc Af Amer: 51 mL/min — ABNORMAL LOW (ref >60)
GFR calc non Af Amer: 44 mL/min — ABNORMAL LOW (ref >60)
Glucose, Bld: 117 mg/dL — ABNORMAL HIGH (ref 70–99)
Potassium: 4.6 mmol/L (ref 3.5–5.1)
Sodium: 137 mmol/L (ref 135–145)

## 2019-05-30 MED ORDER — OXYCODONE HCL 5 MG PO TABS: 5.0000 mg | ORAL_TABLET | ORAL | 0 refills | Status: DC | PRN

## 2019-05-30 NOTE — Discharge Summary (Signed)
Physician Discharge Summary [facility]  Patient ID: Maria Moss MRN: [id number] DOB/AGE: [birth year] 69 y.o.  Admit date: 05/29/2019 Discharge date: 05/30/2019  Admission Diagnoses:  Primary hyperparathyroidism, thyroid nodules  Discharge Diagnoses:  Principal Problem:   Hyperparathyroidism, primary ([place]) Active Problems:   Right thyroid nodule   Primary hyperparathyroidism [facility])   Discharged Condition: good  Hospital Course: Patient was admitted for observation following thyroid and parathyroid surgery.  Post op course was uncomplicated.  Pain was well controlled.  Tolerated diet.  Post op calcium level on morning following surgery was 8.7 mg/dl.  Patient was prepared for discharge home on POD#1.  Consults: None  Treatments: surgery: right thyroid lobectomy, right inferior parathyroidectomy  Discharge Exam: Blood pressure 125/69, pulse 78, temperature 98.1 F (36.7 C), temperature source Oral, resp. rate 18, SpO2 91 %. HEENT - clear Neck - wound dry and intact; mild STS and ecchymosis; voice normal Chest - clear bilaterally Cor - RRR  Disposition: Home  Discharge Instructions    Diet - low sodium heart healthy   Complete by: As directed    Discharge instructions   Complete by: As directed    [place]  THYROID & PARATHYROID SURGERY:  POST-OP INSTRUCTIONS  Always review your discharge instruction sheet from the facility where your surgery was performed.  A prescription for pain medication may be given to you upon discharge.  Take your pain medication as prescribed.  If narcotic pain medicine is not needed, then you may take acetaminophen (Tylenol) or ibuprofen (Advil) as needed.  Take your usually prescribed medications unless otherwise directed.  If you need a refill on your pain medication, please contact our office during regular business hours.  Prescriptions cannot be processed by our office after 5 pm or on  weekends.  Start with a light diet upon arrival home, such as soup and crackers or toast.  Be sure to drink plenty of fluids daily.  Resume your normal diet the day after surgery.  Most patients will experience some swelling and bruising on the chest and neck area.  Ice packs will help.  Swelling and bruising can take several days to resolve.   It is common to experience some constipation after surgery.  Increasing fluid intake and taking a stool softener (Colace) will usually help or prevent this problem.  A mild laxative (Milk of Magnesia or Miralax) should be taken according to package directions if there has been no bowel movement after 48 hours.  You have steri-strips and a gauze dressing over your incision.  You may remove the gauze bandage on the second day after surgery, and you may shower at that time.  Leave your steri-strips (small skin tapes) in place directly over the incision.  These strips should remain on the skin for 5-7 days and then be removed.  You may get them wet in the shower and pat them dry.  You may resume regular (light) daily activities beginning the next day (such as daily self-care, walking, climbing stairs) gradually increasing activities as tolerated.  You may have sexual intercourse when it is comfortable.  Refrain from any heavy lifting or straining until approved by your doctor.  You may drive when you no longer are taking prescription pain medication, you can comfortably wear a seatbelt, and you can safely maneuver your car and apply brakes.  You should see your doctor in the office for a follow-up appointment approximately three weeks after your surgery.  Make sure  that you call for this appointment within a day or two after you arrive home to insure a convenient appointment time.  WHEN TO CALL YOUR DOCTOR: -- Fever greater than 101.5 -- Inability to urinate -- Nausea and/or vomiting - persistent -- Extreme swelling or bruising -- Continued bleeding from  incision -- Increased pain, redness, or drainage from the incision -- Difficulty swallowing or breathing -- Muscle cramping or spasms -- Numbness or tingling in hands or around lips  The clinic staff is available to answer your questions during regular business hours.  Please don't hesitate to call and ask to speak to one of the nurses if you have concerns.  Armandina Gemma, MD [facility] Office: [phone]   Increase activity slowly   Complete by: As directed    No dressing needed   Complete by: As directed      Allergies as of 05/30/2019      Reactions   Codeine Nausea And Vomiting   Sulfa Antibiotics    Hair loss, yellowed skin   Doxycycline Hyclate Itching      Medication List    TAKE these medications   acetaminophen 500 MG tablet Commonly known as: TYLENOL Take 500 mg by mouth every 8 (eight) hours as needed for headache.   amLODipine 5 MG tablet Commonly known as: NORVASC TAKE 1 TABLET EVERY DAY   cetirizine 10 MG tablet Commonly known as: ZYRTEC Take 10 mg by mouth daily.   Claritin 10 MG tablet Generic drug: loratadine Take 10 mg by mouth daily.   DULoxetine 60 MG capsule Commonly known as: CYMBALTA Take 1 capsule (60 mg total) by mouth daily.   famotidine 10 MG tablet Commonly known as: PEPCID Take 10 mg by mouth daily.   Fish Oil 1000 MG Caps Take 1,000 mg by mouth daily.   fluticasone 50 MCG/ACT nasal spray Commonly known as: FLONASE USE 2 SPRAYS IN EACH NOSTRIL EVERY DAY   furosemide 20 MG tablet Commonly known as: LASIX Take 1 tablet (20 mg total) by mouth daily.   Gin-Zing 100 MG Caps Take 100 mg by mouth daily.   hyoscyamine 0.125 MG Tbdp disintergrating tablet Commonly known as: ANASPAZ Take 1 tablet (0.125 mg total) by mouth every 6 (six) hours as needed. What changed: reasons to take this   IMODIUM PO Take by mouth as needed.   melatonin 5 MG Tabs Take 5 mg by mouth at bedtime.   meloxicam 15 MG  tablet Commonly known as: MOBIC Take 1 tablet (15 mg total) by mouth daily.   NON FORMULARY Take 1 tablet by mouth daily. Forebrain otc-for memory   ondansetron 4 MG tablet Commonly known as: Zofran Take 1 tablet (4 mg total) by mouth every 8 (eight) hours as needed for nausea or vomiting.   OVER THE COUNTER MEDICATION Take 250 mg by mouth daily. Hemp oil/ 1 drop under tongue   OVER THE COUNTER MEDICATION Apply 1 application topically in the morning and at bedtime. Roll on/ Hemp oil 1000 mg   oxyCODONE 5 MG immediate release tablet Commonly known as: Oxy IR/ROXICODONE Take 1-2 tablets (5-10 mg total) by mouth every 4 (four) hours as needed for moderate pain.   pantoprazole 40 MG tablet Commonly known as: PROTONIX Take 1 tablet (40 mg total) by mouth daily.   PROBIOTIC & ACIDOPHILUS EX ST PO Take 1 capsule by mouth at bedtime.   traMADol 50 MG tablet Commonly known as: ULTRAM Take 1 tablet (50 mg total) by  mouth every 8 (eight) hours as needed (mild pain). What changed: when to take this   vitamin B-12 1000 MCG tablet Commonly known as: CYANOCOBALAMIN Take 1,000 mcg by mouth daily.   Vitamin D-1000 Max St 25 MCG (1000 UT) tablet Generic drug: Cholecalciferol Take 3,000 Units by mouth daily.      Follow-up Information    Armandina Gemma, MD. Schedule an appointment as soon as possible for a visit in 2 week(s).   Specialty: General Surgery Contact information: [address] [place] [address] [phone]           Earnstine Regal, MD, [facility] Office: [phone]   Signed: Armandina Gemma 05/30/2019, 8:11 AM

## 2019-06-01 NOTE — Anesthesia Postprocedure Evaluation (Signed)
Anesthesia Post Note  Patient: Maria Moss  Procedure(s) Performed: RIGHT INFERIOR PARATHYROIDECTOMY (Right ) RIGHT THYROID LOBECTOMY (Right )     Patient location during evaluation: PACU Anesthesia Type: General Level of consciousness: awake Pain management: pain level controlled Vital Signs Assessment: post-procedure vital signs reviewed and stable Respiratory status: spontaneous breathing Cardiovascular status: stable Postop Assessment: no apparent nausea or vomiting Anesthetic complications: no    Last Vitals:  Vitals:   05/30/19 0105 05/30/19 0524  BP: 137/74 125/69  Pulse: 82 78  Resp: 18 18  Temp: 36.7 C 36.7 C  SpO2: 94% 91%    Last Pain:  Vitals:   05/30/19 0524  TempSrc: Oral  PainSc:    Pain Goal: Patients Stated Pain Goal: 2 (05/30/19 0331)                 Huston Foley

## 2019-06-11 ENCOUNTER — Other Ambulatory Visit: Payer: Medicare HMO

## 2019-06-11 ENCOUNTER — Other Ambulatory Visit: Payer: Self-pay

## 2019-06-11 DIAGNOSIS — E210 Primary hyperparathyroidism: Secondary | ICD-10-CM | POA: Diagnosis not present

## 2019-06-12 ENCOUNTER — Telehealth: Payer: Self-pay | Admitting: Internal Medicine

## 2019-06-12 NOTE — Telephone Encounter (Signed)
Patient is scheduled for appointment on 07/20/19

## 2019-06-12 NOTE — Telephone Encounter (Signed)
-----   Message from Philemon Kingdom, MD sent at 06/11/2019  5:14 PM EDT ----- Maria Moss, This patient just had thyroid and parathyroid surgery.  She will need an appointment with me in approximately 4-6 weeks.  Can you please schedule this? Ty!

## 2019-06-12 NOTE — Telephone Encounter (Signed)
LM for patient to call back and schedule a F/U post thyroid and parathyroid surgery per provider.

## 2019-06-21 ENCOUNTER — Other Ambulatory Visit: Payer: Self-pay | Admitting: Family Medicine

## 2019-06-21 DIAGNOSIS — F411 Generalized anxiety disorder: Secondary | ICD-10-CM

## 2019-06-21 DIAGNOSIS — G8929 Other chronic pain: Secondary | ICD-10-CM

## 2019-07-07 DIAGNOSIS — L03115 Cellulitis of right lower limb: Secondary | ICD-10-CM | POA: Diagnosis not present

## 2019-07-07 DIAGNOSIS — R600 Localized edema: Secondary | ICD-10-CM | POA: Diagnosis not present

## 2019-07-18 ENCOUNTER — Encounter: Payer: Self-pay | Admitting: Family Medicine

## 2019-07-18 ENCOUNTER — Ambulatory Visit (INDEPENDENT_AMBULATORY_CARE_PROVIDER_SITE_OTHER): Payer: Medicare HMO | Admitting: Family Medicine

## 2019-07-18 ENCOUNTER — Other Ambulatory Visit: Payer: Self-pay

## 2019-07-18 VITALS — BP 147/81 | HR 77 | Temp 97.0°F | Ht 61.0 in | Wt 149.0 lb

## 2019-07-18 DIAGNOSIS — E782 Mixed hyperlipidemia: Secondary | ICD-10-CM

## 2019-07-18 DIAGNOSIS — M5136 Other intervertebral disc degeneration, lumbar region: Secondary | ICD-10-CM

## 2019-07-18 DIAGNOSIS — I10 Essential (primary) hypertension: Secondary | ICD-10-CM

## 2019-07-18 MED ORDER — HYOSCYAMINE SULFATE 0.125 MG PO TBDP: 0.1250 mg | ORAL_TABLET | Freq: Four times a day (QID) | ORAL | 4 refills | Status: DC | PRN

## 2019-07-18 MED ORDER — TRAMADOL HCL 50 MG PO TABS: 50.0000 mg | ORAL_TABLET | Freq: Three times a day (TID) | ORAL | 2 refills | Status: DC | PRN

## 2019-07-18 NOTE — Progress Notes (Signed)
BP (!) 147/81   Pulse 77   Temp (!) 97 F (36.1 C)   Ht 5\' 1"  (1.549 m)   Wt 149 lb (67.6 kg)   SpO2 96%   BMI 28.15 kg/m    Subjective:   Patient ID: Maria Moss, female    DOB: 1950-12-23, 69 y.o.   MRN: [id number]  HPI: Maria Moss is a 69 y.o. female presenting on 07/18/2019 for Coronary Artery Disease, Hyperlipidemia, and Hypertension   HPI Hypertension Patient is currently on Amlodipine, and their blood pressure today is 147/81. Patient denies any lightheadedness or dizziness. Patient denies headaches, blurred vision, chest pains, shortness of breath, or weakness. Denies any side effects from medication and is content with current medication.   Hyperlipidemia Patient is coming in for recheck of his hyperlipidemia. The patient is currently taking fish oil. They deny any issues with myalgias or history of liver damage from it. They deny any focal numbness or weakness or chest pain.   Pain assessment: Cause of pain-chronic back pain and degenerative disc Pain location-bilateral back Pain on scale of 1-10-3 Frequency-Daily What increases pain-movement or standing or prolonged sitting What makes pain Better-tramadol helps some Effects on ADL -affects walking and standing and sitting for long periods Any change in general medical condition-found thyroid cancer but doing okay with that  Current opioids rx-tramadol 50 mg 3 times daily as needed # meds rx-90 Effectiveness of current meds-works okay, helps enough for now Adverse reactions form pain meds-none Morphine equivalent- 15  Pill count performed-No Last drug screen - 10/18/2018 ( high risk q9m, moderate risk q54m, low risk yearly ) Urine drug screen today- No Was the [facility] reviewed- yes  If yes were their any concerning findings? - none   No flowsheet data found.   Pain contract signed on: 10/18/2018  Relevant past medical, surgical, family and social history reviewed and updated as indicated. Interim medical  history since our last visit reviewed. Allergies and medications reviewed and updated.  Review of Systems  Constitutional: Negative for chills and fever.  Eyes: Negative for redness and visual disturbance.  Respiratory: Negative for chest tightness and shortness of breath.   Cardiovascular: Negative for chest pain and leg swelling.  Musculoskeletal: Positive for arthralgias and back pain. Negative for gait problem.  Skin: Negative for rash.  Neurological: Negative for light-headedness and headaches.  Psychiatric/Behavioral: Negative for agitation and behavioral problems.  All other systems reviewed and are negative.   Per HPI unless specifically indicated above   Allergies as of 07/18/2019      Reactions   Codeine Nausea And Vomiting   Sulfa Antibiotics    Hair loss, yellowed skin   Doxycycline Hyclate Itching      Medication List       Accurate as of July 18, 2019 12:09 PM. If you have any questions, ask your nurse or doctor.        STOP taking these medications   Gin-Zing 100 MG Caps Stopped by: Nils Pyle, MD   melatonin 5 MG Tabs Stopped by: Elige Radon Kacey Dysert, MD   oxyCODONE 5 MG immediate release tablet Commonly known as: Oxy IR/ROXICODONE Stopped by: Elige Radon Raylee Adamec, MD     TAKE these medications   acetaminophen 500 MG tablet Commonly known as: TYLENOL Take 500 mg by mouth every 8 (eight) hours as needed for headache.   amLODipine 5 MG tablet Commonly known as: NORVASC TAKE 1 TABLET EVERY DAY   cetirizine 10 MG tablet  Commonly known as: ZYRTEC Take 10 mg by mouth daily.   Claritin 10 MG tablet Generic drug: loratadine Take 10 mg by mouth daily.   DULoxetine 60 MG capsule Commonly known as: CYMBALTA TAKE 1 CAPSULE EVERY DAY   famotidine 10 MG tablet Commonly known as: PEPCID Take 10 mg by mouth daily.   Fish Oil 1000 MG Caps Take 1,000 mg by mouth daily.   fluticasone 50 MCG/ACT nasal spray Commonly known as: FLONASE USE 2  SPRAYS IN EACH NOSTRIL EVERY DAY   furosemide 20 MG tablet Commonly known as: LASIX Take 1 tablet (20 mg total) by mouth daily.   hyoscyamine 0.125 MG Tbdp disintergrating tablet Commonly known as: ANASPAZ Take 1 tablet (0.125 mg total) by mouth every 6 (six) hours as needed. What changed: reasons to take this   IMODIUM PO Take by mouth as needed.   meloxicam 15 MG tablet Commonly known as: MOBIC TAKE 1 TABLET EVERY DAY   NON FORMULARY Take 1 tablet by mouth daily. Forebrain otc-for memory   ondansetron 4 MG tablet Commonly known as: Zofran Take 1 tablet (4 mg total) by mouth every 8 (eight) hours as needed for nausea or vomiting.   OVER THE COUNTER MEDICATION Take 250 mg by mouth daily. Hemp oil/ 1 drop under tongue   OVER THE COUNTER MEDICATION Apply 1 application topically in the morning and at bedtime. Roll on/ Hemp oil 1000 mg   pantoprazole 40 MG tablet Commonly known as: PROTONIX Take 1 tablet (40 mg total) by mouth daily.   PROBIOTIC & ACIDOPHILUS EX ST PO Take 1 capsule by mouth at bedtime.   traMADol 50 MG tablet Commonly known as: ULTRAM Take 1 tablet (50 mg total) by mouth every 8 (eight) hours as needed (mild pain). What changed: when to take this   vitamin B-12 1000 MCG tablet Commonly known as: CYANOCOBALAMIN Take 1,000 mcg by mouth daily.   Vitamin D-1000 Max St 25 MCG (1000 UT) tablet Generic drug: Cholecalciferol Take 3,000 Units by mouth daily.        Objective:   BP (!) 147/81   Pulse 77   Temp (!) 97 F (36.1 C)   Ht 5\' 1"  (1.549 m)   Wt 149 lb (67.6 kg)   SpO2 96%   BMI 28.15 kg/m   Wt Readings from Last 3 Encounters:  07/18/19 149 lb (67.6 kg)  05/30/19 150 lb (68 kg)  05/23/19 150 lb (68 kg)    Physical Exam Vitals and nursing note reviewed.  Constitutional:      General: She is not in acute distress.    Appearance: She is well-developed. She is not diaphoretic.  Eyes:     Conjunctiva/sclera: Conjunctivae normal.   Cardiovascular:     Rate and Rhythm: Normal rate and regular rhythm.     Heart sounds: Normal heart sounds. No murmur heard.   Pulmonary:     Effort: Pulmonary effort is normal. No respiratory distress.     Breath sounds: Normal breath sounds. No wheezing.  Skin:    General: Skin is warm and dry.     Findings: No rash.  Neurological:     Mental Status: She is alert and oriented to person, place, and time.     Coordination: Coordination normal.  Psychiatric:        Behavior: Behavior normal.       Assessment & Plan:   Problem List Items Addressed This Visit      Cardiovascular and Mediastinum  Hypertension - Primary (Chronic)     Musculoskeletal and Integument   Degenerative disc disease, lumbar (Chronic)   Relevant Medications   traMADol (ULTRAM) 50 MG tablet     Other   Hyperlipemia (Chronic)      We will continue patient's tramadol and give refill of her hyoscyamine, she seems to be doing okay on these, she does have some insomnia but she will double her trazodone that she has and see if that helps sufficiently. Follow up plan: Return in about 3 months (around 10/18/2019), or if symptoms worsen or fail to improve, for Hypertension and cholesterol and pain management.  Counseling provided for all of the vaccine components No orders of the defined types were placed in this encounter.   Arville Care, MD [facility] 07/18/2019, 12:09 PM

## 2019-07-20 ENCOUNTER — Encounter: Payer: Self-pay | Admitting: Internal Medicine

## 2019-07-20 ENCOUNTER — Other Ambulatory Visit: Payer: Self-pay

## 2019-07-20 ENCOUNTER — Ambulatory Visit (INDEPENDENT_AMBULATORY_CARE_PROVIDER_SITE_OTHER): Payer: Medicare HMO | Admitting: Internal Medicine

## 2019-07-20 VITALS — BP 148/80 | HR 88 | Ht 61.0 in | Wt 153.0 lb

## 2019-07-20 DIAGNOSIS — E278 Other specified disorders of adrenal gland: Secondary | ICD-10-CM

## 2019-07-20 DIAGNOSIS — Z9009 Acquired absence of other part of head and neck: Secondary | ICD-10-CM | POA: Diagnosis not present

## 2019-07-20 DIAGNOSIS — E559 Vitamin D deficiency, unspecified: Secondary | ICD-10-CM | POA: Diagnosis not present

## 2019-07-20 DIAGNOSIS — E890 Postprocedural hypothyroidism: Secondary | ICD-10-CM

## 2019-07-20 DIAGNOSIS — E041 Nontoxic single thyroid nodule: Secondary | ICD-10-CM

## 2019-07-20 DIAGNOSIS — E213 Hyperparathyroidism, unspecified: Secondary | ICD-10-CM | POA: Diagnosis not present

## 2019-07-20 LAB — TSH: TSH: 3.55 u[IU]/mL (ref 0.35–4.50)

## 2019-07-20 LAB — T4, FREE: Free T4: 0.83 ng/dL (ref 0.60–1.60)

## 2019-07-20 LAB — T3, FREE: T3, Free: 3.1 pg/mL (ref 2.3–4.2)

## 2019-07-20 NOTE — Patient Instructions (Addendum)
Please stop at the lab.  If we need to start thyroid hormone (Levothyroxine), Take it every day, with water, at least 30 minutes before breakfast, separated by at least 4 hours from: - acid reflux medications - calcium - iron - multivitamins  Please come back for a follow-up appointment in 6 months.

## 2019-07-20 NOTE — Progress Notes (Signed)
Patient ID: Maria Moss, female   DOB: 1950-11-26, 69 y.o.   MRN: [id number]   This visit occurred during the SARS-CoV-2 public health emergency.  Safety protocols were in place, including screening questions prior to the visit, additional usage of staff PPE, and extensive cleaning of exam room while observing appropriate contact time as indicated for disinfecting solutions.   HPI  Maria Moss is a 69 y.o.-year-old female, initially referred by Dr. Rosendo Gros [facility]), presenting for follow-up for a R adrenal incidentaloma.  Since last visit, we also investigated and treatied her for primary hyperparathyroidism and a right thyroid nodule which turned out to be a follicular variant of papillary thyroid cancer.  Last visit 6 months ago.  Right adrenal incidentaloma:  Lumbar MRI (10/28/2017): 5.2 x 4.4 R renal or adrenal heterogeneous soft tissue mass  CT abdomen w/ and w/o contrast (11/04/2017): R adrenal 5 cm complex necrotic appearing and enhancing mass with nodular areas of contrast enhancement and scattered areas of calcification >> possible RCC vs. metastasis  PET CT (11/18/2017):  CHEST: No hypermetabolic mediastinal or hilar nodes. No suspicious pulmonary nodules on the CT scan. ABDOMEN/PELVIS: Large RIGHT renal mass again noted measuring 5.0 by 4.5 cm. Mass has central photopenia and mild peripheral metabolic activity with SUV max equal 3.1 (along the medial border of the mass for example.) This mild activity is similar to liver activity (SUV max equal 3.8). The LEFT adrenal glands normal. No other sites of abnormal FDG uptake.  IMPRESSION: 1. Large RIGHT adrenal mass with central photopenia and mild peripheral metabolic activity. Findings are nonspecific. Recommend surgical consultation for lesions of this size. 2. No evidence of primary or metastatic carcinoma outside of the adrenal gland.  Of note, she had normal testosterone, estradiol, 24-hour urine cortisol and 24-hour  urine metanephrines and catecholamines before the surgery.  After the surgery, a cosyntropin stimulation test was normal.  Please review my last office visit note for more details of the hormonal investigation.  She had right adrenalectomy by Dr. 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  She felt well after the surgery, without complaints.  She also has a history of vitamin D deficiency: Lab Results  Component Value Date   VD25OH 73.31 02/02/2019   VD25OH 62.95 07/28/2018   VD25OH 29.1 (L) 04/14/2017   VD25OH 43.9 11/02/2013  She continues on 3000 units vitamin D daily.  He has a history of primary hyperparathyroidism: Lab Results  Component Value Date   PTH 46 03/05/2019   PTH 54 02/02/2019   PTH Comment 02/02/2019   PTH 41 11/10/2017   PTH Comment 11/10/2017   Lab Results  Component Value Date   CALCIUM 8.7 (L) 05/30/2019   CALCIUM 10.0 05/25/2019   CALCIUM 10.6 (H) 04/16/2019   CALCIUM 11.0 (H) 03/05/2019   CALCIUM 10.4 (H) 02/13/2019   CALCIUM 10.5 (H) 02/02/2019   CALCIUM 10.0 10/13/2018   CALCIUM 10.1 08/24/2018   CALCIUM 10.3 08/17/2018   CALCIUM 8.6 (L) 06/01/2018   CALCIUM 9.9 05/22/2018   CALCIUM 10.3 01/27/2018   CALCIUM 10.5 (H) 11/10/2017   CALCIUM 10.4 (H) 10/21/2017   CALCIUM 10.5 (H) 06/02/2017   CALCIUM 10.4 (H) 04/14/2017   CALCIUM 10.4 (H) 12/16/2016   CALCIUM 10.5 (H) 09/24/2016   CALCIUM 10.6 (H) 09/10/2016   CALCIUM 10.2 06/11/2016   CALCIUM 10.3 03/05/2016   CALCIUM 9.8 11/28/2015   CALCIUM 10.3 08/29/2015   CALCIUM 10.1 03/21/2015   CALCIUM 10.4 (H) 11/29/2014  CALCIUM 9.9 05/17/2014   CALCIUM 10.3 02/15/2014   CALCIUM 10.3 07/19/2013   CALCIUM 10.0 03/19/2013   CALCIUM 10.6 (H) 11/22/2012   CALCIUM 10.7 (H) 07/21/2012   She was initially on Tums which was stopped and calcium normalized, but it started to increase again before last visit.  After last visit, I referred her for technetium sestamibi scan  (05/03/2019) which showed RIGHT inferior parathyroid adenoma.  Cannot exclude coexistent parathyroid adenoma at the RIGHT superior parathyroid gland.  Potential cold nodule within the RIGHT thyroid lobe near upper pole; consider follow-up thyroid ultrasound assessment to further evaluate.  Thyroid U/S (05/11/2019): Parenchymal Echotexture: Moderately heterogenous Isthmus: 4 mm Right lobe: 6.1 x 2.2 x 3.1 cm Left lobe: 5.1 x 1.2 x 1.6 cm _______________________________________________________  Estimated total number of nodules >/= 1 cm: 1 _____________________________________________  Nodule # 1: Location: Right; Mid Maximum size: 2.2 cm; Other 2 dimensions: 2.0 x 1.8 cm Composition: spongiform (0) Echogenicity: isoechoic (1) This nodule does NOT meet TI-RADS criteria for biopsy or dedicated follow-up. _________________________________________________________  Moderate thyroid heterogeneity with mixed echogenicity and background pseudo nodularity. No hypervascularity. No regional adenopathy.  IMPRESSION: 2.2 cm right mid thyroid TR 1 spongiform nodule correlates with the cold nodule by the parathyroid scan. This would not meet criteria for any biopsy or follow-up.  Nonspecific thyroid heterogeneity.  Since the nodule was not high risk, I referred her to surgery for the parathyroid adenoma.  She had right inferior parathyroidectomy + right thyroid lobectomy (05/29/2019)-Dr. Gerkin.  Pathology showed parathyroid adenoma and calcium decreased from 11 preop to 9.7 postop  (06/11/2019).  PTH postop was 51.  However, the pathology of the right thyroid nodule showed follicular variant of papillary thyroid cancer, 1.7 cm, with negative surgical margins.  She also has osteopenia per review of her DXA scan report from 2018, 2019:  DXA scan ([facility]) from 04/14/2017: L1-L4: -1.0 RFN: -1.7 LFN: -1.6 33% distal radius: -0.4 FRAX: 15.8%, 2%  DXA scan ([facility]) from 10/24/2016: L1-L4: -0.5 RFN: -1.0 LFN: -1.2  Apparently she had a anew DXA scan in 2019.  No history of kidney stones.   ROS: Constitutional: no weight gain/no weight loss, no fatigue, no subjective hyperthermia, no subjective hypothermia Eyes: no blurry vision, no xerophthalmia ENT: no sore throat, no nodules palpated in neck, no dysphagia, no odynophagia, no hoarseness Cardiovascular: no CP/no SOB/no palpitations/no leg swelling Respiratory: no cough/no SOB/no wheezing Gastrointestinal: no N/no V/+ D/no C/no acid reflux Musculoskeletal: no muscle aches/+ joint aches Skin: no rashes, no hair loss Neurological: no tremors/no numbness/no tingling/no dizziness  I reviewed pt's medications, allergies, PMH, social hx, family hx, and changes were documented in the history of present illness. Otherwise, unchanged from my initial visit note.  Past Medical History:  Diagnosis Date  . Anemia    iron deficiency  . Ankle fracture, left 2006  . Diverticula, intestine    Mild case.  . Fibromyalgia    Dr. Justine Null Dx in [place] years ago  no meds   . GERD (gastroesophageal reflux disease)   . History of hiatal hernia   . Hyperlipidemia   . Hyperparathyroidism ([place])   . Hypertension    Dx age 31.   . Stroke [facility])    Questionable stoke when born or polio not sure pt. has right side weakness toes right foot turns outward and toes curled up some     Past Surgical History:  Procedure Laterality Date  . ANKLE SURGERY Left    Broken.  Has screws and metal plate.  . CHOLECYSTECTOMY    . PARATHYROIDECTOMY Right 05/29/2019   Procedure: RIGHT INFERIOR PARATHYROIDECTOMY;  Surgeon: Armandina Gemma, MD;  Location: [facility];  Service: General;  Laterality: Right;  . ROBOTIC ADRENALECTOMY Right 05/31/2018   Procedure: XI ROBOTIC RIGHT ADRENALECTOMY;  Surgeon: Ralene Ok, MD;  Location: [facility];  Service: General;  Laterality: Right;  . THYROID LOBECTOMY Right 05/29/2019    Procedure: RIGHT THYROID LOBECTOMY;  Surgeon: Armandina Gemma, MD;  Location: [facility];  Service: General;  Laterality: Right;    Social History   Socioeconomic History  . Marital status: Married    Spouse name: Not on file  . Number of children: Not on file  . Years of education: Not on file  . Highest education level: Not on file  Occupational History  . Not on file  Tobacco Use  . Smoking status: Never Smoker  . Smokeless tobacco: Never Used  Vaping Use  . Vaping Use: Never used  Substance and Sexual Activity  . Alcohol use: No  . Drug use: No  . Sexual activity: Not Currently  Other Topics Concern  . Not on file  Social History Narrative  . Not on file   Social Determinants of Health   Financial Resource Strain:   . Difficulty of Paying Living Expenses:   Food Insecurity:   . Worried About Charity fundraiser in the Last Year:   . Arboriculturist in the Last Year:   Transportation Needs:   . Film/video editor (Medical):   Marland Kitchen Lack of Transportation (Non-Medical):   Physical Activity:   . Days of Exercise per Week:   . Minutes of Exercise per Session:   Stress:   . Feeling of Stress :   Social Connections:   . Frequency of Communication with Friends and Family:   . Frequency of Social Gatherings with Friends and Family:   . Attends Religious Services:   . Active Member of Clubs or Organizations:   . Attends Archivist Meetings:   Marland Kitchen Marital Status:   Intimate Partner Violence:   . Fear of Current or Ex-Partner:   . Emotionally Abused:   Marland Kitchen Physically Abused:   . Sexually Abused:     Current Outpatient Medications on File Prior to Visit  Medication Sig Dispense Refill  . acetaminophen (TYLENOL) 500 MG tablet Take 500 mg by mouth every 8 (eight) hours as needed for headache.    Marland Kitchen amLODipine (NORVASC) 5 MG tablet TAKE 1 TABLET EVERY DAY (Patient taking differently: Take 5 mg by mouth daily. ) 90 tablet 3  . cetirizine (ZYRTEC) 10 MG tablet Take 10 mg  by mouth daily.    . Cholecalciferol (VITAMIN D-1000 MAX ST) 1000 units tablet Take 3,000 Units by mouth daily.     . DULoxetine (CYMBALTA) 60 MG capsule TAKE 1 CAPSULE EVERY DAY 90 capsule 0  . famotidine (PEPCID) 10 MG tablet Take 10 mg by mouth daily.     . fluticasone (FLONASE) 50 MCG/ACT nasal spray USE 2 SPRAYS IN EACH NOSTRIL EVERY DAY (Patient taking differently: Place 2 sprays into both nostrils daily. ) 48 g 5  . furosemide (LASIX) 20 MG tablet Take 1 tablet (20 mg total) by mouth daily. 90 tablet 3  . hyoscyamine (ANASPAZ) 0.125 MG TBDP disintergrating tablet Take 1 tablet (0.125 mg total) by mouth every 6 (six) hours as needed. 30 tablet 4  . Loperamide HCl (IMODIUM PO) Take by mouth as  needed.    . loratadine (CLARITIN) 10 MG tablet Take 10 mg by mouth daily.    . meloxicam (MOBIC) 15 MG tablet TAKE 1 TABLET EVERY DAY 90 tablet 0  . NON FORMULARY Take 1 tablet by mouth daily. Forebrain otc-for memory     . Omega-3 Fatty Acids (FISH OIL) 1000 MG CAPS Take 1,000 mg by mouth daily.     . ondansetron (ZOFRAN) 4 MG tablet Take 1 tablet (4 mg total) by mouth every 8 (eight) hours as needed for nausea or vomiting. 20 tablet 0  . OVER THE COUNTER MEDICATION Take 250 mg by mouth daily. Hemp oil/ 1 drop under tongue    . OVER THE COUNTER MEDICATION Apply 1 application topically in the morning and at bedtime. Roll on/ Hemp oil 1000 mg    . pantoprazole (PROTONIX) 40 MG tablet Take 1 tablet (40 mg total) by mouth daily. 90 tablet 3  . Probiotic Product (PROBIOTIC & ACIDOPHILUS EX ST PO) Take 1 capsule by mouth at bedtime.     . traMADol (ULTRAM) 50 MG tablet Take 1 tablet (50 mg total) by mouth every 8 (eight) hours as needed (mild pain). 90 tablet 2  . vitamin B-12 (CYANOCOBALAMIN) 1000 MCG tablet Take 1,000 mcg by mouth daily.     No current facility-administered medications on file prior to visit.    Allergies  Allergen Reactions  . Codeine Nausea And Vomiting  . Sulfa Antibiotics      Hair loss, yellowed skin  . Doxycycline Hyclate Itching    Family History  Problem Relation Age of Onset  . Heart disease Mother        CHF  . Cancer Mother        Uterine  . Osteoporosis Mother   . COPD Sister   . Asthma Sister   . Migraines Sister     PE: BP (!) 148/80   Pulse 88   Ht 5\' 1"  (1.549 m)   Wt 153 lb (69.4 kg)   SpO2 95%   BMI 28.91 kg/m  Wt Readings from Last 3 Encounters:  07/20/19 153 lb (69.4 kg)  07/18/19 149 lb (67.6 kg)  05/30/19 150 lb (68 kg)   Constitutional: normal weight, in NAD Eyes: PERRLA, EOMI, no exophthalmos ENT: moist mucous membranes, no neck masses palpated; cervical scar almost completely healed, no cervical lymphadenopathy Cardiovascular: RRR, No MRG, B lower edema -pitting Respiratory: CTA B Gastrointestinal: abdomen soft, NT, ND, BS+ Musculoskeletal: no deformities, strength intact in all 4 Skin: moist, warm, no rashes Neurological: no tremor with outstretched hands, DTR normal in all 4  ASSESSMENT: 1. Adrenal incidentaloma  2.  Primary hyperparathyroidism  3.  Vitamin D deficiency  4.  Follicular variant of papillary thyroid cancer  5. S/p right thyroid lobectomy  6.  Osteopenia  PLAN:  1. Patient with a history of right adrenal nodule discovered incidentally. This had necrotic areas and scattered areas of calcifications, and measured 5 x 4.5 cm.  The PET CT scan did not show increased FDG uptake to suggest malignancy.  We ruled out adrenal hormone hyper secretion and I cleared her for surgery.  She had surgery 2 months before our last visit and the pathology was benign (cavernous hemangioma).  After surgery, her right leg pain has resolved. -Postsurgical cosyntropin stimulation test was normal, ruling out adrenal insufficiency. -No further investigation is needed for this  2.  Primary hyperparathyroidism -Patient with history of hypercalcemia and nonsuppressed PTH, with technetium sestamibi scan positive  for a R inf.   Adenoma.  This was resected and her calcium decreased to normal after the surgery. -We will recheck calcium and PTH today  3.  Vitamin D deficiency -Her vitamin D level was slightly low in 2019 so we restarted vitamin D at 3000 units daily -Latest vitamin D level was normal in 07/2018 -We will repeat it today  4.  Follicular variant of papillary thyroid cancer -new -At this visit, we discussed that her cancer is low risk since it was lower than 2 cm without any signs of extension or invasion of the lymphovascular system -In this case, completion thyroidectomy is not mandatory -We can continue to follow her with another ultrasound in a year and then as needed afterwards.  5. S/p right thyroid lobectomy -Her neck scar is  healing well, and it appears almost completely healed, without keloid, pain, erythema, or dysesthesia -At today's visit, we will check her TFTs -We discussed about how to take levothyroxine, in case she needs to start: every day, with water, at least 30 minutes before breakfast, separated by at least 4 hours from: - acid reflux medications - calcium - iron - multivitamins  6.  Osteopenia -Reviewed together latest DXA scan from 2018 and 2019: T scores were low, which is significant decrease at last check. -We discussed that it is very possible that her bone density will improve. -Plan to repeat another bone density scan at next visit, approximately 8 months from her parathyroid surgery.  Total time spent for the visit: 40 minutes, in obtaining medical information from the chart, reviewing her  previous labs, surgical reports, surgical reports, Moss from Dr. Harlow Asa,  imaging evaluations, and treatments, reviewing her symptoms, counseling her about her conditions (please see the discussed topics above), and developing a plan to further investigate and treat them; she had a number of questions which I addressed.  Component     Latest Ref Rng & Units 07/20/2019  Calcium      8.7 - 10.3 mg/dL 9.6  PTH, Intact     15 - 65 pg/mL 52  PTH Interp      Comment  TSH     0.35 - 4.50 uIU/mL 3.55  Vitamin D, 25-Hydroxy     30.0 - 100.0 ng/mL 50.2  T4,Free(Direct)     0.60 - 1.60 ng/dL 0.83  Triiodothyronine,Free,Serum     2.3 - 4.2 pg/mL 3.1   All labs are normal. Will plan to repeat her TFTs in 1.5-2 months, but no intervention needed for now.  Philemon Kingdom, MD PhD [facility]

## 2019-07-21 LAB — PTH, INTACT AND CALCIUM
Calcium: 9.6 mg/dL (ref 8.7–10.3)
PTH: 52 pg/mL (ref 15–65)

## 2019-07-21 LAB — VITAMIN D 25 HYDROXY (VIT D DEFICIENCY, FRACTURES): Vit D, 25-Hydroxy: 50.2 ng/mL (ref 30.0–100.0)

## 2019-07-27 ENCOUNTER — Other Ambulatory Visit: Payer: Self-pay | Admitting: Family Medicine

## 2019-07-27 DIAGNOSIS — I10 Essential (primary) hypertension: Secondary | ICD-10-CM

## 2019-08-03 DIAGNOSIS — R9431 Abnormal electrocardiogram [ECG] [EKG]: Secondary | ICD-10-CM | POA: Diagnosis not present

## 2019-08-03 DIAGNOSIS — F329 Major depressive disorder, single episode, unspecified: Secondary | ICD-10-CM | POA: Diagnosis not present

## 2019-08-03 DIAGNOSIS — F419 Anxiety disorder, unspecified: Secondary | ICD-10-CM | POA: Diagnosis not present

## 2019-08-03 DIAGNOSIS — I10 Essential (primary) hypertension: Secondary | ICD-10-CM | POA: Diagnosis not present

## 2019-08-03 DIAGNOSIS — R0602 Shortness of breath: Secondary | ICD-10-CM | POA: Diagnosis not present

## 2019-08-03 DIAGNOSIS — K449 Diaphragmatic hernia without obstruction or gangrene: Secondary | ICD-10-CM | POA: Diagnosis not present

## 2019-08-03 DIAGNOSIS — M79602 Pain in left arm: Secondary | ICD-10-CM | POA: Diagnosis not present

## 2019-08-03 DIAGNOSIS — R079 Chest pain, unspecified: Secondary | ICD-10-CM | POA: Diagnosis not present

## 2019-08-03 DIAGNOSIS — K219 Gastro-esophageal reflux disease without esophagitis: Secondary | ICD-10-CM | POA: Diagnosis not present

## 2019-08-03 DIAGNOSIS — M791 Myalgia, unspecified site: Secondary | ICD-10-CM | POA: Diagnosis not present

## 2019-08-07 ENCOUNTER — Telehealth: Payer: Self-pay | Admitting: Family Medicine

## 2019-08-07 NOTE — Telephone Encounter (Signed)
Appt made with [name] on 08/10/19, pt aware

## 2019-08-10 ENCOUNTER — Other Ambulatory Visit: Payer: Self-pay

## 2019-08-10 ENCOUNTER — Encounter: Payer: Self-pay | Admitting: Family

## 2019-08-10 ENCOUNTER — Ambulatory Visit (INDEPENDENT_AMBULATORY_CARE_PROVIDER_SITE_OTHER): Payer: Medicare HMO | Admitting: Family

## 2019-08-10 VITALS — BP 155/88 | HR 98 | Temp 97.3°F | Ht 61.0 in | Wt 149.6 lb

## 2019-08-10 DIAGNOSIS — R079 Chest pain, unspecified: Secondary | ICD-10-CM

## 2019-08-10 DIAGNOSIS — Z09 Encounter for follow-up examination after completed treatment for conditions other than malignant neoplasm: Secondary | ICD-10-CM | POA: Diagnosis not present

## 2019-08-10 DIAGNOSIS — R5383 Other fatigue: Secondary | ICD-10-CM | POA: Diagnosis not present

## 2019-08-10 NOTE — Patient Instructions (Signed)

## 2019-08-10 NOTE — Progress Notes (Signed)
Subjective:    Patient ID: [name], female    DOB: January 20, 1950, 69 y.o.   MRN: [id number]  Chief Complaint  Patient presents with  . Hospitalization Follow-up    HPI /Pt presents to the office today for hospital follow up. She went to the ED on 08/03/19 for chest pain that had started a few hours prior while eating. She had negative cardiac enzymes, EKG stable, negative chest x-ray, negative CT.    They thought this was related to MSK pain and was given steroids. She reports she feels fatigued. She reports she can not sleep well while taking the steroids and believes this is the cause of her fatigue.   She reports she is having a urinary frequency that started a few months ago. She denies any fever or dysuria.   Denies any chest pain or palpitations.  Does report mild SOB with exertion.   Review of Systems  Respiratory: Positive for shortness of breath. Negative for chest tightness.   All other systems reviewed and are negative.      Objective:   Physical Exam Vitals reviewed.  Constitutional:      General: She is not in acute distress.    Appearance: She is well-developed.  HENT:     Head: Normocephalic and atraumatic.     Right Ear: Tympanic membrane normal.     Left Ear: Tympanic membrane normal.  Eyes:     Pupils: Pupils are equal, round, and reactive to light.  Neck:     Thyroid: No thyromegaly.  Cardiovascular:     Rate and Rhythm: Normal rate and regular rhythm.     Heart sounds: Normal heart sounds. No murmur heard.   Pulmonary:     Effort: Pulmonary effort is normal. No respiratory distress.     Breath sounds: Normal breath sounds. No wheezing.  Abdominal:     General: Bowel sounds are normal. There is no distension.     Palpations: Abdomen is soft.     Tenderness: There is no abdominal tenderness.  Musculoskeletal:        General: No tenderness. Normal range of motion.     Cervical back: Normal range of motion and neck supple.     Right lower leg:  Edema (trace) present.     Left lower leg: Edema (trace) present.  Skin:    General: Skin is warm and dry.  Neurological:     Mental Status: She is alert and oriented to person, place, and time.     Cranial Nerves: No cranial nerve deficit.     Deep Tendon Reflexes: Reflexes are normal and symmetric.  Psychiatric:        Behavior: Behavior normal.        Thought Content: Thought content normal.        Judgment: Judgment normal.     BP (!) 155/88   Pulse 98   Temp (!) 97.3 F (36.3 C) (Temporal)   Ht _0  (1.549 m)   Wt 149 lb 9.6 oz (67.9 kg)   SpO2 97%   BMI 28.27 kg/m       Assessment & Plan:  Garvin Fila Herrington comes in today with chief complaint of Hospitalization Follow-up   Diagnosis and orders addressed:  1. Hospital discharge follow-up - BMP8+EGFR - Vitamin B12 - Iron - CBC with Differential/Platelet  2. Fatigue, unspecified type - BMP8+EGFR - Vitamin B12 - Iron - CBC with Differential/Platelet  3. Chest pain, unspecified type - BMP8+EGFR - Vitamin  B12 - Iron - CBC with Differential/Platelet   Labs pending If chest pain worsen go to ED Health Maintenance reviewed Diet and exercise encouraged  Follow up plan: Keep chronic follow up with PCP  [name], FNP

## 2019-08-11 LAB — CBC WITH DIFFERENTIAL/PLATELET
Basophils Absolute: 0.1 10*3/uL (ref 0.0–0.2)
Basos: 0 %
EOS (ABSOLUTE): 0.1 10*3/uL (ref 0.0–0.4)
Eos: 1 %
Hematocrit: 41.7 % (ref 34.0–46.6)
Hemoglobin: 13.8 g/dL (ref 11.1–15.9)
Immature Grans (Abs): 0.4 10*3/uL — ABNORMAL HIGH (ref 0.0–0.1)
Immature Granulocytes: 2 %
Lymphocytes Absolute: 1.9 10*3/uL (ref 0.7–3.1)
Lymphs: 9 %
MCH: 28.3 pg (ref 26.6–33.0)
MCHC: 33.1 g/dL (ref 31.5–35.7)
MCV: 86 fL (ref 79–97)
Monocytes Absolute: 1.6 10*3/uL — ABNORMAL HIGH (ref 0.1–0.9)
Monocytes: 8 %
Neutrophils Absolute: 16.5 10*3/uL — ABNORMAL HIGH (ref 1.4–7.0)
Neutrophils: 80 %
Platelets: 692 10*3/uL — ABNORMAL HIGH (ref 150–450)
RBC: 4.87 x10E6/uL (ref 3.77–5.28)
RDW: 13 % (ref 11.7–15.4)
WBC: 20.4 10*3/uL (ref 3.4–10.8)

## 2019-08-11 LAB — IRON: Iron: 48 ug/dL (ref 27–139)

## 2019-08-11 LAB — BMP8+EGFR
BUN/Creatinine Ratio: 23 (ref 12–28)
BUN: 29 mg/dL — ABNORMAL HIGH (ref 8–27)
CO2: 26 mmol/L (ref 20–29)
Calcium: 9.7 mg/dL (ref 8.7–10.3)
Chloride: 97 mmol/L (ref 96–106)
Creatinine, Ser: 1.25 mg/dL — ABNORMAL HIGH (ref 0.57–1.00)
GFR calc Af Amer: 51 mL/min/{1.73_m2} — ABNORMAL LOW (ref >59)
GFR calc non Af Amer: 45 mL/min/{1.73_m2} — ABNORMAL LOW (ref >59)
Glucose: 100 mg/dL — ABNORMAL HIGH (ref 65–99)
Potassium: 4.3 mmol/L (ref 3.5–5.2)
Sodium: 139 mmol/L (ref 134–144)

## 2019-08-11 LAB — VITAMIN B12: Vitamin B-12: 1918 pg/mL — ABNORMAL HIGH (ref 232–1245)

## 2019-08-20 ENCOUNTER — Telehealth: Payer: Self-pay | Admitting: Family Medicine

## 2019-08-20 DIAGNOSIS — K449 Diaphragmatic hernia without obstruction or gangrene: Secondary | ICD-10-CM

## 2019-08-20 NOTE — Telephone Encounter (Signed)
  REFERRAL REQUEST Telephone Note 08/20/2019  What type of referral do you need? gastro  Why do you need this referral? Pt was in hospital and referral to gastro but needs referral from pcp  Have you been seen at our office for this problem? NO--pt was at [facility] and referral to [facility] (Advise that they may need an appointment with their PCP before a referral can be done)  Is there a particular doctor or location that you prefer? [place]  Patient notified that referrals can take up to a week or longer to process. If they haven't heard anything within a week they should call back and speak with the referral department.

## 2019-08-20 NOTE — Telephone Encounter (Signed)
Patient was seen 08/10/19 by Alyse Low.  Requesting a referral be placed for [facility].  States she was seen there in 2017 but now she is a new patient.

## 2019-08-20 NOTE — Telephone Encounter (Signed)
Referral placed.

## 2019-08-23 ENCOUNTER — Other Ambulatory Visit: Payer: Self-pay

## 2019-08-23 ENCOUNTER — Other Ambulatory Visit: Payer: Medicare HMO

## 2019-08-23 DIAGNOSIS — D473 Essential (hemorrhagic) thrombocythemia: Secondary | ICD-10-CM | POA: Diagnosis not present

## 2019-08-23 DIAGNOSIS — D638 Anemia in other chronic diseases classified elsewhere: Secondary | ICD-10-CM

## 2019-08-23 DIAGNOSIS — D509 Iron deficiency anemia, unspecified: Secondary | ICD-10-CM | POA: Diagnosis not present

## 2019-08-23 DIAGNOSIS — D75839 Thrombocytosis, unspecified: Secondary | ICD-10-CM

## 2019-08-23 NOTE — Addendum Note (Signed)
Addended by: Earlene Plater on: 08/23/2019 12:15 PM   Modules accepted: Orders

## 2019-08-23 NOTE — Addendum Note (Signed)
Addended by: Earlene Plater on: 08/23/2019 12:16 PM   Modules accepted: Orders

## 2019-08-27 ENCOUNTER — Other Ambulatory Visit: Payer: Self-pay | Admitting: Family Medicine

## 2019-08-28 ENCOUNTER — Telehealth: Payer: Self-pay | Admitting: Family Medicine

## 2019-08-28 ENCOUNTER — Other Ambulatory Visit: Payer: Self-pay | Admitting: Family Medicine

## 2019-08-28 DIAGNOSIS — G8929 Other chronic pain: Secondary | ICD-10-CM

## 2019-08-28 DIAGNOSIS — F411 Generalized anxiety disorder: Secondary | ICD-10-CM

## 2019-08-28 DIAGNOSIS — M545 Low back pain, unspecified: Secondary | ICD-10-CM

## 2019-08-28 NOTE — Telephone Encounter (Signed)
REFERRAL REQUEST Telephone Note  Have you been seen at our office for this problem? yes (Advise that they may need an appointment with their PCP before a referral can be done)  Reason for Referral: pt had appt with gastro dr in [place]  on 10/6. Christy sent in referral to [facility] /appt is 09/09. Pt cancelled the 10/06 appt. The pt got letter from [facility] and said referral was [facility] and that the referral runs out 09/17 Referral discussed with patient: yes Best contact number of patient for referral team:   [phone] Has patient been seen by a specialist for this issue before: na Patient provider preference for referral: na Patient location preference for referral: na   Patient notified that referrals can take up to a week or longer to process. If they haven't heard anything within a week they should call back and speak with the referral department.

## 2019-08-30 ENCOUNTER — Other Ambulatory Visit: Payer: Self-pay

## 2019-08-30 ENCOUNTER — Inpatient Hospital Stay (HOSPITAL_COMMUNITY): Payer: Medicare HMO | Attending: Nurse Practitioner | Admitting: Nurse Practitioner

## 2019-08-30 DIAGNOSIS — N189 Chronic kidney disease, unspecified: Secondary | ICD-10-CM | POA: Diagnosis not present

## 2019-08-30 DIAGNOSIS — E213 Hyperparathyroidism, unspecified: Secondary | ICD-10-CM

## 2019-08-30 DIAGNOSIS — E538 Deficiency of other specified B group vitamins: Secondary | ICD-10-CM | POA: Diagnosis not present

## 2019-08-30 DIAGNOSIS — D631 Anemia in chronic kidney disease: Secondary | ICD-10-CM | POA: Diagnosis not present

## 2019-08-30 DIAGNOSIS — D473 Essential (hemorrhagic) thrombocythemia: Secondary | ICD-10-CM | POA: Diagnosis not present

## 2019-08-30 DIAGNOSIS — D638 Anemia in other chronic diseases classified elsewhere: Secondary | ICD-10-CM

## 2019-08-30 DIAGNOSIS — E611 Iron deficiency: Secondary | ICD-10-CM | POA: Diagnosis not present

## 2019-08-30 DIAGNOSIS — R7989 Other specified abnormal findings of blood chemistry: Secondary | ICD-10-CM | POA: Diagnosis not present

## 2019-08-30 LAB — COMPREHENSIVE METABOLIC PANEL
ALT: 35 IU/L — ABNORMAL HIGH (ref 0–32)
AST: 23 IU/L (ref 0–40)
Albumin/Globulin Ratio: 1.8 (ref 1.2–2.2)
Albumin: 4.2 g/dL (ref 3.8–4.8)
Alkaline Phosphatase: 212 IU/L — ABNORMAL HIGH (ref 48–121)
BUN/Creatinine Ratio: 15 (ref 12–28)
BUN: 17 mg/dL (ref 8–27)
Bilirubin Total: 0.6 mg/dL (ref 0.0–1.2)
CO2: 23 mmol/L (ref 20–29)
Calcium: 9.3 mg/dL (ref 8.7–10.3)
Chloride: 98 mmol/L (ref 96–106)
Creatinine, Ser: 1.17 mg/dL — ABNORMAL HIGH (ref 0.57–1.00)
GFR calc Af Amer: 56 mL/min/{1.73_m2} — ABNORMAL LOW (ref >59)
GFR calc non Af Amer: 48 mL/min/{1.73_m2} — ABNORMAL LOW (ref >59)
Globulin, Total: 2.3 g/dL (ref 1.5–4.5)
Glucose: 119 mg/dL — ABNORMAL HIGH (ref 65–99)
Potassium: 4.7 mmol/L (ref 3.5–5.2)
Sodium: 138 mmol/L (ref 134–144)
Total Protein: 6.5 g/dL (ref 6.0–8.5)

## 2019-08-30 LAB — CBC WITH DIFFERENTIAL/PLATELET
Basophils Absolute: 0 10*3/uL (ref 0.0–0.2)
Basos: 0 %
EOS (ABSOLUTE): 0.4 10*3/uL (ref 0.0–0.4)
Eos: 5 %
Hematocrit: 34.9 % (ref 34.0–46.6)
Hemoglobin: 11.5 g/dL (ref 11.1–15.9)
Immature Grans (Abs): 0 10*3/uL (ref 0.0–0.1)
Immature Granulocytes: 1 %
Lymphocytes Absolute: 1.6 10*3/uL (ref 0.7–3.1)
Lymphs: 20 %
MCH: 28.7 pg (ref 26.6–33.0)
MCHC: 33 g/dL (ref 31.5–35.7)
MCV: 87 fL (ref 79–97)
Monocytes Absolute: 0.9 10*3/uL (ref 0.1–0.9)
Monocytes: 11 %
Neutrophils Absolute: 5.2 10*3/uL (ref 1.4–7.0)
Neutrophils: 63 %
Platelets: 470 10*3/uL — ABNORMAL HIGH (ref 150–450)
RBC: 4.01 x10E6/uL (ref 3.77–5.28)
RDW: 13.1 % (ref 11.7–15.4)
WBC: 8.1 10*3/uL (ref 3.4–10.8)

## 2019-08-30 LAB — BCR-ABL1 FISH
Cells Analyzed: 200
Cells Counted: 200

## 2019-08-30 LAB — IRON AND TIBC
Iron Saturation: 21 % (ref 15–55)
Iron: 62 ug/dL (ref 27–139)
Total Iron Binding Capacity: 293 ug/dL (ref 250–450)
UIBC: 231 ug/dL (ref 118–369)

## 2019-08-30 LAB — FERRITIN: Ferritin: 136 ng/mL (ref 15–150)

## 2019-08-30 NOTE — Progress Notes (Signed)
[address] [place], [place] [address]   CLINIC:  Medical Oncology/Hematology  PCP:  Dettinger, Fransisca Kaufmann, MD [place] [address] [phone]   REASON FOR VISIT: Follow-up for normocytic anemia   CURRENT THERAPY: Intermittent iron infusions  INTERVAL HISTORY:  Maria Moss 69 y.o. female returns for routine follow-up for normocytic anemia.  Patient reports she is doing well since her last visit.  She denies any bright red bleeding per rectum or melena.  She denies any easy bruising or bleeding.  She does report her fatigue levels have increased slightly. Denies any nausea, vomiting, or diarrhea. Denies any new pains. Had not noticed any recent bleeding such as epistaxis, hematuria or hematochezia. Denies recent chest pain on exertion, shortness of breath on minimal exertion, pre-syncopal episodes, or palpitations. Denies any numbness or tingling in hands or feet. Denies any recent fevers, infections, or recent hospitalizations. Patient reports appetite at 75% and energy level at 50%.  She is eating well maintain her weight at this time.     REVIEW OF SYSTEMS:  Review of Systems  Constitutional: Positive for fatigue.  Respiratory: Positive for shortness of breath.   Cardiovascular: Positive for leg swelling.  Gastrointestinal: Positive for diarrhea.  All other systems reviewed and are negative.    PAST MEDICAL/SURGICAL HISTORY:  Past Medical History:  Diagnosis Date  . Anemia    iron deficiency  . Ankle fracture, left 2006  . Diverticula, intestine    Mild case.  . Fibromyalgia    Dr. Justine Null Dx in [place] years ago  no meds   . GERD (gastroesophageal reflux disease)   . History of hiatal hernia   . Hyperlipidemia   . Hyperparathyroidism ([place])   . Hypertension    Dx age 11.   . Stroke [facility])    Questionable stoke when born or polio not sure pt. has right side weakness toes right foot turns outward and toes curled up some    Past  Surgical History:  Procedure Laterality Date  . ANKLE SURGERY Left    Broken.  Has screws and metal plate.  . CHOLECYSTECTOMY    . PARATHYROIDECTOMY Right 05/29/2019   Procedure: RIGHT INFERIOR PARATHYROIDECTOMY;  Surgeon: Armandina Gemma, MD;  Location: [facility];  Service: General;  Laterality: Right;  . ROBOTIC ADRENALECTOMY Right 05/31/2018   Procedure: XI ROBOTIC RIGHT ADRENALECTOMY;  Surgeon: Ralene Ok, MD;  Location: [facility];  Service: General;  Laterality: Right;  . THYROID LOBECTOMY Right 05/29/2019   Procedure: RIGHT THYROID LOBECTOMY;  Surgeon: Armandina Gemma, MD;  Location: [facility];  Service: General;  Laterality: Right;     SOCIAL HISTORY:  Social History   Socioeconomic History  . Marital status: Married    Spouse name: Not on file  . Number of children: Not on file  . Years of education: Not on file  . Highest education level: Not on file  Occupational History  . Not on file  Tobacco Use  . Smoking status: Never Smoker  . Smokeless tobacco: Never Used  Vaping Use  . Vaping Use: Never used  Substance and Sexual Activity  . Alcohol use: No  . Drug use: No  . Sexual activity: Not Currently  Other Topics Concern  . Not on file  Social History Narrative  . Not on file   Social Determinants of Health   Financial Resource Strain:   . Difficulty of Paying Living Expenses: Not on file  Food Insecurity:   .  Worried About Charity fundraiser in the Last Year: Not on file  . Ran Out of Food in the Last Year: Not on file  Transportation Needs:   . Lack of Transportation (Medical): Not on file  . Lack of Transportation (Non-Medical): Not on file  Physical Activity:   . Days of Exercise per Week: Not on file  . Minutes of Exercise per Session: Not on file  Stress:   . Feeling of Stress : Not on file  Social Connections:   . Frequency of Communication with Friends and Family: Not on file  . Frequency of Social Gatherings with Friends and Family: Not on file  .  Attends Religious Services: Not on file  . Active Member of Clubs or Organizations: Not on file  . Attends Archivist Meetings: Not on file  . Marital Status: Not on file  Intimate Partner Violence:   . Fear of Current or Ex-Partner: Not on file  . Emotionally Abused: Not on file  . Physically Abused: Not on file  . Sexually Abused: Not on file    FAMILY HISTORY:  Family History  Problem Relation Age of Onset  . Heart disease Mother        CHF  . Cancer Mother        Uterine  . Osteoporosis Mother   . COPD Sister   . Asthma Sister   . Migraines Sister     CURRENT MEDICATIONS:  Outpatient Encounter Medications as of 08/30/2019  Medication Sig Note  . amLODipine (NORVASC) 5 MG tablet TAKE 1 TABLET EVERY DAY   . cetirizine (ZYRTEC) 10 MG tablet Take 10 mg by mouth daily.   . Cholecalciferol (VITAMIN D-1000 MAX ST) 1000 units tablet Take 3,000 Units by mouth daily.    . diclofenac Sodium (VOLTAREN) 1 % GEL Apply topically.   . DULoxetine (CYMBALTA) 60 MG capsule TAKE 1 CAPSULE EVERY DAY   . famotidine (PEPCID) 10 MG tablet Take 10 mg by mouth daily.    . famotidine (PEPCID) 40 MG tablet    . fluticasone (FLONASE) 50 MCG/ACT nasal spray USE 2 SPRAYS IN EACH NOSTRIL EVERY DAY   . furosemide (LASIX) 20 MG tablet Take 1 tablet (20 mg total) by mouth daily.   . hyoscyamine (ANASPAZ) 0.125 MG TBDP disintergrating tablet Take 1 tablet (0.125 mg total) by mouth every 6 (six) hours as needed.   . Loperamide HCl (IMODIUM PO) Take by mouth as needed.   . loratadine (CLARITIN) 10 MG tablet Take 10 mg by mouth daily. 04/16/2019: Alternates between zyrtec and claritin  . meloxicam (MOBIC) 15 MG tablet TAKE 1 TABLET EVERY DAY   . NON FORMULARY Take 1 tablet by mouth daily. Forebrain otc-for memory    . Omega-3 Fatty Acids (FISH OIL) 1000 MG CAPS Take 1,000 mg by mouth daily.    . ondansetron (ZOFRAN) 4 MG tablet Take 1 tablet (4 mg total) by mouth every 8 (eight) hours as needed for  nausea or vomiting.   Marland Kitchen OVER THE COUNTER MEDICATION Take 250 mg by mouth daily. Hemp oil/ 1 drop under tongue   . OVER THE COUNTER MEDICATION Apply 1 application topically in the morning and at bedtime. Roll on/ Hemp oil 1000 mg   . pantoprazole (PROTONIX) 40 MG tablet Take 1 tablet (40 mg total) by mouth daily.   . Probiotic Product (PROBIOTIC & ACIDOPHILUS EX ST PO) Take 1 capsule by mouth at bedtime.    . traMADol (ULTRAM) 50 MG  tablet Take 1 tablet (50 mg total) by mouth every 8 (eight) hours as needed (mild pain).   . triamcinolone cream (KENALOG) 0.1 %    . [DISCONTINUED] vitamin B-12 (CYANOCOBALAMIN) 1000 MCG tablet Take 1,000 mcg by mouth daily.    No facility-administered encounter medications on file as of 08/30/2019.    ALLERGIES:  Allergies  Allergen Reactions  . Codeine Nausea And Vomiting  . Sulfa Antibiotics     Hair loss, yellowed skin  . Doxycycline Hyclate Itching     PHYSICAL EXAM:  ECOG Performance status: 1  Vitals:   08/30/19 1030  BP: 137/75  Pulse: 82  Resp: 18  Temp: (!) 97.3 F (36.3 C)  SpO2: 100%   Filed Weights   08/30/19 1030  Weight: 151 lb 9.6 oz (68.8 kg)   Physical Exam Constitutional:      Appearance: Normal appearance. She is normal weight.  Cardiovascular:     Rate and Rhythm: Normal rate and regular rhythm.     Heart sounds: Normal heart sounds.  Pulmonary:     Effort: Pulmonary effort is normal.     Breath sounds: Normal breath sounds.  Abdominal:     General: Bowel sounds are normal.     Palpations: Abdomen is soft.  Musculoskeletal:        General: Normal range of motion.  Skin:    General: Skin is warm.  Neurological:     Mental Status: She is alert and oriented to person, place, and time. Mental status is at baseline.  Psychiatric:        Mood and Affect: Mood normal.        Behavior: Behavior normal.        Thought Content: Thought content normal.        Judgment: Judgment normal.      LABORATORY DATA:  I  have reviewed the labs as listed.  CBC    Component Value Date/Time   WBC 8.1 08/23/2019 1221   WBC 10.9 (H) 05/25/2019 1428   RBC 4.01 08/23/2019 1221   RBC 4.28 05/25/2019 1428   HGB 11.5 08/23/2019 1221   HCT 34.9 08/23/2019 1221   PLT 470 (H) 08/23/2019 1221   MCV 87 08/23/2019 1221   MCH 28.7 08/23/2019 1221   MCH 29.0 05/25/2019 1428   MCHC 33.0 08/23/2019 1221   MCHC 31.6 05/25/2019 1428   RDW 13.1 08/23/2019 1221   LYMPHSABS 1.6 08/23/2019 1221   EOSABS 0.4 08/23/2019 1221   BASOSABS 0.0 08/23/2019 1221   CMP Latest Ref Rng & Units 08/23/2019 08/10/2019 07/20/2019  Glucose 65 - 99 mg/dL 119(H) 100(H) -  BUN 8 - 27 mg/dL 17 29(H) -  Creatinine 0.57 - 1.00 mg/dL 1.17(H) 1.25(H) -  Sodium 134 - 144 mmol/L 138 139 -  Potassium 3.5 - 5.2 mmol/L 4.7 4.3 -  Chloride 96 - 106 mmol/L 98 97 -  CO2 20 - 29 mmol/L 23 26 -  Calcium 8.7 - 10.3 mg/dL 9.3 9.7 9.6  Total Protein 6.0 - 8.5 g/dL 6.5 - -  Total Bilirubin 0.0 - 1.2 mg/dL 0.6 - -  Alkaline Phos 48 - 121 IU/L 212(H) - -  AST 0 - 40 IU/L 23 - -  ALT 0 - 32 IU/L 35(H) - -    All questions were answered to patient's stated satisfaction. Encouraged patient to call with any new concerns or questions before his next visit to the cancer center and we can certain see him sooner, if needed.  ASSESSMENT & PLAN:  Anemia of chronic disease 1.  Normocytic anemia:- -This is a combination of CKD and iron deficiency state. -Last Feraheme infusion was on 12/26/2010 and 01/02/2019. -Work-up labs included Jak 2 V6 27 mutation testing was negative.  BCR/ABL by FISH is pending. -Labs done on 08/23/2019 showed hemoglobin 11.5, ferritin 136, percent saturation 21, platelets 470, WBC 8.1 -Patient is wanting to hold off on iron infusions at this time. -We will recheck her again in 3 months.  2.  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 -Jak 2 V6 22F and reflex mutation testing was negative. -She  received 2 iron infusions and her platelet count improved. -BCR/ABL by FISH is pending -Labs done on 08/23/2019 showed platelet count of 470  3.  Mild hypercalcemia: -She has hypercalcemia due to hyperparathyroidism. -She had surgery on 05/29/2019.  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  She did not need radiation.  She is not on any thyroid replacement at this time. -Labs done on 08/23/2019 showed calcium level 9.3  4.  Vitamin B12 deficiency: -Latest B12 was normal. -She is continue taking B12 1 mg daily.     Orders placed this encounter:  Orders Placed This Encounter  Procedures  . CBC with Differential/Platelet  . Comprehensive metabolic panel  . Ferritin  . Iron and TIBC  . Lactate dehydrogenase  . Vitamin B12  . VITAMIN D 25 Hydroxy (Vit-D Deficiency, Fractures)      Francene Finders, FNP-C [place] [phone]

## 2019-08-30 NOTE — Assessment & Plan Note (Addendum)
1.  Normocytic anemia:- -This is a combination of CKD and iron deficiency state. -Last Feraheme infusion was on 12/26/2010 and 01/02/2019. -Work-up labs included Jak 2 V6 27 mutation testing was negative.  BCR/ABL by FISH is pending. -Labs done on 08/23/2019 showed hemoglobin 11.5, ferritin 136, percent saturation 21, platelets 470, WBC 8.1 -Patient is wanting to hold off on iron infusions at this time. -We will recheck her again in 3 months.  2.  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 -Jak 2 V6 42F and reflex mutation testing was negative. -She received 2 iron infusions and her platelet count improved. -BCR/ABL by FISH is pending -Labs done on 08/23/2019 showed platelet count of 470  3.  Mild hypercalcemia: -She has hypercalcemia due to hyperparathyroidism. -She had surgery on 05/29/2019.  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  She did not need radiation.  She is not on any thyroid replacement at this time. -Labs done on 08/23/2019 showed calcium level 9.3  4.  Vitamin B12 deficiency: -Latest B12 was normal. -She is continue taking B12 1 mg daily.

## 2019-09-13 DIAGNOSIS — K449 Diaphragmatic hernia without obstruction or gangrene: Secondary | ICD-10-CM | POA: Diagnosis not present

## 2019-09-13 DIAGNOSIS — K219 Gastro-esophageal reflux disease without esophagitis: Secondary | ICD-10-CM | POA: Diagnosis not present

## 2019-09-13 DIAGNOSIS — Z791 Long term (current) use of non-steroidal anti-inflammatories (NSAID): Secondary | ICD-10-CM | POA: Diagnosis not present

## 2019-09-17 ENCOUNTER — Other Ambulatory Visit: Payer: Self-pay

## 2019-09-17 ENCOUNTER — Other Ambulatory Visit: Payer: Medicare HMO

## 2019-09-17 DIAGNOSIS — E890 Postprocedural hypothyroidism: Secondary | ICD-10-CM

## 2019-09-17 DIAGNOSIS — E210 Primary hyperparathyroidism: Secondary | ICD-10-CM | POA: Diagnosis not present

## 2019-09-17 DIAGNOSIS — Z9009 Acquired absence of other part of head and neck: Secondary | ICD-10-CM | POA: Diagnosis not present

## 2019-09-17 NOTE — Addendum Note (Signed)
Addended by: Earlene Plater on: 09/17/2019 11:24 AM   Modules accepted: Orders

## 2019-09-18 LAB — T3, FREE: T3, Free: 2.7 pg/mL (ref 2.0–4.4)

## 2019-09-18 LAB — TSH: TSH: 2.52 u[IU]/mL (ref 0.450–4.500)

## 2019-09-18 LAB — T4, FREE: Free T4: 1.11 ng/dL (ref 0.82–1.77)

## 2019-09-20 DIAGNOSIS — K449 Diaphragmatic hernia without obstruction or gangrene: Secondary | ICD-10-CM | POA: Diagnosis not present

## 2019-09-20 DIAGNOSIS — K317 Polyp of stomach and duodenum: Secondary | ICD-10-CM | POA: Diagnosis not present

## 2019-09-20 DIAGNOSIS — K3189 Other diseases of stomach and duodenum: Secondary | ICD-10-CM | POA: Diagnosis not present

## 2019-09-20 DIAGNOSIS — K219 Gastro-esophageal reflux disease without esophagitis: Secondary | ICD-10-CM | POA: Diagnosis not present

## 2019-09-21 ENCOUNTER — Other Ambulatory Visit: Payer: Self-pay | Admitting: Family Medicine

## 2019-09-21 DIAGNOSIS — I10 Essential (primary) hypertension: Secondary | ICD-10-CM

## 2019-10-08 DIAGNOSIS — K219 Gastro-esophageal reflux disease without esophagitis: Secondary | ICD-10-CM | POA: Diagnosis not present

## 2019-10-08 DIAGNOSIS — R0789 Other chest pain: Secondary | ICD-10-CM | POA: Diagnosis not present

## 2019-10-08 DIAGNOSIS — K449 Diaphragmatic hernia without obstruction or gangrene: Secondary | ICD-10-CM | POA: Diagnosis not present

## 2019-10-17 DIAGNOSIS — K449 Diaphragmatic hernia without obstruction or gangrene: Secondary | ICD-10-CM | POA: Diagnosis not present

## 2019-10-18 ENCOUNTER — Telehealth (HOSPITAL_COMMUNITY): Payer: Self-pay | Admitting: *Deleted

## 2019-10-18 ENCOUNTER — Encounter: Payer: Self-pay | Admitting: Family Medicine

## 2019-10-18 ENCOUNTER — Other Ambulatory Visit: Payer: Self-pay

## 2019-10-18 ENCOUNTER — Ambulatory Visit (INDEPENDENT_AMBULATORY_CARE_PROVIDER_SITE_OTHER): Payer: Medicare HMO | Admitting: Family Medicine

## 2019-10-18 ENCOUNTER — Other Ambulatory Visit: Payer: Self-pay | Admitting: Family Medicine

## 2019-10-18 VITALS — BP 138/77 | HR 80 | Temp 97.0°F | Ht 61.0 in | Wt 150.2 lb

## 2019-10-18 DIAGNOSIS — M5136 Other intervertebral disc degeneration, lumbar region: Secondary | ICD-10-CM | POA: Diagnosis not present

## 2019-10-18 DIAGNOSIS — E782 Mixed hyperlipidemia: Secondary | ICD-10-CM

## 2019-10-18 DIAGNOSIS — I10 Essential (primary) hypertension: Secondary | ICD-10-CM | POA: Diagnosis not present

## 2019-10-18 DIAGNOSIS — K219 Gastro-esophageal reflux disease without esophagitis: Secondary | ICD-10-CM

## 2019-10-18 MED ORDER — TRAMADOL HCL 50 MG PO TABS: 50.0000 mg | ORAL_TABLET | Freq: Three times a day (TID) | ORAL | 2 refills | Status: DC | PRN

## 2019-10-18 NOTE — Telephone Encounter (Signed)
Pt called into the clinic stating that the former NP Francene Finders stated pt could get an iron infusion before her scheduled surgery. I spoke with Dr. Delton Coombes and he started that pt was okay to have an infusion before her surgery. Scheduler called and spoke with pt to schedule pt's iron infusions.

## 2019-10-18 NOTE — Progress Notes (Signed)
BP 138/77   Pulse 80   Temp (!) 97 F (36.1 C)   Ht 5\' 1"  (1.549 m)   Wt 150 lb 4 oz (68.2 kg)   SpO2 100%   BMI 28.39 kg/m    Subjective:   Patient ID: Maria Moss, female    DOB: 14-Sep-1950, 69 y.o.   MRN: [id number]  HPI: Maria Moss is a 69 y.o. female presenting on 10/18/2019 for Medical Management of Chronic Issues, Hypertension, and Hyperlipidemia   HPI Hyperlipidemia Patient is coming in for recheck of his hyperlipidemia. The patient is currently taking fish oil. They deny any issues with myalgias or history of liver damage from it. They deny any focal numbness or weakness or chest pain.   Hypertension Patient is currently on amlodipine and furosemide, and their blood pressure today is 138/77. Patient denies any lightheadedness or dizziness. Patient denies headaches, blurred vision, chest pains, shortness of breath, or weakness. Denies any side effects from medication and is content with current medication.   Pain assessment: Cause of pain-right hip pain and knee pain and lower back pain and degenerative disc disease Pain location-right hip and lower back Pain on scale of 1-10- 4 Frequency-some days but not every day What increases pain-movement and walking and standing for long periods What makes pain Better-tramadol helps and resting helps Effects on ADL -limits some walking and now is having to use a cane. Any change in general medical condition-none currently  Current opioids rx-tramadol 50 mg 3 times daily as needed. # meds rx-90 Effectiveness of current meds-working well Adverse reactions from pain meds-none Morphine equivalent-15  Pill count performed-No Last drug screen -10/18/2018 ( high risk q97m, moderate risk q81m, low risk yearly ) Urine drug screen today- No Was the NCCSR reviewed-yes  If yes were their any concerning findings? -None  No flowsheet data found.   Pain contract signed on: 10/18/2018  Relevant past medical, surgical, family and  social history reviewed and updated as indicated. Interim medical history since our last visit reviewed. Allergies and medications reviewed and updated.  Review of Systems  Constitutional: Negative for chills and fever.  Eyes: Negative for redness and visual disturbance.  Respiratory: Negative for chest tightness and shortness of breath.   Cardiovascular: Negative for chest pain and leg swelling.  Musculoskeletal: Positive for arthralgias and back pain. Negative for gait problem.  Skin: Negative for rash.  Neurological: Negative for light-headedness and headaches.  Psychiatric/Behavioral: Negative for agitation and behavioral problems.  All other systems reviewed and are negative.   Per HPI unless specifically indicated above   Allergies as of 10/18/2019      Reactions   Codeine Nausea And Vomiting   Sulfa Antibiotics    Hair loss, yellowed skin   Doxycycline Hyclate Itching      Medication List       Accurate as of October 18, 2019 10:19 AM. If you have any questions, ask your nurse or doctor.        STOP taking these medications   NON FORMULARY Stopped by: Elige Radon Dakoda [place], MD     TAKE these medications   amLODipine 5 MG tablet Commonly known as: NORVASC TAKE 1 TABLET EVERY DAY   cetirizine 10 MG tablet Commonly known as: ZYRTEC Take 10 mg by mouth daily.   Claritin 10 MG tablet Generic drug: loratadine Take 10 mg by mouth daily.   diclofenac Sodium 1 % Gel Commonly known as: VOLTAREN Apply topically.   DULoxetine 60 MG  capsule Commonly known as: CYMBALTA TAKE 1 CAPSULE EVERY DAY   famotidine 10 MG tablet Commonly known as: PEPCID Take 10 mg by mouth daily.   famotidine 40 MG tablet Commonly known as: PEPCID   Fish Oil 1000 MG Caps Take 1,000 mg by mouth daily.   fluticasone 50 MCG/ACT nasal spray Commonly known as: FLONASE USE 2 SPRAYS IN EACH NOSTRIL EVERY DAY   furosemide 20 MG tablet Commonly known as: LASIX Take 1 tablet (20 mg  total) by mouth daily.   hyoscyamine 0.125 MG Tbdp disintergrating tablet Commonly known as: ANASPAZ Take 1 tablet (0.125 mg total) by mouth every 6 (six) hours as needed.   IMODIUM PO Take by mouth as needed.   meloxicam 15 MG tablet Commonly known as: MOBIC TAKE 1 TABLET EVERY DAY   ondansetron 4 MG tablet Commonly known as: Zofran Take 1 tablet (4 mg total) by mouth every 8 (eight) hours as needed for nausea or vomiting.   OVER THE COUNTER MEDICATION Take 250 mg by mouth daily. Hemp oil/ 1 drop under tongue   OVER THE COUNTER MEDICATION Apply 1 application topically in the morning and at bedtime. Roll on/ Hemp oil 1000 mg   pantoprazole 40 MG tablet Commonly known as: PROTONIX Take 1 tablet (40 mg total) by mouth daily.   PROBIOTIC & ACIDOPHILUS EX ST PO Take 1 capsule by mouth at bedtime.   traMADol 50 MG tablet Commonly known as: ULTRAM Take 1 tablet (50 mg total) by mouth every 8 (eight) hours as needed (mild pain).   triamcinolone cream 0.1 % Commonly known as: KENALOG   Vitamin D-1000 Max St 25 MCG (1000 UT) tablet Generic drug: Cholecalciferol Take 3,000 Units by mouth daily.        Objective:   BP 138/77   Pulse 80   Temp (!) 97 F (36.1 C)   Ht 5\' 1"  (1.549 m)   Wt 150 lb 4 oz (68.2 kg)   SpO2 100%   BMI 28.39 kg/m   Wt Readings from Last 3 Encounters:  10/18/19 150 lb 4 oz (68.2 kg)  08/30/19 151 lb 9.6 oz (68.8 kg)  08/10/19 149 lb 9.6 oz (67.9 kg)    Physical Exam Vitals and nursing note reviewed.  Constitutional:      General: She is not in acute distress.    Appearance: She is well-developed. She is not diaphoretic.  Eyes:     Conjunctiva/sclera: Conjunctivae normal.  Cardiovascular:     Rate and Rhythm: Normal rate and regular rhythm.     Heart sounds: Normal heart sounds. No murmur heard.   Pulmonary:     Effort: Pulmonary effort is normal. No respiratory distress.     Breath sounds: Normal breath sounds. No wheezing.   Skin:    General: Skin is warm and dry.     Findings: No rash.  Neurological:     Mental Status: She is alert and oriented to person, place, and time.     Coordination: Coordination normal.  Psychiatric:        Behavior: Behavior normal.       Assessment & Plan:   Problem List Items Addressed This Visit      Cardiovascular and Mediastinum   Hypertension - Primary (Chronic)     Musculoskeletal and Integument   Degenerative disc disease, lumbar (Chronic)   Relevant Medications   traMADol (ULTRAM) 50 MG tablet     Other   Hyperlipemia (Chronic)      Continue  current medication, no changes, seems to be doing well with blood pressure and cholesterol we will check in the future. Follow up plan: Return in about 3 months (around 01/18/2020), or if symptoms worsen or fail to improve, for Hypertension and back pain and cholesterol.  Counseling provided for all of the vaccine components No orders of the defined types were placed in this encounter.   Arville Care, MD [facility] 10/18/2019, 10:19 AM

## 2019-10-22 DIAGNOSIS — K449 Diaphragmatic hernia without obstruction or gangrene: Secondary | ICD-10-CM | POA: Diagnosis not present

## 2019-10-22 DIAGNOSIS — K219 Gastro-esophageal reflux disease without esophagitis: Secondary | ICD-10-CM | POA: Diagnosis not present

## 2019-10-22 MED ORDER — PANTOPRAZOLE SODIUM 40 MG PO TBEC: 40.0000 mg | DELAYED_RELEASE_TABLET | Freq: Every day | ORAL | 0 refills | Status: DC

## 2019-10-22 NOTE — Telephone Encounter (Signed)
E-prescribe down. resent 

## 2019-10-22 NOTE — Addendum Note (Signed)
Addended by: Antonietta Barcelona D on: 10/22/2019 09:08 AM   Modules accepted: Orders

## 2019-10-26 ENCOUNTER — Other Ambulatory Visit: Payer: Self-pay

## 2019-10-26 ENCOUNTER — Inpatient Hospital Stay (HOSPITAL_COMMUNITY): Payer: Medicare HMO | Attending: Nurse Practitioner

## 2019-10-26 ENCOUNTER — Encounter (HOSPITAL_COMMUNITY): Payer: Self-pay

## 2019-10-26 VITALS — BP 159/81 | HR 90 | Temp 97.1°F | Resp 17

## 2019-10-26 DIAGNOSIS — D631 Anemia in chronic kidney disease: Secondary | ICD-10-CM | POA: Diagnosis not present

## 2019-10-26 DIAGNOSIS — N189 Chronic kidney disease, unspecified: Secondary | ICD-10-CM | POA: Insufficient documentation

## 2019-10-26 DIAGNOSIS — Z79899 Other long term (current) drug therapy: Secondary | ICD-10-CM | POA: Insufficient documentation

## 2019-10-26 DIAGNOSIS — D638 Anemia in other chronic diseases classified elsewhere: Secondary | ICD-10-CM

## 2019-10-26 MED ORDER — SODIUM CHLORIDE 0.9 % IV SOLN
510.0000 mg | Freq: Once | INTRAVENOUS | Status: AC
  Administered 2019-10-26: 510 mg via INTRAVENOUS
  Filled 2019-10-26: qty 510

## 2019-10-26 MED ORDER — SODIUM CHLORIDE 0.9 % IV SOLN: Freq: Once | INTRAVENOUS | Status: AC

## 2019-10-26 NOTE — Patient Instructions (Signed)
[facility]  Discharge Instructions:   _______________________________________________________________  Thank you for choosing [facility] to provide your oncology and hematology care.  To afford each patient quality time with our providers, please arrive at least 15 minutes before your scheduled appointment.  You need to re-schedule your appointment if you arrive 10 or more minutes late.  We strive to give you quality time with our providers, and arriving late affects you and other patients whose appointments are after yours.  Also, if you no show three or more times for appointments you may be dismissed from the clinic.  Again, thank you for choosing [facility]. Our hope is that these requests will allow you access to exceptional care and in a timely manner. _______________________________________________________________  If you have questions after your visit, please contact our office at [phone] between the hours of 8:30 a.m. and 5:00 p.m. Voicemails left after 4:30 p.m. will not be returned until the following business day. _______________________________________________________________  For prescription refill requests, have your pharmacy contact our office. _______________________________________________________________  Recommendations made by the consultant and any test results will be sent to your referring physician. _______________________________________________________________ 

## 2019-10-26 NOTE — Progress Notes (Signed)
Patient presents today for Feraheme infusion. MAR reviewed and updated. Vital signs stable. Patient has no complaints of any changes since her last visit.   Treatment given today per MD orders. Tolerated infusion without adverse affects. Vital signs stable. No complaints at this time. Discharged from clinic ambulatory in stable condition. Alert and oriented x 3. F/U with [facility] as scheduled.

## 2019-10-31 ENCOUNTER — Other Ambulatory Visit: Payer: Self-pay

## 2019-10-31 MED ORDER — FUROSEMIDE 20 MG PO TABS
20.0000 mg | ORAL_TABLET | Freq: Every day | ORAL | 3 refills | Status: DC
Start: 2019-10-31 — End: 2020-09-04

## 2019-11-02 ENCOUNTER — Other Ambulatory Visit: Payer: Self-pay

## 2019-11-02 ENCOUNTER — Inpatient Hospital Stay (HOSPITAL_COMMUNITY): Payer: Medicare HMO

## 2019-11-02 ENCOUNTER — Encounter (HOSPITAL_COMMUNITY): Payer: Self-pay

## 2019-11-02 VITALS — BP 130/69 | HR 82 | Temp 97.2°F | Resp 18

## 2019-11-02 DIAGNOSIS — D638 Anemia in other chronic diseases classified elsewhere: Secondary | ICD-10-CM

## 2019-11-02 DIAGNOSIS — Z79899 Other long term (current) drug therapy: Secondary | ICD-10-CM | POA: Diagnosis not present

## 2019-11-02 DIAGNOSIS — N189 Chronic kidney disease, unspecified: Secondary | ICD-10-CM | POA: Diagnosis not present

## 2019-11-02 DIAGNOSIS — D631 Anemia in chronic kidney disease: Secondary | ICD-10-CM | POA: Diagnosis not present

## 2019-11-02 MED ORDER — SODIUM CHLORIDE 0.9 % IV SOLN: Freq: Once | INTRAVENOUS | Status: AC

## 2019-11-02 MED ORDER — SODIUM CHLORIDE 0.9 % IV SOLN
510.0000 mg | Freq: Once | INTRAVENOUS | Status: AC
  Administered 2019-11-02: 510 mg via INTRAVENOUS
  Filled 2019-11-02: qty 510

## 2019-11-02 NOTE — Progress Notes (Signed)
Tolerated infusion w/o adverse reaction.  Alert, in no distress.  VSS.  Discharged ambulatory in stable condition.  

## 2019-11-05 ENCOUNTER — Other Ambulatory Visit: Payer: Self-pay | Admitting: Family Medicine

## 2019-11-05 DIAGNOSIS — F411 Generalized anxiety disorder: Secondary | ICD-10-CM

## 2019-11-05 DIAGNOSIS — G8929 Other chronic pain: Secondary | ICD-10-CM

## 2019-11-05 DIAGNOSIS — M545 Low back pain, unspecified: Secondary | ICD-10-CM

## 2019-11-16 DIAGNOSIS — R350 Frequency of micturition: Secondary | ICD-10-CM | POA: Diagnosis not present

## 2019-11-16 DIAGNOSIS — J0100 Acute maxillary sinusitis, unspecified: Secondary | ICD-10-CM | POA: Diagnosis not present

## 2019-11-16 DIAGNOSIS — N390 Urinary tract infection, site not specified: Secondary | ICD-10-CM | POA: Diagnosis not present

## 2019-11-26 ENCOUNTER — Other Ambulatory Visit (HOSPITAL_COMMUNITY): Payer: Medicare HMO

## 2019-11-26 DIAGNOSIS — E213 Hyperparathyroidism, unspecified: Secondary | ICD-10-CM | POA: Diagnosis not present

## 2019-11-26 DIAGNOSIS — K449 Diaphragmatic hernia without obstruction or gangrene: Secondary | ICD-10-CM | POA: Diagnosis not present

## 2019-11-26 DIAGNOSIS — E210 Primary hyperparathyroidism: Secondary | ICD-10-CM | POA: Diagnosis not present

## 2019-11-26 DIAGNOSIS — Z79899 Other long term (current) drug therapy: Secondary | ICD-10-CM | POA: Diagnosis not present

## 2019-11-26 DIAGNOSIS — F411 Generalized anxiety disorder: Secondary | ICD-10-CM | POA: Diagnosis not present

## 2019-11-26 DIAGNOSIS — J982 Interstitial emphysema: Secondary | ICD-10-CM | POA: Diagnosis not present

## 2019-11-26 DIAGNOSIS — K219 Gastro-esophageal reflux disease without esophagitis: Secondary | ICD-10-CM | POA: Diagnosis not present

## 2019-11-26 DIAGNOSIS — D638 Anemia in other chronic diseases classified elsewhere: Secondary | ICD-10-CM | POA: Diagnosis not present

## 2019-11-26 DIAGNOSIS — E785 Hyperlipidemia, unspecified: Secondary | ICD-10-CM | POA: Diagnosis not present

## 2019-11-26 DIAGNOSIS — I10 Essential (primary) hypertension: Secondary | ICD-10-CM | POA: Diagnosis not present

## 2019-11-26 DIAGNOSIS — M5136 Other intervertebral disc degeneration, lumbar region: Secondary | ICD-10-CM | POA: Diagnosis not present

## 2019-12-01 ENCOUNTER — Other Ambulatory Visit: Payer: Self-pay | Admitting: Family Medicine

## 2019-12-01 DIAGNOSIS — I10 Essential (primary) hypertension: Secondary | ICD-10-CM

## 2019-12-03 ENCOUNTER — Ambulatory Visit (HOSPITAL_COMMUNITY): Payer: Medicare HMO | Admitting: Hematology

## 2019-12-07 ENCOUNTER — Ambulatory Visit (HOSPITAL_COMMUNITY): Payer: Medicare HMO | Admitting: Nurse Practitioner

## 2019-12-14 IMAGING — DX DG HIP (WITH OR WITHOUT PELVIS) 2-3V*R*
3 series · 3 of 3 positions shown · non-contrast
Comparison: 05/14/2016

CLINICAL DATA: Right hip pain

EXAM:
DG HIP (WITH OR WITHOUT PELVIS) 2-3V RIGHT

[pelvis ap]
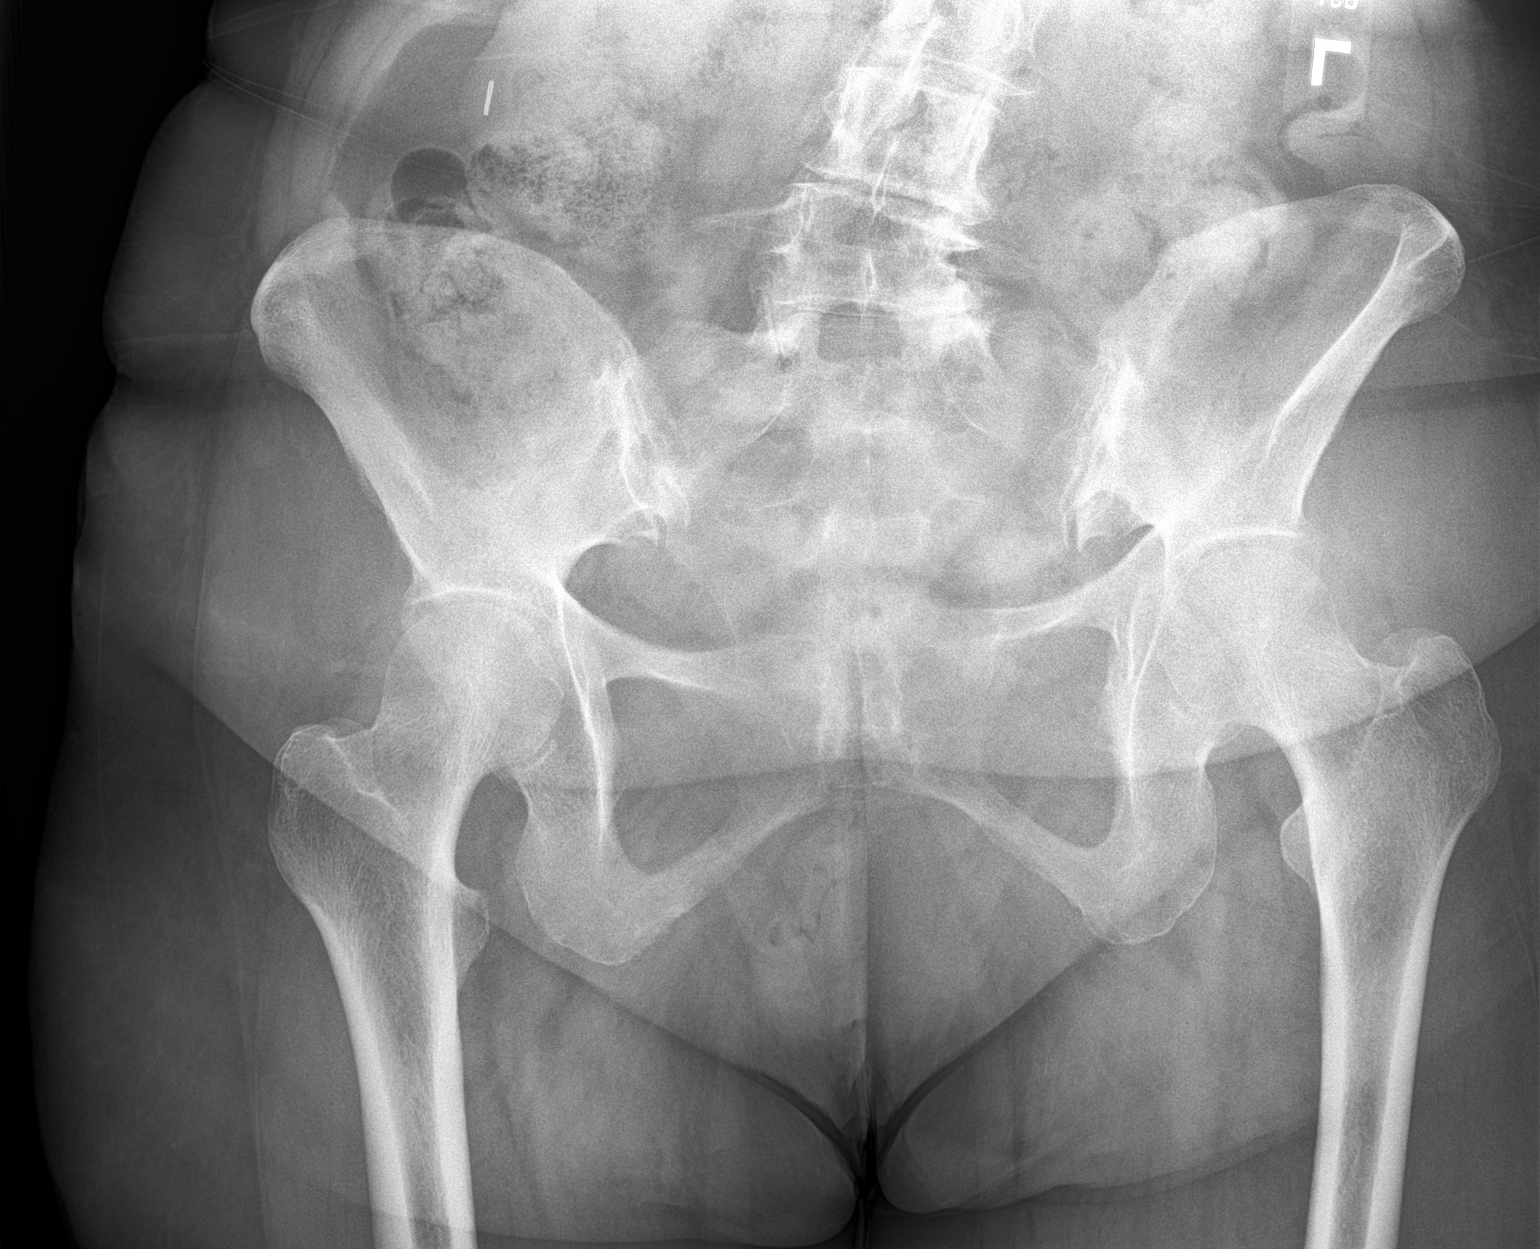

[hip ap]
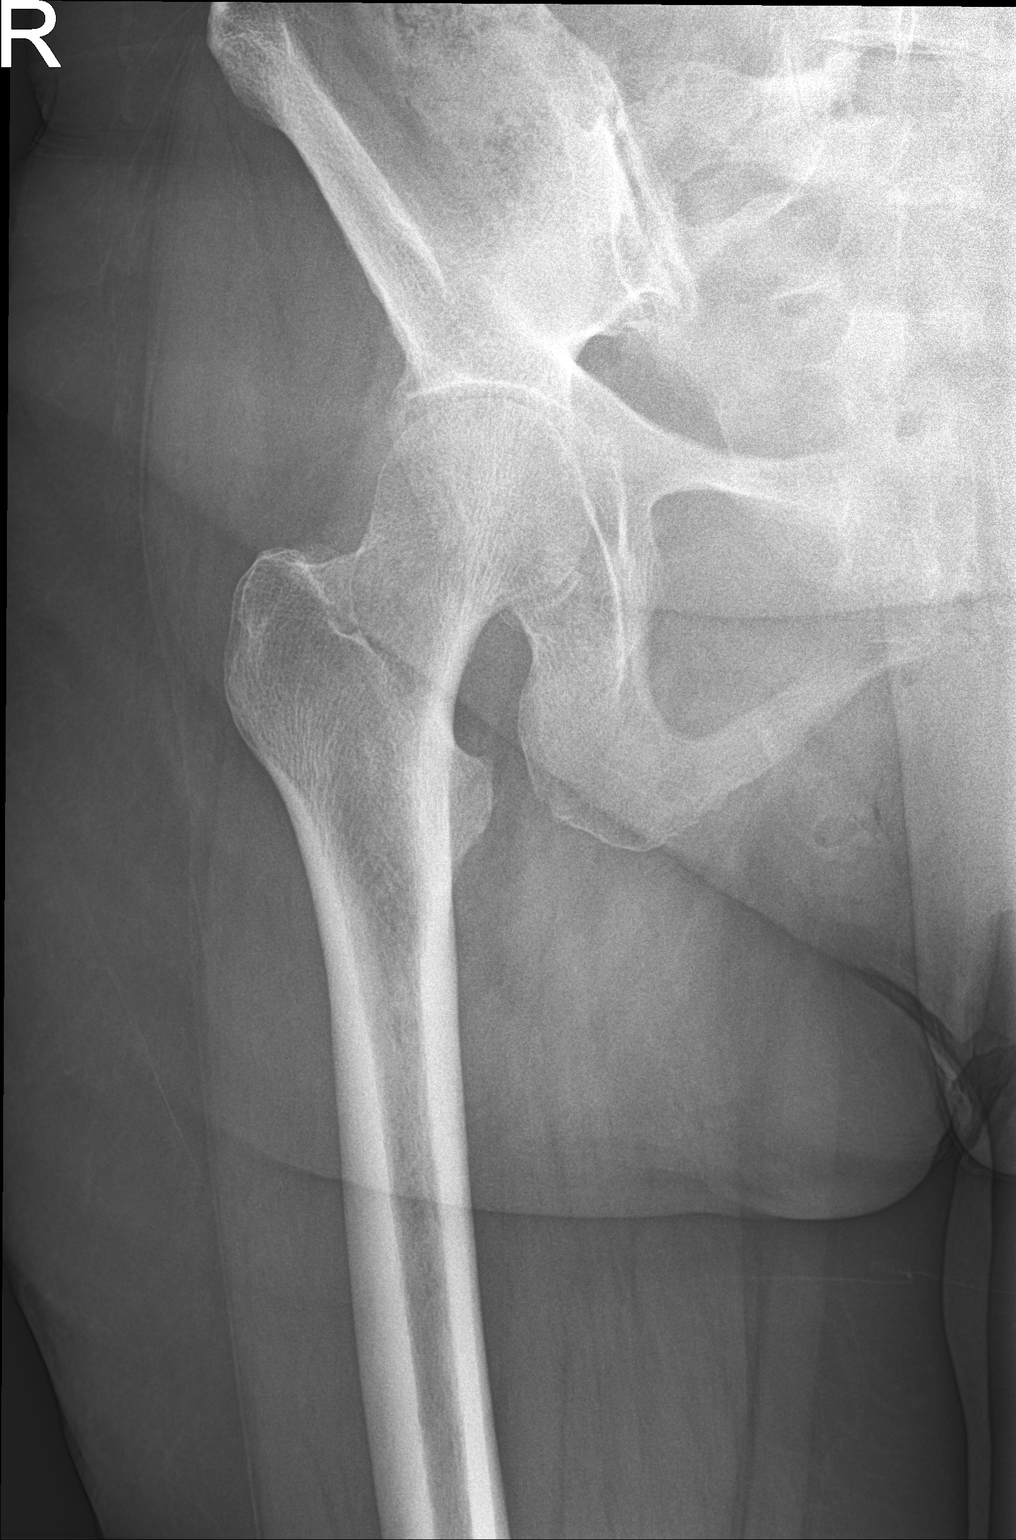

[hip lat]
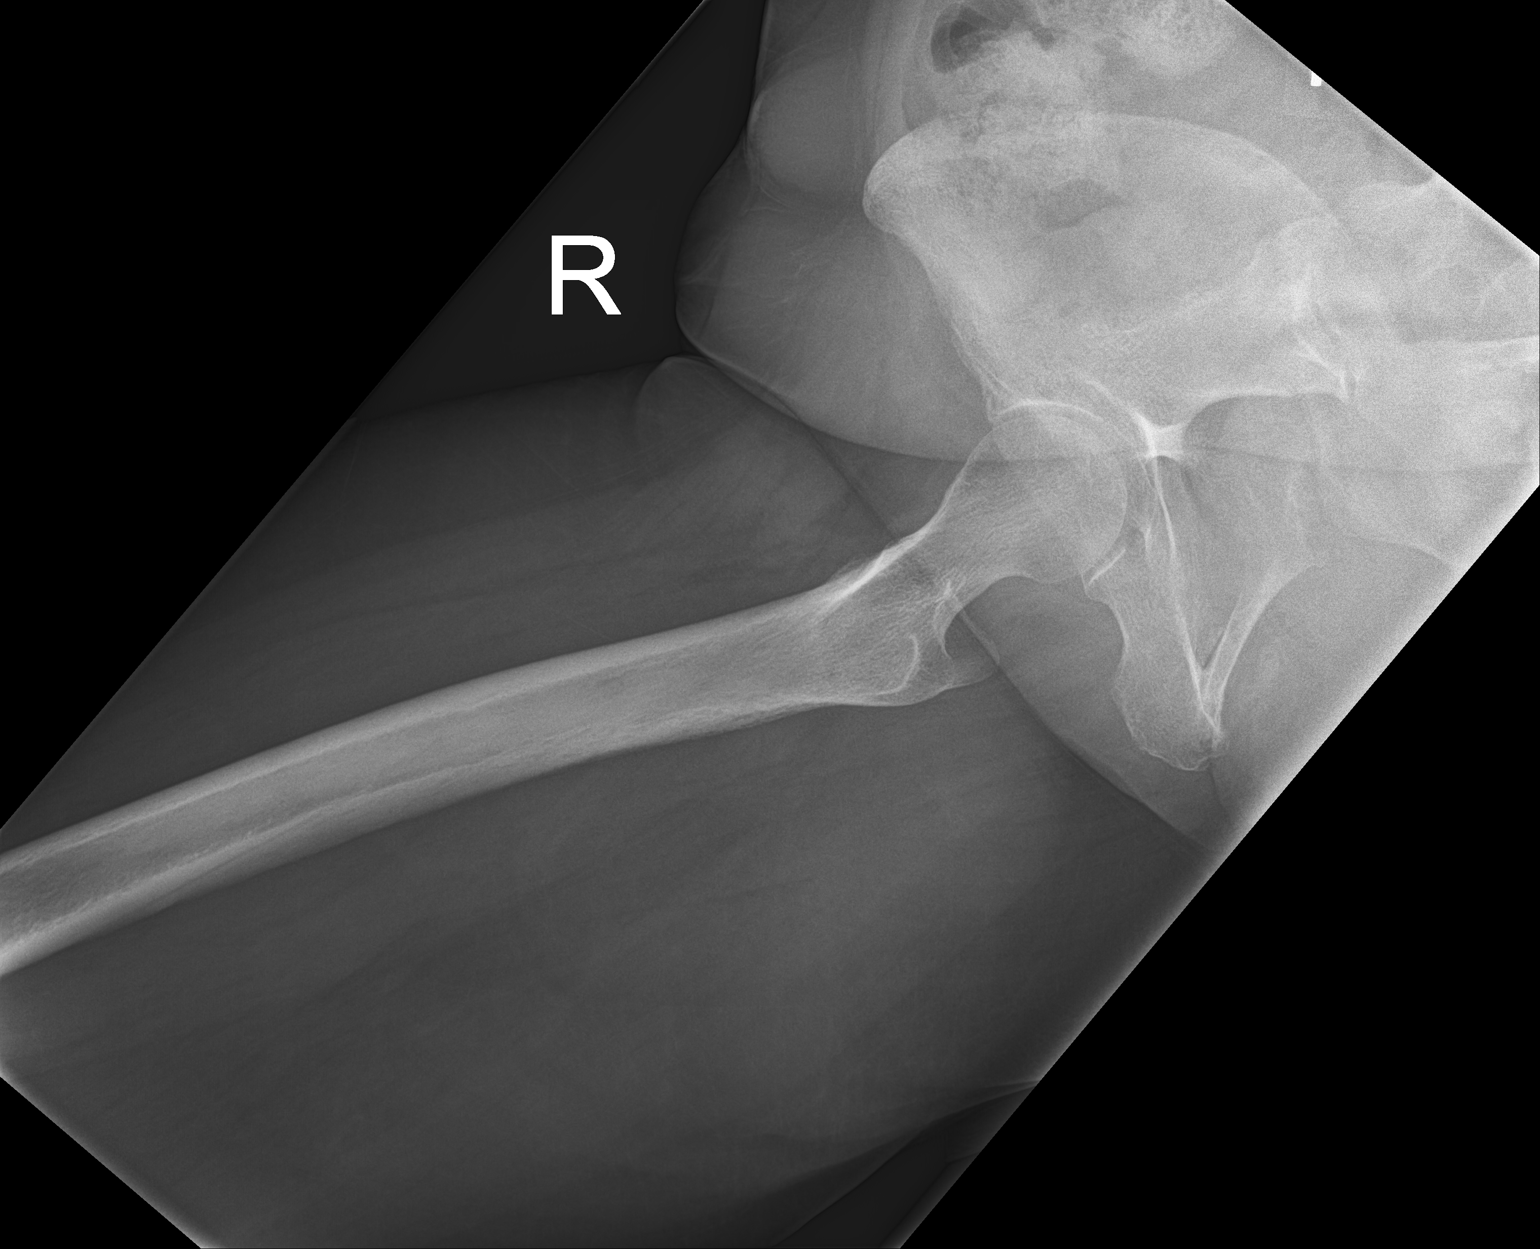

[3 of 3 positions shown; findings below may reference images not displayed]

FINDINGS: Negative for hip fracture or lesion. No hip joint narrowing or
spurring. There is scoliosis and degenerative disc narrowing in the
lumbar spine. Mild offset at the symphysis pubis with congruous
bilateral sacroiliac joints, stable from prior.
IMPRESSION: 1. Negative right hip series.
2. Lumbar disc degeneration and scoliosis.

## 2019-12-18 ENCOUNTER — Other Ambulatory Visit: Payer: Self-pay | Admitting: Family Medicine

## 2019-12-18 DIAGNOSIS — K219 Gastro-esophageal reflux disease without esophagitis: Secondary | ICD-10-CM

## 2020-01-08 DIAGNOSIS — E892 Postprocedural hypoparathyroidism: Secondary | ICD-10-CM | POA: Diagnosis not present

## 2020-01-08 DIAGNOSIS — C73 Malignant neoplasm of thyroid gland: Secondary | ICD-10-CM | POA: Diagnosis not present

## 2020-01-08 DIAGNOSIS — Z9009 Acquired absence of other part of head and neck: Secondary | ICD-10-CM | POA: Diagnosis not present

## 2020-01-16 ENCOUNTER — Other Ambulatory Visit: Payer: Self-pay | Admitting: Family Medicine

## 2020-01-16 DIAGNOSIS — G8929 Other chronic pain: Secondary | ICD-10-CM

## 2020-01-16 DIAGNOSIS — M545 Low back pain, unspecified: Secondary | ICD-10-CM

## 2020-01-16 DIAGNOSIS — F411 Generalized anxiety disorder: Secondary | ICD-10-CM

## 2020-01-18 ENCOUNTER — Encounter: Payer: Self-pay | Admitting: Family Medicine

## 2020-01-18 ENCOUNTER — Other Ambulatory Visit: Payer: Self-pay

## 2020-01-18 ENCOUNTER — Ambulatory Visit (INDEPENDENT_AMBULATORY_CARE_PROVIDER_SITE_OTHER): Payer: Medicare HMO | Admitting: Family Medicine

## 2020-01-18 VITALS — HR 106 | Ht 61.0 in | Wt 140.0 lb

## 2020-01-18 DIAGNOSIS — K219 Gastro-esophageal reflux disease without esophagitis: Secondary | ICD-10-CM

## 2020-01-18 DIAGNOSIS — M5136 Other intervertebral disc degeneration, lumbar region: Secondary | ICD-10-CM | POA: Diagnosis not present

## 2020-01-18 DIAGNOSIS — E782 Mixed hyperlipidemia: Secondary | ICD-10-CM | POA: Diagnosis not present

## 2020-01-18 DIAGNOSIS — M545 Low back pain, unspecified: Secondary | ICD-10-CM | POA: Diagnosis not present

## 2020-01-18 DIAGNOSIS — I10 Essential (primary) hypertension: Secondary | ICD-10-CM | POA: Diagnosis not present

## 2020-01-18 DIAGNOSIS — G8929 Other chronic pain: Secondary | ICD-10-CM

## 2020-01-18 DIAGNOSIS — F411 Generalized anxiety disorder: Secondary | ICD-10-CM

## 2020-01-18 MED ORDER — PANTOPRAZOLE SODIUM 40 MG PO TBEC: 40.0000 mg | DELAYED_RELEASE_TABLET | Freq: Every day | ORAL | 3 refills | Status: DC

## 2020-01-18 MED ORDER — MELOXICAM 15 MG PO TABS: 15.0000 mg | ORAL_TABLET | Freq: Every day | ORAL | 3 refills | Status: DC

## 2020-01-18 MED ORDER — AMLODIPINE BESYLATE 5 MG PO TABS: 5.0000 mg | ORAL_TABLET | Freq: Every day | ORAL | 3 refills | Status: DC

## 2020-01-18 MED ORDER — TRAMADOL HCL 50 MG PO TABS: 50.0000 mg | ORAL_TABLET | Freq: Three times a day (TID) | ORAL | 2 refills | Status: DC | PRN

## 2020-01-18 MED ORDER — DULOXETINE HCL 60 MG PO CPEP: 60.0000 mg | ORAL_CAPSULE | Freq: Every day | ORAL | 3 refills | Status: DC

## 2020-01-18 NOTE — Progress Notes (Signed)
Pulse (!) 106   Ht 5' 1"  (1.549 m)   Wt 140 lb (63.5 kg)   SpO2 97%   BMI 26.45 kg/m    Subjective:   Patient ID: Maria Moss, female    DOB: 1950/07/23, 70 y.o.   MRN: [id number]  HPI: Maria Moss is a 70 y.o. female presenting on 01/18/2020 for Medical Management of Chronic Issues, Hyperlipidemia, and Hypertension   HPI Pt coming in today for a 3 month follow up on hypertension, back pain, and cholesterol.   Hypertension She states that she takes her BP a home on occasion and they typically run in the 417'E systolic but some have been in the 150's. Her BP today is goo at 139/75. She acknowledges taking her medications on a daily basis and has not missed doses.  Hyperlipidemia She states that she has been taking fish oil pills for a while.  Pain Pt states that her back pain has been well controlled and has not gotten any worse recently. She still endorses lower back pain that radiates down her right leg but states that the Tramadol does help alleviate pain. She also notes tenderness to palpation of the right lower leg and some swelling in both. She has been wearing compression stockings which she states has been helping. She also reports some nightly heart burn that is alleviated by antacids. The is swelling bilaterally in both legs but no pitting edema and she denies numbness or tingling, skin is p/w/d/.   Pt states that she had surgery in November for hiatal hernia repair. Since then she states she has been doing good, with many of her previous symptoms fading away. She has been taking the Cymbalta which she states has been working well.  Relevant past medical, surgical, family and social history reviewed and updated as indicated. Interim medical history since our last visit reviewed. Allergies and medications reviewed and updated.  Review of Systems  Constitutional: Negative.   HENT: Negative.   Eyes: Negative.   Respiratory: Negative.   Cardiovascular: Positive for leg  swelling. Negative for chest pain and palpitations.  Gastrointestinal: Positive for constipation (has been dealing with it for years but no new changes). Negative for abdominal pain, nausea and vomiting.  Endocrine: Negative.   Genitourinary: Negative.   Musculoskeletal: Positive for back pain. Negative for arthralgias, joint swelling, myalgias and neck pain.  Skin: Negative.   Neurological: Positive for weakness (some weakness in the right leg from not using it). Negative for dizziness, tremors, syncope, light-headedness, numbness and headaches.  Psychiatric/Behavioral: Negative.     Per HPI unless specifically indicated above   Allergies as of 01/18/2020      Reactions   Codeine Nausea And Vomiting   Sulfa Antibiotics    Hair loss, yellowed skin   Doxycycline Hyclate Itching      Medication List       Accurate as of January 18, 2020 11:05 AM. If you have any questions, ask your nurse or doctor.        STOP taking these medications   cephALEXin 500 MG capsule Commonly known as: KEFLEX Stopped by: Fransisca Kaufmann , MD     TAKE these medications   acetaminophen 500 MG tablet Commonly known as: TYLENOL Take by mouth.   alum & mag hydroxide-simeth 081-448-18 MG/5ML suspension Commonly known as: MAALOX PLUS Take by mouth.   amLODipine 5 MG tablet Commonly known as: NORVASC Take 1 tablet (5 mg total) by mouth daily.   cetirizine 10  MG tablet Commonly known as: ZYRTEC Take 10 mg by mouth daily.   Cholecalciferol 25 MCG (1000 UT) tablet Take 3,000 Units by mouth daily.   diclofenac Sodium 1 % Gel Commonly known as: VOLTAREN Apply topically.   dicyclomine 10 MG capsule Commonly known as: BENTYL Take 10 mg by mouth in the morning and at bedtime.   DULoxetine 60 MG capsule Commonly known as: CYMBALTA Take 1 capsule (60 mg total) by mouth daily.   famotidine 40 MG tablet Commonly known as: PEPCID 40 mg 2 (two) times daily. What changed: Another medication  with the same name was removed. Continue taking this medication, and follow the directions you see here. Changed by: Fransisca Kaufmann , MD   famotidine 40 MG tablet Commonly known as: PEPCID What changed: Another medication with the same name was removed. Continue taking this medication, and follow the directions you see here. Changed by: Fransisca Kaufmann , MD   Fish Oil 1000 MG Caps Take 1,000 mg by mouth daily.   fluticasone 50 MCG/ACT nasal spray Commonly known as: FLONASE USE 2 SPRAYS IN EACH NOSTRIL EVERY DAY   fluticasone 50 MCG/ACT nasal spray Commonly known as: FLONASE Place 2 sprays into both nostrils daily.   furosemide 40 MG tablet Commonly known as: LASIX   furosemide 20 MG tablet Commonly known as: LASIX Take 1 tablet (20 mg total) by mouth daily.   HEMP OIL-VANILLYL BUTYL ETHER EX Apply topically.   hyoscyamine 0.125 MG Tbdp disintergrating tablet Commonly known as: ANASPAZ Take 1 tablet (0.125 mg total) by mouth every 6 (six) hours as needed.   IMODIUM PO Take by mouth as needed.   loratadine 10 MG tablet Commonly known as: CLARITIN Take 10 mg by mouth daily.   meloxicam 15 MG tablet Commonly known as: MOBIC Take 1 tablet (15 mg total) by mouth daily.   nystatin-triamcinolone ointment Commonly known as: MYCOLOG   ondansetron 4 MG tablet Commonly known as: Zofran Take 1 tablet (4 mg total) by mouth every 8 (eight) hours as needed for nausea or vomiting.   OVER THE COUNTER MEDICATION Take 250 mg by mouth daily. Hemp oil/ 1 drop under tongue   OVER THE COUNTER MEDICATION Apply 1 application topically in the morning and at bedtime. Roll on/ Hemp oil 1000 mg   pantoprazole 40 MG tablet Commonly known as: PROTONIX Take 1 tablet (40 mg total) by mouth daily.   PROBIOTIC & ACIDOPHILUS EX ST PO Take 1 capsule by mouth at bedtime.   traMADol 50 MG tablet Commonly known as: ULTRAM Take 1 tablet (50 mg total) by mouth every 8 (eight) hours as  needed (mild pain).   triamcinolone 0.1 % Commonly known as: KENALOG        Objective:   Pulse (!) 106   Ht 5' 1"  (1.549 m)   Wt 140 lb (63.5 kg)   SpO2 97%   BMI 26.45 kg/m   Wt Readings from Last 3 Encounters:  01/18/20 140 lb (63.5 kg)  10/18/19 150 lb 4 oz (68.2 kg)  08/30/19 151 lb 9.6 oz (68.8 kg)    Physical Exam Constitutional:      Appearance: Normal appearance.  HENT:     Head: Normocephalic.     Right Ear: Tympanic membrane normal.     Left Ear: Tympanic membrane normal.     Nose: Nose normal.     Mouth/Throat:     Mouth: Mucous membranes are moist.  Eyes:     Extraocular Movements: Extraocular  movements intact.     Conjunctiva/sclera: Conjunctivae normal.     Pupils: Pupils are equal, round, and reactive to light.  Cardiovascular:     Rate and Rhythm: Normal rate and regular rhythm.     Pulses: Normal pulses.     Heart sounds: Normal heart sounds.  Pulmonary:     Effort: Pulmonary effort is normal.     Breath sounds: Normal breath sounds.  Abdominal:     General: Abdomen is flat.     Palpations: Abdomen is soft.  Musculoskeletal:        General: Swelling (lower legs bilat) and tenderness (right leg) present. Normal range of motion.     Cervical back: Normal range of motion and neck supple.     Right lower leg: Swelling present. No edema.     Left lower leg: Swelling present. No edema.  Skin:    General: Skin is warm and dry.     Findings: No bruising or rash.  Neurological:     General: No focal deficit present.     Mental Status: She is alert and oriented to person, place, and time.  Psychiatric:        Mood and Affect: Mood normal.       Assessment & Plan:   Problem List Items Addressed This Visit      Cardiovascular and Mediastinum   Hypertension - Primary (Chronic)   Relevant Medications   amLODipine (NORVASC) 5 MG tablet     Musculoskeletal and Integument   Degenerative disc disease, lumbar (Chronic)   Relevant Medications    meloxicam (MOBIC) 15 MG tablet   traMADol (ULTRAM) 50 MG tablet   Other Relevant Orders   ToxASSURE Select 13 (MW), Urine (Completed)     Other   Hyperlipemia (Chronic)   Relevant Medications   amLODipine (NORVASC) 5 MG tablet   Other Relevant Orders   Lipid panel (Completed)   GAD (generalized anxiety disorder) (Chronic)   Relevant Medications   DULoxetine (CYMBALTA) 60 MG capsule   Other Relevant Orders   CBC with Differential/Platelet (Completed)   CMP14+EGFR (Completed)    Other Visit Diagnoses    Essential hypertension  (Chronic)      Relevant Medications   amLODipine (NORVASC) 5 MG tablet   Other Relevant Orders   CMP14+EGFR (Completed)   Chronic midline low back pain without sciatica       Relevant Medications   DULoxetine (CYMBALTA) 60 MG capsule   meloxicam (MOBIC) 15 MG tablet   traMADol (ULTRAM) 50 MG tablet   Gastroesophageal reflux disease without esophagitis       Relevant Medications   dicyclomine (BENTYL) 10 MG capsule   pantoprazole (PROTONIX) 40 MG tablet   Other Relevant Orders   CBC with Differential/Platelet (Completed)   CMP14+EGFR (Completed)       Follow up plan: Return in about 3 months (around 04/17/2020), or if symptoms worsen or fail to improve, for Pain management and hypertension. Pt medications will be refilled and labs drawn. Pt encouraged to continue using compression stockings to reduce swelling and alleviate pain as well as continue current medications. Pt will schedule for a follow up in 3 months.  Counseling provided for all of the vaccine components Orders Placed This Encounter  Procedures  . CBC with Differential/Platelet  . CMP14+EGFR  . Lipid panel   Silverio Decamp 01/18/2020  I was personally present for all components of the history, physical exam and/or medical decision making.  I agree with the documentation performed  by the PA student and agree with assessment and plan above.  PA student was [employer] Caryl Pina,  MD [facility] 01/27/2020, 10:00 PM    Caryl Pina, MD [facility] 01/18/2020, 11:05 AM

## 2020-01-19 LAB — CMP14+EGFR
ALT: 24 IU/L (ref 0–32)
AST: 23 IU/L (ref 0–40)
Albumin/Globulin Ratio: 1.7 (ref 1.2–2.2)
Albumin: 4.5 g/dL (ref 3.8–4.8)
Alkaline Phosphatase: 180 IU/L — ABNORMAL HIGH (ref 44–121)
BUN/Creatinine Ratio: 15 (ref 12–28)
BUN: 18 mg/dL (ref 8–27)
Bilirubin Total: 0.6 mg/dL (ref 0.0–1.2)
CO2: 26 mmol/L (ref 20–29)
Calcium: 9.5 mg/dL (ref 8.7–10.3)
Chloride: 99 mmol/L (ref 96–106)
Creatinine, Ser: 1.17 mg/dL — ABNORMAL HIGH (ref 0.57–1.00)
GFR calc Af Amer: 56 mL/min/{1.73_m2} — ABNORMAL LOW (ref >59)
GFR calc non Af Amer: 48 mL/min/{1.73_m2} — ABNORMAL LOW (ref >59)
Globulin, Total: 2.7 g/dL (ref 1.5–4.5)
Glucose: 89 mg/dL (ref 65–99)
Potassium: 4.7 mmol/L (ref 3.5–5.2)
Sodium: 141 mmol/L (ref 134–144)
Total Protein: 7.2 g/dL (ref 6.0–8.5)

## 2020-01-19 LAB — LIPID PANEL
Chol/HDL Ratio: 4 ratio (ref 0.0–4.4)
Cholesterol, Total: 259 mg/dL — ABNORMAL HIGH (ref 100–199)
HDL: 65 mg/dL (ref >39)
LDL Chol Calc (NIH): 159 mg/dL — ABNORMAL HIGH (ref 0–99)
Triglycerides: 194 mg/dL — ABNORMAL HIGH (ref 0–149)
VLDL Cholesterol Cal: 35 mg/dL (ref 5–40)

## 2020-01-19 LAB — CBC WITH DIFFERENTIAL/PLATELET
Basophils Absolute: 0 10*3/uL (ref 0.0–0.2)
Basos: 0 %
EOS (ABSOLUTE): 0.5 10*3/uL — ABNORMAL HIGH (ref 0.0–0.4)
Eos: 5 %
Hematocrit: 39.5 % (ref 34.0–46.6)
Hemoglobin: 13.3 g/dL (ref 11.1–15.9)
Immature Grans (Abs): 0 10*3/uL (ref 0.0–0.1)
Immature Granulocytes: 0 %
Lymphocytes Absolute: 2 10*3/uL (ref 0.7–3.1)
Lymphs: 21 %
MCH: 29.8 pg (ref 26.6–33.0)
MCHC: 33.7 g/dL (ref 31.5–35.7)
MCV: 89 fL (ref 79–97)
Monocytes Absolute: 1 10*3/uL — ABNORMAL HIGH (ref 0.1–0.9)
Monocytes: 11 %
Neutrophils Absolute: 5.8 10*3/uL (ref 1.4–7.0)
Neutrophils: 63 %
Platelets: 548 10*3/uL — ABNORMAL HIGH (ref 150–450)
RBC: 4.46 x10E6/uL (ref 3.77–5.28)
RDW: 13.2 % (ref 11.7–15.4)
WBC: 9.4 10*3/uL (ref 3.4–10.8)

## 2020-01-23 ENCOUNTER — Other Ambulatory Visit: Payer: Self-pay

## 2020-01-24 ENCOUNTER — Other Ambulatory Visit: Payer: Self-pay | Admitting: Family Medicine

## 2020-01-25 ENCOUNTER — Ambulatory Visit: Payer: Medicare HMO | Admitting: Internal Medicine

## 2020-01-25 ENCOUNTER — Other Ambulatory Visit: Payer: Self-pay

## 2020-01-25 ENCOUNTER — Encounter: Payer: Self-pay | Admitting: Internal Medicine

## 2020-01-25 VITALS — BP 120/78 | HR 70 | Ht 61.0 in | Wt 139.8 lb

## 2020-01-25 DIAGNOSIS — E213 Hyperparathyroidism, unspecified: Secondary | ICD-10-CM

## 2020-01-25 DIAGNOSIS — E559 Vitamin D deficiency, unspecified: Secondary | ICD-10-CM | POA: Diagnosis not present

## 2020-01-25 DIAGNOSIS — E041 Nontoxic single thyroid nodule: Secondary | ICD-10-CM

## 2020-01-25 DIAGNOSIS — E890 Postprocedural hypothyroidism: Secondary | ICD-10-CM

## 2020-01-25 DIAGNOSIS — E278 Other specified disorders of adrenal gland: Secondary | ICD-10-CM

## 2020-01-25 LAB — TSH: TSH: 2.64 u[IU]/mL (ref 0.35–4.50)

## 2020-01-25 LAB — TOXASSURE SELECT 13 (MW), URINE

## 2020-01-25 LAB — T3, FREE: T3, Free: 3.4 pg/mL (ref 2.3–4.2)

## 2020-01-25 LAB — T4, FREE: Free T4: 0.68 ng/dL (ref 0.60–1.60)

## 2020-01-25 NOTE — Patient Instructions (Addendum)
Please stop at the lab.  If we need to start thyroid hormone (Levothyroxine), Take it every day, with water, at least 30 minutes before breakfast, separated by at least 4 hours from: - acid reflux medications - calcium - iron - multivitamins  Please come back for a follow-up appointment in 6 months.  

## 2020-01-25 NOTE — Progress Notes (Signed)
Patient ID: [name], female   DOB: 07-05-1950, 70 y.o.   MRN: [id number]   This visit occurred during the SARS-CoV-2 public health emergency.  Safety protocols were in place, including screening questions prior to the visit, additional usage of staff PPE, and extensive cleaning of exam room while observing appropriate contact time as indicated for disinfecting solutions.   HPI  Maria Moss is a 70 y.o.-year-old female, initially referred by Dr. Rosendo Gros [facility]), presenting for follow-up for a R adrenal incidentaloma.  She also has a history of primary hyperparathyroidism and a right thyroid nodule which turned out to be a follicular variant of papillary thyroid cancer.  Last visit 6 months ago.  She had hiatal hernia sx. 11/26/2019 >> lost weight before and then after the sx 2/2 changes in det.  Right adrenal incidentaloma:  Reviewed previous imaging studies and investigations: Lumbar MRI (10/28/2017): 5.2 x 4.4 R renal or adrenal heterogeneous soft tissue mass  CT abdomen w/ and w/o contrast (11/04/2017): R adrenal 5 cm complex necrotic appearing and enhancing mass with nodular areas of contrast enhancement and scattered areas of calcification >> possible RCC vs. metastasis  PET CT (11/18/2017):  CHEST: No hypermetabolic mediastinal or hilar nodes. No suspicious pulmonary nodules on the CT scan. ABDOMEN/PELVIS: Large RIGHT renal mass again noted measuring 5.0 by 4.5 cm. Mass has central photopenia and mild peripheral metabolic activity with SUV max equal 3.1 (along the medial border of the mass for example.) This mild activity is similar to liver activity (SUV max equal 3.8). The LEFT adrenal glands normal. No other sites of abnormal FDG uptake.  IMPRESSION: 1. Large RIGHT adrenal mass with central photopenia and mild peripheral metabolic activity. Findings are nonspecific. Recommend surgical consultation for lesions of this size. 2. No evidence of primary or metastatic  carcinoma outside of the adrenal gland.  Of note, she had normal testosterone, estradiol, 24-hour urine cortisol and 24-hour urine metanephrines and catecholamines before the surgery.  After the surgery, a cosyntropin stimulation test was normal.  Please review my last office visit note for more details of the hormonal investigation.  She had right adrenalectomy by Dr. 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  She feels well after surgery, without complaints.    A cosyntropin stimulation test was normal after surgery and cortisol level remained normal afterwards: Component     Latest Ref Rng & Units 09/01/2018 09/01/2018 09/01/2018 02/02/2019         9:45 AM 10:30 AM 10:58 AM   Cortisol, Plasma     ug/dL 12.3 19.7 22.4 13.3    Vitamin D deficiency:  Reviewed vitamin D levels: Lab Results  Component Value Date   VD25OH 50.2 07/20/2019   VD25OH 73.31 02/02/2019   VD25OH 62.95 07/28/2018   VD25OH 29.1 (L) 04/14/2017   VD25OH 43.9 11/02/2013  She was on 3000 units vitamin D daily, now 4000 IU daily, dose increased upon her by herself) at the end of 12/2019.  Primary hyperparathyroidism:  Reviewed pertinent labs: Lab Results  Component Value Date   PTH 52 07/20/2019   PTH Comment 07/20/2019   PTH 46 03/05/2019   PTH 54 02/02/2019   PTH Comment 02/02/2019   PTH 41 11/10/2017   PTH Comment 11/10/2017   Lab Results  Component Value Date   CALCIUM 9.5 01/18/2020   CALCIUM 9.3 08/23/2019   CALCIUM 9.7 08/10/2019   CALCIUM 9.6 07/20/2019   CALCIUM 8.7 (L) 05/30/2019   CALCIUM 10.0 05/25/2019  CALCIUM 10.6 (H) 04/16/2019   CALCIUM 11.0 (H) 03/05/2019   CALCIUM 10.4 (H) 02/13/2019   CALCIUM 10.5 (H) 02/02/2019   CALCIUM 10.0 10/13/2018   CALCIUM 10.1 08/24/2018   CALCIUM 10.3 08/17/2018   CALCIUM 8.6 (L) 06/01/2018   CALCIUM 9.9 05/22/2018   CALCIUM 10.3 01/27/2018   CALCIUM 10.5 (H) 11/10/2017   CALCIUM 10.4 (H) 10/21/2017   CALCIUM 10.5 (H)  06/02/2017   CALCIUM 10.4 (H) 04/14/2017   CALCIUM 10.4 (H) 12/16/2016   CALCIUM 10.5 (H) 09/24/2016   CALCIUM 10.6 (H) 09/10/2016   CALCIUM 10.2 06/11/2016   CALCIUM 10.3 03/05/2016   CALCIUM 9.8 11/28/2015   CALCIUM 10.3 08/29/2015   CALCIUM 10.1 03/21/2015   CALCIUM 10.4 (H) 11/29/2014   CALCIUM 9.9 05/17/2014   CALCIUM 10.3 02/15/2014   CALCIUM 10.3 07/19/2013   CALCIUM 10.0 03/19/2013   CALCIUM 10.6 (H) 11/22/2012   CALCIUM 10.7 (H) 07/21/2012   She was initially on Tums, which was stopped and calcium normalized, but it started to increase again in 01/2019  Technetium sestamibi scan (05/03/2019): RIGHT inferior parathyroid adenoma.  Cannot exclude coexistent parathyroid adenoma at the RIGHT superior parathyroid gland.  Potential cold nodule within the RIGHT thyroid lobe near upper pole; consider follow-up thyroid ultrasound assessment to further evaluate.  Thyroid U/S (05/11/2019): Parenchymal Echotexture: Moderately heterogenous Isthmus: 4 mm Right lobe: 6.1 x 2.2 x 3.1 cm Left lobe: 5.1 x 1.2 x 1.6 cm _______________________________________________________  Estimated total number of nodules >/= 1 cm: 1 _____________________________________________  Nodule # 1: Location: Right; Mid Maximum size: 2.2 cm; Other 2 dimensions: 2.0 x 1.8 cm Composition: spongiform (0) Echogenicity: isoechoic (1) This nodule does NOT meet TI-RADS criteria for biopsy or dedicated follow-up. _________________________________________________________  Moderate thyroid heterogeneity with mixed echogenicity and background pseudo nodularity. No hypervascularity. No regional adenopathy.  IMPRESSION: 2.2 cm right mid thyroid TR 1 spongiform nodule correlates with the cold nodule by the parathyroid scan. This would not meet criteria for any biopsy or follow-up.  Nonspecific thyroid heterogeneity.  She had right inferior parathyroidectomy + right thyroid lobectomy  (05/29/2019)-Dr. Gerkin.  Pathology showed a parathyroid adenoma and calcium decreased from 11 preop to 9.7 postop  (06/11/2019).  PTH postop was 51.  However, surprisingly, pathology of the right thyroid nodule showed  follicular variant of papillary thyroid cancer, measuring 1.7 cm, with negative surgical margins.  Pt denies: - feeling nodules in neck - hoarseness - choking - SOB with lying down  She has: - dysphagia after the hiatal hernia sx.  Osteopenia per review of the DXA scan reports from 2018 and 2019:  DXA scan ([facility]) from 04/14/2017: L1-L4: -1.0 RFN: -1.7 LFN: -1.6 33% distal radius: -0.4 FRAX: 15.8%, 2%  DXA scan ([facility]) from 10/24/2016: L1-L4: -0.5 RFN: -1.0 LFN: -1.2  No history of kidney stones.  ROS: Constitutional: no weight gain/+ weight loss, no fatigue, no subjective hyperthermia, no subjective hypothermia Eyes: no blurry vision, no xerophthalmia ENT: no sore throat, + see HPI Cardiovascular: no CP/no SOB/no palpitations/+ leg swelling Respiratory: no cough/no SOB/no wheezing Gastrointestinal: no N/no V/no D/no C/no acid reflux Musculoskeletal: no muscle aches/+ joint aches Skin: no rashes, no hair loss Neurological: no tremors/no numbness/no tingling/no dizziness  I reviewed pt's medications, allergies, PMH, social hx, family hx, and changes were documented in the history of present illness. Otherwise, unchanged from my initial visit note.  Past Medical History:  Diagnosis Date   Anemia    iron deficiency   Ankle fracture, left  2006   Diverticula, intestine    Mild case.   Fibromyalgia    Dr. Justine Null Dx in [place] years ago  no meds    GERD (gastroesophageal reflux disease)    History of hiatal hernia    Hyperlipidemia    Hyperparathyroidism ([place])    Hypertension    Dx age 43.    Stroke [facility])    Questionable stoke when born or polio not sure pt. has right side weakness toes right foot turns outward and  toes curled up some     Past Surgical History:  Procedure Laterality Date   ANKLE SURGERY Left    Broken.  Has screws and metal plate.   CHOLECYSTECTOMY     PARATHYROIDECTOMY Right 05/29/2019   Procedure: RIGHT INFERIOR PARATHYROIDECTOMY;  Surgeon: Armandina Gemma, MD;  Location: [facility];  Service: General;  Laterality: Right;   ROBOTIC ADRENALECTOMY Right 05/31/2018   Procedure: XI ROBOTIC RIGHT ADRENALECTOMY;  Surgeon: Ralene Ok, MD;  Location: [facility];  Service: General;  Laterality: Right;   THYROID LOBECTOMY Right 05/29/2019   Procedure: RIGHT THYROID LOBECTOMY;  Surgeon: Armandina Gemma, MD;  Location: [facility];  Service: General;  Laterality: Right;    Social History   Socioeconomic History   Marital status: Married    Spouse name: Not on file   Number of children: Not on file   Years of education: Not on file   Highest education level: Not on file  Occupational History   Not on file  Tobacco Use   Smoking status: Never Smoker   Smokeless tobacco: Never Used  Vaping Use   Vaping Use: Never used  Substance and Sexual Activity   Alcohol use: No   Drug use: No   Sexual activity: Not Currently  Other Topics Concern   Not on file  Social History Narrative   Not on file   Social Determinants of Health   Financial Resource Strain: Not on file  Food Insecurity: Not on file  Transportation Needs: Not on file  Physical Activity: Not on file  Stress: Not on file  Social Connections: Not on file  Intimate Partner Violence: Not on file    Current Outpatient Medications on File Prior to Visit  Medication Sig Dispense Refill   acetaminophen (TYLENOL) 500 MG tablet Take by mouth.     alum & mag hydroxide-simeth (MAALOX PLUS) 400-400-40 MG/5ML suspension Take by mouth.     amLODipine (NORVASC) 5 MG tablet Take 1 tablet (5 mg total) by mouth daily. 90 tablet 3   cetirizine (ZYRTEC) 10 MG tablet Take 10 mg by mouth daily.     Cholecalciferol 25 MCG (1000  UT) tablet Take 3,000 Units by mouth daily.      diclofenac Sodium (VOLTAREN) 1 % GEL Apply topically.     dicyclomine (BENTYL) 10 MG capsule Take 10 mg by mouth in the morning and at bedtime.     DULoxetine (CYMBALTA) 60 MG capsule Take 1 capsule (60 mg total) by mouth daily. 90 capsule 3   famotidine (PEPCID) 40 MG tablet      famotidine (PEPCID) 40 MG tablet 40 mg 2 (two) times daily.     fluticasone (FLONASE) 50 MCG/ACT nasal spray USE 2 SPRAYS IN EACH NOSTRIL EVERY DAY 48 g 1   furosemide (LASIX) 20 MG tablet Take 1 tablet (20 mg total) by mouth daily. 90 tablet 3   furosemide (LASIX) 40 MG tablet      HEMP OIL-VANILLYL BUTYL ETHER EX Apply topically.  hyoscyamine (ANASPAZ) 0.125 MG TBDP disintergrating tablet Take 1 tablet (0.125 mg total) by mouth every 6 (six) hours as needed. 30 tablet 4   Loperamide HCl (IMODIUM PO) Take by mouth as needed.     loratadine (CLARITIN) 10 MG tablet Take 10 mg by mouth daily.     meloxicam (MOBIC) 15 MG tablet Take 1 tablet (15 mg total) by mouth daily. 90 tablet 3   nystatin-triamcinolone ointment (MYCOLOG)      Omega-3 Fatty Acids (FISH OIL) 1000 MG CAPS Take 1,000 mg by mouth daily.      ondansetron (ZOFRAN) 4 MG tablet Take 1 tablet (4 mg total) by mouth every 8 (eight) hours as needed for nausea or vomiting. 20 tablet 0   OVER THE COUNTER MEDICATION Take 250 mg by mouth daily. Hemp oil/ 1 drop under tongue     OVER THE COUNTER MEDICATION Apply 1 application topically in the morning and at bedtime. Roll on/ Hemp oil 1000 mg     pantoprazole (PROTONIX) 40 MG tablet Take 1 tablet (40 mg total) by mouth daily. 90 tablet 3   Probiotic Product (PROBIOTIC & ACIDOPHILUS EX ST PO) Take 1 capsule by mouth at bedtime.      traMADol (ULTRAM) 50 MG tablet Take 1 tablet (50 mg total) by mouth every 8 (eight) hours as needed (mild pain). 90 tablet 2   triamcinolone cream (KENALOG) 0.1 %      No current facility-administered medications on  file prior to visit.    Allergies  Allergen Reactions   Codeine Nausea And Vomiting   Sulfa Antibiotics     Hair loss, yellowed skin   Doxycycline Hyclate Itching    Family History  Problem Relation Age of Onset   Heart disease Mother        CHF   Cancer Mother        Uterine   Osteoporosis Mother    COPD Sister    Asthma Sister    Migraines Sister    PE: BP 120/78    Pulse 70    Ht 5\' 1"  (1.549 m)    Wt 139 lb 12.8 oz (63.4 kg)    SpO2 98%    BMI 26.41 kg/m  Wt Readings from Last 3 Encounters:  01/25/20 139 lb 12.8 oz (63.4 kg)  01/18/20 140 lb (63.5 kg)  10/18/19 150 lb 4 oz (68.2 kg)   Constitutional: normal weight, in NAD Eyes: PERRLA, EOMI, no exophthalmos ENT: moist mucous membranes, no thyromegaly, no cervical lymphadenopathy Cardiovascular: RRR, No MRG, + B LE edema (wears compression hoses) Respiratory: CTA B Gastrointestinal: abdomen soft, NT, ND, BS+ Musculoskeletal: no deformities, strength intact in all 4 Skin: moist, warm, no rashes Neurological: + Mild tremor with outstretched hands, DTR normal in all 4  ASSESSMENT: 1. Adrenal incidentaloma  2.  Primary hyperparathyroidism  3.  Vitamin D deficiency  4.  Follicular variant of papillary thyroid cancer  5. S/p right thyroid lobectomy  6.  Osteopenia  PLAN:  1. Patient with a history of right adrenal nodule discovered incidentally. This had necrotic areas and scattered areas of calcifications, and measured 5 x 4.5 cm.  The PET CT scan did not show increased FDG uptake to suggest malignancy.  We ruled out adrenal hormone hyper secretion and I cleared her for surgery.  She had surgery 2 months before our last visit and the pathology was benign (cavernous hemangioma).  After surgery, her right leg pain has resolved. -Postsurgical cosyntropin stimulation test was  normal, ruling out adrenal insufficiency -No further investigation is needed for this  2.  Primary hyperparathyroidism -Patient  with history of hypercalcemia and nonsuppressed PTH, with technetium sestamibi scan positive for a R inf.  Adenoma.  This was resected and her calcium decreased to normal after the surgery. -Latest calcium and PTH levels were normal.  She had another calcium level checked earlier this month and this was also normal. -We will repeat these at next visit  3.  Vitamin D deficiency -Her vitamin D level was slightly low in 2019 so we restarted vitamin D 3000 units daily. At this visit, she is on a higher dose, 4000 units daily, dose increased at the end of last month -Latest vitamin D level was normal in 07/2019 -We will repeat this at next visit  4.  Follicular variant of papillary thyroid cancer -We discussed that her cancer is low risk since it was smaller than 2 cm and without any signs of extension or invasion in the lymphovascular system -We discussed that in this case, completion thyroidectomy is not mandatory -We can continue to follow her with another ultrasound in a year from the surgery (after 05/2020)  5. S/p right thyroid lobectomy -Latest TFTs were normal in 09/2019 -We will repeat these today -She does not complain of any hypothyroid symptoms -We discussed about how to take levothyroxine in case she needs to start: every day, with water, at least 30 minutes before breakfast, separated by at least 4 hours from: - acid reflux medications - calcium - iron - multivitamins  6.  Osteopenia -Reviewed together latest DXA scan reports from 2018 and 2019: T-scores were low, and significantly decreased at last check -We did discuss that after her parathyroid surgery, her T-scores are likely to improve -We will repeat another bone density scan in 04/2019, 2 years from the previous -I advised her that this needs to be ordered by PCP to be done on the same machine  Component     Latest Ref Rng & Units 01/25/2020  TSH     0.35 - 4.50 uIU/mL 2.64  T4,Free(Direct)     0.60 - 1.60 ng/dL 5.39   Triiodothyronine,Free,Serum     2.3 - 4.2 pg/mL 3.4  Normal TFTs.  Carlus Pavlov, MD PhD [facility] Endocr[facility]ology

## 2020-01-29 ENCOUNTER — Telehealth: Payer: Self-pay | Admitting: Family Medicine

## 2020-01-29 NOTE — Telephone Encounter (Signed)
Patient's urine drug screen shows both oxycodone and tramadol, these cannot be mixed, I know she did get a prescription for the other a little bit ago from a specialist, she wants to continue to get tramadol from [name] then she cannot be mixing the 2, we will do a drug screen again in the future and if both show up again then we will be unable to continue to prescribe.  She cannot fill prescriptions outside of our office without our permission for controlled substances. Caryl Pina, MD [facility] 01/29/2020, 10:28 PM

## 2020-01-30 NOTE — Telephone Encounter (Signed)
Patient aware.

## 2020-02-14 ENCOUNTER — Other Ambulatory Visit: Payer: Self-pay | Admitting: Family Medicine

## 2020-02-14 DIAGNOSIS — I10 Essential (primary) hypertension: Secondary | ICD-10-CM

## 2020-02-15 ENCOUNTER — Encounter: Payer: Self-pay | Admitting: *Deleted

## 2020-02-20 DIAGNOSIS — K5904 Chronic idiopathic constipation: Secondary | ICD-10-CM | POA: Diagnosis not present

## 2020-02-20 DIAGNOSIS — Z9889 Other specified postprocedural states: Secondary | ICD-10-CM | POA: Diagnosis not present

## 2020-02-20 DIAGNOSIS — K5790 Diverticulosis of intestine, part unspecified, without perforation or abscess without bleeding: Secondary | ICD-10-CM | POA: Diagnosis not present

## 2020-02-20 DIAGNOSIS — Z8719 Personal history of other diseases of the digestive system: Secondary | ICD-10-CM | POA: Diagnosis not present

## 2020-02-25 DIAGNOSIS — K5904 Chronic idiopathic constipation: Secondary | ICD-10-CM | POA: Diagnosis not present

## 2020-02-26 ENCOUNTER — Telehealth: Payer: Self-pay

## 2020-02-26 NOTE — Telephone Encounter (Signed)
Patient reports that her Tramadol was sent to [facility] in [facility] and she wanted it to be sent to [facility].  I explained to the patient that the contract she signed in January for her pain medication stated she would use [facility] and we could not send it anywhere else.  Patient voices understanding.

## 2020-03-14 ENCOUNTER — Ambulatory Visit (INDEPENDENT_AMBULATORY_CARE_PROVIDER_SITE_OTHER): Payer: Medicare HMO | Admitting: Family

## 2020-03-14 ENCOUNTER — Encounter: Payer: Self-pay | Admitting: Family

## 2020-03-14 DIAGNOSIS — J069 Acute upper respiratory infection, unspecified: Secondary | ICD-10-CM | POA: Diagnosis not present

## 2020-03-14 MED ORDER — PREDNISONE 10 MG (21) PO TBPK: ORAL_TABLET | ORAL | 0 refills | Status: DC

## 2020-03-14 NOTE — Progress Notes (Signed)
   Virtual Visit via telephone Note Due to COVID-19 pandemic this visit was conducted virtually. This visit type was conducted due to national recommendations for restrictions regarding the COVID-19 Pandemic (e.g. social distancing, sheltering in place) in an effort to limit this patient's exposure and mitigate transmission in our community. All issues noted in this document were discussed and addressed.  A physical exam was not performed with this format.  I connected with Maria Moss on 03/14/20 at 1:04 pm  by telephone and verified that I am speaking with the correct person using two identifiers. Maria Moss is currently located at outside and no one is currently with her during visit. The provider, [name], FNP is located in their office at time of visit.  I discussed the limitations, risks, security and privacy concerns of performing an evaluation and management service by telephone and the availability of in person appointments. I also discussed with the patient that there may be a patient responsible charge related to this service. The patient expressed understanding and agreed to proceed.   History and Present Illness: Pt calls the office today with complaints of cough and URI symptoms that started on 03/08/20.  She has tried OTC medications without relief.  Cough This is a new problem. The current episode started in the past 7 days. The problem has been unchanged. The problem occurs every few minutes. The cough is non-productive. Associated symptoms include ear pain, headaches, myalgias, nasal congestion and a sore throat. Pertinent negatives include no chills, ear congestion, fever, shortness of breath or wheezing. She has tried OTC cough suppressant for the symptoms. The treatment provided mild relief.      Review of Systems  Constitutional: Negative for chills and fever.  HENT: Positive for ear pain and sore throat.   Respiratory: Positive for cough. Negative for shortness of  breath and wheezing.   Musculoskeletal: Positive for myalgias.  Neurological: Positive for headaches.  All other systems reviewed and are negative.    Observations/Objective: No SOB or distress noted, hoarse voice   Assessment and Plan: 1. Viral URI with cough - Take meds as prescribed - Use a cool mist humidifier  -Use saline nose sprays frequently -Force fluids -For any cough or congestion  Use plain Mucinex- regular strength or max strength is fine -For fever or aces or pains- take tylenol or ibuprofen. -Throat lozenges if help -Call if symptoms worsen or do not improve  - predniSONE (STERAPRED UNI-PAK 21 TAB) 10 MG (21) TBPK tablet; Use as directed  Dispense: 21 tablet; Refill: 0      I discussed the assessment and treatment plan with the patient. The patient was provided an opportunity to ask questions and all were answered. The patient agreed with the plan and demonstrated an understanding of the instructions.   The patient was advised to call back or seek an in-person evaluation if the symptoms worsen or if the condition fails to improve as anticipated.  The above assessment and management plan was discussed with the patient. The patient verbalized understanding of and has agreed to the management plan. Patient is aware to call the clinic if symptoms persist or worsen. Patient is aware when to return to the clinic for a follow-up visit. Patient educated on when it is appropriate to go to the emergency department.   Time call ended:  1:15 pm   I provided 11 minutes of non-face-to-face time during this encounter.    [name], FNP

## 2020-03-24 ENCOUNTER — Other Ambulatory Visit: Payer: Self-pay | Admitting: Family Medicine

## 2020-03-24 DIAGNOSIS — G8929 Other chronic pain: Secondary | ICD-10-CM

## 2020-03-24 DIAGNOSIS — F411 Generalized anxiety disorder: Secondary | ICD-10-CM

## 2020-03-24 DIAGNOSIS — M545 Low back pain, unspecified: Secondary | ICD-10-CM

## 2020-04-03 ENCOUNTER — Other Ambulatory Visit: Payer: Self-pay | Admitting: Family Medicine

## 2020-04-03 DIAGNOSIS — K219 Gastro-esophageal reflux disease without esophagitis: Secondary | ICD-10-CM

## 2020-04-18 ENCOUNTER — Ambulatory Visit: Payer: Medicare HMO | Admitting: Family Medicine

## 2020-04-21 ENCOUNTER — Ambulatory Visit: Payer: Medicare HMO | Admitting: Family Medicine

## 2020-04-30 ENCOUNTER — Other Ambulatory Visit: Payer: Self-pay

## 2020-04-30 ENCOUNTER — Encounter: Payer: Self-pay | Admitting: Family Medicine

## 2020-04-30 ENCOUNTER — Ambulatory Visit (INDEPENDENT_AMBULATORY_CARE_PROVIDER_SITE_OTHER): Payer: Medicare HMO

## 2020-04-30 ENCOUNTER — Ambulatory Visit (INDEPENDENT_AMBULATORY_CARE_PROVIDER_SITE_OTHER): Payer: Medicare HMO | Admitting: Family Medicine

## 2020-04-30 ENCOUNTER — Other Ambulatory Visit: Payer: Medicare HMO

## 2020-04-30 VITALS — BP 149/79 | HR 82 | Ht 61.0 in | Wt 146.0 lb

## 2020-04-30 DIAGNOSIS — Z780 Asymptomatic menopausal state: Secondary | ICD-10-CM

## 2020-04-30 DIAGNOSIS — E782 Mixed hyperlipidemia: Secondary | ICD-10-CM

## 2020-04-30 DIAGNOSIS — M5136 Other intervertebral disc degeneration, lumbar region: Secondary | ICD-10-CM | POA: Diagnosis not present

## 2020-04-30 DIAGNOSIS — I10 Essential (primary) hypertension: Secondary | ICD-10-CM

## 2020-04-30 MED ORDER — AMLODIPINE BESYLATE 5 MG PO TABS: 1.0000 | ORAL_TABLET | Freq: Every day | ORAL | 3 refills | Status: DC

## 2020-04-30 MED ORDER — TRAMADOL HCL 50 MG PO TABS: 50.0000 mg | ORAL_TABLET | Freq: Three times a day (TID) | ORAL | 2 refills | Status: DC | PRN

## 2020-04-30 NOTE — Progress Notes (Signed)
BP (!) 149/79   Pulse 82   Ht 5\' 1"  (1.549 m)   Wt 146 lb (66.2 kg)   SpO2 99%   BMI 27.59 kg/m    Subjective:   Patient ID: Maria Moss, female    DOB: [birth year], 70 y.o.   MRN: [id number]  HPI: Maria Moss is a 70 y.o. female presenting on 04/30/2020 for Medical Management of Chronic Issues, Hyperlipidemia, and Hypertension   HPI Pain assessment: Cause of pain-degenerative disc disease lumbar Pain location-lower back and down legs Pain on scale of 1-10- 5 Frequency-Daily, worse at night What increases pain-laying down or sitting for long periods What makes pain Better-movement and tramadol Effects on ADL -limits walking some because of pain Any change in general medical condition-none  Current opioids rx-tramadol 50 mg 3 times daily as needed # meds rx-90 Effectiveness of current meds-works well, sometimes only uses twice a day but sometimes these 3 Adverse reactions from pain meds-none Morphine equivalent-15  Pill count performed-No Last drug screen -01/31/2020 ( high risk q31m, moderate risk q43m, low risk yearly ) Urine drug screen today- No Was the [facility] reviewed-yes  If yes were their any concerning findings? -None  No flowsheet data found.   Pain contract signed on: 01/31/2020  Hypertension Patient is currently on furosemide and amlodipine, and their blood pressure today is 149/79. Patient denies any lightheadedness or dizziness. Patient denies headaches, blurred vision, chest pains, shortness of breath, or weakness. Denies any side effects from medication and is content with current medication.   Hyperlipidemia Patient is coming in for recheck of his hyperlipidemia. The patient is currently taking fish oil. They deny any issues with myalgias or history of liver damage from it. They deny any focal numbness or weakness or chest pain.   Relevant past medical, surgical, family and social history reviewed and updated as indicated. Interim medical history since our  last visit reviewed. Allergies and medications reviewed and updated.  Review of Systems  Constitutional: Negative for chills and fever.  Eyes: Negative for visual disturbance.  Respiratory: Negative for chest tightness and shortness of breath.   Cardiovascular: Negative for chest pain and leg swelling.  Genitourinary: Negative for difficulty urinating and dysuria.  Musculoskeletal: Positive for back pain. Negative for gait problem.  Skin: Negative for rash.  Neurological: Negative for dizziness, light-headedness and headaches.  Psychiatric/Behavioral: Negative for agitation and behavioral problems.  All other systems reviewed and are negative.   Per HPI unless specifically indicated above   Allergies as of 04/30/2020      Reactions   Codeine Nausea And Vomiting   Sulfa Antibiotics    Hair loss, yellowed skin   Doxycycline Hyclate Itching      Medication List       Accurate as of April 30, 2020  3:30 PM. If you have any questions, ask your nurse or doctor.        STOP taking these medications   alum & mag hydroxide-simeth 400-400-40 MG/5ML suspension Commonly known as: MAALOX PLUS Stopped by: Elige Radon Koury Roddy, MD   diclofenac Sodium 1 % Gel Commonly known as: VOLTAREN Stopped by: Nils Pyle, MD   nystatin-triamcinolone ointment Commonly known as: MYCOLOG Stopped by: Nils Pyle, MD   OVER THE COUNTER MEDICATION Stopped by: Nils Pyle, MD   OVER THE COUNTER MEDICATION Stopped by: Elige Radon Jamee Keach, MD   predniSONE 10 MG (21) Tbpk tablet Commonly known as: STERAPRED UNI-PAK 21 TAB Stopped by: Elige Radon [name],  MD   triamcinolone cream 0.1 % Commonly known as: KENALOG Stopped by: Elige Radon Savien Mamula, MD     TAKE these medications   acetaminophen 500 MG tablet Commonly known as: TYLENOL Take by mouth.   amLODipine 5 MG tablet Commonly known as: NORVASC Take 1 tablet (5 mg total) by mouth daily.   cetirizine 10 MG  tablet Commonly known as: ZYRTEC Take 10 mg by mouth daily.   Cholecalciferol 25 MCG (1000 UT) tablet Take 3,000 Units by mouth daily.   dicyclomine 10 MG capsule Commonly known as: BENTYL Take 10 mg by mouth in the morning and at bedtime.   DULoxetine 60 MG capsule Commonly known as: CYMBALTA TAKE 1 CAPSULE EVERY DAY   famotidine 40 MG tablet Commonly known as: PEPCID 40 mg 2 (two) times daily. What changed: Another medication with the same name was removed. Continue taking this medication, and follow the directions you see here. Changed by: Elige Radon Arhianna Ebey, MD   Fish Oil 1000 MG Caps Take 1,000 mg by mouth daily.   fluticasone 50 MCG/ACT nasal spray Commonly known as: FLONASE USE 2 SPRAYS IN EACH NOSTRIL EVERY DAY   furosemide 20 MG tablet Commonly known as: LASIX Take 1 tablet (20 mg total) by mouth daily. What changed: Another medication with the same name was removed. Continue taking this medication, and follow the directions you see here. Changed by: Elige Radon Silvino Selman, MD   HEMP OIL-VANILLYL BUTYL ETHER EX Apply topically.   hyoscyamine 0.125 MG Tbdp disintergrating tablet Commonly known as: ANASPAZ Take 1 tablet (0.125 mg total) by mouth every 6 (six) hours as needed.   IMODIUM PO Take by mouth as needed.   loratadine 10 MG tablet Commonly known as: CLARITIN Take 10 mg by mouth daily.   meloxicam 15 MG tablet Commonly known as: MOBIC TAKE 1 TABLET EVERY DAY   ondansetron 4 MG tablet Commonly known as: Zofran Take 1 tablet (4 mg total) by mouth every 8 (eight) hours as needed for nausea or vomiting.   pantoprazole 40 MG tablet Commonly known as: PROTONIX TAKE 1 TABLET (40 MG TOTAL) BY MOUTH DAILY.   PROBIOTIC & ACIDOPHILUS EX ST PO Take 1 capsule by mouth at bedtime.   traMADol 50 MG tablet Commonly known as: ULTRAM Take 1 tablet (50 mg total) by mouth every 8 (eight) hours as needed (mild pain).        Objective:   BP (!) 149/79    Pulse 82   Ht 5\' 1"  (1.549 m)   Wt 146 lb (66.2 kg)   SpO2 99%   BMI 27.59 kg/m   Wt Readings from Last 3 Encounters:  04/30/20 146 lb (66.2 kg)  01/25/20 139 lb 12.8 oz (63.4 kg)  01/18/20 140 lb (63.5 kg)    Physical Exam Vitals and nursing note reviewed.  Constitutional:      General: She is not in acute distress.    Appearance: She is well-developed. She is not diaphoretic.  Eyes:     Conjunctiva/sclera: Conjunctivae normal.  Cardiovascular:     Rate and Rhythm: Normal rate and regular rhythm.     Heart sounds: Normal heart sounds. No murmur heard.   Pulmonary:     Effort: Pulmonary effort is normal. No respiratory distress.     Breath sounds: Normal breath sounds. No wheezing.  Musculoskeletal:        General: Tenderness (Bilateral lumbar tenderness) and deformity (Scoliosis, concave left lumbar) present.  Skin:    General: Skin  is warm and dry.     Findings: No rash.  Neurological:     Mental Status: She is alert and oriented to person, place, and time.     Coordination: Coordination normal.  Psychiatric:        Behavior: Behavior normal.       Assessment & Plan:   Problem List Items Addressed This Visit      Cardiovascular and Mediastinum   Hypertension - Primary (Chronic)   Relevant Medications   amLODipine (NORVASC) 5 MG tablet     Musculoskeletal and Integument   Degenerative disc disease, lumbar (Chronic)   Relevant Medications   traMADol (ULTRAM) 50 MG tablet     Other   Hyperlipemia (Chronic)   Relevant Medications   amLODipine (NORVASC) 5 MG tablet    Other Visit Diagnoses    Essential hypertension  (Chronic)      Relevant Medications   amLODipine (NORVASC) 5 MG tablet   Postmenopausal       Relevant Orders   DG [facility] DEXA      Continue current medication, seems to be doing okay on it.  Noted check blood work today, will do next time. Follow up plan: Return in about 3 months (around 07/30/2020), or if symptoms worsen or fail to  improve, for Hypertension and cholesterol and back pain.  Counseling provided for all of the vaccine components Orders Placed This Encounter  Procedures  . DG [facility] DEXA    Arville Care, MD [facility] 04/30/2020, 3:30 PM

## 2020-05-01 DIAGNOSIS — M8589 Other specified disorders of bone density and structure, multiple sites: Secondary | ICD-10-CM | POA: Diagnosis not present

## 2020-05-01 DIAGNOSIS — Z780 Asymptomatic menopausal state: Secondary | ICD-10-CM | POA: Diagnosis not present

## 2020-05-05 ENCOUNTER — Telehealth: Payer: Self-pay

## 2020-05-05 NOTE — Telephone Encounter (Signed)
The results have not been reviewed. Informed pt that we would call her once [facility] looks at them.

## 2020-05-06 ENCOUNTER — Telehealth: Payer: Self-pay

## 2020-05-06 NOTE — Telephone Encounter (Signed)
Patient was confused and had the name of the blood pressure medication mixed up with her Bentyl.  She was instructed she is to take Amlodipine once daily and her Bentyl twice daily as she has been.  Patient voiced understanding.

## 2020-05-13 ENCOUNTER — Telehealth: Payer: Self-pay

## 2020-05-13 NOTE — Telephone Encounter (Signed)
Patient is calling for DEXA results - they are in chart - please review and advise.

## 2020-07-09 ENCOUNTER — Other Ambulatory Visit: Payer: Self-pay | Admitting: Family Medicine

## 2020-07-25 ENCOUNTER — Ambulatory Visit (INDEPENDENT_AMBULATORY_CARE_PROVIDER_SITE_OTHER): Payer: Medicare HMO | Admitting: Internal Medicine

## 2020-07-25 ENCOUNTER — Other Ambulatory Visit: Payer: Self-pay

## 2020-07-25 ENCOUNTER — Encounter: Payer: Self-pay | Admitting: Internal Medicine

## 2020-07-25 VITALS — BP 120/72 | HR 76 | Ht 61.0 in | Wt 147.8 lb

## 2020-07-25 DIAGNOSIS — E890 Postprocedural hypothyroidism: Secondary | ICD-10-CM

## 2020-07-25 DIAGNOSIS — E559 Vitamin D deficiency, unspecified: Secondary | ICD-10-CM

## 2020-07-25 DIAGNOSIS — E278 Other specified disorders of adrenal gland: Secondary | ICD-10-CM | POA: Diagnosis not present

## 2020-07-25 DIAGNOSIS — Z85850 Personal history of malignant neoplasm of thyroid: Secondary | ICD-10-CM

## 2020-07-25 DIAGNOSIS — E213 Hyperparathyroidism, unspecified: Secondary | ICD-10-CM

## 2020-07-25 MED ORDER — ALENDRONATE SODIUM 70 MG PO TABS: 70.0000 mg | ORAL_TABLET | ORAL | 3 refills | Status: DC

## 2020-07-25 NOTE — Progress Notes (Addendum)
Patient ID: [name], female   DOB: [birth year], 70 y.o.   MRN: [id number]   This visit occurred during the SARS-CoV-2 public health emergency.  Safety protocols were in place, including screening questions prior to the visit, additional usage of staff PPE, and extensive cleaning of exam room while observing appropriate contact time as indicated for disinfecting solutions.   HPI  Maria Moss is a 70 y.o.-year-old female, initially referred by Dr. Rosendo Gros [facility]), presenting for follow-up for a R adrenal incidentaloma.  She also has a history of primary hyperparathyroidism and a right thyroid nodule which turned out to be a follicular variant of papillary thyroid cancer.  Last visit 6 months ago.  She is here with her husband.  Interim history: She has persistent gas after her hiatal hernia 11/2019. Also, some dysphagia and weakness.   Right adrenal incidentaloma: Reviewed previous imaging studies and investigations: Lumbar MRI (10/28/2017): 5.2 x 4.4 R renal or adrenal heterogeneous soft tissue mass  CT abdomen w/ and w/o contrast (11/04/2017): R adrenal 5 cm complex necrotic appearing and enhancing mass with nodular areas of contrast enhancement and scattered areas of calcification >> possible RCC vs. metastasis  PET CT (11/18/2017):  CHEST: No hypermetabolic mediastinal or hilar nodes. No suspicious pulmonary nodules on the CT scan. ABDOMEN/PELVIS: Large RIGHT renal mass again noted measuring 5.0 by 4.5 cm. Mass has central photopenia and mild peripheral metabolic activity with SUV max equal 3.1 (along the medial border of the mass for example.) This mild activity is similar to liver activity (SUV max equal 3.8). The LEFT adrenal glands normal. No other sites of abnormal FDG uptake.  IMPRESSION: 1. Large RIGHT adrenal mass with central photopenia and mild peripheral metabolic activity. Findings are nonspecific. Recommend surgical consultation for lesions of this size. 2. No  evidence of primary or metastatic carcinoma outside of the adrenal gland.  Of note, she had normal testosterone, estradiol, 24-hour urine cortisol and 24-hour urine metanephrines and catecholamines before the surgery.  After the surgery, a cosyntropin stimulation test was normal.  Please review my last office visit note for more details of the hormonal investigation.  She had right adrenalectomy by Dr. 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  She feels well after surgery.  A cosyntropin stimulation test was normal after surgery and cortisol level remained normal afterwards: Component     Latest Ref Rng & Units 09/01/2018 09/01/2018 09/01/2018 02/02/2019         9:45 AM 10:30 AM 10:58 AM   Cortisol, Plasma     ug/dL 12.3 19.7 22.4 13.3    Vitamin D deficiency:  Reviewed vitamin D levels: Lab Results  Component Value Date   VD25OH 50.2 07/20/2019   VD25OH 73.31 02/02/2019   VD25OH 62.95 07/28/2018   VD25OH 29.1 (L) 04/14/2017   VD25OH 43.9 11/02/2013  She was on 3000 units vitamin D daily, now 4000 IU daily, dose increased  by herself at the end of 12/2019.  Primary hyperparathyroidism:  Reviewed pertinent labs: Lab Results  Component Value Date   PTH 52 07/20/2019   PTH Comment 07/20/2019   PTH 46 03/05/2019   PTH 54 02/02/2019   PTH Comment 02/02/2019   PTH 41 11/10/2017   PTH Comment 11/10/2017   Lab Results  Component Value Date   CALCIUM 9.5 01/18/2020   CALCIUM 9.3 08/23/2019   CALCIUM 9.7 08/10/2019   CALCIUM 9.6 07/20/2019   CALCIUM 8.7 (L) 05/30/2019   CALCIUM 10.0 05/25/2019  CALCIUM 10.6 (H) 04/16/2019   CALCIUM 11.0 (H) 03/05/2019   CALCIUM 10.4 (H) 02/13/2019   CALCIUM 10.5 (H) 02/02/2019   CALCIUM 10.0 10/13/2018   CALCIUM 10.1 08/24/2018   CALCIUM 10.3 08/17/2018   CALCIUM 8.6 (L) 06/01/2018   CALCIUM 9.9 05/22/2018   CALCIUM 10.3 01/27/2018   CALCIUM 10.5 (H) 11/10/2017   CALCIUM 10.4 (H) 10/21/2017   CALCIUM 10.5 (H)  06/02/2017   CALCIUM 10.4 (H) 04/14/2017   CALCIUM 10.4 (H) 12/16/2016   CALCIUM 10.5 (H) 09/24/2016   CALCIUM 10.6 (H) 09/10/2016   CALCIUM 10.2 06/11/2016   CALCIUM 10.3 03/05/2016   CALCIUM 9.8 11/28/2015   CALCIUM 10.3 08/29/2015   CALCIUM 10.1 03/21/2015   CALCIUM 10.4 (H) 11/29/2014   CALCIUM 9.9 05/17/2014   CALCIUM 10.3 02/15/2014   CALCIUM 10.3 07/19/2013   CALCIUM 10.0 03/19/2013   CALCIUM 10.6 (H) 11/22/2012   CALCIUM 10.7 (H) 07/21/2012   PCP suggested to start back on calcium and vitamin D after the bone density results returned 04/2020.  Technetium sestamibi scan (05/03/2019): RIGHT inferior parathyroid adenoma.   Cannot exclude coexistent parathyroid adenoma at the RIGHT superior parathyroid gland.   Potential cold nodule within the RIGHT thyroid lobe near upper pole; consider follow-up thyroid ultrasound assessment to further evaluate.  Thyroid U/S (05/11/2019): Parenchymal Echotexture: Moderately heterogenous Isthmus: 4 mm Right lobe: 6.1 x 2.2 x 3.1 cm Left lobe: 5.1 x 1.2 x 1.6 cm  _______________________________________________________   Estimated total number of nodules >/= 1 cm: 1 _____________________________________________   Nodule # 1: Location: Right; Mid Maximum size: 2.2 cm; Other 2 dimensions: 2.0 x 1.8 cm Composition: spongiform (0) Echogenicity: isoechoic (1) This nodule does NOT meet TI-RADS criteria for biopsy or dedicated follow-up. _________________________________________________________   Moderate thyroid heterogeneity with mixed echogenicity and background pseudo nodularity. No hypervascularity. No regional adenopathy.   IMPRESSION: 2.2 cm right mid thyroid TR 1 spongiform nodule correlates with the cold nodule by the parathyroid scan. This would not meet criteria for any biopsy or follow-up.   Nonspecific thyroid heterogeneity.  She had right inferior parathyroidectomy + right thyroid lobectomy (05/29/2019)-Dr.  Gerkin.  Pathology showed a parathyroid adenoma and calcium decreased from 11 preop to 9.7 postop  (06/11/2019).  PTH postop was 51.  However, surprisingly, pathology of the right thyroid nodule showed  follicular variant of papillary thyroid cancer, measuring 1.7 cm, with negative surgical margins.  Pt denies: - feeling nodules in neck - hoarseness - Dysphagia  - choking - SOB with lying down  Osteopenia per review of the DXA scan reports from 2018 and 2019:  DXA scan ([facility]) from 04/30/2020: L1-L4: n/a RFN: -2.3 LFN: -1.7 33% distal radius, -1.1% (-2.5%*) FRAX: n/a  DXA scan ([facility]) from 04/14/2017: L1-L4: -1.0 RFN: -1.7 LFN: -1.6 33% distal radius: -0.4 FRAX: 15.8%, 2%  DXA scan ([facility]) from 10/24/2016: L1-L4: -0.5 RFN: -1.0 LFN: -1.2  No history of kidney stones.  ROS: + Leg swelling, + joint aches  I reviewed pt's medications, allergies, PMH, social hx, family hx, and changes were documented in the history of present illness. Otherwise, unchanged from my initial visit note.  Past Medical History:  Diagnosis Date   Anemia    iron deficiency   Ankle fracture, left 2006   Diverticula, intestine    Mild case.   Fibromyalgia    Dr. Justine Null Dx in [place] years ago  no meds    GERD (gastroesophageal reflux disease)    History of hiatal hernia  Hyperlipidemia    Hyperparathyroidism ([place])    Hypertension    Dx age 45.    Stroke [facility])    Questionable stoke when born or polio not sure pt. has right side weakness toes right foot turns outward and toes curled up some     Past Surgical History:  Procedure Laterality Date   ANKLE SURGERY Left    Broken.  Has screws and metal plate.   CHOLECYSTECTOMY     PARATHYROIDECTOMY Right 05/29/2019   Procedure: RIGHT INFERIOR PARATHYROIDECTOMY;  Surgeon: Armandina Gemma, MD;  Location: [facility];  Service: General;  Laterality: Right;   ROBOTIC ADRENALECTOMY Right 05/31/2018   Procedure: XI  ROBOTIC RIGHT ADRENALECTOMY;  Surgeon: Ralene Ok, MD;  Location: [facility];  Service: General;  Laterality: Right;   THYROID LOBECTOMY Right 05/29/2019   Procedure: RIGHT THYROID LOBECTOMY;  Surgeon: Armandina Gemma, MD;  Location: [facility];  Service: General;  Laterality: Right;    Social History   Socioeconomic History   Marital status: Married    Spouse name: Not on file   Number of children: Not on file   Years of education: Not on file   Highest education level: Not on file  Occupational History   Not on file  Tobacco Use   Smoking status: Never   Smokeless tobacco: Never  Vaping Use   Vaping Use: Never used  Substance and Sexual Activity   Alcohol use: No   Drug use: No   Sexual activity: Not Currently  Other Topics Concern   Not on file  Social History Narrative   Not on file   Social Determinants of Health   Financial Resource Strain: Not on file  Food Insecurity: Not on file  Transportation Needs: Not on file  Physical Activity: Not on file  Stress: Not on file  Social Connections: Not on file  Intimate Partner Violence: Not on file    Current Outpatient Medications on File Prior to Visit  Medication Sig Dispense Refill   acetaminophen (TYLENOL) 500 MG tablet Take by mouth.     amLODipine (NORVASC) 5 MG tablet Take 1 tablet (5 mg total) by mouth daily. 90 tablet 3   cetirizine (ZYRTEC) 10 MG tablet Take 10 mg by mouth daily.     Cholecalciferol 25 MCG (1000 UT) tablet Take 3,000 Units by mouth daily.      dicyclomine (BENTYL) 10 MG capsule Take 10 mg by mouth in the morning and at bedtime.     DULoxetine (CYMBALTA) 60 MG capsule TAKE 1 CAPSULE EVERY DAY 90 capsule 3   famotidine (PEPCID) 40 MG tablet 40 mg 2 (two) times daily.     fluticasone (FLONASE) 50 MCG/ACT nasal spray USE 2 SPRAYS IN EACH NOSTRIL EVERY DAY 48 g 1   furosemide (LASIX) 20 MG tablet Take 1 tablet (20 mg total) by mouth daily. 90 tablet 3   HEMP OIL-VANILLYL BUTYL ETHER EX Apply topically.      hyoscyamine (ANASPAZ) 0.125 MG TBDP disintergrating tablet Take 1 tablet (0.125 mg total) by mouth every 6 (six) hours as needed. 30 tablet 4   Loperamide HCl (IMODIUM PO) Take by mouth as needed.     loratadine (CLARITIN) 10 MG tablet Take 10 mg by mouth daily.     meloxicam (MOBIC) 15 MG tablet TAKE 1 TABLET EVERY DAY 90 tablet 3   Omega-3 Fatty Acids (FISH OIL) 1000 MG CAPS Take 1,000 mg by mouth daily.      ondansetron (ZOFRAN) 4 MG tablet Take  1 tablet (4 mg total) by mouth every 8 (eight) hours as needed for nausea or vomiting. 20 tablet 0   pantoprazole (PROTONIX) 40 MG tablet TAKE 1 TABLET (40 MG TOTAL) BY MOUTH DAILY. 90 tablet 2   Probiotic Product (PROBIOTIC & ACIDOPHILUS EX ST PO) Take 1 capsule by mouth at bedtime.      traMADol (ULTRAM) 50 MG tablet Take 1 tablet (50 mg total) by mouth every 8 (eight) hours as needed (mild pain). 90 tablet 2   No current facility-administered medications on file prior to visit.    Allergies  Allergen Reactions   Codeine Nausea And Vomiting   Sulfa Antibiotics     Hair loss, yellowed skin   Doxycycline Hyclate Itching    Family History  Problem Relation Age of Onset   Heart disease Mother        CHF   Cancer Mother        Uterine   Osteoporosis Mother    COPD Sister    Asthma Sister    Migraines Sister    PE: BP 120/72 (BP Location: Right Arm, Patient Position: Sitting, Cuff Size: Normal)   Pulse 76   Ht '5\' 1"'$  (1.549 m)   Wt 147 lb 12.8 oz (67 kg)   SpO2 95%   BMI 27.93 kg/m  Wt Readings from Last 3 Encounters:  08/08/20 147 lb (66.7 kg)  07/25/20 147 lb 12.8 oz (67 kg)  04/30/20 146 lb (66.2 kg)   Constitutional: normal weight, in NAD Eyes: PERRLA, EOMI, no exophthalmos ENT: moist mucous membranes, no neck masses palpable, no cervical lymphadenopathy Cardiovascular: RRR, No MRG, + B LE edema (wears compression hoses) Respiratory: CTA B Gastrointestinal: abdomen soft, NT, ND, BS+ Musculoskeletal: no deformities,  strength intact in all 4 Skin: moist, warm, no rashes Neurological: + Mild tremor with outstretched hands, DTR normal in all 4  ASSESSMENT: 1. Adrenal incidentaloma  2.  Primary hyperparathyroidism  3.  Vitamin D deficiency  4.  Follicular variant of papillary thyroid cancer  5. S/p right thyroid lobectomy  6.  Osteopenia  PLAN:  1. Patient with a history of right adrenal nodule discovered incidentally. This had necrotic areas and scattered areas of calcifications, and measured 5 x 4.5 cm.  The PET CT scan did not show increased FDG uptake to suggest malignancy.  We ruled out adrenal hormone hyper secretion and I cleared her for surgery.  She had mass resected and the pathology was benign (cavernous hemangioma).  After surgery, her right leg pain resolved. -Postsurgical cosyntropin stimulation test was normal, ruling out adrenal insufficiency -Further investigation needed for this  2.  Primary hyperparathyroidism -Patient with history of hypercalcemia and nonsuppressed PTH, with technetium sestamibi scan positive for a R inf.  Adenoma.   this was resected and calcium decreased to normal after surgery. -Calcium and PTH levels were normal postop.  Latest calcium was reviewed from 01/2020 and this was normal. -We will not repeat it today  3.  Vitamin D deficiency -Her vitamin D level was slightly low in 2019 so we restarted vitamin D 3000 units daily.  She then increase to 4000 units daily, which she continues today -Latest vitamin D level was normal a year ago -We will repeat now -Per her preference, she will have this drawn at her PCPs office.  4.  Follicular variant of papillary thyroid cancer -We discussed that her thyroid cancer is low risk since it was smaller than 2 cm and without any signs of extension  or invasion in the lymphovascular system -Because of the low risk thyroid cancer, completion thyroidectomy was not mandatory -At today's visit, she has no neck compression  symptoms.  She does feel a sensation of pulling in her neck, most likely due to adherences.  No masses palpated in her neck today. -We will repeat another ultrasound, now, approximately a year from the surgery  5. S/p right thyroid lobectomy -Latest TFTs within normal at last visit, in 01/2020 -We will repeat these at next lab draw -No hypothyroid symptoms -At last visit, we discussed about how to take levothyroxine in case she needs to start: every day, with water, at least 30 minutes before breakfast, separated by at least 4 hours from: - acid reflux medications - calcium - iron - multivitamins  6.  Osteopenia -We reviewed together the DXA scan report from 2018 and 2019: T-scores were low, significantly decreased on the last bone density scan -We discussed that after her parathyroid surgery, her T-scores were likely to improve -She had another DXA scan checked 04/30/2020 and the right femoral neck bone density was significantly worse, as well as her 33% distal radius BMD.  We reviewed the above report together and we discussed about options for treatment.  I suggested oral alendronate once a week and discussed how to take it correctly.  She agrees to try this.  I advised her to let me know if she has problems tolerating it (due to her previous hiatal hernia surgery). -Plan to repeat another DXA scan in 2 years after the previous -I will see her in 6 months but afterwards, we can probably space the visits 1 year apart.  Orders Placed This Encounter  Procedures   US THYROID   TSH   T4, free   T3, free   VITAMIN D 25 Hydroxy (Vit-D Deficiency, Fractures)   Thyroid U/S (08/29/2020): Prior right-sided hemithyroidectomy. Pathology reports papillary thyroid carcinoma follicular variant  Parenchymal Echotexture: Mildly heterogenous Isthmus: 0.2 cm Right lobe: Surgically absent Left lobe: 4.6 cm x 1.4 cm x 1.6 cm _________________________________________________________  Nodule #  1: Location: Left; Inferior Maximum size: 1.9 cm; Other 2 dimensions: 1.2 cm x 1.2 cm Composition: cannot determine (2) Echogenicity: hypoechoic (2) Nodule meets criteria for biopsy _________________________________________________________  Rounded hypoechoic nodule in the left cervical nodal station, presumably a lymph node measuring 1.1 cm in long axis.  IMPRESSION: Surgical changes of right hemithyroidectomy.  Left inferior thyroid nodule meets criteria for biopsy, as designated by the newly established ACR TI-RADS criteria, and referral for biopsy is recommended.  Hypoechoic nodule in the left cervical nodal station, nonspecific though presumably a lymph node, not enlarged.  Patient has a left inferior thyroid nodule that will need to be biopsied.  I will check with her first and then refer her to [facility] imaging for this.  Philemon Kingdom, MD PhD [facility] Endocrinology

## 2020-07-25 NOTE — Patient Instructions (Addendum)
Please have labs at [facility].  If we need to start thyroid hormone (Levothyroxine), Take it every day, with water, at least 30 minutes before breakfast, separated by at least 4 hours from: - acid reflux medications - calcium - iron - multivitamins  Start Alendronate 70 mg before breakfast weekly, with a full glass of water.  Please come back for a follow-up appointment in 6 months.

## 2020-08-08 ENCOUNTER — Encounter: Payer: Self-pay | Admitting: Family Medicine

## 2020-08-08 ENCOUNTER — Other Ambulatory Visit: Payer: Self-pay

## 2020-08-08 ENCOUNTER — Ambulatory Visit (INDEPENDENT_AMBULATORY_CARE_PROVIDER_SITE_OTHER): Payer: Medicare HMO | Admitting: Family Medicine

## 2020-08-08 VITALS — BP 144/81 | HR 79 | Ht 61.0 in | Wt 147.0 lb

## 2020-08-08 DIAGNOSIS — F411 Generalized anxiety disorder: Secondary | ICD-10-CM

## 2020-08-08 DIAGNOSIS — E782 Mixed hyperlipidemia: Secondary | ICD-10-CM

## 2020-08-08 DIAGNOSIS — Z85850 Personal history of malignant neoplasm of thyroid: Secondary | ICD-10-CM | POA: Diagnosis not present

## 2020-08-08 DIAGNOSIS — I10 Essential (primary) hypertension: Secondary | ICD-10-CM | POA: Diagnosis not present

## 2020-08-08 DIAGNOSIS — E559 Vitamin D deficiency, unspecified: Secondary | ICD-10-CM | POA: Diagnosis not present

## 2020-08-08 DIAGNOSIS — M5136 Other intervertebral disc degeneration, lumbar region: Secondary | ICD-10-CM

## 2020-08-08 DIAGNOSIS — E890 Postprocedural hypothyroidism: Secondary | ICD-10-CM | POA: Diagnosis not present

## 2020-08-08 MED ORDER — TRAMADOL HCL 50 MG PO TABS: 50.0000 mg | ORAL_TABLET | Freq: Three times a day (TID) | ORAL | 1 refills | Status: DC | PRN

## 2020-08-08 NOTE — Progress Notes (Signed)
 BP (!) 144/81   Pulse 79   Ht 5' 1" (1.549 m)   Wt 147 lb (66.7 kg)   SpO2 97%   BMI 27.78 kg/m    Subjective:   Patient ID: Maria Moss, female    DOB: 08/30/1950, 70 y.o.   MRN: [id number]  HPI: Maria Moss is a 70 y.o. female presenting on 08/08/2020 for Medical Management of Chronic Issues, Hypertension, and Hyperlipidemia   HPI Hypertension Patient is currently on amlodipine and furosemide, and their blood pressure today is 144/81. Patient denies any lightheadedness or dizziness. Patient denies headaches, blurred vision, chest pains, shortness of breath, or weakness. Denies any side effects from medication and is content with current medication.   Hyperlipidemia Patient is coming in for recheck of his hyperlipidemia. The patient is currently taking fish oils. They deny any issues with myalgias or history of liver damage from it. They deny any focal numbness or weakness or chest pain.   Degenerative disc disease lumbarPain assessment: Cause of pain-degenerative disc disease causes pain in the lower back and down her right leg Pain location-  Pain on scale of 1-10- 6 Frequency-Daily What increases pain-certain movements, has been worsening more recently What makes pain Better-tramadol helps some but not as much as it used to, she had increased to start taking 3-day Effects on ADL -limits ability to walk and be on her feet for long periods Any change in general medical condition-none  Current opioids rx-tramadol 50 mg 3 times daily as needed # meds rx-90 Effectiveness of current meds-helps some Adverse reactions from pain meds-constipation Morphine equivalent-15  Pill count performed-No Last drug screen -01/31/2020 ( high risk q3m, moderate risk q6m, low risk yearly ) Urine drug screen today- No Was the N[facility]SR reviewed-yes  If yes were their any concerning findings? -None  No flowsheet data found.   Pain contract signed on: 01/31/2020   Relevant past medical, surgical,  family and social history reviewed and updated as indicated. Interim medical history since our last visit reviewed. Allergies and medications reviewed and updated.  Review of Systems  Constitutional:  Negative for chills and fever.  Eyes:  Negative for visual disturbance.  Respiratory:  Negative for chest tightness and shortness of breath.   Cardiovascular:  Negative for chest pain and leg swelling.  Gastrointestinal:  Negative for abdominal pain.  Musculoskeletal:  Positive for arthralgias, back pain, gait problem and myalgias.  Skin:  Negative for rash.  Neurological:  Negative for light-headedness and headaches.  Psychiatric/Behavioral:  Negative for agitation and behavioral problems.   All other systems reviewed and are negative.  Per HPI unless specifically indicated above   Allergies as of 08/08/2020       Reactions   Codeine Nausea And Vomiting   Sulfa Antibiotics    Hair loss, yellowed skin   Doxycycline Hyclate Itching        Medication List        Accurate as of August 08, 2020  9:55 AM. If you have any questions, ask your nurse or doctor.          acetaminophen 500 MG tablet Commonly known as: TYLENOL Take by mouth.   alendronate 70 MG tablet Commonly known as: FOSAMAX Take 1 tablet (70 mg total) by mouth every 7 (seven) days. Take with a full glass of water on an empty stomach.   amLODipine 5 MG tablet Commonly known as: NORVASC Take 1 tablet (5 mg total) by mouth daily.   cetirizine 10   BP (!) 144/81   Pulse 79   Ht 5' 1" (1.549 m)   Wt 147 lb (66.7 kg)   SpO2 97%   BMI 27.78 kg/m    Subjective:   Patient ID: Maria Moss, female    DOB: [birth year], 70 y.o.   MRN: [id number]  HPI: Maria Moss is a 70 y.o. female presenting on 08/08/2020 for Medical Management of Chronic Issues, Hypertension, and Hyperlipidemia   HPI Hypertension Patient is currently on amlodipine and furosemide, and their blood pressure today is 144/81. Patient denies any lightheadedness or dizziness. Patient denies headaches, blurred vision, chest pains, shortness of breath, or weakness. Denies any side effects from medication and is content with current medication.   Hyperlipidemia Patient is coming in for recheck of his hyperlipidemia. The patient is currently taking fish oils. They deny any issues with myalgias or history of liver damage from it. They deny any focal numbness or weakness or chest pain.   Degenerative disc disease lumbarPain assessment: Cause of pain-degenerative disc disease causes pain in the lower back and down her right leg Pain location-  Pain on scale of 1-10- 6 Frequency-Daily What increases pain-certain movements, has been worsening more recently What makes pain Better-tramadol helps some but not as much as it used to, she had increased to start taking 3-day Effects on ADL -limits ability to walk and be on her feet for long periods Any change in general medical condition-none  Current opioids rx-tramadol 50 mg 3 times daily as needed # meds rx-90 Effectiveness of current meds-helps some Adverse reactions from pain meds-constipation Morphine equivalent-15  Pill count performed-No Last drug screen -01/31/2020 ( high risk q62m moderate risk q637mlow risk yearly ) Urine drug screen today- No Was the [place]yes  If yes were their any concerning findings? -None  No flowsheet data found.   Pain contract signed on: 01/31/2020   Relevant past medical, surgical,  family and social history reviewed and updated as indicated. Interim medical history since our last visit reviewed. Allergies and medications reviewed and updated.  Review of Systems  Constitutional:  Negative for chills and fever.  Eyes:  Negative for visual disturbance.  Respiratory:  Negative for chest tightness and shortness of breath.   Cardiovascular:  Negative for chest pain and leg swelling.  Gastrointestinal:  Negative for abdominal pain.  Musculoskeletal:  Positive for arthralgias, back pain, gait problem and myalgias.  Skin:  Negative for rash.  Neurological:  Negative for light-headedness and headaches.  Psychiatric/Behavioral:  Negative for agitation and behavioral problems.   All other systems reviewed and are negative.  Per HPI unless specifically indicated above   Allergies as of 08/08/2020       Reactions   Codeine Nausea And Vomiting   Sulfa Antibiotics    Hair loss, yellowed skin   Doxycycline Hyclate Itching        Medication List        Accurate as of August 08, 2020  9:55 AM. If you have any questions, ask your nurse or doctor.          acetaminophen 500 MG tablet Commonly known as: TYLENOL Take by mouth.   alendronate 70 MG tablet Commonly known as: FOSAMAX Take 1 tablet (70 mg total) by mouth every 7 (seven) days. Take with a full glass of water on an empty stomach.   amLODipine 5 MG tablet Commonly known as: NORVASC Take 1 tablet (5 mg total) by mouth daily.   cetirizine 10  BP (!) 144/81   Pulse 79   Ht 5' 1" (1.549 m)   Wt 147 lb (66.7 kg)   SpO2 97%   BMI 27.78 kg/m    Subjective:   Patient ID: Maria Moss, female    DOB: 06-12-1950, 70 y.o.   MRN: [id number]  HPI: Maria Moss is a 70 y.o. female presenting on 08/08/2020 for Medical Management of Chronic Issues, Hypertension, and Hyperlipidemia   HPI Hypertension Patient is currently on amlodipine and furosemide, and their blood pressure today is 144/81. Patient denies any lightheadedness or dizziness. Patient denies headaches, blurred vision, chest pains, shortness of breath, or weakness. Denies any side effects from medication and is content with current medication.   Hyperlipidemia Patient is coming in for recheck of his hyperlipidemia. The patient is currently taking fish oils. They deny any issues with myalgias or history of liver damage from it. They deny any focal numbness or weakness or chest pain.   Degenerative disc disease lumbarPain assessment: Cause of pain-degenerative disc disease causes pain in the lower back and down her right leg Pain location-  Pain on scale of 1-10- 6 Frequency-Daily What increases pain-certain movements, has been worsening more recently What makes pain Better-tramadol helps some but not as much as it used to, she had increased to start taking 3-day Effects on ADL -limits ability to walk and be on her feet for long periods Any change in general medical condition-none  Current opioids rx-tramadol 50 mg 3 times daily as needed # meds rx-90 Effectiveness of current meds-helps some Adverse reactions from pain meds-constipation Morphine equivalent-15  Pill count performed-No Last drug screen -01/31/2020 ( high risk q3m moderate risk q684mlow risk yearly ) Urine drug screen today- No Was the NC[place]pine[place]ed-yes  If yes were their any concerning findings? -None  No flowsheet data found.   Pain contract signed on: 01/31/2020   Relevant past medical, surgical,  family and social history reviewed and updated as indicated. Interim medical history since our last visit reviewed. Allergies and medications reviewed and updated.  Review of Systems  Constitutional:  Negative for chills and fever.  Eyes:  Negative for visual disturbance.  Respiratory:  Negative for chest tightness and shortness of breath.   Cardiovascular:  Negative for chest pain and leg swelling.  Gastrointestinal:  Negative for abdominal pain.  Musculoskeletal:  Positive for arthralgias, back pain, gait problem and myalgias.  Skin:  Negative for rash.  Neurological:  Negative for light-headedness and headaches.  Psychiatric/Behavioral:  Negative for agitation and behavioral problems.   All other systems reviewed and are negative.  Per HPI unless specifically indicated above   Allergies as of 08/08/2020       Reactions   Codeine Nausea And Vomiting   Sulfa Antibiotics    Hair loss, yellowed skin   Doxycycline Hyclate Itching        Medication List        Accurate as of August 08, 2020  9:55 AM. If you have any questions, ask your nurse or doctor.          acetaminophen 500 MG tablet Commonly known as: TYLENOL Take by mouth.   alendronate 70 MG tablet Commonly known as: FOSAMAX Take 1 tablet (70 mg total) by mouth every 7 (seven) days. Take with a full glass of water on an empty stomach.   amLODipine 5 MG tablet Commonly known as: NORVASC Take 1 tablet (5 mg total) by mouth daily.   cetirizine 10

## 2020-08-09 LAB — VITAMIN D 25 HYDROXY (VIT D DEFICIENCY, FRACTURES): Vit D, 25-Hydroxy: 50.4 ng/mL (ref 30.0–100.0)

## 2020-08-09 LAB — CMP14+EGFR
ALT: 20 IU/L (ref 0–32)
AST: 22 IU/L (ref 0–40)
Albumin/Globulin Ratio: 2.4 — ABNORMAL HIGH (ref 1.2–2.2)
Albumin: 4.7 g/dL (ref 3.8–4.8)
Alkaline Phosphatase: 181 IU/L — ABNORMAL HIGH (ref 44–121)
BUN/Creatinine Ratio: 20 (ref 12–28)
BUN: 20 mg/dL (ref 8–27)
Bilirubin Total: 0.5 mg/dL (ref 0.0–1.2)
CO2: 25 mmol/L (ref 20–29)
Calcium: 9.3 mg/dL (ref 8.7–10.3)
Chloride: 101 mmol/L (ref 96–106)
Creatinine, Ser: 1 mg/dL (ref 0.57–1.00)
Globulin, Total: 2 g/dL (ref 1.5–4.5)
Glucose: 91 mg/dL (ref 65–99)
Potassium: 4.5 mmol/L (ref 3.5–5.2)
Sodium: 142 mmol/L (ref 134–144)
Total Protein: 6.7 g/dL (ref 6.0–8.5)
eGFR: 61 mL/min/{1.73_m2} (ref >59)

## 2020-08-09 LAB — CBC WITH DIFFERENTIAL/PLATELET
Basophils Absolute: 0 10*3/uL (ref 0.0–0.2)
Basos: 0 %
EOS (ABSOLUTE): 0.4 10*3/uL (ref 0.0–0.4)
Eos: 4 %
Hematocrit: 39.8 % (ref 34.0–46.6)
Hemoglobin: 13.2 g/dL (ref 11.1–15.9)
Immature Grans (Abs): 0 10*3/uL (ref 0.0–0.1)
Immature Granulocytes: 0 %
Lymphocytes Absolute: 1.9 10*3/uL (ref 0.7–3.1)
Lymphs: 19 %
MCH: 29.6 pg (ref 26.6–33.0)
MCHC: 33.2 g/dL (ref 31.5–35.7)
MCV: 89 fL (ref 79–97)
Monocytes Absolute: 1 10*3/uL — ABNORMAL HIGH (ref 0.1–0.9)
Monocytes: 10 %
Neutrophils Absolute: 6.9 10*3/uL (ref 1.4–7.0)
Neutrophils: 67 %
Platelets: 569 10*3/uL — ABNORMAL HIGH (ref 150–450)
RBC: 4.46 x10E6/uL (ref 3.77–5.28)
RDW: 12.7 % (ref 11.7–15.4)
WBC: 10.3 10*3/uL (ref 3.4–10.8)

## 2020-08-09 LAB — T4, FREE: Free T4: 1.14 ng/dL (ref 0.82–1.77)

## 2020-08-09 LAB — LIPID PANEL
Chol/HDL Ratio: 3.4 ratio (ref 0.0–4.4)
Cholesterol, Total: 234 mg/dL — ABNORMAL HIGH (ref 100–199)
HDL: 68 mg/dL (ref >39)
LDL Chol Calc (NIH): 134 mg/dL — ABNORMAL HIGH (ref 0–99)
Triglycerides: 182 mg/dL — ABNORMAL HIGH (ref 0–149)
VLDL Cholesterol Cal: 32 mg/dL (ref 5–40)

## 2020-08-09 LAB — T3, FREE: T3, Free: 2.7 pg/mL (ref 2.0–4.4)

## 2020-08-09 LAB — TSH: TSH: 2.39 u[IU]/mL (ref 0.450–4.500)

## 2020-08-29 ENCOUNTER — Ambulatory Visit
Admission: RE | Admit: 2020-08-29 | Discharge: 2020-08-29 | Disposition: A | Discharge status: Home / Self Care | Payer: Medicare HMO | Source: Ambulatory Visit | Attending: Internal Medicine | Admitting: Internal Medicine

## 2020-08-29 DIAGNOSIS — Z85850 Personal history of malignant neoplasm of thyroid: Secondary | ICD-10-CM

## 2020-08-29 DIAGNOSIS — E041 Nontoxic single thyroid nodule: Secondary | ICD-10-CM | POA: Diagnosis not present

## 2020-08-29 DIAGNOSIS — E890 Postprocedural hypothyroidism: Secondary | ICD-10-CM

## 2020-09-01 ENCOUNTER — Other Ambulatory Visit: Payer: Self-pay | Admitting: Internal Medicine

## 2020-09-01 DIAGNOSIS — E041 Nontoxic single thyroid nodule: Secondary | ICD-10-CM

## 2020-09-01 NOTE — Progress Notes (Signed)
Patient is willing to do the biopsy.

## 2020-09-03 ENCOUNTER — Other Ambulatory Visit: Payer: Self-pay | Admitting: Internal Medicine

## 2020-09-09 ENCOUNTER — Telehealth: Payer: Self-pay

## 2020-09-09 ENCOUNTER — Ambulatory Visit (INDEPENDENT_AMBULATORY_CARE_PROVIDER_SITE_OTHER): Payer: Medicare HMO

## 2020-09-09 DIAGNOSIS — Z0000 Encounter for general adult medical examination without abnormal findings: Secondary | ICD-10-CM | POA: Diagnosis not present

## 2020-09-09 NOTE — Progress Notes (Addendum)
[facility] VISIT  09/09/2020  Telephone Visit Disclaimer This Medicare AWV was conducted by telephone due to national recommendations for restrictions regarding the COVID-19 Pandemic (e.g. social distancing).  I verified, using two identifiers, that I am speaking with Maria Moss or their authorized healthcare agent. I discussed the limitations, risks, security, and privacy concerns of performing an evaluation and management service by telephone and the potential availability of an in-person appointment in the future. The patient expressed understanding and agreed to proceed.  Location of Patient: Home Location of Provider (nurse):  [facility]  Subjective:    Maria Moss is a 70 y.o. female patient of Dettinger, Fransisca Kaufmann, MD who had a Medicare Annual Wellness Visit today via telephone. Maria Moss is Retired and lives with their spouse. she has two children. she reports that she is socially active and does interact with friends/family regularly. she is not physically active and enjoys reading.  Patient Care Team: Dettinger, Fransisca Kaufmann, MD as PCP - General (Family Medicine) Derek Jack, MD as Consulting Physician (Hematology)  Advanced Directives 09/09/2020 11/02/2019 10/26/2019 08/30/2019 05/30/2019 05/23/2019 02/19/2019  Does Patient Have a Medical Advance Directive? Yes Yes Yes Yes Yes Yes Yes  Type of Advance Directive Living will;Healthcare Power of [place];Living will [place];Living will [place];Living will [place];Living will [place];Living will Living will;Healthcare Power of Attorney  Does patient want to make changes to medical advance directive? No - Patient declined No - Patient declined No - Patient declined No - Patient declined No - Patient declined - No - Patient declined  Copy of [place] in Chart? - No - copy  requested No - copy requested No - copy requested No - copy requested No - copy requested -  Would patient like information on creating a medical advance directive? - No - Patient declined No - Patient declined - - - No - Patient declined    Hospital Utilization Over the Past 12 Months: # of hospitalizations or ER visits: 0 # of surgeries: 0  Review of Systems    Patient reports that her overall health is unchanged compared to last year.    Patient Reported Readings (BP, Pulse, CBG, Weight, etc) none  Pain Assessment Pain : 0-10 Pain Score: 7  Pain Type: Chronic pain Pain Location: Foot Pain Orientation: Right, Left Pain Radiating Towards: legs and back Pain Descriptors / Indicators: Constant Pain Onset: More than a month ago Pain Frequency: Constant Pain Relieving Factors: Tramadol some help  Pain Relieving Factors: Tramadol some help  Current Medications & Allergies (verified) Allergies as of 09/09/2020       Reactions   Codeine Nausea And Vomiting   Sulfa Antibiotics    Hair loss, yellowed skin   Doxycycline Hyclate Itching        Medication List        Accurate as of September 09, 2020 10:41 AM. If you have any questions, ask your nurse or doctor.          acetaminophen 500 MG tablet Commonly known as: TYLENOL Take by mouth.   alendronate 70 MG tablet Commonly known as: FOSAMAX Take 1 tablet (70 mg total) by mouth every 7 (seven) days. Take with a full glass of water on an empty stomach.   amLODipine 5 MG tablet Commonly known as: NORVASC Take 1 tablet (5 mg total) by mouth daily.  cetirizine 10 MG tablet Commonly known as: ZYRTEC Take 10 mg by mouth daily.   Cholecalciferol 25 MCG (1000 UT) tablet Take 3,000 Units by mouth daily.   dicyclomine 10 MG capsule Commonly known as: BENTYL Take 10 mg by mouth in the morning and at bedtime.   DULoxetine 60 MG capsule Commonly known as: CYMBALTA TAKE 1 CAPSULE EVERY DAY   famotidine 40 MG  tablet Commonly known as: PEPCID 40 mg 2 (two) times daily.   Fish Oil 1000 MG Caps Take 1,000 mg by mouth daily.   fluticasone 50 MCG/ACT nasal spray Commonly known as: FLONASE USE 2 SPRAYS IN EACH NOSTRIL EVERY DAY   furosemide 20 MG tablet Commonly known as: LASIX TAKE 1 TABLET EVERY DAY   HEMP OIL-VANILLYL BUTYL ETHER EX Apply topically.   hyoscyamine 0.125 MG Tbdp disintergrating tablet Commonly known as: ANASPAZ Take 1 tablet (0.125 mg total) by mouth every 6 (six) hours as needed.   IMODIUM PO Take by mouth as needed.   loratadine 10 MG tablet Commonly known as: CLARITIN Take 10 mg by mouth daily.   meloxicam 15 MG tablet Commonly known as: MOBIC TAKE 1 TABLET EVERY DAY   ondansetron 4 MG tablet Commonly known as: Zofran Take 1 tablet (4 mg total) by mouth every 8 (eight) hours as needed for nausea or vomiting.   pantoprazole 40 MG tablet Commonly known as: PROTONIX TAKE 1 TABLET (40 MG TOTAL) BY MOUTH DAILY.   PROBIOTIC & ACIDOPHILUS EX ST PO Take 1 capsule by mouth at bedtime.   traMADol 50 MG tablet Commonly known as: ULTRAM Take 1 tablet (50 mg total) by mouth every 8 (eight) hours as needed (mild pain). Start taking on: September 23, 2020        History (reviewed): Past Medical History:  Diagnosis Date   Anemia    iron deficiency   Ankle fracture, left 2006   Diverticula, intestine    Mild case.   Fibromyalgia    Dr. Justine Null Dx in [place] years ago  no meds    GERD (gastroesophageal reflux disease)    History of hiatal hernia    Hyperlipidemia    Hyperparathyroidism ([place])    Hypertension    Dx age 21.    Stroke [facility])    Questionable stoke when born or polio not sure pt. has right side weakness toes right foot turns outward and toes curled up some    Past Surgical History:  Procedure Laterality Date   ANKLE SURGERY Left    Broken.  Has screws and metal plate.   CHOLECYSTECTOMY     PARATHYROIDECTOMY Right 05/29/2019   Procedure:  RIGHT INFERIOR PARATHYROIDECTOMY;  Surgeon: Armandina Gemma, MD;  Location: [facility];  Service: General;  Laterality: Right;   ROBOTIC ADRENALECTOMY Right 05/31/2018   Procedure: XI ROBOTIC RIGHT ADRENALECTOMY;  Surgeon: Ralene Ok, MD;  Location: [facility];  Service: General;  Laterality: Right;   THYROID LOBECTOMY Right 05/29/2019   Procedure: RIGHT THYROID LOBECTOMY;  Surgeon: Armandina Gemma, MD;  Location: [facility];  Service: General;  Laterality: Right;   Family History  Problem Relation Age of Onset   Heart disease Mother        CHF   Cancer Mother        Uterine   Osteoporosis Mother    COPD Sister    Asthma Sister    Migraines Sister    Social History   Socioeconomic History   Marital status: Married    Spouse name:  Not on file   Number of children: Not on file   Years of education: Not on file   Highest education level: Not on file  Occupational History   Not on file  Tobacco Use   Smoking status: Never   Smokeless tobacco: Never  Vaping Use   Vaping Use: Never used  Substance and Sexual Activity   Alcohol use: No   Drug use: No   Sexual activity: Not Currently  Other Topics Concern   Not on file  Social History Narrative   Not on file   Social Determinants of Health   Financial Resource Strain: Not on file  Food Insecurity: Not on file  Transportation Needs: Not on file  Physical Activity: Not on file  Stress: Not on file  Social Connections: Not on file    Activities of Daily Living In your present state of health, do you have any difficulty performing the following activities: 09/09/2020 09/09/2020  Hearing? - N  Vision? - N  Difficulty concentrating or making decisions? - N  Walking or climbing stairs? Y N  Dressing or bathing? - N  Doing errands, shopping? - N  Conservation officer, nature and eating ? - N  Using the Toilet? - N  In the past six months, have you accidently leaked urine? - Y  Do you have problems with loss of bowel control? - Y  Managing your  Medications? - N  Managing your Finances? - N  Housekeeping or managing your Housekeeping? - N  Some recent data might be hidden    Patient Education/ Literacy How often do you need to have someone help you when you read instructions, pamphlets, or other written materials from your doctor or pharmacy?: 1 - Never What is the last grade level you completed in school?: HS  Exercise Current Exercise Habits: The patient does not participate in regular exercise at present  Diet Patient reports consuming 3 meals a day and 2 snack(s) a day Patient reports that her primary diet is: Regular Patient reports that she does have regular access to food.   Depression Screen PHQ 2/9 Scores 08/08/2020 04/30/2020 01/18/2020 10/18/2019 08/10/2019 07/18/2019 04/27/2019  PHQ - 2 Score 0 0 0 0 0 0 0  PHQ- 9 Score - - - - - - -     Fall Risk Fall Risk  09/09/2020 08/08/2020 04/30/2020 01/18/2020 10/18/2019  Falls in the past year? 0 0 0 0 0     Objective:  Maria Moss seemed alert and oriented and she participated appropriately during our telephone visit.  Blood Pressure Weight BMI  BP Readings from Last 3 Encounters:  08/08/20 (!) 144/81  07/25/20 120/72  04/30/20 (!) 149/79   Wt Readings from Last 3 Encounters:  08/08/20 147 lb (66.7 kg)  07/25/20 147 lb 12.8 oz (67 kg)  04/30/20 146 lb (66.2 kg)   BMI Readings from Last 1 Encounters:  08/08/20 27.78 kg/m    *Unable to obtain current vital signs, weight, and BMI due to telephone visit type  Hearing/Vision  Maria Moss did not seem to have difficulty with hearing/understanding during the telephone conversation Reports that she has not had a formal eye exam by an eye care professional within the past year Reports that she has not had a formal hearing evaluation within the past year *Unable to fully assess hearing and vision during telephone visit type  Cognitive Function: 6CIT Screen 09/09/2020  What Year? 0 points  What month? 0 points  What time? 0 points  Count back from 20 0 points  Months in reverse 0 points  Repeat phrase 0 points  Total Score 0   (Normal:0-7, Significant for Dysfunction: >8)  Normal Cognitive Function Screening: Yes   Immunization & Health Maintenance Record Immunization History  Administered Date(s) Administered   Fluad Quad(high Dose 65+) 10/13/2018   Influenza, High Dose Seasonal PF 10/21/2017   Influenza,inj,Quad PF,6+ Mos 11/03/2012, 10/24/2013, 10/18/2014, 11/19/2015, 11/11/2016   Influenza-Unspecified 10/20/2010, 12/15/2011   Moderna Sars-Covid-2 Vaccination 03/05/2019, 04/02/2019   PFIZER(Purple Top)SARS-COV-2 Vaccination 12/24/2019   Pneumococcal Conjugate-13 04/14/2017   Pneumococcal Polysaccharide-23 10/13/2018   Td 07/30/2013   Tdap 07/30/2013    Health Maintenance  Topic Date Due   INFLUENZA VACCINE  08/04/2020   COLONOSCOPY (Pts 45-17yr Insurance coverage will need to be confirmed)  10/16/2020 (Originally 07/05/2019)   Zoster Vaccines- Shingrix (1 of 2) 11/08/2020 (Originally [birth year]1971)   [place]  01/19/2021 (Originally 10/24/2019)   COVID-19 Vaccine (4 - Booster) 08/10/2021 (Originally 03/23/2020)   MAMMOGRAM  04/09/2021   DEXA SCAN  05/02/2022   TETANUS/TDAP  07/31/2023   Hepatitis C Screening  Completed   PNA vac Low Risk Adult  Completed   HPV VACCINES  Aged Out       Assessment  This is a routine wellness examination for [employer]  Health Maintenance: Due or Overdue Health Maintenance Due  Topic Date Due   INFLUENZA VACCINE  08/04/2020    [facility] does not need a referral for Community Assistance: Care Management:   no Social Work:    no Prescription Assistance:  no Nutrition/Diabetes Education:  no   Plan:  Personalized Goals  Goals Addressed             This Visit's Progress    Prevent falls   On track      Personalized Health Maintenance & Screening Recommendations  Influenza vaccine  Lung Cancer Screening Recommended:  no (Low Dose CT Chest recommended if Age 68-80years, 30 pack-year currently smoking OR have quit w/in past 15 years) Hepatitis C Screening recommended: no HIV Screening recommended: no  Advanced Directives: Written information was not prepared per patient's request.  Referrals & Orders No orders of the defined types were placed in this encounter.   Follow-up Plan Follow-up with Dettinger, JFransisca Kaufmann MD as planned Schedule 11/07/20    I have personally reviewed and noted the following in the patient's chart:   Medical and social history Use of alcohol, tobacco or illicit drugs  Current medications and supplements Functional ability and status Nutritional status Physical activity Advanced directives List of other physicians Hospitalizations, surgeries, and ER visits in previous 12 months Vitals Screenings to include cognitive, depression, and falls Referrals and appointments  In addition, I have reviewed and discussed with MGarvin [facility] certain preventive protocols, quality metrics, and best practice recommendations. A written personalized care plan for preventive services as well as general preventive health recommendations is available and can be mailed to the patient at her request.      AMaud DeedHill,LPN  [id number]   I have reviewed and agree with the above  documentation.   CEvelina Dun FNP

## 2020-09-09 NOTE — Telephone Encounter (Signed)
Patient c/o vaginal dryness. OTC medications are not helping. She does have painful intercourse because of this. States that she has no other vaginal concerns or pelvic pain.  She would would like a medication to help.  Patient is fine scheduling an appt if needed but would like to see a female provider.  Patient is ok to wait until tomorrow for Dettinger to address.

## 2020-09-09 NOTE — Patient Instructions (Signed)
  [place] Maintenance Summary and Written Plan of Care  Ms. Pasha ,  Thank you for allowing me to perform your Medicare Annual Wellness Visit and for your ongoing commitment to your health.   Health Maintenance & Immunization History Health Maintenance  Topic Date Due   INFLUENZA VACCINE  08/04/2020   COLONOSCOPY (Pts 45-56yr Insurance coverage will need to be confirmed)  10/16/2020 (Originally 07/05/2019)   Zoster Vaccines- Shingrix (1 of 2) 11/08/2020 (Originally 02/05/1969)   COLON CANCER SCREENING ANNUAL FOBT  01/19/2021 (Originally 10/24/2019)   COVID-19 Vaccine (4 - Booster) 08/10/2021 (Originally 03/23/2020)   MAMMOGRAM  04/09/2021   DEXA SCAN  05/02/2022   TETANUS/TDAP  07/31/2023   Hepatitis C Screening  Completed   PNA vac Low Risk Adult  Completed   HPV VACCINES  Aged Out   Immunization History  Administered Date(s) Administered   Fluad Quad(high Dose 65+) 10/13/2018   Influenza, High Dose Seasonal PF 10/21/2017   Influenza,inj,Quad PF,6+ Mos 11/03/2012, 10/24/2013, 10/18/2014, 11/19/2015, 11/11/2016   Influenza-Unspecified 10/20/2010, 12/15/2011   Moderna Sars-Covid-2 Vaccination 03/05/2019, 04/02/2019   PFIZER(Purple Top)SARS-COV-2 Vaccination 12/24/2019   Pneumococcal Conjugate-13 04/14/2017   Pneumococcal Polysaccharide-23 10/13/2018   Td 07/30/2013   Tdap 07/30/2013    These are the patient goals that we discussed:  Goals Addressed             This Visit's Progress    Prevent falls   On track        This is a list of Health Maintenance Items that are overdue or due now: Health Maintenance Due  Topic Date Due   INFLUENZA VACCINE  08/04/2020     Orders/Referrals Placed Today: No orders of the defined types were placed in this encounter.  (Contact our referral department at [phone] you have not spoken with someone about your referral appointment within the next 5 days)    Follow-up Plan  Scheduled with Dr.  DWarrick Parisian11/4/22 at 12:55 pm

## 2020-09-10 NOTE — Telephone Encounter (Signed)
Left message to call back  

## 2020-09-10 NOTE — Telephone Encounter (Signed)
How long has it been since she had a pelvic exam, its been a while then she probably needs to schedule an appointment to come in for a pelvic exam so we can take a look at things and see what we can help with.

## 2020-09-17 ENCOUNTER — Other Ambulatory Visit (HOSPITAL_COMMUNITY)
Admission: RE | Admit: 2020-09-17 | Discharge: 2020-09-17 | Disposition: A | Discharge status: Home / Self Care | Payer: Medicare HMO | Source: Ambulatory Visit | Attending: Internal Medicine | Admitting: Internal Medicine

## 2020-09-17 ENCOUNTER — Ambulatory Visit
Admission: RE | Admit: 2020-09-17 | Discharge: 2020-09-17 | Disposition: A | Discharge status: Home / Self Care | Payer: Medicare HMO | Source: Ambulatory Visit | Attending: Internal Medicine | Admitting: Internal Medicine

## 2020-09-17 DIAGNOSIS — D440 Neoplasm of uncertain behavior of thyroid gland: Secondary | ICD-10-CM | POA: Diagnosis not present

## 2020-09-17 DIAGNOSIS — E041 Nontoxic single thyroid nodule: Secondary | ICD-10-CM

## 2020-09-18 LAB — CYTOLOGY - NON PAP

## 2020-09-18 NOTE — Telephone Encounter (Signed)
Attempts to contact pt without a return call in over 3 days, will close encounter. °

## 2020-09-23 DIAGNOSIS — M25572 Pain in left ankle and joints of left foot: Secondary | ICD-10-CM | POA: Diagnosis not present

## 2020-09-23 DIAGNOSIS — M79672 Pain in left foot: Secondary | ICD-10-CM | POA: Diagnosis not present

## 2020-10-07 DIAGNOSIS — M79672 Pain in left foot: Secondary | ICD-10-CM | POA: Diagnosis not present

## 2020-10-07 DIAGNOSIS — M25572 Pain in left ankle and joints of left foot: Secondary | ICD-10-CM | POA: Diagnosis not present

## 2020-11-04 DIAGNOSIS — M25572 Pain in left ankle and joints of left foot: Secondary | ICD-10-CM | POA: Diagnosis not present

## 2020-11-04 DIAGNOSIS — M79672 Pain in left foot: Secondary | ICD-10-CM | POA: Diagnosis not present

## 2020-11-05 ENCOUNTER — Telehealth: Payer: Self-pay | Admitting: Family Medicine

## 2020-11-05 NOTE — Telephone Encounter (Signed)
Pt had labs last Aug. Cholesterol was elevated. Dettinger suggested statin but pt has not started it. Pt will not come for labs prior to appt.

## 2020-11-07 ENCOUNTER — Ambulatory Visit (INDEPENDENT_AMBULATORY_CARE_PROVIDER_SITE_OTHER): Payer: Medicare HMO | Admitting: Family Medicine

## 2020-11-07 ENCOUNTER — Other Ambulatory Visit: Payer: Self-pay

## 2020-11-07 ENCOUNTER — Encounter: Payer: Self-pay | Admitting: Family Medicine

## 2020-11-07 VITALS — BP 142/76 | HR 80 | Temp 98.2°F | Ht 61.0 in | Wt 153.0 lb

## 2020-11-07 DIAGNOSIS — R52 Pain, unspecified: Secondary | ICD-10-CM

## 2020-11-07 DIAGNOSIS — F411 Generalized anxiety disorder: Secondary | ICD-10-CM | POA: Diagnosis not present

## 2020-11-07 DIAGNOSIS — M5136 Other intervertebral disc degeneration, lumbar region: Secondary | ICD-10-CM | POA: Diagnosis not present

## 2020-11-07 DIAGNOSIS — E782 Mixed hyperlipidemia: Secondary | ICD-10-CM | POA: Diagnosis not present

## 2020-11-07 DIAGNOSIS — R5383 Other fatigue: Secondary | ICD-10-CM | POA: Diagnosis not present

## 2020-11-07 DIAGNOSIS — Z23 Encounter for immunization: Secondary | ICD-10-CM | POA: Diagnosis not present

## 2020-11-07 DIAGNOSIS — J029 Acute pharyngitis, unspecified: Secondary | ICD-10-CM

## 2020-11-07 DIAGNOSIS — I10 Essential (primary) hypertension: Secondary | ICD-10-CM | POA: Diagnosis not present

## 2020-11-07 LAB — VERITOR FLU A/B WAIVED
Influenza A: NEGATIVE
Influenza B: NEGATIVE

## 2020-11-07 MED ORDER — TRAMADOL HCL 50 MG PO TABS: 50.0000 mg | ORAL_TABLET | Freq: Three times a day (TID) | ORAL | 1 refills | Status: DC | PRN

## 2020-11-07 NOTE — Progress Notes (Signed)
BP (!) 142/76   Pulse 80   Temp 98.2 F (36.8 C)   Ht 5\' 1"  (1.549 m)   Wt 153 lb (69.4 kg)   SpO2 100%   BMI 28.91 kg/m    Subjective:   Patient ID: Maria Moss, female    DOB: 01/22/1950, 70 y.o.   MRN: [id number]  HPI: Maria Moss is a 70 y.o. female presenting on 11/07/2020 for Medical Management of Chronic Issues, Hyperlipidemia, Hypertension, and Anxiety   HPI Hypertension Patient is currently on amlodipine, and their blood pressure today is 142/76. Patient denies any lightheadedness or dizziness. Patient denies headaches, blurred vision, chest pains, shortness of breath, or weakness. Denies any side effects from medication and is content with current medication.   Hyperlipidemia Patient is coming in for recheck of his hyperlipidemia. The patient is currently taking fish oil. They deny any issues with myalgias or history of liver damage from it. They deny any focal numbness or weakness or chest pain.   Pain assessment: Cause of pain-arthritis, worse in her spine and back Pain location-lumbar and down her legs Pain on scale of 1-10- 6 Frequency-Daily What increases pain-certain movements or being on her feet or weather changes What makes pain Better-resting or tramadol Effects on ADL -limits some on her ability to walk Any change in general medical condition-none  Current opioids rx-tramadol 50 mg 3 times daily as needed # meds rx-90 Effectiveness of current meds-works okay, not completely all the time but does help Adverse reactions from pain meds-none Morphine equivalent-15  Pill count performed-No Last drug screen -01/31/2020 ( high risk q52m, moderate risk q13m, low risk yearly ) Urine drug screen today- No Was the [facility] reviewed-yes  If yes were their any concerning findings? -She did have some oxycodone at 1 point but does not currently.  Pain contract signed on: 01/31/2020  Anxiety recheck Patient is currently taking Cymbalta for anxiety and help with pain and  feels like is doing well for her.  She denies any suicidal ideations or thoughts of hurting self. Depression screen [facility] 2/9 11/07/2020 08/08/2020 04/30/2020 01/18/2020 10/18/2019  Decreased Interest 0 0 0 0 0  Down, Depressed, Hopeless 0 0 0 0 0  PHQ - 2 Score 0 0 0 0 0  Altered sleeping - 3 - - -  Tired, decreased energy - 3 - - -  Change in appetite - - - - -  Feeling bad or failure about yourself  - - - - -  Trouble concentrating - - - - -  Moving slowly or fidgety/restless - - - - -  Suicidal thoughts - - - - -  PHQ-9 Score - - - - -  Some recent data might be hidden      Relevant past medical, surgical, family and social history reviewed and updated as indicated. Interim medical history since our last visit reviewed. Allergies and medications reviewed and updated.  Review of Systems  Constitutional:  Negative for chills and fever.  HENT:  Positive for congestion. Negative for ear discharge and ear pain.   Eyes:  Negative for redness and visual disturbance.  Respiratory:  Negative for chest tightness and shortness of breath.   Cardiovascular:  Negative for chest pain and leg swelling.  Genitourinary:  Negative for difficulty urinating and dysuria.  Musculoskeletal:  Positive for arthralgias, back pain, gait problem and myalgias.  Skin:  Negative for rash.  Neurological:  Negative for light-headedness and headaches.  Psychiatric/Behavioral:  Negative for agitation  and behavioral problems.   All other systems reviewed and are negative.  Per HPI unless specifically indicated above   Allergies as of 11/07/2020       Reactions   Codeine Nausea And Vomiting   Sulfa Antibiotics    Hair loss, yellowed skin   Doxycycline Hyclate Itching        Medication List        Accurate as of November 07, 2020  1:14 PM. If you have any questions, ask your nurse or doctor.          acetaminophen 500 MG tablet Commonly known as: TYLENOL Take by mouth.   alendronate 70 MG  tablet Commonly known as: FOSAMAX Take 1 tablet (70 mg total) by mouth every 7 (seven) days. Take with a full glass of water on an empty stomach.   amLODipine 5 MG tablet Commonly known as: NORVASC Take 1 tablet (5 mg total) by mouth daily.   cetirizine 10 MG tablet Commonly known as: ZYRTEC Take 10 mg by mouth daily.   Cholecalciferol 25 MCG (1000 UT) tablet Take 3,000 Units by mouth daily.   dicyclomine 10 MG capsule Commonly known as: BENTYL Take 10 mg by mouth in the morning and at bedtime.   DULoxetine 60 MG capsule Commonly known as: CYMBALTA TAKE 1 CAPSULE EVERY DAY   famotidine 40 MG tablet Commonly known as: PEPCID 40 mg 2 (two) times daily.   Fish Oil 1000 MG Caps Take 1,000 mg by mouth daily.   fluticasone 50 MCG/ACT nasal spray Commonly known as: FLONASE USE 2 SPRAYS IN EACH NOSTRIL EVERY DAY   furosemide 20 MG tablet Commonly known as: LASIX TAKE 1 TABLET EVERY DAY   HEMP OIL-VANILLYL BUTYL ETHER EX Apply topically.   hyoscyamine 0.125 MG Tbdp disintergrating tablet Commonly known as: ANASPAZ Take 1 tablet (0.125 mg total) by mouth every 6 (six) hours as needed.   IMODIUM PO Take by mouth as needed.   loratadine 10 MG tablet Commonly known as: CLARITIN Take 10 mg by mouth daily.   meloxicam 15 MG tablet Commonly known as: MOBIC TAKE 1 TABLET EVERY DAY   ondansetron 4 MG tablet Commonly known as: Zofran Take 1 tablet (4 mg total) by mouth every 8 (eight) hours as needed for nausea or vomiting.   pantoprazole 40 MG tablet Commonly known as: PROTONIX TAKE 1 TABLET (40 MG TOTAL) BY MOUTH DAILY.   PROBIOTIC & ACIDOPHILUS EX ST PO Take 1 capsule by mouth at bedtime.   traMADol 50 MG tablet Commonly known as: ULTRAM Take 1 tablet (50 mg total) by mouth every 8 (eight) hours as needed (mild pain).         Objective:   BP (!) 161/81   Pulse 80   Temp 98.2 F (36.8 C)   Ht 5\' 1"  (1.549 m)   Wt 153 lb (69.4 kg)   SpO2 100%   BMI  28.91 kg/m   Wt Readings from Last 3 Encounters:  11/07/20 153 lb (69.4 kg)  08/08/20 147 lb (66.7 kg)  07/25/20 147 lb 12.8 oz (67 kg)    Physical Exam Vitals and nursing note reviewed.  Constitutional:      General: She is not in acute distress.    Appearance: She is well-developed. She is not diaphoretic.  Eyes:     Conjunctiva/sclera: Conjunctivae normal.  Cardiovascular:     Rate and Rhythm: Normal rate and regular rhythm.     Heart sounds: Normal heart sounds. No murmur  heard. Pulmonary:     Effort: Pulmonary effort is normal. No respiratory distress.     Breath sounds: Normal breath sounds. No wheezing.  Musculoskeletal:        General: No tenderness. Normal range of motion.  Skin:    General: Skin is warm and dry.     Findings: No rash.  Neurological:     Mental Status: She is alert and oriented to person, place, and time.     Coordination: Coordination normal.  Psychiatric:        Behavior: Behavior normal.    Flu negative  Assessment & Plan:   Problem List Items Addressed This Visit       Cardiovascular and Mediastinum   Hypertension (Chronic)     Musculoskeletal and Integument   Degenerative disc disease, lumbar (Chronic)     Other   Hyperlipemia (Chronic)   GAD (generalized anxiety disorder) (Chronic)   Other Visit Diagnoses     Other fatigue    -  Primary   Relevant Orders   Veritor Flu A/B Waived   Sore throat       Relevant Orders   Veritor Flu A/B Waived   Body aches       Relevant Orders   Veritor Flu A/B Waived       Patient was recently exposed to the flu some and has body aches, she cannot tell if it is more less than her normal body aches, will treat conservatively because flu was negative. Follow up plan: No follow-ups on file.  Counseling provided for all of the vaccine components Orders Placed This Encounter  Procedures   Veritor Flu A/B Waived    Arville Care, MD [facility] 11/07/2020, 1:14  PM

## 2020-11-14 ENCOUNTER — Ambulatory Visit (INDEPENDENT_AMBULATORY_CARE_PROVIDER_SITE_OTHER): Payer: Medicare HMO | Admitting: Nurse Practitioner

## 2020-11-14 ENCOUNTER — Encounter: Payer: Self-pay | Admitting: Nurse Practitioner

## 2020-11-14 DIAGNOSIS — J0110 Acute frontal sinusitis, unspecified: Secondary | ICD-10-CM | POA: Insufficient documentation

## 2020-11-14 DIAGNOSIS — Z91199 Patient's noncompliance with other medical treatment and regimen due to unspecified reason: Secondary | ICD-10-CM | POA: Insufficient documentation

## 2020-11-14 MED ORDER — AMOXICILLIN-POT CLAVULANATE 875-125 MG PO TABS: 1.0000 | ORAL_TABLET | Freq: Two times a day (BID) | ORAL | 0 refills | Status: DC

## 2020-11-14 NOTE — Assessment & Plan Note (Signed)
Take meds as prescribed - Use a cool mist humidifier  -Use saline nose sprays frequently -Force fluids -For fever or aches or pains- take Tylenol or ibuprofen. -Augmentin 875-125 mg tablet by mouth -If symptoms do not improve, she may need to be COVID tested to rule this out Follow up with worsening unresolved symptoms

## 2020-11-14 NOTE — Progress Notes (Signed)
Patient did not answer phone call on time and 20 minutes after visit time

## 2020-11-14 NOTE — Progress Notes (Signed)
   Virtual Visit  Note Due to COVID-19 pandemic this visit was conducted virtually. This visit type was conducted due to national recommendations for restrictions regarding the COVID-19 Pandemic (e.g. social distancing, sheltering in place) in an effort to limit this patient's exposure and mitigate transmission in our community. All issues noted in this document were discussed and addressed.  A physical exam was not performed with this format.  I connected with Maria Moss on 11/14/20 at 11:40 am  by telephone and verified that I am speaking with the correct person using two identifiers. Maria Moss is currently located at home during visit. The provider, Ivy Lynn, NP is located in their office at time of visit.  I discussed the limitations, risks, security and privacy concerns of performing an evaluation and management service by telephone and the availability of in person appointments. I also discussed with the patient that there may be a patient responsible charge related to this service. The patient expressed understanding and agreed to proceed.   History and Present Illness:  Sinusitis The current episode started in the past 7 days. The problem is unchanged. There has been no fever. The pain is moderate. Associated symptoms include chills, congestion, coughing and a sore throat. Pertinent negatives include no ear pain. Past treatments include oral decongestants and acetaminophen. The treatment provided mild relief.     Review of Systems  Constitutional:  Positive for chills. Negative for fever.  HENT:  Positive for congestion, sinus pain and sore throat. Negative for ear pain.   Respiratory:  Positive for cough.   Skin:  Negative for rash.  All other systems reviewed and are negative.   Observations/Objective:  Televisit patient not in distress. Assessment and Plan: Take meds as prescribed - Use a cool mist humidifier  -Use saline nose sprays frequently -Force fluids -For  fever or aches or pains- take Tylenol or ibuprofen. -Augmentin 875-125 mg tablet by mouth -If symptoms do not improve, she may need to be COVID tested to rule this out Follow up with worsening unresolved symptoms   Follow Up Instructions: Follow-up with worsening or unresolved symptoms.    I discussed the assessment and treatment plan with the patient. The patient was provided an opportunity to ask questions and all were answered. The patient agreed with the plan and demonstrated an understanding of the instructions.   The patient was advised to call back or seek an in-person evaluation if the symptoms worsen or if the condition fails to improve as anticipated.  The above assessment and management plan was discussed with the patient. The patient verbalized understanding of and has agreed to the management plan. Patient is aware to call the clinic if symptoms persist or worsen. Patient is aware when to return to the clinic for a follow-up visit. Patient educated on when it is appropriate to go to the emergency department.   Time call ended:  11:50 am   I provided 10 minutes of  non face-to-face time during this encounter.    Ivy Lynn, NP

## 2020-11-18 ENCOUNTER — Telehealth: Payer: Self-pay | Admitting: Family Medicine

## 2020-11-18 NOTE — Telephone Encounter (Signed)
Pt is usually seen every 3 months and this time she was told to come back in 6 months but she wanted to be sure that her traMADol (ULTRAM) 50 MG tablet would still be called in for her to have it until her next appt. Asked for a message to be put in to know for sure. Please call back.

## 2020-11-18 NOTE — Telephone Encounter (Signed)
Pt was seen 11/4. #90 with 1 refill was given then. Pt states that she takes 3 tabs per day. Can Dr. Warrick Parisian increase the quantity so pt will make it to follow up that is scheduled for 4/28. Pt aware that this would not be addressed until tomorrow when Dettinger is back in clinic.

## 2020-11-19 ENCOUNTER — Other Ambulatory Visit: Payer: Self-pay | Admitting: Family Medicine

## 2020-11-19 DIAGNOSIS — K219 Gastro-esophageal reflux disease without esophagitis: Secondary | ICD-10-CM

## 2020-11-19 NOTE — Telephone Encounter (Signed)
This would be a change from the way that she was taking it previously, she was spaced out to 6 months because to prescriptions were lasting her 6 months before.  If she is using more frequently than please have her schedule back in 2 months and we can discuss that then.  She said she was having body aches because of a flulike illness but I did not assume that that would continue so yes it would be something that we need to discuss in person if it has continued

## 2020-11-19 NOTE — Telephone Encounter (Signed)
Pt states that her prescription has always been for three times per day. She has not always taken it that way. Just when she needs to. Made an appt for 61m to discuss.

## 2020-11-24 ENCOUNTER — Other Ambulatory Visit: Payer: Self-pay | Admitting: Nurse Practitioner

## 2020-11-24 ENCOUNTER — Telehealth: Payer: Self-pay | Admitting: Family Medicine

## 2020-11-24 MED ORDER — FLUCONAZOLE 150 MG PO TABS: 150.0000 mg | ORAL_TABLET | Freq: Once | ORAL | 0 refills | Status: AC

## 2020-11-24 NOTE — Telephone Encounter (Signed)
  Incoming Patient Call  11/24/2020  What symptoms do you have? Burning and itching. Had televisit with Je 11-11 and ABX gave her yeast infection  How long have you been sick? Last Thursday  Have you been seen for this problem? NO  If your provider decides to give you a prescription, which pharmacy would you like for it to be sent to? [facility].   Patient informed that this information will be sent to the clinical staff for review and that they should receive a follow up call.

## 2020-11-24 NOTE — Telephone Encounter (Signed)
Patient aware.

## 2020-12-01 ENCOUNTER — Other Ambulatory Visit (HOSPITAL_COMMUNITY): Payer: Self-pay | Admitting: Physician Assistant

## 2020-12-01 ENCOUNTER — Other Ambulatory Visit (HOSPITAL_COMMUNITY): Payer: Self-pay | Admitting: *Deleted

## 2020-12-01 ENCOUNTER — Other Ambulatory Visit: Payer: Medicare HMO

## 2020-12-01 DIAGNOSIS — D638 Anemia in other chronic diseases classified elsewhere: Secondary | ICD-10-CM

## 2020-12-01 DIAGNOSIS — R6889 Other general symptoms and signs: Secondary | ICD-10-CM | POA: Diagnosis not present

## 2020-12-01 DIAGNOSIS — D509 Iron deficiency anemia, unspecified: Secondary | ICD-10-CM | POA: Diagnosis not present

## 2020-12-02 LAB — COMPREHENSIVE METABOLIC PANEL
ALT: 24 IU/L (ref 0–32)
AST: 24 IU/L (ref 0–40)
Albumin/Globulin Ratio: 2 (ref 1.2–2.2)
Albumin: 4.6 g/dL (ref 3.8–4.8)
Alkaline Phosphatase: 145 IU/L — ABNORMAL HIGH (ref 44–121)
BUN/Creatinine Ratio: 18 (ref 12–28)
BUN: 21 mg/dL (ref 8–27)
Bilirubin Total: 0.4 mg/dL (ref 0.0–1.2)
CO2: 24 mmol/L (ref 20–29)
Calcium: 9.6 mg/dL (ref 8.7–10.3)
Chloride: 99 mmol/L (ref 96–106)
Creatinine, Ser: 1.17 mg/dL — ABNORMAL HIGH (ref 0.57–1.00)
Globulin, Total: 2.3 g/dL (ref 1.5–4.5)
Glucose: 105 mg/dL — ABNORMAL HIGH (ref 70–99)
Potassium: 5.3 mmol/L — ABNORMAL HIGH (ref 3.5–5.2)
Sodium: 138 mmol/L (ref 134–144)
Total Protein: 6.9 g/dL (ref 6.0–8.5)
eGFR: 51 mL/min/{1.73_m2} — ABNORMAL LOW (ref >59)

## 2020-12-02 LAB — CBC WITH DIFFERENTIAL/PLATELET
Basophils Absolute: 0 10*3/uL (ref 0.0–0.2)
Basos: 0 %
EOS (ABSOLUTE): 0.5 10*3/uL — ABNORMAL HIGH (ref 0.0–0.4)
Eos: 5 %
Hematocrit: 40.2 % (ref 34.0–46.6)
Hemoglobin: 13.5 g/dL (ref 11.1–15.9)
Immature Grans (Abs): 0 10*3/uL (ref 0.0–0.1)
Immature Granulocytes: 0 %
Lymphocytes Absolute: 2.5 10*3/uL (ref 0.7–3.1)
Lymphs: 27 %
MCH: 29.4 pg (ref 26.6–33.0)
MCHC: 33.6 g/dL (ref 31.5–35.7)
MCV: 88 fL (ref 79–97)
Monocytes Absolute: 0.8 10*3/uL (ref 0.1–0.9)
Monocytes: 9 %
Neutrophils Absolute: 5.6 10*3/uL (ref 1.4–7.0)
Neutrophils: 59 %
Platelets: 611 10*3/uL — ABNORMAL HIGH (ref 150–450)
RBC: 4.59 x10E6/uL (ref 3.77–5.28)
RDW: 12.4 % (ref 11.7–15.4)
WBC: 9.5 10*3/uL (ref 3.4–10.8)

## 2020-12-02 LAB — IRON AND TIBC
Iron Saturation: 24 % (ref 15–55)
Iron: 63 ug/dL (ref 27–139)
Total Iron Binding Capacity: 260 ug/dL (ref 250–450)
UIBC: 197 ug/dL (ref 118–369)

## 2020-12-02 LAB — FERRITIN: Ferritin: 511 ng/mL — ABNORMAL HIGH (ref 15–150)

## 2020-12-02 LAB — VITAMIN B12: Vitamin B-12: 673 pg/mL (ref 232–1245)

## 2020-12-03 NOTE — Progress Notes (Signed)
Maria Moss, [place] [id number]   CLINIC:  Medical Oncology/Hematology  PCP:  Dettinger, Fransisca Kaufmann, MD [place] [address] [phone]   REASON FOR VISIT:  Follow-up for normocytic anemia and thrombocytosis  CURRENT THERAPY: Intermittent iron infusions (last Feraheme on 10/26/2019 and 11/02/2019)  INTERVAL HISTORY:  Maria Moss 70 y.o. female returns for routine follow-up of her normocytic anemia and thrombocytosis.  She was last seen by NP Francene Finders on 08/30/2019.  At today's visit, she reports feeling fair apart from fatigue.  No recent hospitalizations, surgeries, or changes in baseline health status.  She has chronic fatigue, but reports that her fatigue has been progressively worsening over the past month.  She also reports dyspnea on exertion.  No chest pain, lightheadedness, or syncopal episodes.  No obvious bleeding such as epistaxis, hematemesis, hematochezia, or melena.  Regarding her thrombocytosis, she denies any definitive history of autoimmune or inflammatory diseases, but was previously told that she "might have lupus or fibromyalgia."  She has no known history of DVT or PE.  No aquagenic pruritus, Raynaud's phenomenon, or vasomotor symptoms.  She denies any B symptoms such as fever, chills, night sweats, unintentional weight loss.  She has 25% energy and 100% appetite. She endorses that she is maintaining a stable weight.    REVIEW OF SYSTEMS:  Review of Systems  Constitutional:  Positive for fatigue. Negative for appetite change, chills, diaphoresis, fever and unexpected weight change.  HENT:   Negative for lump/mass and nosebleeds.   Eyes:  Negative for eye problems.  Respiratory:  Positive for shortness of breath (With exertion). Negative for cough and hemoptysis.   Cardiovascular:  Positive for leg swelling. Negative for chest pain and palpitations.  Gastrointestinal:  Positive for constipation. Negative for  abdominal pain, blood in stool, diarrhea, nausea and vomiting.  Genitourinary:  Negative for hematuria.   Musculoskeletal:  Positive for back pain.  Skin: Negative.   Neurological:  Negative for dizziness, headaches and light-headedness.  Hematological:  Does not bruise/bleed easily.  Psychiatric/Behavioral:  Positive for sleep disturbance.      PAST MEDICAL/SURGICAL HISTORY:  Past Medical History:  Diagnosis Date   Anemia    iron deficiency   Ankle fracture, left 2006   Diverticula, intestine    Mild case.   Fibromyalgia    Dr. Justine Null Dx in [place] years ago  no meds    GERD (gastroesophageal reflux disease)    History of hiatal hernia    Hyperlipidemia    Hyperparathyroidism ([place])    Hypertension    Dx age 48.    Stroke [facility])    Questionable stoke when born or polio not sure pt. has right side weakness toes right foot turns outward and toes curled up some    Past Surgical History:  Procedure Laterality Date   ANKLE SURGERY Left    Broken.  Has screws and metal plate.   CHOLECYSTECTOMY     PARATHYROIDECTOMY Right 05/29/2019   Procedure: RIGHT INFERIOR PARATHYROIDECTOMY;  Surgeon: Armandina Gemma, MD;  Location: [facility];  Service: General;  Laterality: Right;   ROBOTIC ADRENALECTOMY Right 05/31/2018   Procedure: XI ROBOTIC RIGHT ADRENALECTOMY;  Surgeon: Ralene Ok, MD;  Location: [facility];  Service: General;  Laterality: Right;   THYROID LOBECTOMY Right 05/29/2019   Procedure: RIGHT THYROID LOBECTOMY;  Surgeon: Armandina Gemma, MD;  Location: [facility];  Service: General;  Laterality: Right;     SOCIAL HISTORY:  Social History  Socioeconomic History   Marital status: Married    Spouse name: Not on file   Number of children: Not on file   Years of education: Not on file   Highest education level: Not on file  Occupational History   Not on file  Tobacco Use   Smoking status: Never   Smokeless tobacco: Never  Vaping Use   Vaping Use: Never used  Substance and Sexual  Activity   Alcohol use: No   Drug use: No   Sexual activity: Not Currently  Other Topics Concern   Not on file  Social History Narrative   Not on file   Social Determinants of Health   Financial Resource Strain: Not on file  Food Insecurity: Not on file  Transportation Needs: Not on file  Physical Activity: Not on file  Stress: Not on file  Social Connections: Not on file  Intimate Partner Violence: Not on file    FAMILY HISTORY:  Family History  Problem Relation Age of Onset   Heart disease Mother        CHF   Cancer Mother        Uterine   Osteoporosis Mother    COPD Sister    Asthma Sister    Migraines Sister     CURRENT MEDICATIONS:  Outpatient Encounter Medications as of 12/04/2020  Medication Sig Note   acetaminophen (TYLENOL) 500 MG tablet Take by mouth.    alendronate (FOSAMAX) 70 MG tablet Take 1 tablet (70 mg total) by mouth every 7 (seven) days. Take with a full glass of water on an empty stomach.    amLODipine (NORVASC) 5 MG tablet Take 1 tablet (5 mg total) by mouth daily.    amoxicillin-clavulanate (AUGMENTIN) 875-125 MG tablet Take 1 tablet by mouth 2 (two) times daily.    cetirizine (ZYRTEC) 10 MG tablet Take 10 mg by mouth daily.    Cholecalciferol 25 MCG (1000 UT) tablet Take 3,000 Units by mouth daily.     dicyclomine (BENTYL) 10 MG capsule Take 10 mg by mouth in the morning and at bedtime.    DULoxetine (CYMBALTA) 60 MG capsule TAKE 1 CAPSULE EVERY DAY    famotidine (PEPCID) 40 MG tablet 40 mg 2 (two) times daily.    fluticasone (FLONASE) 50 MCG/ACT nasal spray USE 2 SPRAYS IN EACH NOSTRIL EVERY DAY    furosemide (LASIX) 20 MG tablet TAKE 1 TABLET EVERY DAY    HEMP OIL-VANILLYL BUTYL ETHER EX Apply topically.    hyoscyamine (ANASPAZ) 0.125 MG TBDP disintergrating tablet Take 1 tablet (0.125 mg total) by mouth every 6 (six) hours as needed.    Loperamide HCl (IMODIUM PO) Take by mouth as needed.    loratadine (CLARITIN) 10 MG tablet Take 10 mg by  mouth daily. 04/16/2019: Alternates between zyrtec and claritin   meloxicam (MOBIC) 15 MG tablet TAKE 1 TABLET EVERY DAY    Omega-3 Fatty Acids (FISH OIL) 1000 MG CAPS Take 1,000 mg by mouth daily.     ondansetron (ZOFRAN) 4 MG tablet Take 1 tablet (4 mg total) by mouth every 8 (eight) hours as needed for nausea or vomiting.    pantoprazole (PROTONIX) 40 MG tablet TAKE 1 TABLET (40 MG TOTAL) BY MOUTH DAILY.    Probiotic Product (PROBIOTIC & ACIDOPHILUS EX ST PO) Take 1 capsule by mouth at bedtime.     traMADol (ULTRAM) 50 MG tablet Take 1 tablet (50 mg total) by mouth every 8 (eight) hours as needed (mild pain).  No facility-administered encounter medications on file as of 12/04/2020.    ALLERGIES:  Allergies  Allergen Reactions   Codeine Nausea And Vomiting   Sulfa Antibiotics     Hair loss, yellowed skin   Doxycycline Hyclate Itching     PHYSICAL EXAM:  ECOG PERFORMANCE STATUS: 2 - Symptomatic, <50% confined to bed  There were no vitals filed for this visit. There were no vitals filed for this visit. Physical Exam Constitutional:      Appearance: Normal appearance. She is obese.  HENT:     Head: Normocephalic and atraumatic.     Mouth/Throat:     Mouth: Mucous membranes are moist.  Eyes:     Extraocular Movements: Extraocular movements intact.     Pupils: Pupils are equal, round, and reactive to light.  Cardiovascular:     Rate and Rhythm: Normal rate and regular rhythm.     Pulses: Normal pulses.     Heart sounds: Normal heart sounds.  Pulmonary:     Effort: Pulmonary effort is normal.     Breath sounds: Normal breath sounds.  Abdominal:     General: Bowel sounds are normal.     Palpations: Abdomen is soft.     Tenderness: There is no abdominal tenderness.  Musculoskeletal:        General: No swelling.     Right lower leg: No edema.     Left lower leg: No edema.  Lymphadenopathy:     Cervical: No cervical adenopathy.  Skin:    General: Skin is warm and dry.   Neurological:     General: No focal deficit present.     Mental Status: She is alert and oriented to person, place, and time.  Psychiatric:        Mood and Affect: Mood normal.        Behavior: Behavior normal.     LABORATORY DATA:  I have reviewed the labs as listed.  CBC    Component Value Date/Time   WBC 9.5 12/01/2020 1536   WBC 10.9 (H) 05/25/2019 1428   RBC 4.59 12/01/2020 1536   RBC 4.28 05/25/2019 1428   HGB 13.5 12/01/2020 1536   HCT 40.2 12/01/2020 1536   PLT 611 (H) 12/01/2020 1536   MCV 88 12/01/2020 1536   MCH 29.4 12/01/2020 1536   MCH 29.0 05/25/2019 1428   MCHC 33.6 12/01/2020 1536   MCHC 31.6 05/25/2019 1428   RDW 12.4 12/01/2020 1536   LYMPHSABS 2.5 12/01/2020 1536   EOSABS 0.5 (H) 12/01/2020 1536   BASOSABS 0.0 12/01/2020 1536   CMP Latest Ref Rng & Units 12/01/2020 08/08/2020 01/18/2020  Glucose 70 - 99 mg/dL 105(H) 91 89  BUN 8 - 27 mg/dL _0 Creatinine 0.57 - 1.00 mg/dL 1.17(H) 1.00 1.17(H)  Sodium 134 - 144 mmol/L 138 142 141  Potassium 3.5 - 5.2 mmol/L 5.3(H) 4.5 4.7  Chloride 96 - 106 mmol/L 99 101 99  CO2 20 - 29 mmol/L _1 Calcium 8.7 - 10.3 mg/dL 9.6 9.3 9.5  Total Protein 6.0 - 8.5 g/dL 6.9 6.7 7.2  Total Bilirubin 0.0 - 1.2 mg/dL 0.4 0.5 0.6  Alkaline Phos 44 - 121 IU/L 145(H) 181(H) 180(H)  AST 0 - 40 IU/L _2 ALT 0 - 32 IU/L _3 DIAGNOSTIC IMAGING:  I have independently reviewed the relevant imaging and discussed with the patient.  ASSESSMENT & PLAN: 1.  Anemia of chronic disease +/-  iron deficiency -This is a combination of CKD and iron deficiency state. - EGD (09/20/2019): Gastritis, gastric polyp, hiatal hernia - Last Feraheme infusion was on 10/26/2019 and 11/02/2019 - No obvious bleeding such as hematemesis, hematochezia, melena, or epistaxis - She complains of significant fatigue - Most recent labs (12/01/2020): Hgb normal at 13.5, ferritin 511, iron saturation 24%.  Stable mild CKD with creatinine  1.17 and GFR 51. - PLAN: No anemia or iron deficiency at this time.  No intervention needed.  Repeat labs and RTC in 6 months.  2.  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 - JAK2 V617F and reflex mutation testing was negative.  BCR/ABL FISH was negative. - Platelet count improved towards normal after IV iron infusions, so this was thought to be reactive. - However, most recent labs (12/01/2020) show platelets 611 despite adequate iron stores. - Lifelong non-smoker.  No definitive history of autoimmune or inflammatory disease, but was previously told that she "might have lupus or fibromyalgia." - PLAN: We will check ESR, CRP, ANA, and RF due to suspicion for inflammatory causes of thrombocytosis.  We will call patient with results and next steps. - Start taking aspirin 81 mg daily to help decrease risk of VTE in the setting of thrombocytosis.  3.  Vitamin B12 deficiency: - She was previously taking B12 cyanocobalamin 1 mg daily - Most recent B12 (12/01/2020): Normal at 673 - PLAN: No need for supplementation at this time.  Recheck B12 and methylmalonic acid in 6 months.  4.  Papillary thyroid cancer, follicular variant - Resection of cancerous thyroid nodule in May 2021 - Followed by Dr. Renne Crigler, who discussed with patient that her thyroid cancer is low risk since it was smaller than 2 cm and without any signs of extension or invasion in the lymphovascular system. - Per Dr. Renne Crigler, no additional treatment was necessary, and she has been following the patient with annual ultrasounds. - PLAN: Continue follow-up with Dr. Renne Crigler  5.  Primary hyperparathyroidism -Patient has history of hypercalcemia, which was found to be primary hypercalcemia - Calcium decreased to normal after surgery (May 2021) - PLAN: Continue follow-up with Dr. Renne Crigler   PLAN SUMMARY & DISPOSITION: Labs today (ESR, CRP, ANA, RF) Labs in 6 months (CBC, CMP, LDH, iron/TIBC, ferritin,  B12/methylmalonic acid, ESR, CRP) RTC after labs in 6 months  All questions were answered. The patient knows to call the clinic with any problems, questions or concerns.  Medical decision making: Moderate  Time spent on visit: I spent 20 minutes counseling the patient face to face. The total time spent in the appointment was 30 minutes and more than 50% was on counseling.   Harriett Rush, PA-C  12/04/2020 9:10 AM

## 2020-12-04 ENCOUNTER — Other Ambulatory Visit: Payer: Self-pay

## 2020-12-04 ENCOUNTER — Encounter (HOSPITAL_COMMUNITY): Payer: Self-pay | Admitting: Physician Assistant

## 2020-12-04 ENCOUNTER — Inpatient Hospital Stay (HOSPITAL_COMMUNITY): Payer: Medicare HMO | Attending: Physician Assistant | Admitting: Physician Assistant

## 2020-12-04 ENCOUNTER — Inpatient Hospital Stay (HOSPITAL_COMMUNITY): Payer: Medicare HMO

## 2020-12-04 VITALS — BP 158/74 | HR 87 | Temp 98.0°F | Resp 20 | Wt 151.9 lb

## 2020-12-04 DIAGNOSIS — Z79899 Other long term (current) drug therapy: Secondary | ICD-10-CM | POA: Insufficient documentation

## 2020-12-04 DIAGNOSIS — I129 Hypertensive chronic kidney disease with stage 1 through stage 4 chronic kidney disease, or unspecified chronic kidney disease: Secondary | ICD-10-CM | POA: Insufficient documentation

## 2020-12-04 DIAGNOSIS — N189 Chronic kidney disease, unspecified: Secondary | ICD-10-CM | POA: Diagnosis not present

## 2020-12-04 DIAGNOSIS — E538 Deficiency of other specified B group vitamins: Secondary | ICD-10-CM | POA: Diagnosis not present

## 2020-12-04 DIAGNOSIS — D509 Iron deficiency anemia, unspecified: Secondary | ICD-10-CM

## 2020-12-04 DIAGNOSIS — D75838 Other thrombocytosis: Secondary | ICD-10-CM | POA: Diagnosis not present

## 2020-12-04 DIAGNOSIS — E210 Primary hyperparathyroidism: Secondary | ICD-10-CM | POA: Diagnosis not present

## 2020-12-04 DIAGNOSIS — D638 Anemia in other chronic diseases classified elsewhere: Secondary | ICD-10-CM | POA: Insufficient documentation

## 2020-12-04 DIAGNOSIS — D75839 Thrombocytosis, unspecified: Secondary | ICD-10-CM

## 2020-12-04 DIAGNOSIS — Z7982 Long term (current) use of aspirin: Secondary | ICD-10-CM | POA: Diagnosis not present

## 2020-12-04 LAB — C-REACTIVE PROTEIN: CRP: 0.7 mg/dL (ref <1.0)

## 2020-12-04 LAB — SEDIMENTATION RATE: Sed Rate: 19 mm/hr (ref 0–22)

## 2020-12-04 NOTE — Patient Instructions (Signed)
[place] at [facility] Discharge Instructions  You were seen today by Tarri Abernethy PA-C for your history of iron deficiency anemia and your elevated platelets ("thrombocytosis").  Your red blood cells/hemoglobin and your iron levels looked great today!  You do not need any IV iron at this time.  I am not sure where your fatigue is coming from, but it is not due to any iron deficiency or anemia at this time.  We will check your levels and see you back again in 6 months.  Your platelets remain elevated.  Previous testing has ruled out genetic mutations or certain types of blood cancer as the cause of your high platelets.  This may be a reaction ("reactive thrombocytosis") to underlying inflammation and stress in your body.  We will check labs today that measure for certain markers of inflammation.  Because of your elevated platelets, you do have a higher risk of blood clots in your legs or lungs.  Start taking aspirin 81 mg daily to help decrease that risk.  LABS: Labs today before leaving the hospital.  Return in 6 months for repeat labs.  OTHER TESTS: No other tests at this time  MEDICATIONS: Start taking aspirin 81 mg daily.  FOLLOW-UP APPOINTMENT: Office visit in 6 months, after labs.   Thank you for choosing [place] at [facility] to provide your oncology and hematology care.  To afford each patient quality time with our provider, please arrive at least 15 minutes before your scheduled appointment time.   If you have a lab appointment with the [facility] please come in thru the Main Entrance and check in at the main information desk.  You need to re-schedule your appointment should you arrive 10 or more minutes late.  We strive to give you quality time with our providers, and arriving late affects you and other patients whose appointments are after yours.  Also, if you no show three or more times for appointments you may be  dismissed from the clinic at the providers discretion.     Again, thank you for choosing [facility].  Our hope is that these requests will decrease the amount of time that you wait before being seen by our physicians.       _____________________________________________________________  Should you have questions after your visit to [facility], please contact our office at [phone] and follow the prompts.  Our office hours are 8:00 a.m. and 4:30 p.m. Monday - Friday.  Please note that voicemails left after 4:00 p.m. may not be returned until the following business day.  We are closed weekends and major holidays.  You do have access to a nurse 24-7, just call the main number to the clinic [phone] and do not press any options, hold on the line and a nurse will answer the phone.    For prescription refill requests, have your pharmacy contact our office and allow 72 hours.    Due to Covid, you will need to wear a mask upon entering the hospital. If you do not have a mask, a mask will be given to you at the Main Entrance upon arrival. For doctor visits, patients may have 1 support person age 68 or older with them. For treatment visits, patients can not have anyone with them due to social distancing guidelines and our immunocompromised population.

## 2020-12-05 LAB — RHEUMATOID FACTOR: Rheumatoid fact SerPl-aCnc: <10 IU/mL (ref <14.0)

## 2020-12-05 LAB — ANA: Anti Nuclear Antibody (ANA): NEGATIVE

## 2020-12-19 ENCOUNTER — Telehealth (HOSPITAL_COMMUNITY): Payer: Self-pay | Admitting: Physician Assistant

## 2020-12-19 NOTE — Telephone Encounter (Signed)
I contact Maria Moss via telephone today to discuss her recent lab results.  Due to her persistent thrombocytosis (negative MPN workup), we checked inflammatory labs with ESR, CRP, ANA, and rheumatoid factor.  These were all normal.  We do not yet have a definitive cause of her thrombocytosis, although this may be related to her underlying thyroid issues and possible thyroid cancer; she has a history of follicular variant of papillary thyroid cancer (s/p right-sided partial thyroidectomy) and new left-sided thyroid nodule that is being monitored by endocrinology.  We did discuss that we could consider bone marrow biopsy for definitive diagnosis of thrombocytosis, but patient would like to watch and wait for now.

## 2021-01-19 DIAGNOSIS — Z9009 Acquired absence of other part of head and neck: Secondary | ICD-10-CM | POA: Diagnosis not present

## 2021-01-19 DIAGNOSIS — Z85850 Personal history of malignant neoplasm of thyroid: Secondary | ICD-10-CM | POA: Diagnosis not present

## 2021-01-19 DIAGNOSIS — E041 Nontoxic single thyroid nodule: Secondary | ICD-10-CM | POA: Diagnosis not present

## 2021-01-23 ENCOUNTER — Encounter: Payer: Self-pay | Admitting: Internal Medicine

## 2021-01-23 ENCOUNTER — Other Ambulatory Visit: Payer: Self-pay

## 2021-01-23 ENCOUNTER — Ambulatory Visit: Payer: Medicare HMO | Admitting: Internal Medicine

## 2021-01-23 VITALS — BP 110/80 | HR 75 | Ht 61.0 in | Wt 152.8 lb

## 2021-01-23 DIAGNOSIS — M8580 Other specified disorders of bone density and structure, unspecified site: Secondary | ICD-10-CM | POA: Diagnosis not present

## 2021-01-23 DIAGNOSIS — E559 Vitamin D deficiency, unspecified: Secondary | ICD-10-CM | POA: Diagnosis not present

## 2021-01-23 DIAGNOSIS — E890 Postprocedural hypothyroidism: Secondary | ICD-10-CM | POA: Diagnosis not present

## 2021-01-23 DIAGNOSIS — E278 Other specified disorders of adrenal gland: Secondary | ICD-10-CM

## 2021-01-23 DIAGNOSIS — E041 Nontoxic single thyroid nodule: Secondary | ICD-10-CM

## 2021-01-23 DIAGNOSIS — E213 Hyperparathyroidism, unspecified: Secondary | ICD-10-CM | POA: Diagnosis not present

## 2021-01-23 DIAGNOSIS — Z85850 Personal history of malignant neoplasm of thyroid: Secondary | ICD-10-CM | POA: Diagnosis not present

## 2021-01-23 LAB — TSH: TSH: 2.79 u[IU]/mL (ref 0.35–5.50)

## 2021-01-23 LAB — VITAMIN D 25 HYDROXY (VIT D DEFICIENCY, FRACTURES): VITD: 57.54 ng/mL (ref 30.00–100.00)

## 2021-01-23 LAB — CALCIUM: Calcium: 9.7 mg/dL (ref 8.4–10.5)

## 2021-01-23 NOTE — Patient Instructions (Signed)
Please stop at the lab.  If we need to start thyroid hormone (Levothyroxine), Take it every day, with water, at least 30 minutes before breakfast, separated by at least 4 hours from: - acid reflux medications - calcium - iron - multivitamins  Continue Alendronate 70 mg before breakfast weekly, with a full glass of water.  Continue vitamin D 4000 units daily.  Will need another thyroid nodule biopsy in March.  Please come back for a follow-up appointment in 1 year.

## 2021-01-23 NOTE — Progress Notes (Signed)
Patient ID: [name], female   DOB: 1950/03/02, 71 y.o.   MRN: [id number]   This visit occurred during the SARS-CoV-2 public health emergency.  Safety protocols were in place, including screening questions prior to the visit, additional usage of staff PPE, and extensive cleaning of exam room while observing appropriate contact time as indicated for disinfecting solutions.   HPI  Maria Moss is a 71 y.o.-year-old female, initially referred by Dr. Rosendo Gros [facility]), presenting for follow-up for a R adrenal incidentaloma.  She also has a history of primary hyperparathyroidism and a right thyroid nodule which turned out to be a follicular variant of papillary thyroid cancer.  Last visit 6 months ago.  She is here with her husband.  Interim history: She continues to have persistent gas and some dysphagia after her hiatal hernia sx 11/2019- improved. Since last visit, she started Fosamax, without any exacerbation of the symptoms after she takes this. No falls or fractures since last visit.  Right adrenal incidentaloma: Reviewed previous imaging studies and investigations: Lumbar MRI (10/28/2017): 5.2 x 4.4 R renal or adrenal heterogeneous soft tissue mass  CT abdomen w/ and w/o contrast (11/04/2017): R adrenal 5 cm complex necrotic appearing and enhancing mass with nodular areas of contrast enhancement and scattered areas of calcification >> possible RCC vs. metastasis  PET CT (11/18/2017):  CHEST: No hypermetabolic mediastinal or hilar nodes. No suspicious pulmonary nodules on the CT scan. ABDOMEN/PELVIS: Large RIGHT renal mass again noted measuring 5.0 by 4.5 cm. Mass has central photopenia and mild peripheral metabolic activity with SUV max equal 3.1 (along the medial border of the mass for example.) This mild activity is similar to liver activity (SUV max equal 3.8). The LEFT adrenal glands normal. No other sites of abnormal FDG uptake.  IMPRESSION: 1. Large RIGHT adrenal mass  with central photopenia and mild peripheral metabolic activity. Findings are nonspecific. Recommend surgical consultation for lesions of this size. 2. No evidence of primary or metastatic carcinoma outside of the adrenal gland.  Of note, she had normal testosterone, estradiol, 24-hour urine cortisol and 24-hour urine metanephrines and catecholamines before the surgery.  After the surgery, a cosyntropin stimulation test was normal.  Please review my last office visit note for more details of the hormonal investigation.  She had right adrenalectomy by Dr. 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  She feels well after surgery.  A cosyntropin stimulation test was normal after surgery and cortisol level remained normal afterwards: Component     Latest Ref Rng & Units 09/01/2018 09/01/2018 09/01/2018 02/02/2019         9:45 AM 10:30 AM 10:58 AM   Cortisol, Plasma     ug/dL 12.3 19.7 22.4 13.3    Vitamin D deficiency:  Reviewed vitamin D levels: Lab Results  Component Value Date   VD25OH 50.4 08/08/2020   VD25OH 50.2 07/20/2019   VD25OH 73.31 02/02/2019   VD25OH 62.95 07/28/2018   VD25OH 29.1 (L) 04/14/2017   VD25OH 43.9 11/02/2013  She was on 3000 units vitamin D daily, now 4000 IU daily, dose increased  by herself at the end of 12/2019.  Primary hyperparathyroidism:  Reviewed pertinent labs: Lab Results  Component Value Date   PTH 52 07/20/2019   PTH Comment 07/20/2019   PTH 46 03/05/2019   PTH 54 02/02/2019   PTH Comment 02/02/2019   PTH 41 11/10/2017   PTH Comment 11/10/2017   Lab Results  Component Value Date   CALCIUM  9.6 12/01/2020   CALCIUM 9.3 08/08/2020   CALCIUM 9.5 01/18/2020   CALCIUM 9.3 08/23/2019   CALCIUM 9.7 08/10/2019   CALCIUM 9.6 07/20/2019   CALCIUM 8.7 (L) 05/30/2019   CALCIUM 10.0 05/25/2019   CALCIUM 10.6 (H) 04/16/2019   CALCIUM 11.0 (H) 03/05/2019   CALCIUM 10.4 (H) 02/13/2019   CALCIUM 10.5 (H) 02/02/2019   CALCIUM 10.0  10/13/2018   CALCIUM 10.1 08/24/2018   CALCIUM 10.3 08/17/2018   CALCIUM 8.6 (L) 06/01/2018   CALCIUM 9.9 05/22/2018   CALCIUM 10.3 01/27/2018   CALCIUM 10.5 (H) 11/10/2017   CALCIUM 10.4 (H) 10/21/2017   CALCIUM 10.5 (H) 06/02/2017   CALCIUM 10.4 (H) 04/14/2017   CALCIUM 10.4 (H) 12/16/2016   CALCIUM 10.5 (H) 09/24/2016   CALCIUM 10.6 (H) 09/10/2016   CALCIUM 10.2 06/11/2016   CALCIUM 10.3 03/05/2016   CALCIUM 9.8 11/28/2015   CALCIUM 10.3 08/29/2015   CALCIUM 10.1 03/21/2015   CALCIUM 10.4 (H) 11/29/2014   CALCIUM 9.9 05/17/2014   CALCIUM 10.3 02/15/2014   CALCIUM 10.3 07/19/2013   CALCIUM 10.0 03/19/2013   CALCIUM 10.6 (H) 11/22/2012   CALCIUM 10.7 (H) 07/21/2012   PCP suggested to start back on calcium and vitamin D after the bone density results returned 04/2020.  Technetium sestamibi scan (05/03/2019): RIGHT inferior parathyroid adenoma.   Cannot exclude coexistent parathyroid adenoma at the RIGHT superior parathyroid gland.   Potential cold nodule within the RIGHT thyroid lobe near upper pole; consider follow-up thyroid ultrasound assessment to further evaluate.  Thyroid U/S (05/11/2019): Parenchymal Echotexture: Moderately heterogenous Isthmus: 4 mm Right lobe: 6.1 x 2.2 x 3.1 cm Left lobe: 5.1 x 1.2 x 1.6 cm  _______________________________________________________   Estimated total number of nodules >/= 1 cm: 1 _____________________________________________   Nodule # 1: Location: Right; Mid Maximum size: 2.2 cm; Other 2 dimensions: 2.0 x 1.8 cm Composition: spongiform (0) Echogenicity: isoechoic (1) This nodule does NOT meet TI-RADS criteria for biopsy or dedicated follow-up. _________________________________________________________   Moderate thyroid heterogeneity with mixed echogenicity and background pseudo nodularity. No hypervascularity. No regional adenopathy.   IMPRESSION: 2.2 cm right mid thyroid TR 1 spongiform nodule correlates with  the cold nodule by the parathyroid scan. This would not meet criteria for any biopsy or follow-up.   Nonspecific thyroid heterogeneity.  She had right inferior parathyroidectomy + right thyroid lobectomy (05/29/2019)-Dr. Gerkin.  Pathology showed a parathyroid adenoma and calcium decreased from 11 preop to 9.7 postop  (06/11/2019).  PTH postop was 51.  However, surprisingly, pathology of the right thyroid nodule showed  follicular variant of papillary thyroid cancer, measuring 1.7 cm, with negative surgical margins.  We did not proceed with RAI treatment.  Thyroid U/S (08/29/2020): Prior right-sided hemithyroidectomy. Pathology reports papillary thyroid carcinoma follicular variant  Parenchymal Echotexture: Mildly heterogenous Isthmus: 0.2 cm Right lobe: Surgically absent Left lobe: 4.6 cm x 1.4 cm x 1.6 cm _________________________________________________________  Nodule # 1: Location: Left; Inferior Maximum size: 1.9 cm; Other 2 dimensions: 1.2 cm x 1.2 cm Composition: cannot determine (2) Echogenicity: hypoechoic (2) Nodule meets criteria for biopsy _________________________________________________________  Rounded hypoechoic nodule in the left cervical nodal station, presumably a lymph node measuring 1.1 cm in long axis.  IMPRESSION: Surgical changes of right hemithyroidectomy.  Left inferior thyroid nodule meets criteria for biopsy, as designated by the newly established ACR TI-RADS criteria, and referral for biopsy is recommended.  Hypoechoic nodule in the left cervical nodal station, nonspecific though presumably a lymph node, not enlarged.  I suggested  biopsy after the above results returned. Unfortunately, the sample was insufficient for analysis: FNA (09/17/2020): Clinical History: Left inferior, 1.9 cm; Other 2 dimensions: 1.2 cm x  1.2 cm, Hypoechoic, TI-RADS total points: 4  Specimen Submitted:  A. THYROID, LEFT INFERIOR NODULE #1, FINE NEEDLE ASPIRATION:    FINAL MICROSCOPIC DIAGNOSIS:  - Scant follicular epithelium present (Bethesda category I)   Pt denies: - feeling nodules in neck - hoarseness  - choking - SOB with lying down + Some dysphagia  - chronic - occasionally  Osteopenia per review of the DXA scan reports from 2018 and 2019:  DXA scan ([facility]) from 04/30/2020: L1-L4: n/a RFN: -2.3 LFN: -1.7 33% distal radius, -1.1% (-2.5%*) FRAX: n/a  DXA scan ([facility]) from 04/14/2017: L1-L4: -1.0 RFN: -1.7 LFN: -1.6 33% distal radius: -0.4 FRAX: 15.8%, 2%  DXA scan ([facility]) from 10/24/2016: L1-L4: -0.5 RFN: -1.0 LFN: -1.2  No history of kidney stones.  ROS: + See HPI + Leg swelling-wears compression pulses, + joint aches  I reviewed pt's medications, allergies, PMH, social hx, family hx, and changes were documented in the history of present illness. Otherwise, unchanged from my initial visit note.  Past Medical History:  Diagnosis Date   Anemia    iron deficiency   Ankle fracture, left 2006   Diverticula, intestine    Mild case.   Fibromyalgia    Dr. Justine Null Dx in [place] years ago  no meds    GERD (gastroesophageal reflux disease)    History of hiatal hernia    Hyperlipidemia    Hyperparathyroidism ([place])    Hypertension    Dx age 89.    Stroke [facility])    Questionable stoke when born or polio not sure pt. has right side weakness toes right foot turns outward and toes curled up some     Past Surgical History:  Procedure Laterality Date   ANKLE SURGERY Left    Broken.  Has screws and metal plate.   CHOLECYSTECTOMY     PARATHYROIDECTOMY Right 05/29/2019   Procedure: RIGHT INFERIOR PARATHYROIDECTOMY;  Surgeon: Armandina Gemma, MD;  Location: [facility];  Service: General;  Laterality: Right;   ROBOTIC ADRENALECTOMY Right 05/31/2018   Procedure: XI ROBOTIC RIGHT ADRENALECTOMY;  Surgeon: Ralene Ok, MD;  Location: [facility];  Service: General;  Laterality: Right;   THYROID LOBECTOMY  Right 05/29/2019   Procedure: RIGHT THYROID LOBECTOMY;  Surgeon: Armandina Gemma, MD;  Location: [facility];  Service: General;  Laterality: Right;    Social History   Socioeconomic History   Marital status: Married    Spouse name: Not on file   Number of children: Not on file   Years of education: Not on file   Highest education level: Not on file  Occupational History   Not on file  Tobacco Use   Smoking status: Never   Smokeless tobacco: Never  Vaping Use   Vaping Use: Never used  Substance and Sexual Activity   Alcohol use: No   Drug use: No   Sexual activity: Not Currently  Other Topics Concern   Not on file  Social History Narrative   Not on file   Social Determinants of Health   Financial Resource Strain: Not on file  Food Insecurity: Not on file  Transportation Needs: Not on file  Physical Activity: Not on file  Stress: Not on file  Social Connections: Not on file  Intimate Partner Violence: Not on file    Current Outpatient Medications on File Prior  to Visit  Medication Sig Dispense Refill   acetaminophen (TYLENOL) 500 MG tablet Take by mouth.     alendronate (FOSAMAX) 70 MG tablet Take 1 tablet (70 mg total) by mouth every 7 (seven) days. Take with a full glass of water on an empty stomach. 12 tablet 3   alum & mag hydroxide-simeth (MAALOX PLUS) 400-400-40 MG/5ML suspension Take by mouth.     amLODipine (NORVASC) 5 MG tablet Take 1 tablet (5 mg total) by mouth daily. 90 tablet 3   cetirizine (ZYRTEC) 10 MG tablet Take 10 mg by mouth daily.     Cholecalciferol 25 MCG (1000 UT) tablet Take 3,000 Units by mouth daily.      dicyclomine (BENTYL) 10 MG capsule Take 10 mg by mouth in the morning and at bedtime.     DULoxetine (CYMBALTA) 60 MG capsule TAKE 1 CAPSULE EVERY DAY 90 capsule 3   famotidine (PEPCID) 40 MG tablet 40 mg 2 (two) times daily.     famotidine (PEPCID) 40 MG tablet 20 mg every morning.     fluticasone (FLONASE) 50 MCG/ACT nasal spray USE 2 SPRAYS IN  EACH NOSTRIL EVERY DAY 48 g 1   furosemide (LASIX) 20 MG tablet TAKE 1 TABLET EVERY DAY 90 tablet 3   HEMP OIL-VANILLYL BUTYL ETHER EX Apply topically.     hyoscyamine (ANASPAZ) 0.125 MG TBDP disintergrating tablet Take 1 tablet (0.125 mg total) by mouth every 6 (six) hours as needed. 30 tablet 4   Loperamide HCl (IMODIUM PO) Take by mouth as needed.     loratadine (CLARITIN) 10 MG tablet Take 10 mg by mouth daily.     meloxicam (MOBIC) 15 MG tablet TAKE 1 TABLET EVERY DAY 90 tablet 3   Omega-3 Fatty Acids (FISH OIL) 1000 MG CAPS Take 1,000 mg by mouth daily.      ondansetron (ZOFRAN) 4 MG tablet Take 1 tablet (4 mg total) by mouth every 8 (eight) hours as needed for nausea or vomiting. 20 tablet 0   pantoprazole (PROTONIX) 40 MG tablet TAKE 1 TABLET (40 MG TOTAL) BY MOUTH DAILY. 90 tablet 1   Probiotic Product (PROBIOTIC & ACIDOPHILUS EX ST PO) Take 1 capsule by mouth at bedtime.      traMADol (ULTRAM) 50 MG tablet Take 1 tablet (50 mg total) by mouth every 8 (eight) hours as needed (mild pain). 90 tablet 1   No current facility-administered medications on file prior to visit.    Allergies  Allergen Reactions   Codeine Nausea And Vomiting   Sulfa Antibiotics     Hair loss, yellowed skin   Doxycycline Hyclate Itching    Family History  Problem Relation Age of Onset   Heart disease Mother        CHF   Cancer Mother        Uterine   Osteoporosis Mother    COPD Sister    Asthma Sister    Migraines Sister    PE: BP 110/80 (BP Location: Left Arm, Patient Position: Sitting, Cuff Size: Normal)    Pulse 75    Ht 5\' 1"  (1.549 m)    Wt 152 lb 12.8 oz (69.3 kg)    SpO2 97%    BMI 28.87 kg/m  Wt Readings from Last 3 Encounters:  01/23/21 152 lb 12.8 oz (69.3 kg)  12/04/20 151 lb 14.4 oz (68.9 kg)  11/07/20 153 lb (69.4 kg)   Constitutional: normal weight, in NAD Eyes: PERRLA, EOMI, no exophthalmos ENT: moist mucous membranes,  no neck masses palpable, no cervical  lymphadenopathy Cardiovascular: RRR, No MRG, + B LE edema (wears compression hoses) Respiratory: CTA B Musculoskeletal: no deformities, strength intact in all 4 Skin: moist, warm, no rashes Neurological: + Mild tremor with outstretched hands, DTR normal in all 4  ASSESSMENT: 1. Adrenal incidentaloma  2.  Primary hyperparathyroidism  3.  Vitamin D deficiency  4.  Follicular variant of papillary thyroid cancer  5. S/p right thyroid lobectomy  6.  Osteopenia  PLAN:  1. Patient with a history of right adrenal nodule discovered incidentally. This had necrotic areas and scattered areas of calcifications, and measured 5 x 4.5 cm.  The PET CT scan did not show increased FDG uptake to suggest malignancy.  We ruled out adrenal hormone hyper secretion and I cleared her for surgery.  She had mass resected and the pathology was benign (cavernous hemangioma).  After surgery, her right leg pain resolved. -Postsurgical cosyntropin stimulation test was normal, ruling out adrenal insufficiency -No further investigation is needed for this  2.  Primary hyperparathyroidism -Patient with history of hypercalcemia and nonsuppressed PTH, with technetium sestamibi scan positive for a R inf.  adenoma.   this was resected and calcium decreased to normal after surgery. -Calcium and PTH levels were normal postop.   -Latest calcium level was normal: Lab Results  Component Value Date   CALCIUM 9.6 12/01/2020   PHOS 3.6 03/05/2019  -We will not repeat this today  3.  Vitamin D deficiency -On 4000 units vitamin D daily -Latest vitamin D level was normal Lab Results  Component Value Date   VD25OH 50.4 08/08/2020  -We will repeat this today  4.  Follicular variant of papillary thyroid cancer and 6. L thyroid nodule -her thyroid cancer is low risk since it was smaller than 2 cm and without any signs of extension or invasion in the lymphovascular system -Because of the low risk of thyroid cancer, completion  thyroidectomy was not mandatory -No neck compression symptoms and no masses palpated in her neck today -However, on the latest neck ultrasound from 08/2020, she had a L thyroid nodule, which we tried to biopsy with inconclusive results due to insufficient sample.  -She mentions that the biopsy was uncomfortable and she still has some sensitivity at the site. -However, she does agree to repeat another FNA in 2 months  6. S/p right thyroid lobectomy -Latest TSH was normal: Lab Results  Component Value Date   TSH 2.390 08/08/2020  -We will repeat this now -She denies hypothyroid symptoms -We did discuss about how to take levothyroxine in case she needs to start: Every day, with water, at least 30 minutes before breakfast, separated by at least 4 hours from: - acid reflux medications - calcium - iron - multivitamins  7.  Osteopenia -Reviewed the DXA scan report from 2018, 2019 and 04/30/2020.  On the latest scan, from 04/30/2020, the right femoral neck bone density was significantly worse, as well as 33% distal radius BMD.  We discussed about options for treatment at last visit.  I advised her that the bone density will most likely improve after parathyroid surgery, but I also suggested oral alendronate once a week and discussed how to take it correctly.  At last visit she agreed to start it.  I did advise him to let me know if she had problems tolerating it (due to the previous hiatal hernia surgery). -we started alendronate after last visit (08/2020) and she tolerates it well.  No hip/thigh/jaw pain -  Plan to repeat DXA scan next year -I will see her back in 1 year  Component     Latest Ref Rng & Units 01/23/2021  Calcium     8.4 - 10.5 mg/dL 9.7  TSH     0.35 - 5.50 uIU/mL 2.79  VITD     30.00 - 100.00 ng/mL 57.54  Labs are all normal.  Philemon Kingdom, MD PhD [facility] Endocrinology

## 2021-01-29 ENCOUNTER — Encounter: Payer: Self-pay | Admitting: Nurse Practitioner

## 2021-01-29 ENCOUNTER — Ambulatory Visit (INDEPENDENT_AMBULATORY_CARE_PROVIDER_SITE_OTHER): Payer: Medicare HMO | Admitting: Nurse Practitioner

## 2021-01-29 DIAGNOSIS — R399 Unspecified symptoms and signs involving the genitourinary system: Secondary | ICD-10-CM | POA: Diagnosis not present

## 2021-01-29 MED ORDER — CIPROFLOXACIN HCL 500 MG PO TABS: 500.0000 mg | ORAL_TABLET | Freq: Two times a day (BID) | ORAL | 0 refills | Status: DC

## 2021-01-29 NOTE — Patient Instructions (Signed)
1. Take meds as prescribed 2. Use a cool mist humidifier especially during the winter months and when heat has been humid. 3. Use saline nose sprays frequently 4. Saline irrigations of the nose can be very helpful if done frequently.  * 4X daily for 1 week*  * Use of a nettie pot can be helpful with this. Follow directions with this* 5. Drink plenty of fluids 6. Keep thermostat turn down low 7.For any cough or congestion- delsym or mucinex 8. For fever or aces or pains- take tylenol or ibuprofen appropriate for age and weight.  * for fevers greater than 101 orally you may alternate ibuprofen and tylenol every  3 hours.    

## 2021-01-29 NOTE — Progress Notes (Signed)
° °  Virtual Visit  Note Due to COVID-19 pandemic this visit was conducted virtually. This visit type was conducted due to national recommendations for restrictions regarding the COVID-19 Pandemic (e.g. social distancing, sheltering in place) in an effort to limit this patient's exposure and mitigate transmission in our community. All issues noted in this document were discussed and addressed.  A physical exam was not performed with this format.  I connected with Maria Moss on 01/29/21 at 2:41 by telephone and verified that I am speaking with the correct person using t:wo identifiers. Maria Fila Hegler is currently located at home and her husband is currently with her during visit. The provider, Le[place]-Margaret Hassell Done, FNP is located in their office at time of visit.  I discussed the limitations, risks, security and privacy concerns of performing an evaluation and management service by telephone and the availability of in person appointments. I also discussed with the patient that there may be a patient responsible charge related to this service. The patient expressed understanding and agreed to proceed.   History and Present Illness:  Urinary Tract Infection  This is a new problem. The current episode started in the past 7 days. The problem occurs every urination. The problem has been gradually worsening. The quality of the pain is described as burning. The pain is at a severity of 5/10. The pain is moderate. There has been no fever. She is Sexually active. There is No history of pyelonephritis. Associated symptoms include frequency, hesitancy and urgency. Pertinent negatives include no chills or discharge. She has tried nothing for the symptoms.     Review of Systems  Constitutional:  Negative for chills.  Genitourinary:  Positive for frequency, hesitancy and urgency.    Observations/Objective: Alert and oriented- answers all questions appropriately No distress   Assessment and Plan: Maria Fila Whiten in  today with chief complaint of Urinary Tract Infection   1. UTI symptoms Take medication as prescribe Cotton underwear Take shower not bath Cranberry juice, yogurt Force fluids AZO over the counter X2 days RTO prn  Meds ordered this encounter  Medications   ciprofloxacin (CIPRO) 500 MG tablet    Sig: Take 1 tablet (500 mg total) by mouth 2 (two) times daily.    Dispense:  10 tablet    Refill:  0    Order Specific Question:   Supervising Provider    Answer:   Caryl Pina A [[id number]]      Follow Up Instructions: prn    I discussed the assessment and treatment plan with the patient. The patient was provided an opportunity to ask questions and all were answered. The patient agreed with the plan and demonstrated an understanding of the instructions.   The patient was advised to call back or seek an in-person evaluation if the symptoms worsen or if the condition fails to improve as anticipated.  The above assessment and management plan was discussed with the patient. The patient verbalized understanding of and has agreed to the management plan. Patient is aware to call the clinic if symptoms persist or worsen. Patient is aware when to return to the clinic for a follow-up visit. Patient educated on when it is appropriate to go to the emergency department.   Time call ended:  2:53  I provided 12 minutes of  non face-to-face time during this encounter.    Brigitta-Margaret Hassell Done, FNP  2:41

## 2021-02-06 ENCOUNTER — Encounter: Payer: Self-pay | Admitting: Family Medicine

## 2021-02-06 ENCOUNTER — Ambulatory Visit (INDEPENDENT_AMBULATORY_CARE_PROVIDER_SITE_OTHER): Payer: Medicare HMO | Admitting: Family Medicine

## 2021-02-06 VITALS — BP 152/83 | HR 92 | Ht 61.0 in | Wt 157.0 lb

## 2021-02-06 DIAGNOSIS — Z79899 Other long term (current) drug therapy: Secondary | ICD-10-CM

## 2021-02-06 DIAGNOSIS — R300 Dysuria: Secondary | ICD-10-CM | POA: Diagnosis not present

## 2021-02-06 DIAGNOSIS — Z1211 Encounter for screening for malignant neoplasm of colon: Secondary | ICD-10-CM

## 2021-02-06 DIAGNOSIS — M5136 Other intervertebral disc degeneration, lumbar region: Secondary | ICD-10-CM

## 2021-02-06 LAB — URINALYSIS
Bilirubin, UA: NEGATIVE
Glucose, UA: NEGATIVE
Ketones, UA: NEGATIVE
Nitrite, UA: NEGATIVE
Protein,UA: NEGATIVE
RBC, UA: NEGATIVE
Specific Gravity, UA: 1.015 (ref 1.005–1.030)
Urobilinogen, Ur: 0.2 mg/dL (ref 0.2–1.0)
pH, UA: 7 (ref 5.0–7.5)

## 2021-02-06 MED ORDER — TRAMADOL HCL 50 MG PO TABS: 50.0000 mg | ORAL_TABLET | Freq: Three times a day (TID) | ORAL | 2 refills | Status: DC | PRN

## 2021-02-06 NOTE — Progress Notes (Signed)
BP (!) 152/83    Pulse 92    Ht 5\' 1"  (1.549 m)    Wt 157 lb (71.2 kg)    SpO2 100%    BMI 29.66 kg/m    Subjective:   Patient ID: Maria Moss, female    DOB: 01-Dec-1950, 71 y.o.   MRN: [id number]  HPI: Maria Moss is a 71 y.o. female presenting on 02/06/2021 for Medical Management of Chronic Issues and Urinary Tract Infection   HPI Chronic degenerative disc disease Pain assessment: Cause of pain-degenerative disc disease lumbar Pain location-lower back bilateral, also scoliosis Pain on scale of 1-10- 5 Frequency-Daily What increases pain-certain movements and walking. What makes pain Better-tramadol Effects on ADL -does limit walking but does do most of her ADLs at home Any change in general medical condition-none  Current opioids rx-tramadol 50 mg every 8 hours.  As needed # meds rx-90 Effectiveness of current meds-works well Adverse reactions from pain meds-none Morphine equivalent-15  Pill count performed-No Last drug screen -01/31/2020 ( high risk q86m, moderate risk q66m, low risk yearly ) Urine drug screen today- Yes Was the [facility] reviewed-yes  If yes were their any concerning findings? -None   Overdose risk: 230 No flowsheet data found.   Pain contract signed on: Today   Patient is having some dysuria and urinary frequency 1 to leave urine today.  She denies any fevers or chills or flank pain.  She did Cipro and finished 3 days ago.  She still having some burning and pressure but has improved  Relevant past medical, surgical, family and social history reviewed and updated as indicated. Interim medical history since our last visit reviewed. Allergies and medications reviewed and updated.  Review of Systems  Constitutional:  Negative for chills and fever.  Eyes:  Negative for redness and visual disturbance.  Respiratory:  Negative for chest tightness and shortness of breath.   Cardiovascular:  Negative for chest pain and leg swelling.  Genitourinary:  Positive  for dysuria and frequency. Negative for difficulty urinating, urgency, vaginal bleeding, vaginal discharge and vaginal pain.  Musculoskeletal:  Positive for arthralgias and back pain. Negative for gait problem.  Skin:  Negative for rash.  Neurological:  Negative for light-headedness and headaches.  Psychiatric/Behavioral:  Negative for agitation and behavioral problems.   All other systems reviewed and are negative.  Per HPI unless specifically indicated above   Allergies as of 02/06/2021       Reactions   Codeine Nausea And Vomiting   Sulfa Antibiotics    Hair loss, yellowed skin   Doxycycline Hyclate Itching        Medication List        Accurate as of February 06, 2021 12:11 PM. If you have any questions, ask your nurse or doctor.          acetaminophen 500 MG tablet Commonly known as: TYLENOL Take by mouth.   alendronate 70 MG tablet Commonly known as: FOSAMAX Take 1 tablet (70 mg total) by mouth every 7 (seven) days. Take with a full glass of water on an empty stomach.   alum & mag hydroxide-simeth [phone] MG/5ML suspension Commonly known as: MAALOX PLUS Take by mouth.   amLODipine 5 MG tablet Commonly known as: NORVASC Take 1 tablet (5 mg total) by mouth daily.   cetirizine 10 MG tablet Commonly known as: ZYRTEC Take 10 mg by mouth daily.   Cholecalciferol 25 MCG (1000 UT) tablet Take 3,000 Units by mouth daily.  ciprofloxacin 500 MG tablet Commonly known as: Cipro Take 1 tablet (500 mg total) by mouth 2 (two) times daily.   dicyclomine 10 MG capsule Commonly known as: BENTYL Take 10 mg by mouth in the morning and at bedtime.   DULoxetine 60 MG capsule Commonly known as: CYMBALTA TAKE 1 CAPSULE EVERY DAY   famotidine 40 MG tablet Commonly known as: PEPCID 40 mg 2 (two) times daily.   famotidine 40 MG tablet Commonly known as: PEPCID 20 mg every morning.   Fish Oil 1000 MG Caps Take 1,000 mg by mouth daily.   fluticasone 50 MCG/ACT  nasal spray Commonly known as: FLONASE USE 2 SPRAYS IN EACH NOSTRIL EVERY DAY   furosemide 20 MG tablet Commonly known as: LASIX TAKE 1 TABLET EVERY DAY   HEMP OIL-VANILLYL BUTYL ETHER EX Apply topically.   hyoscyamine 0.125 MG Tbdp disintergrating tablet Commonly known as: ANASPAZ Take 1 tablet (0.125 mg total) by mouth every 6 (six) hours as needed.   IMODIUM PO Take by mouth as needed.   loratadine 10 MG tablet Commonly known as: CLARITIN Take 10 mg by mouth daily.   meloxicam 15 MG tablet Commonly known as: MOBIC TAKE 1 TABLET EVERY DAY   ondansetron 4 MG tablet Commonly known as: Zofran Take 1 tablet (4 mg total) by mouth every 8 (eight) hours as needed for nausea or vomiting.   pantoprazole 40 MG tablet Commonly known as: PROTONIX TAKE 1 TABLET (40 MG TOTAL) BY MOUTH DAILY.   PROBIOTIC & ACIDOPHILUS EX ST PO Take 1 capsule by mouth at bedtime.   traMADol 50 MG tablet Commonly known as: ULTRAM Take 1 tablet (50 mg total) by mouth every 8 (eight) hours as needed (mild pain).         Objective:   BP (!) 152/83    Pulse 92    Ht 5\' 1"  (1.549 m)    Wt 157 lb (71.2 kg)    SpO2 100%    BMI 29.66 kg/m   Wt Readings from Last 3 Encounters:  02/06/21 157 lb (71.2 kg)  01/23/21 152 lb 12.8 oz (69.3 kg)  12/04/20 151 lb 14.4 oz (68.9 kg)    Physical Exam Vitals and nursing note reviewed.  Constitutional:      General: She is not in acute distress.    Appearance: She is well-developed. She is not diaphoretic.  Eyes:     Conjunctiva/sclera: Conjunctivae normal.  Cardiovascular:     Rate and Rhythm: Normal rate and regular rhythm.     Heart sounds: Normal heart sounds. No murmur heard. Pulmonary:     Effort: Pulmonary effort is normal. No respiratory distress.     Breath sounds: Normal breath sounds. No wheezing.  Musculoskeletal:     Comments: Scoliosis with visible lean to the right side.  Skin:    General: Skin is warm and dry.     Findings: No rash.   Neurological:     Mental Status: She is alert and oriented to person, place, and time.     Coordination: Coordination normal.  Psychiatric:        Behavior: Behavior normal.   Urinalysis: 1+ leukocyte otherwise clear, will await for urine culture.     Assessment & Plan:   Problem List Items Addressed This Visit       Musculoskeletal and Integument   Degenerative disc disease, lumbar (Chronic)   Relevant Medications   traMADol (ULTRAM) 50 MG tablet   Other Relevant Orders   ToxASSURE  Select 13 (MW), Urine   Other Visit Diagnoses     Controlled substance agreement signed    -  Primary   Relevant Orders   ToxASSURE Select 31 (MW), Urine   Dysuria       Relevant Orders   Urine Culture   Urinalysis   Colon cancer screening       Relevant Orders   Cologuard       Continue tramadol. Urine does not show anything major, will await for culture and recommend increased hydration. Follow up plan: Return in about 3 months (around 05/06/2021), or if symptoms worsen or fail to improve, for Chronic pain and hypertension and cholesterol.  Counseling provided for all of the vaccine components Orders Placed This Encounter  Procedures   Urine Culture   ToxASSURE Select 13 (MW), Urine   Urinalysis   Cologuard    Caryl Pina, MD [facility] 02/06/2021, 12:11 PM

## 2021-02-08 LAB — URINE CULTURE

## 2021-02-12 LAB — TOXASSURE SELECT 13 (MW), URINE

## 2021-03-03 ENCOUNTER — Other Ambulatory Visit: Payer: Self-pay | Admitting: Family Medicine

## 2021-03-03 DIAGNOSIS — I10 Essential (primary) hypertension: Secondary | ICD-10-CM

## 2021-03-11 DIAGNOSIS — Z1211 Encounter for screening for malignant neoplasm of colon: Secondary | ICD-10-CM | POA: Diagnosis not present

## 2021-03-19 ENCOUNTER — Other Ambulatory Visit: Payer: Self-pay | Admitting: Family Medicine

## 2021-03-21 LAB — COLOGUARD: COLOGUARD: POSITIVE — AB

## 2021-03-23 ENCOUNTER — Other Ambulatory Visit: Payer: Self-pay

## 2021-03-23 DIAGNOSIS — R195 Other fecal abnormalities: Secondary | ICD-10-CM

## 2021-04-01 ENCOUNTER — Ambulatory Visit
Admission: RE | Admit: 2021-04-01 | Discharge: 2021-04-01 | Disposition: A | Discharge status: Home / Self Care | Payer: Medicare HMO | Source: Ambulatory Visit | Attending: Internal Medicine | Admitting: Internal Medicine

## 2021-04-01 ENCOUNTER — Other Ambulatory Visit (HOSPITAL_COMMUNITY)
Admission: RE | Admit: 2021-04-01 | Discharge: 2021-04-01 | Disposition: A | Discharge status: Home / Self Care | Payer: Medicare HMO | Source: Ambulatory Visit | Attending: Physician Assistant | Admitting: Physician Assistant

## 2021-04-01 DIAGNOSIS — E041 Nontoxic single thyroid nodule: Secondary | ICD-10-CM

## 2021-04-01 DIAGNOSIS — E0789 Other specified disorders of thyroid: Secondary | ICD-10-CM | POA: Diagnosis not present

## 2021-04-02 LAB — CYTOLOGY - NON PAP

## 2021-04-08 ENCOUNTER — Inpatient Hospital Stay: Admission: RE | Admit: 2021-04-08 | Payer: Medicare HMO | Source: Ambulatory Visit

## 2021-04-08 ENCOUNTER — Ambulatory Visit
Admission: RE | Admit: 2021-04-08 | Discharge: 2021-04-08 | Disposition: A | Discharge status: Home / Self Care | Payer: Medicare HMO | Source: Ambulatory Visit | Attending: Family Medicine | Admitting: Family Medicine

## 2021-04-08 DIAGNOSIS — Z1231 Encounter for screening mammogram for malignant neoplasm of breast: Secondary | ICD-10-CM | POA: Diagnosis not present

## 2021-04-14 ENCOUNTER — Encounter (HOSPITAL_COMMUNITY): Payer: Self-pay

## 2021-04-22 ENCOUNTER — Other Ambulatory Visit: Payer: Self-pay | Admitting: Family Medicine

## 2021-04-22 DIAGNOSIS — F411 Generalized anxiety disorder: Secondary | ICD-10-CM

## 2021-04-22 DIAGNOSIS — M545 Low back pain, unspecified: Secondary | ICD-10-CM

## 2021-04-23 ENCOUNTER — Ambulatory Visit: Payer: Medicare HMO | Admitting: Emergency Medicine

## 2021-04-23 VITALS — BP 162/85

## 2021-04-23 DIAGNOSIS — I10 Essential (primary) hypertension: Secondary | ICD-10-CM

## 2021-04-23 NOTE — Progress Notes (Signed)
Patient aware and verbalized understanding she has appt 05/12 and she will keep a check on BP readings  ?

## 2021-04-23 NOTE — Progress Notes (Signed)
Have her take 2 of her amlodipine daily and schedule her a sooner appointment to come in to be reevaluated for blood pressure ?

## 2021-04-23 NOTE — Progress Notes (Signed)
Patient stopped into office today for a blood pressure check she states that blood pressures have been high at home. BP's in office were 182/76 and then 162/85. Please advise.  ? ? ?Tonilynn Bieker. LPN   ?

## 2021-04-27 ENCOUNTER — Other Ambulatory Visit: Payer: Self-pay | Admitting: Family Medicine

## 2021-04-27 DIAGNOSIS — K219 Gastro-esophageal reflux disease without esophagitis: Secondary | ICD-10-CM

## 2021-04-28 ENCOUNTER — Telehealth: Payer: Self-pay

## 2021-04-28 ENCOUNTER — Other Ambulatory Visit: Payer: Self-pay | Admitting: Family Medicine

## 2021-04-28 NOTE — Telephone Encounter (Signed)
Pt called to follow up on Afirma results. ?

## 2021-04-28 NOTE — Telephone Encounter (Signed)
T, ?I am so sorry, the results returned sometime ago and I remember that I put a message to call her with the labs, but now I do not find a message in her chart...  ? The results were benign.  This is good news and no intervention is needed for now. ?

## 2021-04-28 NOTE — Telephone Encounter (Signed)
Contacted pt with results. Pt verbalized understanding. ?

## 2021-05-01 ENCOUNTER — Ambulatory Visit: Payer: Medicare HMO | Admitting: Family Medicine

## 2021-05-14 ENCOUNTER — Encounter (HOSPITAL_COMMUNITY): Payer: Self-pay | Admitting: Hematology

## 2021-05-15 ENCOUNTER — Encounter: Payer: Self-pay | Admitting: Family Medicine

## 2021-05-15 ENCOUNTER — Ambulatory Visit (INDEPENDENT_AMBULATORY_CARE_PROVIDER_SITE_OTHER): Payer: Medicare HMO | Admitting: Family Medicine

## 2021-05-15 VITALS — BP 142/79 | HR 94 | Temp 97.9°F | Resp 20 | Ht 61.0 in | Wt 157.0 lb

## 2021-05-15 DIAGNOSIS — M5136 Other intervertebral disc degeneration, lumbar region: Secondary | ICD-10-CM | POA: Diagnosis not present

## 2021-05-15 DIAGNOSIS — I10 Essential (primary) hypertension: Secondary | ICD-10-CM | POA: Diagnosis not present

## 2021-05-15 DIAGNOSIS — G8929 Other chronic pain: Secondary | ICD-10-CM | POA: Diagnosis not present

## 2021-05-15 DIAGNOSIS — M545 Low back pain, unspecified: Secondary | ICD-10-CM | POA: Diagnosis not present

## 2021-05-15 DIAGNOSIS — E782 Mixed hyperlipidemia: Secondary | ICD-10-CM | POA: Diagnosis not present

## 2021-05-15 DIAGNOSIS — F411 Generalized anxiety disorder: Secondary | ICD-10-CM

## 2021-05-15 DIAGNOSIS — K219 Gastro-esophageal reflux disease without esophagitis: Secondary | ICD-10-CM

## 2021-05-15 MED ORDER — LISINOPRIL 20 MG PO TABS: 20.0000 mg | ORAL_TABLET | Freq: Every day | ORAL | 3 refills | Status: DC

## 2021-05-15 MED ORDER — PANTOPRAZOLE SODIUM 40 MG PO TBEC: 40.0000 mg | DELAYED_RELEASE_TABLET | Freq: Every day | ORAL | 3 refills | Status: DC

## 2021-05-15 MED ORDER — MELOXICAM 15 MG PO TABS: 15.0000 mg | ORAL_TABLET | Freq: Every day | ORAL | 3 refills | Status: DC

## 2021-05-15 MED ORDER — TRAMADOL HCL 50 MG PO TABS: 50.0000 mg | ORAL_TABLET | Freq: Three times a day (TID) | ORAL | 2 refills | Status: DC | PRN

## 2021-05-15 MED ORDER — DULOXETINE HCL 60 MG PO CPEP: 60.0000 mg | ORAL_CAPSULE | Freq: Every day | ORAL | 3 refills | Status: DC

## 2021-05-15 MED ORDER — AMLODIPINE BESYLATE 5 MG PO TABS: 5.0000 mg | ORAL_TABLET | Freq: Every day | ORAL | 3 refills | Status: DC

## 2021-05-15 NOTE — Progress Notes (Signed)
? ?BP (!) 142/79   Pulse 94   Temp 97.9 ?F (36.6 ?C) (Temporal)   Resp 20   Ht 5' 1"  (1.549 m)   Wt 157 lb (71.2 kg)   SpO2 93%   BMI 29.66 kg/m?   ? ?Subjective:  ? ?Patient ID: Maria Moss, female    DOB: July 21, 1950, 71 y.o.   MRN: [id number] ? ?HPI: ?Maria Moss is a 71 y.o. female presenting on 05/15/2021 for Hypertension, Hyperlipidemia, and Pain Management ? ? ?HPI ?Hypertension ?Patient is currently on furosemide and amlodipine, and their blood pressure today is 142/79 which is better than it was in the 160s but she says the increase in amlodipine has caused her to have significantly more swelling.. Patient denies any lightheadedness or dizziness. Patient denies headaches, blurred vision, chest pains, shortness of breath, or weakness. Denies any side effects from medication and is content with current medication.  ? ?Hyperlipidemia ?Patient is coming in for recheck of his hyperlipidemia. The patient is currently taking fish oil. They deny any issues with myalgias or history of liver damage from it. They deny any focal numbness or weakness or chest pain.  ? ?Degenerative disc disease ?Pain assessment: ?Cause of pain-degenerative disc disease lumbar ?Pain location-lower back and hips ?Pain on scale of 1-10:4 ?Frequency-Daily with symptoms are worse ?What increases pain-being on her feet for longer periods and walking more ?What makes pain Better-tramadol and rest ?Effects on ADL -minimal ?Any change in general medical condition-none ? ?Current opioids rx-tramadol 50 mg 3 times daily as needed ?# meds rx-90 ?Effectiveness of current meds-works well for the most part it takes the edge off ?Adverse reactions from pain meds-none ?Morphine equivalent-15 ? ?Pill count performed-No ?Last drug screen -02/25/2021 ?( high risk q68m moderate risk q640mlow risk yearly ) ?Urine drug screen today- No ?[place] ? If yes were their any concerning findings? -None ? ?Pain contract signed on:  02/25/2021 ? ?Relevant past medical, surgical, family and social history reviewed and updated as indicated. Interim medical history since our last visit reviewed. ?Allergies and medications reviewed and updated. ? ?Review of Systems  ?Constitutional:  Negative for chills and fever.  ?Eyes:  Negative for redness and visual disturbance.  ?Respiratory:  Negative for chest tightness and shortness of breath.   ?Cardiovascular:  Positive for leg swelling. Negative for chest pain.  ?Genitourinary:  Negative for difficulty urinating and dysuria.  ?Musculoskeletal:  Negative for back pain and gait problem.  ?Skin:  Negative for rash.  ?Neurological:  Negative for light-headedness and headaches.  ?Psychiatric/Behavioral:  Negative for agitation and behavioral problems.   ?All other systems reviewed and are negative. ? ?Per HPI unless specifically indicated above ? ? ?Allergies as of 05/15/2021   ? ?   Reactions  ? Codeine Nausea And Vomiting  ? Sulfa Antibiotics   ? Hair loss, yellowed skin  ? Doxycycline Hyclate Itching  ? ?  ? ?  ?Medication List  ?  ? ?  ? Accurate as of May 15, 2021 10:53 AM. If you have any questions, ask your nurse or doctor.  ?  ?  ? ?  ? ?STOP taking these medications   ? ?ciprofloxacin 500 MG tablet ?Commonly known as: Cipro ?Stopped by: JoWorthy RancherMD ?  ?HEMP OIL-VANILLYL BUTYL ETHER EX ?Stopped by: JoWorthy RancherMD ?  ?hyoscyamine 0.125 MG Tbdp disintergrating tablet ?Commonly known as: ANASPAZ ?Stopped by: JoWorthy RancherMD ?  ? ?  ? ?  TAKE these medications   ? ?acetaminophen 500 MG tablet ?Commonly known as: TYLENOL ?Take by mouth. ?  ?alendronate 70 MG tablet ?Commonly known as: FOSAMAX ?Take 1 tablet (70 mg total) by mouth every 7 (seven) days. Take with a full glass of water on an empty stomach. ?  ?alum & mag hydroxide-simeth [phone] MG/5ML suspension ?Commonly known as: MAALOX PLUS ?Take by mouth. ?  ?amLODipine 5 MG tablet ?Commonly known as: NORVASC ?Take 1 tablet (5  mg total) by mouth daily. ?What changed: additional instructions ?  ?cetirizine 10 MG tablet ?Commonly known as: ZYRTEC ?Take 10 mg by mouth daily. ?  ?Cholecalciferol 25 MCG (1000 UT) tablet ?Take 3,000 Units by mouth daily. ?  ?dicyclomine 10 MG capsule ?Commonly known as: BENTYL ?Take 10 mg by mouth in the morning and at bedtime. ?  ?DULoxetine 60 MG capsule ?Commonly known as: CYMBALTA ?Take 1 capsule (60 mg total) by mouth daily. ?  ?Fish Oil 1000 MG Caps ?Take 1,000 mg by mouth daily. ?  ?fluticasone 50 MCG/ACT nasal spray ?Commonly known as: FLONASE ?USE 2 SPRAYS IN EACH NOSTRIL EVERY DAY ?  ?furosemide 20 MG tablet ?Commonly known as: LASIX ?TAKE 1 TABLET EVERY DAY ?  ?IMODIUM PO ?Take by mouth as needed. ?  ?lisinopril 20 MG tablet ?Commonly known as: ZESTRIL ?Take 1 tablet (20 mg total) by mouth daily. ?Started by: Worthy Rancher, MD ?  ?loratadine 10 MG tablet ?Commonly known as: CLARITIN ?Take 10 mg by mouth daily. ?  ?meloxicam 15 MG tablet ?Commonly known as: MOBIC ?Take 1 tablet (15 mg total) by mouth daily. ?  ?ondansetron 4 MG tablet ?Commonly known as: Zofran ?Take 1 tablet (4 mg total) by mouth every 8 (eight) hours as needed for nausea or vomiting. ?  ?pantoprazole 40 MG tablet ?Commonly known as: PROTONIX ?Take 1 tablet (40 mg total) by mouth daily. ?  ?PROBIOTIC & ACIDOPHILUS EX ST PO ?Take 1 capsule by mouth at bedtime. ?  ?traMADol 50 MG tablet ?Commonly known as: ULTRAM ?Take 1 tablet (50 mg total) by mouth every 8 (eight) hours as needed (mild pain). ?  ? ?  ? ? ? ?Objective:  ? ?BP (!) 142/79   Pulse 94   Temp 97.9 ?F (36.6 ?C) (Temporal)   Resp 20   Ht 5' 1"  (1.549 m)   Wt 157 lb (71.2 kg)   SpO2 93%   BMI 29.66 kg/m?   ?Wt Readings from Last 3 Encounters:  ?05/15/21 157 lb (71.2 kg)  ?02/06/21 157 lb (71.2 kg)  ?01/23/21 152 lb 12.8 oz (69.3 kg)  ?  ?Physical Exam ?Vitals and nursing note reviewed.  ?Constitutional:   ?   General: She is not in acute distress. ?   Appearance:  She is well-developed. She is not diaphoretic.  ?Eyes:  ?   Conjunctiva/sclera: Conjunctivae normal.  ?Cardiovascular:  ?   Rate and Rhythm: Normal rate and regular rhythm.  ?   Heart sounds: Normal heart sounds. No murmur heard. ?Pulmonary:  ?   Effort: Pulmonary effort is normal. No respiratory distress.  ?   Breath sounds: Normal breath sounds. No wheezing.  ?Musculoskeletal:     ?   General: Swelling (2+ pitting edema bilateral lower extremities) present. No tenderness. Normal range of motion.  ?Skin: ?   General: Skin is warm and dry.  ?   Findings: No rash.  ?Neurological:  ?   Mental Status: She is alert and oriented to person, place, and  time.  ?   Coordination: Coordination normal.  ?Psychiatric:     ?   Behavior: Behavior normal.  ? ? ? ? ?Assessment & Plan:  ? ?Problem List Items Addressed This Visit   ? ?  ? Cardiovascular and Mediastinum  ? Hypertension (Chronic)  ? Relevant Medications  ? amLODipine (NORVASC) 5 MG tablet  ? lisinopril (ZESTRIL) 20 MG tablet  ?  ? Musculoskeletal and Integument  ? Degenerative disc disease, lumbar (Chronic)  ? Relevant Medications  ? meloxicam (MOBIC) 15 MG tablet  ? traMADol (ULTRAM) 50 MG tablet  ?  ? Other  ? Hyperlipemia - Primary (Chronic)  ? Relevant Medications  ? amLODipine (NORVASC) 5 MG tablet  ? lisinopril (ZESTRIL) 20 MG tablet  ? Other Relevant Orders  ? CMP14+EGFR  ? Lipid panel  ? GAD (generalized anxiety disorder) (Chronic)  ? Relevant Medications  ? DULoxetine (CYMBALTA) 60 MG capsule  ? Other Relevant Orders  ? CBC with Differential/Platelet  ? ?Other Visit Diagnoses   ? ? Chronic midline low back pain without sciatica      ? Relevant Medications  ? meloxicam (MOBIC) 15 MG tablet  ? DULoxetine (CYMBALTA) 60 MG capsule  ? traMADol (ULTRAM) 50 MG tablet  ? Gastroesophageal reflux disease without esophagitis      ? Relevant Medications  ? pantoprazole (PROTONIX) 40 MG tablet  ? Other Relevant Orders  ? CBC with Differential/Platelet  ? Essential  hypertension  (Chronic)     ? Relevant Medications  ? amLODipine (NORVASC) 5 MG tablet  ? lisinopril (ZESTRIL) 20 MG tablet  ? Other Relevant Orders  ? BMP8+EGFR  ? ?  ?  ?We will decrease her amlodipine back to 5 mg and increase her lisinopr

## 2021-05-16 LAB — CBC WITH DIFFERENTIAL/PLATELET
Basophils Absolute: 0 10*3/uL (ref 0.0–0.2)
Basos: 0 %
EOS (ABSOLUTE): 0.5 10*3/uL — ABNORMAL HIGH (ref 0.0–0.4)
Eos: 6 %
Hematocrit: 40.1 % (ref 34.0–46.6)
Hemoglobin: 13.7 g/dL (ref 11.1–15.9)
Immature Grans (Abs): 0 10*3/uL (ref 0.0–0.1)
Immature Granulocytes: 0 %
Lymphocytes Absolute: 1.8 10*3/uL (ref 0.7–3.1)
Lymphs: 20 %
MCH: 30 pg (ref 26.6–33.0)
MCHC: 34.2 g/dL (ref 31.5–35.7)
MCV: 88 fL (ref 79–97)
Monocytes Absolute: 1.1 10*3/uL — ABNORMAL HIGH (ref 0.1–0.9)
Monocytes: 12 %
Neutrophils Absolute: 5.5 10*3/uL (ref 1.4–7.0)
Neutrophils: 62 %
Platelets: 545 10*3/uL — ABNORMAL HIGH (ref 150–450)
RBC: 4.56 x10E6/uL (ref 3.77–5.28)
RDW: 13.1 % (ref 11.7–15.4)
WBC: 8.9 10*3/uL (ref 3.4–10.8)

## 2021-05-16 LAB — CMP14+EGFR
ALT: 33 IU/L — ABNORMAL HIGH (ref 0–32)
AST: 26 IU/L (ref 0–40)
Albumin/Globulin Ratio: 1.8 (ref 1.2–2.2)
Albumin: 4.6 g/dL (ref 3.8–4.8)
Alkaline Phosphatase: 143 IU/L — ABNORMAL HIGH (ref 44–121)
BUN/Creatinine Ratio: 19 (ref 12–28)
BUN: 20 mg/dL (ref 8–27)
Bilirubin Total: 0.6 mg/dL (ref 0.0–1.2)
CO2: 25 mmol/L (ref 20–29)
Calcium: 9.9 mg/dL (ref 8.7–10.3)
Chloride: 97 mmol/L (ref 96–106)
Creatinine, Ser: 1.06 mg/dL — ABNORMAL HIGH (ref 0.57–1.00)
Globulin, Total: 2.6 g/dL (ref 1.5–4.5)
Glucose: 97 mg/dL (ref 70–99)
Potassium: 5.1 mmol/L (ref 3.5–5.2)
Sodium: 138 mmol/L (ref 134–144)
Total Protein: 7.2 g/dL (ref 6.0–8.5)
eGFR: 57 mL/min/{1.73_m2} — ABNORMAL LOW (ref >59)

## 2021-05-16 LAB — LIPID PANEL
Chol/HDL Ratio: 2.9 ratio (ref 0.0–4.4)
Cholesterol, Total: 218 mg/dL — ABNORMAL HIGH (ref 100–199)
HDL: 75 mg/dL (ref >39)
LDL Chol Calc (NIH): 110 mg/dL — ABNORMAL HIGH (ref 0–99)
Triglycerides: 191 mg/dL — ABNORMAL HIGH (ref 0–149)
VLDL Cholesterol Cal: 33 mg/dL (ref 5–40)

## 2021-05-20 ENCOUNTER — Encounter: Payer: Self-pay | Admitting: Nurse Practitioner

## 2021-05-20 ENCOUNTER — Ambulatory Visit (INDEPENDENT_AMBULATORY_CARE_PROVIDER_SITE_OTHER): Payer: Medicare HMO | Admitting: Nurse Practitioner

## 2021-05-20 DIAGNOSIS — R110 Nausea: Secondary | ICD-10-CM

## 2021-05-20 DIAGNOSIS — J029 Acute pharyngitis, unspecified: Secondary | ICD-10-CM

## 2021-05-20 DIAGNOSIS — R197 Diarrhea, unspecified: Secondary | ICD-10-CM

## 2021-05-20 MED ORDER — ONDANSETRON HCL 4 MG PO TABS: 4.0000 mg | ORAL_TABLET | Freq: Three times a day (TID) | ORAL | 0 refills | Status: DC | PRN

## 2021-05-20 MED ORDER — LOPERAMIDE HCL 2 MG PO TABS: 2.0000 mg | ORAL_TABLET | Freq: Four times a day (QID) | ORAL | 0 refills | Status: AC | PRN

## 2021-05-20 MED ORDER — AMOXICILLIN-POT CLAVULANATE 875-125 MG PO TABS: 1.0000 | ORAL_TABLET | Freq: Two times a day (BID) | ORAL | 0 refills | Status: DC

## 2021-05-20 NOTE — Progress Notes (Signed)
? ?  Virtual Visit  Note ?Due to COVID-19 pandemic this visit was conducted virtually. This visit type was conducted due to national recommendations for restrictions regarding the COVID-19 Pandemic (e.g. social distancing, sheltering in place) in an effort to limit this patient's exposure and mitigate transmission in our community. All issues noted in this document were discussed and addressed.  A physical exam was not performed with this format. ? ?I connected with Maria Moss on 05/20/21 at 12:10 pm  by telephone and verified that I am speaking with the correct person using two identifiers. Maria Moss is currently located at home during visit. The provider, Ivy Lynn, NP is located in their office at time of visit. ? ?I discussed the limitations, risks, security and privacy concerns of performing an evaluation and management service by telephone and the availability of in person appointments. I also discussed with the patient that there may be a patient responsible charge related to this service. The patient expressed understanding and agreed to proceed. ? ? ?History and Present Illness: ? ?Sore Throat  ?This is a new problem. Episode onset: in the past 3-4 days. The problem has been gradually worsening. There has been no fever. The pain is moderate. Associated symptoms include diarrhea and swollen glands. Pertinent negatives include no congestion, coughing or neck pain. She has had exposure to strep. She has tried nothing for the symptoms.  ? ? ? ?Review of Systems  ?Constitutional: Negative.   ?HENT:  Positive for sore throat. Negative for congestion and sinus pain.   ?Respiratory: Negative.  Negative for cough.   ?Cardiovascular: Negative.   ?Gastrointestinal:  Positive for diarrhea and nausea.  ?Musculoskeletal:  Negative for neck pain.  ?Skin: Negative.  Negative for rash.  ?All other systems reviewed and are negative. ? ? ?Observations/Objective: ?Tele-phone visit patient not in distress ? ?Assessment  and Plan: ?3 family members with positive strep,  ?Patient presents with sore throat nausea and vomiting  ?Take meds as prescribed ?- Use a cool mist humidifier  ?-Use saline nose sprays frequently ?-Force fluids ?-Zofran for nausea ?-imodium for diarrhea ?-For fever or aches or pains- take Tylenol or ibuprofen. ?-If symptoms do not improve, she may need to be COVID tested to rule this out ?Follow up with worsening unresolved symptoms  ? ?Follow Up Instructions: ?Follow up with unresolved symptoms ? ?  ?I discussed the assessment and treatment plan with the patient. The patient was provided an opportunity to ask questions and all were answered. The patient agreed with the plan and demonstrated an understanding of the instructions. ?  ?The patient was advised to call back or seek an in-person evaluation if the symptoms worsen or if the condition fails to improve as anticipated. ? ?The above assessment and management plan was discussed with the patient. The patient verbalized understanding of and has agreed to the management plan. Patient is aware to call the clinic if symptoms persist or worsen. Patient is aware when to return to the clinic for a follow-up visit. Patient educated on when it is appropriate to go to the emergency department.  ? ?Time call ended:  12:22 pm  ? ?I provided 12 minutes of  non face-to-face time during this encounter. ? ? ? ?Ivy Lynn, NP ?  ?

## 2021-06-04 ENCOUNTER — Other Ambulatory Visit (HOSPITAL_COMMUNITY): Payer: Medicare HMO

## 2021-06-10 DIAGNOSIS — K6389 Other specified diseases of intestine: Secondary | ICD-10-CM | POA: Diagnosis not present

## 2021-06-10 DIAGNOSIS — K648 Other hemorrhoids: Secondary | ICD-10-CM | POA: Diagnosis not present

## 2021-06-10 DIAGNOSIS — K5730 Diverticulosis of large intestine without perforation or abscess without bleeding: Secondary | ICD-10-CM | POA: Diagnosis not present

## 2021-06-10 DIAGNOSIS — Z1211 Encounter for screening for malignant neoplasm of colon: Secondary | ICD-10-CM | POA: Diagnosis not present

## 2021-06-10 DIAGNOSIS — R195 Other fecal abnormalities: Secondary | ICD-10-CM | POA: Diagnosis not present

## 2021-06-11 ENCOUNTER — Other Ambulatory Visit: Payer: Medicare HMO

## 2021-06-11 ENCOUNTER — Other Ambulatory Visit: Payer: Self-pay

## 2021-06-11 ENCOUNTER — Ambulatory Visit (HOSPITAL_COMMUNITY): Payer: Medicare HMO | Admitting: Physician Assistant

## 2021-06-11 DIAGNOSIS — D75839 Thrombocytosis, unspecified: Secondary | ICD-10-CM

## 2021-06-11 DIAGNOSIS — I10 Essential (primary) hypertension: Secondary | ICD-10-CM

## 2021-06-11 DIAGNOSIS — D6489 Other specified anemias: Secondary | ICD-10-CM | POA: Diagnosis not present

## 2021-06-11 DIAGNOSIS — D638 Anemia in other chronic diseases classified elsewhere: Secondary | ICD-10-CM | POA: Diagnosis not present

## 2021-06-11 DIAGNOSIS — D509 Iron deficiency anemia, unspecified: Secondary | ICD-10-CM

## 2021-06-11 DIAGNOSIS — E538 Deficiency of other specified B group vitamins: Secondary | ICD-10-CM

## 2021-06-11 NOTE — Addendum Note (Signed)
Addended by: Memory Argue on: 06/11/2021 11:02 AM   Modules accepted: Orders

## 2021-06-11 NOTE — Addendum Note (Signed)
Addended by: Memory Argue on: 06/11/2021 11:01 AM   Modules accepted: Orders

## 2021-06-12 LAB — BMP8+EGFR
BUN/Creatinine Ratio: 19 (ref 12–28)
BUN: 23 mg/dL (ref 8–27)
CO2: 21 mmol/L (ref 20–29)
Calcium: 9.7 mg/dL (ref 8.7–10.3)
Chloride: 101 mmol/L (ref 96–106)
Creatinine, Ser: 1.18 mg/dL — ABNORMAL HIGH (ref 0.57–1.00)
Glucose: 100 mg/dL — ABNORMAL HIGH (ref 70–99)
Potassium: 5.1 mmol/L (ref 3.5–5.2)
Sodium: 139 mmol/L (ref 134–144)
eGFR: 50 mL/min/{1.73_m2} — ABNORMAL LOW (ref >59)

## 2021-06-13 LAB — IRON AND TIBC
Iron Saturation: 25 % (ref 15–55)
Iron: 70 ug/dL (ref 27–139)
Total Iron Binding Capacity: 285 ug/dL (ref 250–450)
UIBC: 215 ug/dL (ref 118–369)

## 2021-06-13 LAB — COMPREHENSIVE METABOLIC PANEL
ALT: 22 IU/L (ref 0–32)
AST: 22 IU/L (ref 0–40)
Albumin/Globulin Ratio: 2.2 (ref 1.2–2.2)
Albumin: 4.7 g/dL (ref 3.8–4.8)
Alkaline Phosphatase: 135 IU/L — ABNORMAL HIGH (ref 44–121)
BUN/Creatinine Ratio: 20 (ref 12–28)
BUN: 25 mg/dL (ref 8–27)
Bilirubin Total: 0.4 mg/dL (ref 0.0–1.2)
CO2: 20 mmol/L (ref 20–29)
Calcium: 9.7 mg/dL (ref 8.7–10.3)
Chloride: 100 mmol/L (ref 96–106)
Creatinine, Ser: 1.23 mg/dL — ABNORMAL HIGH (ref 0.57–1.00)
Globulin, Total: 2.1 g/dL (ref 1.5–4.5)
Glucose: 100 mg/dL — ABNORMAL HIGH (ref 70–99)
Potassium: 5.2 mmol/L (ref 3.5–5.2)
Sodium: 138 mmol/L (ref 134–144)
Total Protein: 6.8 g/dL (ref 6.0–8.5)
eGFR: 48 mL/min/{1.73_m2} — ABNORMAL LOW (ref >59)

## 2021-06-13 LAB — CBC WITH DIFFERENTIAL/PLATELET
Basophils Absolute: 0 10*3/uL (ref 0.0–0.2)
Basos: 0 %
EOS (ABSOLUTE): 0.6 10*3/uL — ABNORMAL HIGH (ref 0.0–0.4)
Eos: 6 %
Hematocrit: 37.3 % (ref 34.0–46.6)
Hemoglobin: 12.5 g/dL (ref 11.1–15.9)
Immature Grans (Abs): 0 10*3/uL (ref 0.0–0.1)
Immature Granulocytes: 0 %
Lymphocytes Absolute: 1.8 10*3/uL (ref 0.7–3.1)
Lymphs: 21 %
MCH: 29.4 pg (ref 26.6–33.0)
MCHC: 33.5 g/dL (ref 31.5–35.7)
MCV: 88 fL (ref 79–97)
Monocytes Absolute: 1 10*3/uL — ABNORMAL HIGH (ref 0.1–0.9)
Monocytes: 11 %
Neutrophils Absolute: 5.5 10*3/uL (ref 1.4–7.0)
Neutrophils: 62 %
Platelets: 519 10*3/uL — ABNORMAL HIGH (ref 150–450)
RBC: 4.25 x10E6/uL (ref 3.77–5.28)
RDW: 13.1 % (ref 11.7–15.4)
WBC: 9 10*3/uL (ref 3.4–10.8)

## 2021-06-13 LAB — VITAMIN B12: Vitamin B-12: 625 pg/mL (ref 232–1245)

## 2021-06-13 LAB — C-REACTIVE PROTEIN: CRP: 8 mg/L (ref 0–10)

## 2021-06-13 LAB — METHYLMALONIC ACID, SERUM: Methylmalonic Acid: 280 nmol/L (ref 0–378)

## 2021-06-13 LAB — FERRITIN: Ferritin: 457 ng/mL — ABNORMAL HIGH (ref 15–150)

## 2021-06-13 LAB — SEDIMENTATION RATE: Sed Rate: 46 mm/hr — ABNORMAL HIGH (ref 0–40)

## 2021-06-13 NOTE — Progress Notes (Unsigned)
[place], [place] [address]   CLINIC:  Medical Oncology/Hematology  PCP:  Dettinger, Fransisca Kaufmann, MD [place] [id number] [phone]   REASON FOR VISIT:  Follow-up for normocytic anemia and thrombocytosis  CURRENT THERAPY: Intermittent iron infusions (last Feraheme on 10/26/2019 and 11/02/2019)  INTERVAL HISTORY:  Maria Moss 71 y.o. female returns for routine follow-up of her normocytic anemia and thrombocytosis.  She was last seen in office by Tarri Abernethy PA-C on 12/04/2020.  At today's visit, she reports feeling fair apart from fatigue.  No recent hospitalizations, surgeries, or changes in baseline health status.  ANEMIA: She reports occasional "dark and sticky" stools, but denies any black bowel movements.  No bright red blood per rectum. She had a positive Cologuard test on 03/11/2021.  Reports that she had a colonoscopy at [facility] on 06/10/2021 - review of results via [place] which showed internal hemorrhoids, few diverticula and nonspecific bulbous ileocecal valve which was biopsied.  I am not able to see pathology results. She has chronic fatigue which is unchanged. She denies any recent dyspnea on exertion. No chest pain, lightheadedness, or syncopal episodes.   THROMBOCYTOSIS: At her last visit, she was instructed to start taking aspirin 81 mg daily due to her thrombocytosis.  She reports that she took this for a few days, but self discontinued it due to increased bruising. She does not have any history of DVT or PE. She denies any aquagenic pruritus, nods phenomenon, or vasomotor symptoms. She does report that she has had a few episodes "over the years" where her legs will swell, itch, and turn red, which is usually relieved by extra doses of her fluid pills and an unspecified "cream" prescribed by her PCP. She denies any B symptoms such as fever, chills, night sweats, or unintentional weight loss.  She is  continuing to follow with endocrinology for her history of papillary thyroid cancer s/p partial thyroidectomy (May 2021) with new left-sided thyroid nodule which was recently found to be benign per FNA biopsy.    She has 25% energy and 75% appetite. She endorses that she is maintaining a stable weight.   REVIEW OF SYSTEMS:    Review of Systems  Constitutional:  Positive for fatigue. Negative for appetite change, chills, diaphoresis, fever and unexpected weight change.  HENT:   Negative for lump/mass and nosebleeds.   Eyes:  Negative for eye problems.  Respiratory:  Negative for cough, hemoptysis and shortness of breath.   Cardiovascular:  Positive for leg swelling. Negative for chest pain and palpitations.  Gastrointestinal:  Positive for constipation. Negative for abdominal pain, blood in stool, diarrhea, nausea and vomiting.  Genitourinary:  Negative for hematuria.   Musculoskeletal:  Positive for arthralgias and back pain.  Skin: Negative.   Neurological:  Negative for dizziness, headaches and light-headedness.  Hematological:  Does not bruise/bleed easily.  Psychiatric/Behavioral:  Positive for sleep disturbance.       PAST MEDICAL/SURGICAL HISTORY:  Past Medical History:  Diagnosis Date   Anemia    iron deficiency   Ankle fracture, left 2006   Diverticula, intestine    Mild case.   Fibromyalgia    Dr. Justine Null Dx in [place] years ago  no meds    GERD (gastroesophageal reflux disease)    History of hiatal hernia    Hyperlipidemia    Hyperparathyroidism ([place])    Hypertension    Dx age 12.    Stroke [facility])  Questionable stoke when born or polio not sure pt. has right side weakness toes right foot turns outward and toes curled up some    Past Surgical History:  Procedure Laterality Date   ANKLE SURGERY Left    Broken.  Has screws and metal plate.   CHOLECYSTECTOMY     PARATHYROIDECTOMY Right 05/29/2019   Procedure: RIGHT INFERIOR PARATHYROIDECTOMY;  Surgeon: Armandina Gemma, MD;  Location: [facility];  Service: General;  Laterality: Right;   ROBOTIC ADRENALECTOMY Right 05/31/2018   Procedure: XI ROBOTIC RIGHT ADRENALECTOMY;  Surgeon: Ralene Ok, MD;  Location: [facility];  Service: General;  Laterality: Right;   THYROID LOBECTOMY Right 05/29/2019   Procedure: RIGHT THYROID LOBECTOMY;  Surgeon: Armandina Gemma, MD;  Location: [facility];  Service: General;  Laterality: Right;     SOCIAL HISTORY:  Social History   Socioeconomic History   Marital status: Married    Spouse name: Not on file   Number of children: Not on file   Years of education: Not on file   Highest education level: Not on file  Occupational History   Not on file  Tobacco Use   Smoking status: Never   Smokeless tobacco: Never  Vaping Use   Vaping Use: Never used  Substance and Sexual Activity   Alcohol use: No   Drug use: No   Sexual activity: Not Currently  Other Topics Concern   Not on file  Social History Narrative   Not on file   Social Determinants of Health   Financial Resource Strain: Not on file  Food Insecurity: Not on file  Transportation Needs: Not on file  Physical Activity: Not on file  Stress: Not on file  Social Connections: Not on file  Intimate Partner Violence: Not on file    FAMILY HISTORY:  Family History  Problem Relation Age of Onset   Heart disease Mother        CHF   Cancer Mother        Uterine   Osteoporosis Mother    COPD Sister    Asthma Sister    Migraines Sister    Breast cancer Neg Hx     CURRENT MEDICATIONS:  Outpatient Encounter Medications as of 06/15/2021  Medication Sig Note   acetaminophen (TYLENOL) 500 MG tablet Take by mouth.    alendronate (FOSAMAX) 70 MG tablet Take 1 tablet (70 mg total) by mouth every 7 (seven) days. Take with a full glass of water on an empty stomach.    alum & mag hydroxide-simeth (MAALOX PLUS) 400-400-40 MG/5ML suspension Take by mouth.    amLODipine (NORVASC) 5 MG tablet Take 1 tablet (5 mg total) by  mouth daily.    amoxicillin-clavulanate (AUGMENTIN) 875-125 MG tablet Take 1 tablet by mouth 2 (two) times daily.    cetirizine (ZYRTEC) 10 MG tablet Take 10 mg by mouth daily.    Cholecalciferol 25 MCG (1000 UT) tablet Take 3,000 Units by mouth daily.     dicyclomine (BENTYL) 10 MG capsule Take 10 mg by mouth in the morning and at bedtime.    DULoxetine (CYMBALTA) 60 MG capsule Take 1 capsule (60 mg total) by mouth daily.    fluticasone (FLONASE) 50 MCG/ACT nasal spray USE 2 SPRAYS IN EACH NOSTRIL EVERY DAY    furosemide (LASIX) 20 MG tablet TAKE 1 TABLET EVERY DAY    lisinopril (ZESTRIL) 20 MG tablet Take 1 tablet (20 mg total) by mouth daily.    loperamide (IMODIUM A-D) 2 MG tablet Take  1 tablet (2 mg total) by mouth 4 (four) times daily as needed for diarrhea or loose stools.    loratadine (CLARITIN) 10 MG tablet Take 10 mg by mouth daily. 04/16/2019: Alternates between zyrtec and claritin   meloxicam (MOBIC) 15 MG tablet Take 1 tablet (15 mg total) by mouth daily.    Omega-3 Fatty Acids (FISH OIL) 1000 MG CAPS Take 1,000 mg by mouth daily.     ondansetron (ZOFRAN) 4 MG tablet Take 1 tablet (4 mg total) by mouth every 8 (eight) hours as needed for nausea or vomiting.    pantoprazole (PROTONIX) 40 MG tablet Take 1 tablet (40 mg total) by mouth daily.    Probiotic Product (PROBIOTIC & ACIDOPHILUS EX ST PO) Take 1 capsule by mouth at bedtime.     traMADol (ULTRAM) 50 MG tablet Take 1 tablet (50 mg total) by mouth every 8 (eight) hours as needed (mild pain).    No facility-administered encounter medications on file as of 06/15/2021.    ALLERGIES:  Allergies  Allergen Reactions   Codeine Nausea And Vomiting   Sulfa Antibiotics     Hair loss, yellowed skin   Doxycycline Hyclate Itching     PHYSICAL EXAM:    ECOG PERFORMANCE STATUS: 2 - Symptomatic, <50% confined to bed  There were no vitals filed for this visit. There were no vitals filed for this visit. Physical Exam Constitutional:       Appearance: Normal appearance. She is obese.  HENT:     Head: Normocephalic and atraumatic.     Mouth/Throat:     Mouth: Mucous membranes are moist.  Eyes:     Extraocular Movements: Extraocular movements intact.     Pupils: Pupils are equal, round, and reactive to light.  Cardiovascular:     Rate and Rhythm: Normal rate and regular rhythm.     Pulses: Normal pulses.     Heart sounds: Normal heart sounds.  Pulmonary:     Effort: Pulmonary effort is normal.     Breath sounds: Normal breath sounds.  Abdominal:     General: Bowel sounds are normal.     Palpations: Abdomen is soft.     Tenderness: There is no abdominal tenderness.  Musculoskeletal:        General: No swelling.     Right lower leg: Edema (trace) present.     Left lower leg: Edema (trace) present.  Lymphadenopathy:     Cervical: No cervical adenopathy.  Skin:    General: Skin is warm and dry.  Neurological:     General: No focal deficit present.     Mental Status: She is alert and oriented to person, place, and time.  Psychiatric:        Mood and Affect: Mood normal.        Behavior: Behavior normal.      LABORATORY DATA:  I have reviewed the labs as listed.  CBC    Component Value Date/Time   WBC 9.0 06/11/2021 1106   WBC 10.9 (H) 05/25/2019 1428   RBC 4.25 06/11/2021 1106   RBC 4.28 05/25/2019 1428   HGB 12.5 06/11/2021 1106   HCT 37.3 06/11/2021 1106   PLT 519 (H) 06/11/2021 1106   MCV 88 06/11/2021 1106   MCH 29.4 06/11/2021 1106   MCH 29.0 05/25/2019 1428   MCHC 33.5 06/11/2021 1106   MCHC 31.6 05/25/2019 1428   RDW 13.1 06/11/2021 1106   LYMPHSABS 1.8 06/11/2021 1106   EOSABS 0.6 (H) 06/11/2021 1106  BASOSABS 0.0 06/11/2021 1106      Latest Ref Rng & Units 06/11/2021   11:10 AM 06/11/2021   11:06 AM 05/15/2021   11:03 AM  CMP  Glucose 70 - 99 mg/dL 100  100  97   BUN 8 - 27 mg/dL _0 Creatinine 0.57 - 1.00 mg/dL 1.18  1.23  1.06   Sodium 134 - 144 mmol/L 139  138  138    Potassium 3.5 - 5.2 mmol/L 5.1  5.2  5.1   Chloride 96 - 106 mmol/L 101  100  97   CO2 20 - 29 mmol/L _1 Calcium 8.7 - 10.3 mg/dL 9.7  9.7  9.9   Total Protein 6.0 - 8.5 g/dL  6.8  7.2   Total Bilirubin 0.0 - 1.2 mg/dL  0.4  0.6   Alkaline Phos 44 - 121 IU/L  135  143   AST 0 - 40 IU/L  22  26   ALT 0 - 32 IU/L  22  33     DIAGNOSTIC IMAGING:  I have independently reviewed the relevant imaging and discussed with the patient.  ASSESSMENT & PLAN: 1.  Anemia of chronic disease +/- iron deficiency -This is a combination of CKD and iron deficiency state. - EGD (09/20/2019): Gastritis, gastric polyp, hiatal hernia - She had hiatal hernia repair November 2021 - Last Feraheme infusion was on 10/26/2019 and 11/02/2019 -  She reports occasional "dark and sticky" stools, but denies any black bowel movements.  No bright red blood per rectum. - She had a positive Cologuard test on 03/11/2021. - Colonoscopy at [facility] on 06/10/2021 - review of results via [place] which showed internal hemorrhoids, few diverticula and nonspecific bulbous ileocecal valve which was biopsied.  I am not able to see pathology results. - She complains of significant fatigue, which is chronic - Most recent labs (06/11/2021): Hgb 12.5, ferritin 457, iron saturation 25%.  Stable CKD stage IIIa with creatinine 1.23 and GFR 48. - PLAN: No anemia or iron deficiency at this time.  No intervention needed.  Repeat labs and RTC in 4 months  2.  Thrombocytosis: - She had thrombocytosis for the last several years. - JAK2 V617F and reflex mutation testing was negative.  BCR/ABL FISH was negative. - No history of DVT or PE. - Platelet count was previously slightly improved after IV iron infusions, so this was thought to be reactive. - However, most recent labs show persistent thrombocytosis despite adequate iron stores. - Lifelong non-smoker.  No definitive history of autoimmune or inflammatory disease, but was  previously told that she "might have lupus or fibromyalgia." - ANA and rheumatoid factor are negative (12/04/2020). - CRP normal.  ESR ranges from normal to mildly elevated at 46. - Most recent CBC (06/11/2021): Platelets 519, monocytes 1.0, eosinophils 0.6.  Otherwise normal CBC. - Aspirin 81 mg was recommended at previous office visit, patient has not been taking it due to increased bruising. - PLAN: Due to persistent thrombocytosis without definitive cause, recommend bone marrow biopsy (discussed with Dr. Delton Coombes, who agrees).  Patient has refused bone marrow biopsy for the time being, but states that she will "think about it." - Education provided on importance of taking aspirin 81 mg daily to help decrease risk of VTE in the setting of thrombocytosis.  Patient reports that she will start taking it again.  3.  Vitamin B12 deficiency: - She was previously taking B12  cyanocobalamin 1 mg daily - Most recent B12 (06/11/2021) is normal at 625 with normal methylmalonic acid. - PLAN: No need for supplementation at this time.  Recheck B12 and methylmalonic acid in 6 months.  4.  Papillary thyroid cancer, follicular variant - Resection of cancerous thyroid nodule in May 2021 - Followed by Dr. Renne Crigler, who discussed with patient that her thyroid cancer is low risk since it was smaller than 2 cm and without any signs of extension or invasion in the lymphovascular system. - Per Dr. Renne Crigler, no additional treatment was necessary, and she has been following the patient with annual ultrasounds. - New left thyroid nodule found to be benign per FNA biopsy in March 2023. - PLAN: Continue follow-up with Dr. Renne Crigler  5.  Primary hyperparathyroidism -Patient has history of hypercalcemia, which was found to be primary hypercalcemia - Calcium decreased to normal after surgery (May 2021) - PLAN: Continue follow-up with Dr. Renne Crigler   PLAN SUMMARY & DISPOSITION:   Labs in 4 months (at [facility]) - CBC, ferritin,  iron/TIBC, B12, methylmalonic acid, LDH RTC after labs  All questions were answered. The patient knows to call the clinic with any problems, questions or concerns.  Medical decision making: Moderate    Time spent on visit: I spent 20 minutes counseling the patient face to face. The total time spent in the appointment was 30 minutes and more than 50% was on counseling.   Harriett Rush, PA-C  06/15/2021 2:33 PM

## 2021-06-15 ENCOUNTER — Inpatient Hospital Stay (HOSPITAL_COMMUNITY): Payer: Medicare HMO | Attending: Physician Assistant | Admitting: Physician Assistant

## 2021-06-15 VITALS — BP 132/73 | HR 77 | Temp 98.3°F | Resp 18 | Ht 61.0 in | Wt 147.7 lb

## 2021-06-15 DIAGNOSIS — E210 Primary hyperparathyroidism: Secondary | ICD-10-CM | POA: Insufficient documentation

## 2021-06-15 DIAGNOSIS — C73 Malignant neoplasm of thyroid gland: Secondary | ICD-10-CM | POA: Diagnosis not present

## 2021-06-15 DIAGNOSIS — E538 Deficiency of other specified B group vitamins: Secondary | ICD-10-CM | POA: Insufficient documentation

## 2021-06-15 DIAGNOSIS — N189 Chronic kidney disease, unspecified: Secondary | ICD-10-CM | POA: Diagnosis not present

## 2021-06-15 DIAGNOSIS — D509 Iron deficiency anemia, unspecified: Secondary | ICD-10-CM | POA: Insufficient documentation

## 2021-06-15 DIAGNOSIS — K2970 Gastritis, unspecified, without bleeding: Secondary | ICD-10-CM | POA: Insufficient documentation

## 2021-06-15 DIAGNOSIS — D75839 Thrombocytosis, unspecified: Secondary | ICD-10-CM | POA: Insufficient documentation

## 2021-06-15 DIAGNOSIS — D631 Anemia in chronic kidney disease: Secondary | ICD-10-CM | POA: Insufficient documentation

## 2021-06-15 NOTE — Patient Instructions (Signed)
[facility] Discharge Instructions  You were seen today by   PA-C for your history of iron deficiency anemia and your elevated platelets ("thrombocytosis").  Your red blood cells/hemoglobin and your iron levels looked great today!  You do not need any IV iron at this time.    Your platelets remain elevated.   Previous testing has ruled out certain genetic mutations or certain types of blood cancer as the cause of your high platelets.   You may still have some other bone marrow abnormalities, and I would like to check a BONE MARROW BIOPSY to see if there are any more serious problems in your blood.   However, I respect your decision to decline this test for the time being.  Let me know if you decide to go through with it, otherwise, we will discuss again at your follow-up in 4 months. It is also possible that your high platelets may be a reaction ("reactive thrombocytosis") to underlying inflammation and stress in your body.   Because of your elevated platelets, you do have a higher risk of blood clots in your legs or lungs.  Start taking aspirin 81 mg daily to help decrease that risk.  MEDICATIONS: Start taking aspirin 81 mg daily.  FOLLOW-UP APPOINTMENT: Labs and office visit in 4 months   Thank you for choosing  [facility] to provide your oncology and hematology care.  To afford each patient quality time with our provider, please arrive at least 15 minutes before your scheduled appointment time.   If you have a lab appointment with the Cancer Center please come in thru the Main Entrance and check in at the main information desk.  You need to re-schedule your appointment should you arrive 10 or more minutes late.  We strive to give you quality time with our providers, and arriving late affects you and other patients whose appointments are after yours.  Also, if you no show three or more times for  appointments you may be dismissed from the clinic at the providers discretion.     Again, thank you for choosing [facility].  Our hope is that these requests will decrease the amount of time that you wait before being seen by our physicians.       _____________________________________________________________  Should you have questions after your visit to  Cancer Center, please contact our office at [phone] and follow the prompts.  Our office hours are 8:00 a.m. and 4:30 p.m. Monday - Friday.  Please note that voicemails left after 4:00 p.m. may not be returned until the following business day.  We are closed weekends and major holidays.  You do have access to a nurse 24-7, just call the main number to the clinic [phone] and do not press any options, hold on the line and a nurse will answer the phone.    For prescription refill requests, have your pharmacy contact our office and allow 72 hours.    Due to Covid, you will need to wear a mask upon entering the hospital. If you do not have a mask, a mask will be given to you at the Main Entrance upon arrival. For doctor visits, patients may have 1 support person age 18 or older with them. For treatment visits, patients can not have anyone with them due to social distancing guidelines and our immunocompromised population.    

## 2021-07-21 DIAGNOSIS — K589 Irritable bowel syndrome without diarrhea: Secondary | ICD-10-CM | POA: Diagnosis not present

## 2021-08-01 IMAGING — US US THYROID
1 series · 13 of 25 positions shown · non-contrast
Comparison: 04/25/2019

CLINICAL DATA: Right thyroid nodule by parathyroid scan

EXAM:
THYROID ULTRASOUND
TECHNIQUE: Ultrasound examination of the thyroid gland and adjacent soft
tissues was performed.

[Series 1: us thyroid · 0.04mm/px · 13 of 66 slices shown]
[im 1/66]
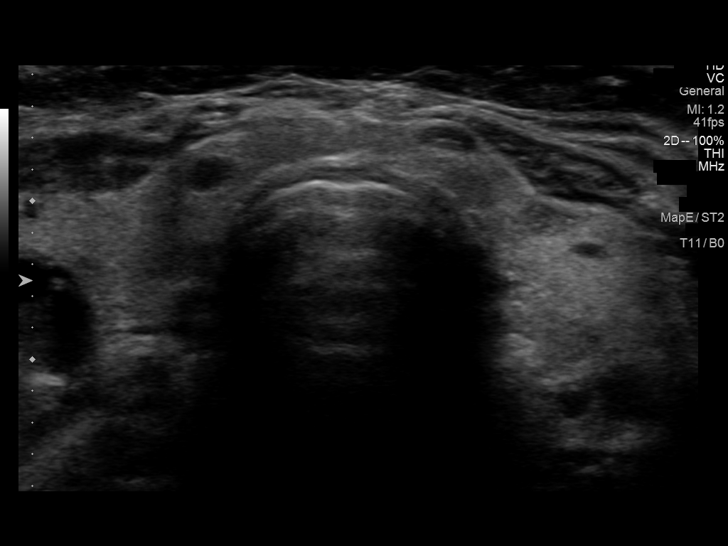
[im 6/66]
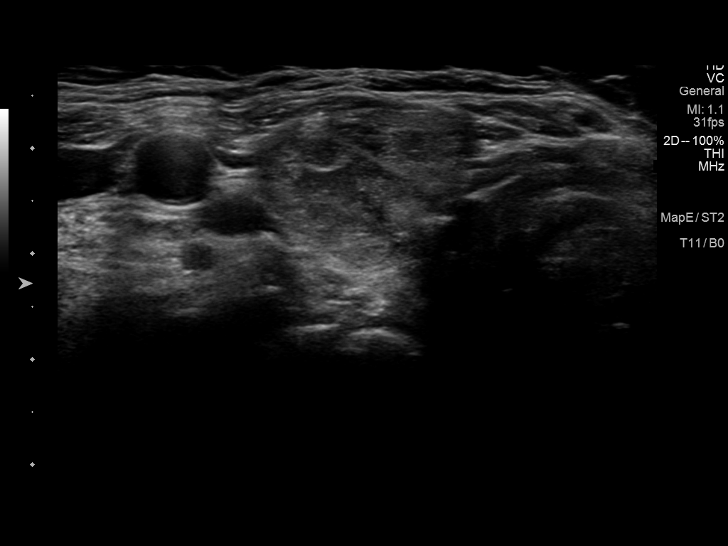
[im 11/66]
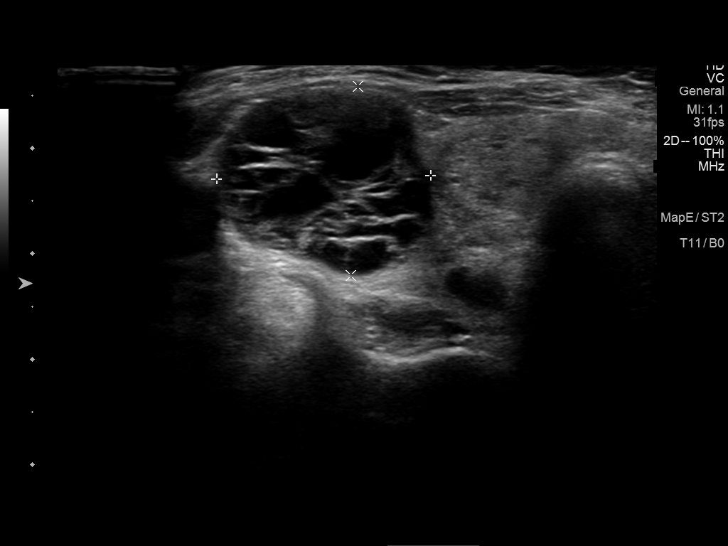
[im 17/66]
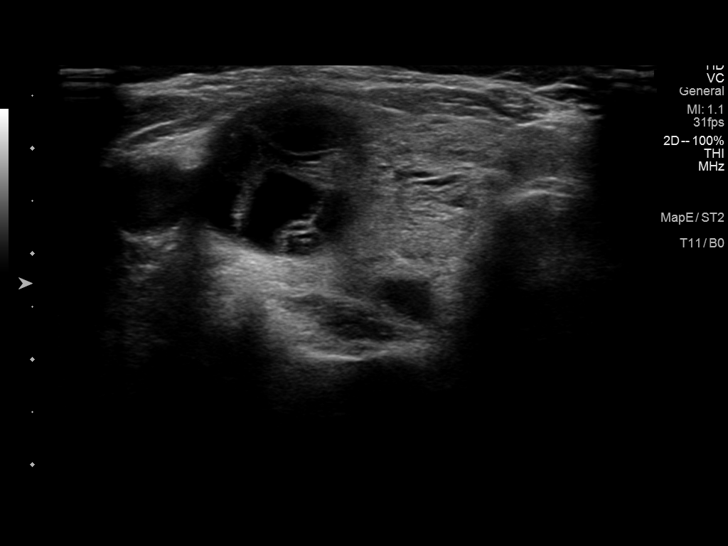
[im 22/66]
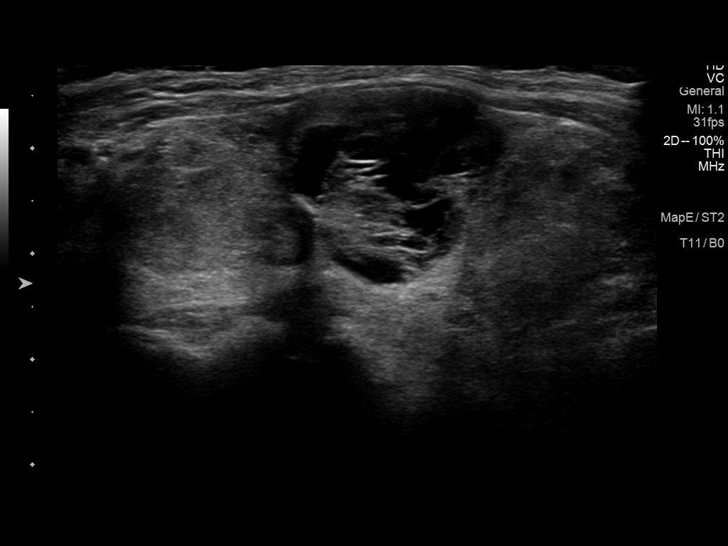
[im 28/66]
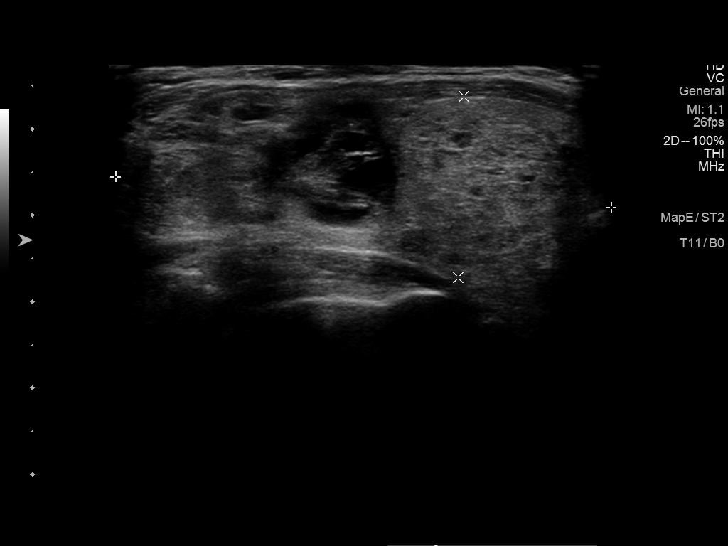
[im 33/66]
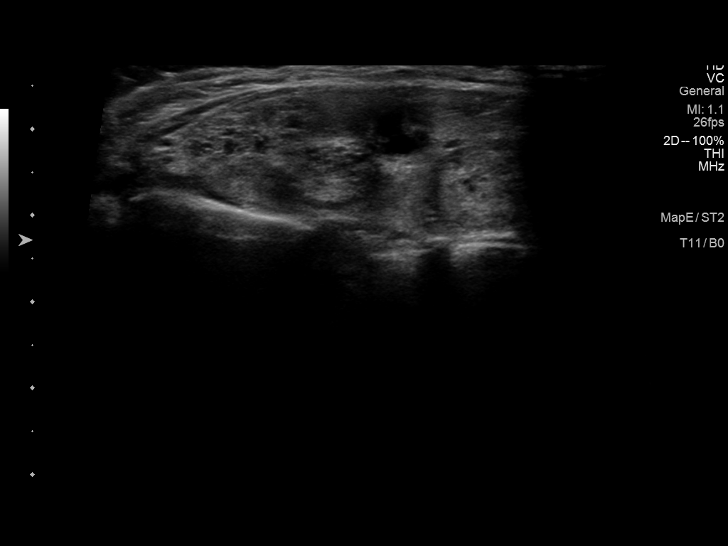
[im 38/66]
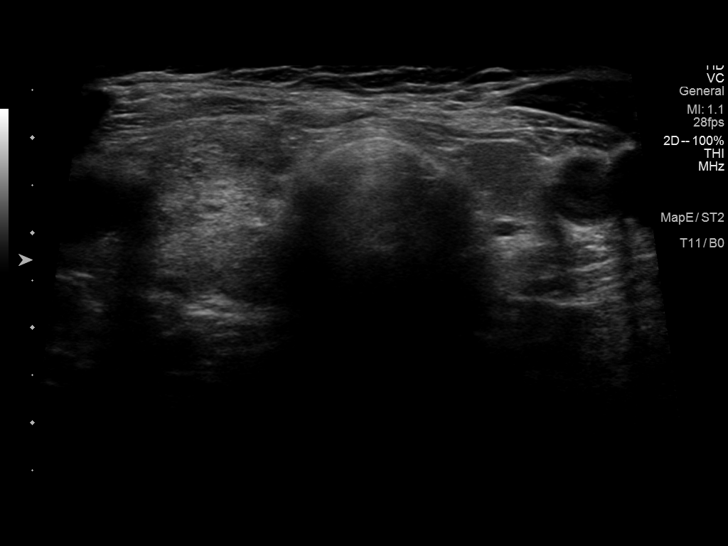
[im 44/66]
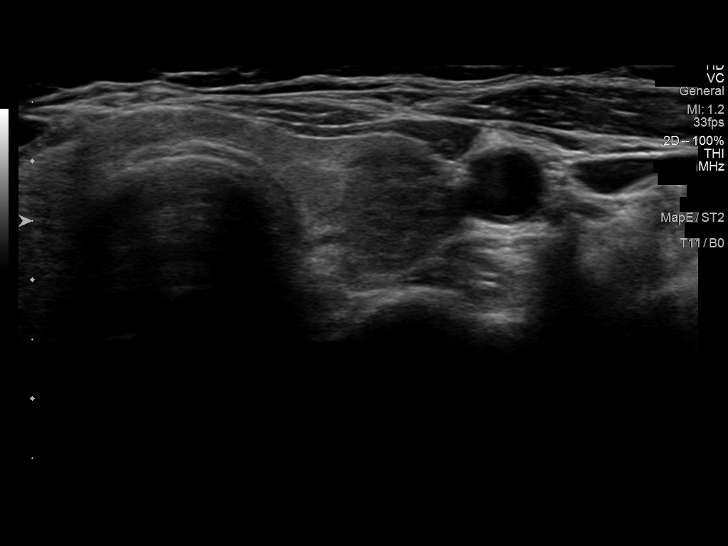
[im 49/66]
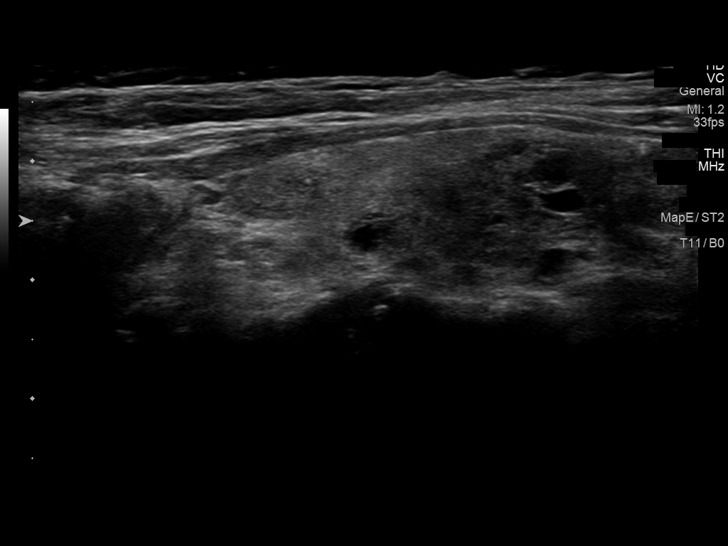
[im 55/66]
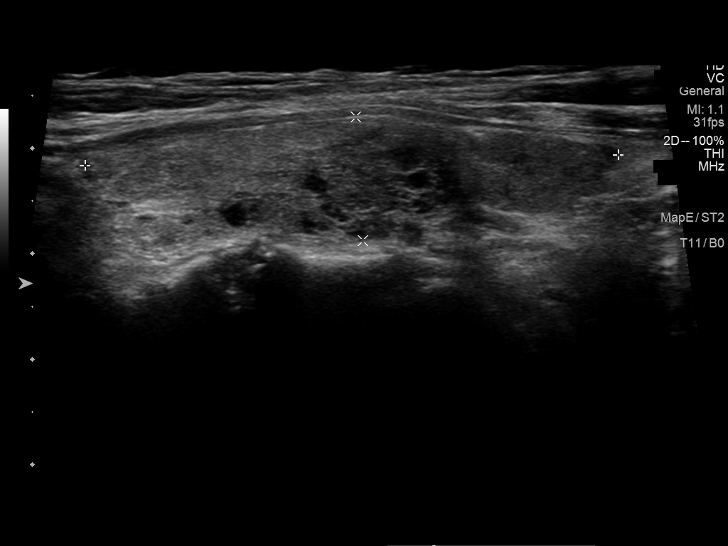
[im 60/66]
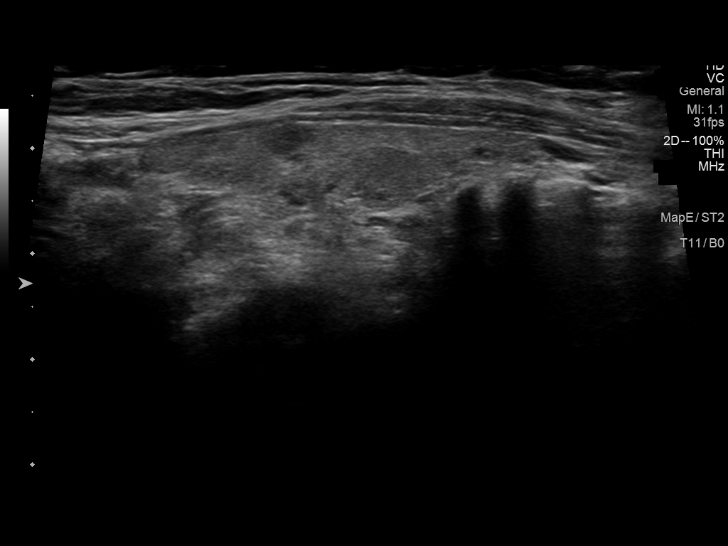
[im 66/66]
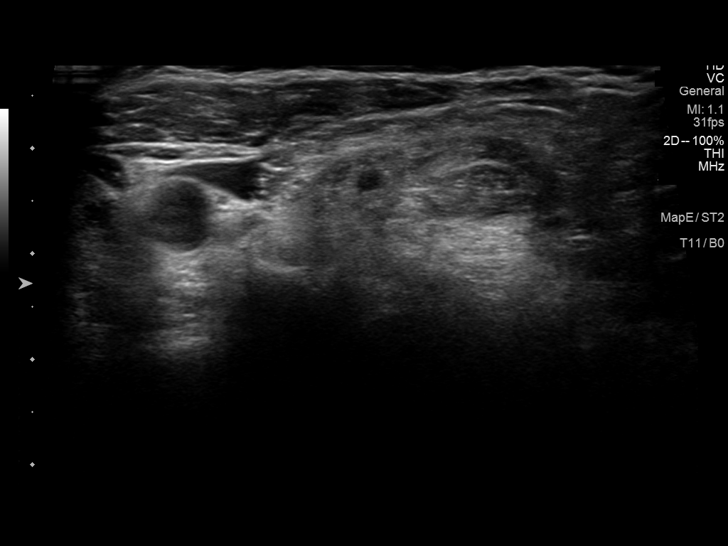

[13 of 25 positions shown; findings below may reference images not displayed]

FINDINGS: Parenchymal Echotexture: Moderately heterogenous

Isthmus: 4 mm

Right lobe: 6.1 x 2.2 x 3.1 cm

Left lobe: 5.1 x 1.2 x 1.6 cm

_________________________________________________________

Estimated total number of nodules >/= 1 cm: 1

Number of spongiform nodules >/=  2 cm not described below (TR1): 0

Number of mixed cystic and solid nodules >/= 1.5 cm not described
below (TR2): 0

_________________________________________________________

Nodule # 1:

Location: Right; Mid

Maximum size: 2.2 cm; Other 2 dimensions: 2.0 x 1.8 cm

Composition: spongiform (0)

Echogenicity: isoechoic (1)

Shape: not taller-than-wide (0)

Margins: smooth (0)

Echogenic foci: none (0)

ACR TI-RADS total points: 1.

ACR TI-RADS risk category: TR1 (0-1 points).

ACR TI-RADS recommendations:

This nodule does NOT meet TI-RADS criteria for biopsy or dedicated
follow-up.

_________________________________________________________

Moderate thyroid heterogeneity with mixed echogenicity and
background pseudo nodularity. No hypervascularity. No regional
adenopathy.
IMPRESSION: 2.2 cm right mid thyroid TR 1 spongiform nodule correlates with the
cold nodule by the parathyroid scan. This would not meet criteria
for any biopsy or follow-up.

Nonspecific thyroid heterogeneity.

The above is in keeping with the ACR TI-RADS recommendations - [HOSPITAL] 9435;[DATE].

## 2021-08-18 ENCOUNTER — Other Ambulatory Visit: Payer: Self-pay | Admitting: Internal Medicine

## 2021-08-21 ENCOUNTER — Encounter: Payer: Self-pay | Admitting: Family Medicine

## 2021-08-21 ENCOUNTER — Ambulatory Visit (INDEPENDENT_AMBULATORY_CARE_PROVIDER_SITE_OTHER): Payer: Medicare HMO | Admitting: Family Medicine

## 2021-08-21 VITALS — BP 132/79 | HR 84 | Temp 97.2°F | Ht 61.0 in | Wt 148.0 lb

## 2021-08-21 DIAGNOSIS — M5136 Other intervertebral disc degeneration, lumbar region: Secondary | ICD-10-CM | POA: Diagnosis not present

## 2021-08-21 DIAGNOSIS — I10 Essential (primary) hypertension: Secondary | ICD-10-CM | POA: Diagnosis not present

## 2021-08-21 DIAGNOSIS — E782 Mixed hyperlipidemia: Secondary | ICD-10-CM

## 2021-08-21 MED ORDER — TRAMADOL HCL 50 MG PO TABS: 50.0000 mg | ORAL_TABLET | Freq: Three times a day (TID) | ORAL | 2 refills | Status: DC | PRN

## 2021-08-21 NOTE — Progress Notes (Signed)
BP 132/79   Pulse 84   Temp (!) 97.2 F (36.2 C)   Ht '5\' 1"'$  (1.549 m)   Wt 148 lb (67.1 kg)   SpO2 100%   BMI 27.96 kg/m    Subjective:   Patient ID: Maria Moss, female    DOB: [birth year], 71 y.o.   MRN: [id number]  HPI: ABAGAEL KRAMM is a 71 y.o. female presenting on 08/21/2021 for Medical Management of Chronic Issues, Hyperlipidemia, and Hypertension   HPI Pain assessment: Cause of pain-degenerative disc disease lumbar Pain location-lower back and down legs Pain on scale of 1-10- 5 Frequency-Daily What increases pain-movements or being on her feet and arthritis and weather changes What makes pain Better-rest and medication help some Effects on ADL -does have some limited ability to walk but can do her ADLs Any change in general medical condition-none  Current opioids rx- tramadol 50 mg every 8 hours as needed # meds rx-90 Effectiveness of current meds-helped some works Adverse reactions from pain meds-none Morphine equivalent-15  Pill count performed-No Last drug screen -02/25/2021 ( high risk q46m moderate risk [id number] risk yearly ) Urine drug screen today- No Was the NCGerste[place]-yes  If yes were their any concerning findings? -None   Pain contract signed on: 02/25/21  Hyperlipidemia Patient is coming in for recheck of his hyperlipidemia. The patient is currently taking fish oils. They deny any issues with myalgias or history of liver damage from it. They deny any focal numbness or weakness or chest pain.   Hypertension Patient is currently on lisinopril and furosemide, and their blood pressure today is 132/79. Patient denies any lightheadedness or dizziness. Patient denies headaches, blurred vision, chest pains, shortness of breath, or weakness. Denies any side effects from medication and is content with current medication.   Relevant past medical, surgical, family and social history reviewed and updated as indicated. Interim medical history since our last visit  reviewed. Allergies and medications reviewed and updated.  Review of Systems  Constitutional:  Negative for chills and fever.  Eyes:  Negative for visual disturbance.  Respiratory:  Negative for chest tightness and shortness of breath.   Cardiovascular:  Negative for chest pain and leg swelling.  Musculoskeletal:  Positive for arthralgias, back pain and gait problem.  Skin:  Negative for rash.  Neurological:  Negative for light-headedness and headaches.  Psychiatric/Behavioral:  Negative for agitation and behavioral problems.   All other systems reviewed and are negative.   Per HPI unless specifically indicated above   Allergies as of 08/21/2021       Reactions   Codeine Nausea And Vomiting   Sulfa Antibiotics    Hair loss, yellowed skin   Doxycycline Hyclate Itching        Medication List        Accurate as of August 21, 2021 11:05 AM. If you have any questions, ask your nurse or doctor.          acetaminophen 500 MG tablet Commonly known as: TYLENOL Take by mouth.   alendronate 70 MG tablet Commonly known as: FOSAMAX Take 1 tablet (70 mg total) by mouth every 7 (seven) days. Take with a full glass of water on an empty stomach.   alum & mag hydroxide-simeth 40629-476-54G/5ML suspension Commonly known as: MAALOX PLUS Take by mouth.   cetirizine 10 MG tablet Commonly known as: ZYRTEC Take 10 mg by mouth daily.   Cholecalciferol 25 MCG (1000 UT) tablet Take 3,000 Units by mouth daily.  dicyclomine 10 MG capsule Commonly known as: BENTYL Take 10 mg by mouth in the morning and at bedtime.   DULoxetine 60 MG capsule Commonly known as: CYMBALTA Take 1 capsule (60 mg total) by mouth daily.   famotidine 40 MG tablet Commonly known as: PEPCID one half tablet (20 mg dose) every morning.   FIBER PO Take by mouth.   Fish Oil 1000 MG Caps Take 1,000 mg by mouth daily.   fluticasone 50 MCG/ACT nasal spray Commonly known as: FLONASE USE 2 SPRAYS IN EACH  NOSTRIL EVERY DAY   furosemide 20 MG tablet Commonly known as: LASIX TAKE 1 TABLET EVERY DAY   lisinopril 20 MG tablet Commonly known as: ZESTRIL Take 1 tablet (20 mg total) by mouth daily.   loperamide 2 MG tablet Commonly known as: Imodium A-D Take 1 tablet (2 mg total) by mouth 4 (four) times daily as needed for diarrhea or loose stools.   loratadine 10 MG tablet Commonly known as: CLARITIN Take 10 mg by mouth daily.   meloxicam 15 MG tablet Commonly known as: MOBIC Take 1 tablet (15 mg total) by mouth daily.   ondansetron 4 MG tablet Commonly known as: Zofran Take 1 tablet (4 mg total) by mouth every 8 (eight) hours as needed for nausea or vomiting.   pantoprazole 40 MG tablet Commonly known as: PROTONIX Take 1 tablet (40 mg total) by mouth daily.   PROBIOTIC & ACIDOPHILUS EX ST PO Take 1 capsule by mouth at bedtime.   traMADol 50 MG tablet Commonly known as: ULTRAM Take 1 tablet (50 mg total) by mouth every 8 (eight) hours as needed (mild pain).         Objective:   BP 132/79   Pulse 84   Temp (!) 97.2 F (36.2 C)   Ht '5\' 1"'$  (1.549 m)   Wt 148 lb (67.1 kg)   SpO2 100%   BMI 27.96 kg/m   Wt Readings from Last 3 Encounters:  08/21/21 148 lb (67.1 kg)  06/15/21 147 lb 11.3 oz (67 kg)  05/15/21 157 lb (71.2 kg)    Physical Exam Vitals and nursing note reviewed.  Constitutional:      General: She is not in acute distress.    Appearance: She is well-developed. She is not diaphoretic.  Eyes:     Conjunctiva/sclera: Conjunctivae normal.  Cardiovascular:     Rate and Rhythm: Normal rate and regular rhythm.     Heart sounds: Normal heart sounds. No murmur heard. Pulmonary:     Effort: Pulmonary effort is normal. No respiratory distress.     Breath sounds: Normal breath sounds. No wheezing.  Musculoskeletal:        General: Normal range of motion.     Lumbar back: Tenderness present. No swelling or bony tenderness. Negative right straight leg raise  test and negative left straight leg raise test.  Skin:    General: Skin is warm and dry.     Findings: No rash.  Neurological:     Mental Status: She is alert and oriented to person, place, and time.     Coordination: Coordination normal.  Psychiatric:        Behavior: Behavior normal.   Abnormal gait with outturning of the right foot    Assessment & Plan:   Problem List Items Addressed This Visit       Cardiovascular and Mediastinum   Hypertension (Chronic)     Musculoskeletal and Integument   Degenerative disc disease, lumbar (Chronic)  Relevant Medications   traMADol (ULTRAM) 50 MG tablet     Other   Hyperlipemia - Primary (Chronic)    Continue current medicine, no changes Follow up plan: Return in about 3 months (around 11/21/2021), or if symptoms worsen or fail to improve, for Pain and hypertension and cholesterol.  Counseling provided for all of the vaccine components No orders of the defined types were placed in this encounter.   Caryl Pina, MD [facility] 08/21/2021, 11:05 AM

## 2021-08-25 ENCOUNTER — Other Ambulatory Visit: Payer: Self-pay | Admitting: Physician Assistant

## 2021-08-25 DIAGNOSIS — D75839 Thrombocytosis, unspecified: Secondary | ICD-10-CM

## 2021-08-25 NOTE — Progress Notes (Signed)
Patient has agreed to bone marrow biopsy.  Will enter orders.

## 2021-09-04 DIAGNOSIS — Z9889 Other specified postprocedural states: Secondary | ICD-10-CM | POA: Diagnosis not present

## 2021-09-04 DIAGNOSIS — R1010 Upper abdominal pain, unspecified: Secondary | ICD-10-CM | POA: Diagnosis not present

## 2021-09-04 DIAGNOSIS — K5904 Chronic idiopathic constipation: Secondary | ICD-10-CM | POA: Diagnosis not present

## 2021-09-04 DIAGNOSIS — K219 Gastro-esophageal reflux disease without esophagitis: Secondary | ICD-10-CM | POA: Diagnosis not present

## 2021-09-04 DIAGNOSIS — Z8719 Personal history of other diseases of the digestive system: Secondary | ICD-10-CM | POA: Diagnosis not present

## 2021-09-10 ENCOUNTER — Other Ambulatory Visit: Payer: Self-pay | Admitting: Student

## 2021-09-10 ENCOUNTER — Other Ambulatory Visit: Payer: Self-pay | Admitting: Radiology

## 2021-09-10 DIAGNOSIS — D75839 Thrombocytosis, unspecified: Secondary | ICD-10-CM

## 2021-09-11 ENCOUNTER — Other Ambulatory Visit: Payer: Self-pay

## 2021-09-11 ENCOUNTER — Ambulatory Visit (HOSPITAL_COMMUNITY)
Admission: RE | Admit: 2021-09-11 | Discharge: 2021-09-11 | Disposition: A | Discharge status: Home / Self Care | Payer: Medicare HMO | Source: Ambulatory Visit | Attending: Physician Assistant | Admitting: Physician Assistant

## 2021-09-11 ENCOUNTER — Encounter (HOSPITAL_COMMUNITY): Payer: Self-pay

## 2021-09-11 DIAGNOSIS — K449 Diaphragmatic hernia without obstruction or gangrene: Secondary | ICD-10-CM | POA: Diagnosis not present

## 2021-09-11 DIAGNOSIS — D75839 Thrombocytosis, unspecified: Secondary | ICD-10-CM | POA: Insufficient documentation

## 2021-09-11 DIAGNOSIS — E213 Hyperparathyroidism, unspecified: Secondary | ICD-10-CM | POA: Diagnosis not present

## 2021-09-11 DIAGNOSIS — M797 Fibromyalgia: Secondary | ICD-10-CM | POA: Diagnosis not present

## 2021-09-11 DIAGNOSIS — Z1379 Encounter for other screening for genetic and chromosomal anomalies: Secondary | ICD-10-CM | POA: Diagnosis not present

## 2021-09-11 DIAGNOSIS — E785 Hyperlipidemia, unspecified: Secondary | ICD-10-CM | POA: Insufficient documentation

## 2021-09-11 DIAGNOSIS — I10 Essential (primary) hypertension: Secondary | ICD-10-CM | POA: Insufficient documentation

## 2021-09-11 DIAGNOSIS — D7589 Other specified diseases of blood and blood-forming organs: Secondary | ICD-10-CM | POA: Diagnosis not present

## 2021-09-11 DIAGNOSIS — K219 Gastro-esophageal reflux disease without esophagitis: Secondary | ICD-10-CM | POA: Diagnosis not present

## 2021-09-11 LAB — CBC WITH DIFFERENTIAL/PLATELET
Abs Immature Granulocytes: 0.03 10*3/uL (ref 0.00–0.07)
Basophils Absolute: 0 10*3/uL (ref 0.0–0.1)
Basophils Relative: 0 %
Eosinophils Absolute: 0.3 10*3/uL (ref 0.0–0.5)
Eosinophils Relative: 3 %
HCT: 37.2 % (ref 36.0–46.0)
Hemoglobin: 12.2 g/dL (ref 12.0–15.0)
Immature Granulocytes: 0 %
Lymphocytes Relative: 12 %
Lymphs Abs: 0.9 10*3/uL (ref 0.7–4.0)
MCH: 30.4 pg (ref 26.0–34.0)
MCHC: 32.8 g/dL (ref 30.0–36.0)
MCV: 92.8 fL (ref 80.0–100.0)
Monocytes Absolute: 0.8 10*3/uL (ref 0.1–1.0)
Monocytes Relative: 11 %
Neutro Abs: 5.6 10*3/uL (ref 1.7–7.7)
Neutrophils Relative %: 74 %
Platelets: 467 10*3/uL — ABNORMAL HIGH (ref 150–400)
RBC: 4.01 MIL/uL (ref 3.87–5.11)
RDW: 12.8 % (ref 11.5–15.5)
WBC: 7.6 10*3/uL (ref 4.0–10.5)
nRBC: 0 % (ref 0.0–0.2)

## 2021-09-11 MED ORDER — SODIUM CHLORIDE 0.9 % IV SOLN: INTRAVENOUS | Status: DC

## 2021-09-11 MED ORDER — SODIUM CHLORIDE 0.9 % IV SOLN
INTRAVENOUS | Status: AC
  Filled 2021-09-11: qty 500

## 2021-09-11 MED ORDER — NALOXONE HCL 0.4 MG/ML IJ SOLN
INTRAMUSCULAR | Status: AC
  Filled 2021-09-11: qty 1

## 2021-09-11 MED ORDER — FENTANYL CITRATE (PF) 100 MCG/2ML IJ SOLN
INTRAMUSCULAR | Status: AC
  Filled 2021-09-11: qty 4

## 2021-09-11 MED ORDER — FENTANYL CITRATE (PF) 100 MCG/2ML IJ SOLN
INTRAMUSCULAR | Status: AC | PRN
  Administered 2021-09-11 (×2): 25 ug via INTRAVENOUS

## 2021-09-11 MED ORDER — MIDAZOLAM HCL 2 MG/2ML IJ SOLN
INTRAMUSCULAR | Status: AC | PRN
  Administered 2021-09-11 (×2): .5 mg via INTRAVENOUS

## 2021-09-11 MED ORDER — FLUMAZENIL 0.5 MG/5ML IV SOLN
INTRAVENOUS | Status: AC
  Filled 2021-09-11: qty 5

## 2021-09-11 MED ORDER — MIDAZOLAM HCL 2 MG/2ML IJ SOLN
INTRAMUSCULAR | Status: AC
  Filled 2021-09-11: qty 4

## 2021-09-11 NOTE — Procedures (Signed)
Interventional Radiology Procedure Note  Procedure: CT guided aspirate and core biopsy of right iliac bone Complications: None Recommendations: - Bedrest supine x 1 hrs - Hydrocodone PRN  Pain - Follow biopsy results  Signed,  Dewey Neukam K. Yarisbel Miranda, MD   

## 2021-09-11 NOTE — Consult Note (Signed)
Chief Complaint: Patient was seen in consultation today for CT-guided bone marrow biopsy  Referring Physician(s): [place]  Supervising Physician: Jacqulynn Cadet  Patient Status: [facility] - Out-pt  History of Present Illness: Maria Moss is a 71 y.o. female with past medical history of fibromyalgia, GERD, hiatal hernia, hyperlipidemia, hyperparathyroidism, hypertension ,normocytic anemia, vitamin B12 deficiency, and thyroid cancer who presents now with persistent thrombocytosis of uncertain etiology.  She is scheduled today for CT-guided bone marrow biopsy for further evaluation.  Past Medical History:  Diagnosis Date   Anemia    iron deficiency   Ankle fracture, left 2006   Diverticula, intestine    Mild case.   Fibromyalgia    Dr. Justine Null Dx in [place] years ago  no meds    GERD (gastroesophageal reflux disease)    History of hiatal hernia    Hyperlipidemia    Hyperparathyroidism ([place])    Hypertension    Dx age 74.    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 has right side weakness toes right foot turns outward and toes curled up some     Past Surgical History:  Procedure Laterality Date   ANKLE SURGERY Left    Broken.  Has screws and metal plate.   CHOLECYSTECTOMY     PARATHYROIDECTOMY Right 05/29/2019   Procedure: RIGHT INFERIOR PARATHYROIDECTOMY;  Surgeon: Armandina Gemma, MD;  Location: [facility] ORS;  Service: General;  Laterality: Right;   ROBOTIC ADRENALECTOMY Right 05/31/2018   Procedure: XI ROBOTIC RIGHT ADRENALECTOMY;  Surgeon: Ralene Ok, MD;  Location: [facility] ORS;  Service: General;  Laterality: Right;   THYROID LOBECTOMY Right 05/29/2019   Procedure: RIGHT THYROID LOBECTOMY;  Surgeon: Armandina Gemma, MD;  Location: [facility] ORS;  Service: General;  Laterality: Right;    Allergies: Codeine, Sulfa antibiotics, and Doxycycline hyclate  Medications: Prior to Admission medications   Medication Sig Start Date End Date Taking? Authorizing  Provider  pantoprazole (PROTONIX) 40 MG tablet Take 1 tablet (40 mg total) by mouth daily. 05/15/21  Yes Dettinger, Fransisca Kaufmann, MD  acetaminophen (TYLENOL) 500 MG tablet Take by mouth.    [provider]  alendronate (FOSAMAX) 70 MG tablet Take 1 tablet (70 mg total) by mouth every 7 (seven) days. Take with a full glass of water on an empty stomach. 08/19/21   Philemon Kingdom, MD  alum & mag hydroxide-simeth (MAALOX PLUS) 400-400-40 MG/5ML suspension Take by mouth.    [provider]  cetirizine (ZYRTEC) 10 MG tablet Take 10 mg by mouth daily.    [provider]  Cholecalciferol 25 MCG (1000 UT) tablet Take 3,000 Units by mouth daily.     [provider]  dicyclomine (BENTYL) 10 MG capsule Take 10 mg by mouth in the morning and at bedtime. 12/12/19   [provider]  DULoxetine (CYMBALTA) 60 MG capsule Take 1 capsule (60 mg total) by mouth daily. 05/15/21   Dettinger, Fransisca Kaufmann, MD  famotidine (PEPCID) 40 MG tablet one half tablet (20 mg dose) every morning. 08/03/19   [provider]  FIBER PO Take by mouth.    [provider]  fluticasone (FLONASE) 50 MCG/ACT nasal spray USE 2 SPRAYS IN EACH NOSTRIL EVERY DAY 03/03/21   Dettinger, Fransisca Kaufmann, MD  furosemide (LASIX) 20 MG tablet TAKE 1 TABLET EVERY DAY 09/04/20   Philemon Kingdom, MD  lisinopril (ZESTRIL) 20 MG tablet Take 1 tablet (20 mg total) by mouth daily. 05/15/21   Dettinger, Fransisca Kaufmann,  MD  loperamide (IMODIUM A-D) 2 MG tablet Take 1 tablet (2 mg total) by mouth 4 (four) times daily as needed for diarrhea or loose stools. 05/20/21   Ivy Lynn, NP  loratadine (CLARITIN) 10 MG tablet Take 10 mg by mouth daily.    [provider]  meloxicam (MOBIC) 15 MG tablet Take 1 tablet (15 mg total) by mouth daily. 05/15/21   Dettinger, Fransisca Kaufmann, MD  Omega-3 Fatty Acids (FISH OIL) 1000 MG CAPS Take 1,000 mg by mouth daily.     [provider]  ondansetron (ZOFRAN) 4 MG tablet Take  1 tablet (4 mg total) by mouth every 8 (eight) hours as needed for nausea or vomiting. 05/20/21   Ivy Lynn, NP  Probiotic Product (PROBIOTIC & ACIDOPHILUS EX ST PO) Take 1 capsule by mouth at bedtime.     [provider]  traMADol (ULTRAM) 50 MG tablet Take 1 tablet (50 mg total) by mouth every 8 (eight) hours as needed (mild pain). 08/21/21   Dettinger, Fransisca Kaufmann, MD     Family History  Problem Relation Age of Onset   Heart disease Mother        CHF   Cancer Mother        Uterine   Osteoporosis Mother    COPD Sister    Asthma Sister    Migraines Sister    Breast cancer Neg Hx     Social History   Socioeconomic History   Marital status: Married    Spouse name: Not on file   Number of children: Not on file   Years of education: Not on file   Highest education level: Not on file  Occupational History   Not on file  Tobacco Use   Smoking status: Never   Smokeless tobacco: Never  Vaping Use   Vaping Use: Never used  Substance and Sexual Activity   Alcohol use: No   Drug use: No   Sexual activity: Not Currently  Other Topics Concern   Not on file  Social History Narrative   Not on file   Social Determinants of Health   Financial Resource Strain: Not on file  Food Insecurity: Not on file  Transportation Needs: Not on file  Physical Activity: Not on file  Stress: Not on file  Social Connections: Not on file      Review of Systems denies fever, headache, chest pain, dyspnea, cough, vomiting or bleeding.  She does have some mild diffuse abdominal tenderness, intermittent nausea and back pain as well as fatigue  Vital Signs: BP (!) 143/77   Pulse 75   Temp 98.2 F (36.8 C) (Oral)   Resp 18   Ht 5' 1"  (1.549 m)   Wt 147 lb 11.3 oz (67 kg)   SpO2 98%   BMI 27.91 kg/m      Physical Exam awake, alert.  Chest clear to auscultation bilaterally.  Heart with regular rate and rhythm.  Abdomen soft, positive bowel sounds, some mild diffuse tenderness  to palpation.  Trace pretibial edema bilaterally.  Imaging: No results found.  Labs:  CBC: Recent Labs    12/01/20 1536 05/15/21 1103 06/11/21 1106  WBC 9.5 8.9 9.0  HGB 13.5 13.7 12.5  HCT 40.2 40.1 37.3  PLT 611* 545* 519*    COAGS: No results for input(s): "INR", "APTT" in the last 8760 hours.  BMP: Recent Labs    12/01/20 1534 01/23/21 1125 05/15/21 1103 06/11/21 1106 06/11/21 1110  NA 138  --  138 138 139  K 5.3*  --  5.1 5.2 5.1  CL 99  --  97 100 101  CO2 24  --  25 20 21   GLUCOSE 105*  --  97 100* 100*  BUN 21  --  20 25 23   CALCIUM 9.6 9.7 9.9 9.7 9.7  CREATININE 1.17*  --  1.06* 1.23* 1.18*    LIVER FUNCTION TESTS: Recent Labs    12/01/20 1534 05/15/21 1103 06/11/21 1106  BILITOT 0.4 0.6 0.4  AST 24 26 22   ALT 24 33* 22  ALKPHOS 145* 143* 135*  PROT 6.9 7.2 6.8  ALBUMIN 4.6 4.6 4.7    TUMOR MARKERS: No results for input(s): "AFPTM", "CEA", "CA199", "CHROMGRNA" in the last 8760 hours.  Assessment and Plan: 71 y.o. female with past medical history of fibromyalgia, GERD, hiatal hernia, hyperlipidemia, hyperparathyroidism, hypertension ,normocytic anemia, vitamin B12 deficiency, and thyroid cancer who presents now with persistent thrombocytosis of uncertain etiology.  She is scheduled today for CT-guided bone marrow biopsy for further evaluation.Risks and benefits of procedure was discussed with the patient  including, but not limited to bleeding, infection, damage to adjacent structures or low yield requiring additional tests.  All of the questions were answered and there is agreement to proceed.  Consent signed and in chart.    Thank you for this interesting consult.  I greatly enjoyed meeting Maria Moss and look forward to participating in their care.  A copy of this report was sent to the requesting provider on this date.  Electronically Signed: D. Rowe Robert, PA-C 09/11/2021, 8:08 AM   I spent a total of   20 minutes  in face to face  in clinical consultation, greater than 50% of which was counseling/coordinating care for CT-guided bone marrow biopsy

## 2021-09-11 NOTE — Discharge Instructions (Signed)
Please call Interventional Radiology clinic [phone] with any questions or concerns.  You may remove your dressing and shower tomorrow.    Bone Marrow Aspiration and Bone Marrow Biopsy, Adult, Care After This sheet gives you information about how to care for yourself after your procedure. Your health care provider may also give you more specific instructions. If you have problems or questions, contact your health careprovider. What can I expect after the procedure? After the procedure, it is common to have: Mild pain and tenderness. Swelling. Bruising. Follow these instructions at home: Puncture site care Follow instructions from your health care provider about how to take care of the puncture site. Make sure you: Wash your hands with soap and water before and after you change your bandage (dressing). If soap and water are not available, use hand sanitizer. Change your dressing as told by your health care provider. Check your puncture site every day for signs of infection. Check for: More redness, swelling, or pain. Fluid or blood. Warmth. Pus or a bad smell.  Activity Return to your normal activities as told by your health care provider. Ask your health care provider what activities are safe for you. Do not lift anything that is heavier than 10 lb (4.5 kg), or the limit that you are told, until your health care provider says that it is safe. Do not drive for 24 hours if you were given a sedative during your procedure. General instructions Take over-the-counter and prescription medicines only as told by your health care provider. Do not take baths, swim, or use a hot tub until your health care provider approves. Ask your health care provider if you may take showers. You may only be allowed to take sponge baths. If directed, put ice on the affected area. To do this: Put ice in a plastic bag. Place a towel between your skin and the bag. Leave the ice on for 20 minutes, 2-3 times a  day. Keep all follow-up visits as told by your health care provider. This is important.  Contact a health care provider if: Your pain is not controlled with medicine. You have a fever. You have more redness, swelling, or pain around the puncture site. You have fluid or blood coming from the puncture site. Your puncture site feels warm to the touch. You have pus or a bad smell coming from the puncture site. Summary After the procedure, it is common to have mild pain, tenderness, swelling, and bruising. Follow instructions from your health care provider about how to take care of the puncture site and what activities are safe for you. Take over-the-counter and prescription medicines only as told by your health care provider. Contact a health care provider if you have any signs of infection, such as fluid or blood coming from the puncture site. This information is not intended to replace advice given to you by your health care provider. Make sure you discuss any questions you have with your healthcare provider. Document Revised: 05/09/2018 Document Reviewed: 05/09/2018 Elsevier Patient Education  2022 [employer] Inc[employer]    Moderate Conscious Sedation, Adult, Care After This sheet gives you information about how to care for yourself after your procedure. Your health care provider may also give you more specific instructions. If you have problems or questions, contact your health careprovider. What can I expect after the procedure? After the procedure, it is common to have: Sleepiness for several hours. Impaired judgment for several hours. Difficulty with balance. Vomiting if you eat too soon. Follow these   at home: For the time period you were told by your health care provider: Rest. Do not participate in activities where you could fall or become injured. Do not drive or use machinery. Do not drink alcohol. Do not take sleeping pills or medicines that cause drowsiness. Do not  make important decisions or sign legal documents. Do not take care of children on your own. Eating and drinking  Follow the diet recommended by your health care provider. Drink enough fluid to keep your urine pale yellow. If you vomit: Drink water, juice, or soup when you can drink without vomiting. Make sure you have little or no nausea before eating solid foods.  General instructions Take over-the-counter and prescription medicines only as told by your health care provider. Have a responsible adult stay with you for the time you are told. It is important to have someone help care for you until you are awake and alert. Do not smoke. Keep all follow-up visits as told by your health care provider. This is important. Contact a health care provider if: You are still sleepy or having trouble with balance after 24 hours. You feel light-headed. You keep feeling nauseous or you keep vomiting. You develop a rash. You have a fever. You have redness or swelling around the IV site. Get help right away if: You have trouble breathing. You have new-onset confusion at home. Summary After the procedure, it is common to feel sleepy, have impaired judgment, or feel nauseous if you eat too soon. Rest after you get home. Know the things you should not do after the procedure. Follow the diet recommended by your health care provider and drink enough fluid to keep your urine pale yellow. Get help right away if you have trouble breathing or new-onset confusion at home. This information is not intended to replace advice given to you by your health care provider. Make sure you discuss any questions you have with your healthcare provider. Document Revised: 04/20/2019 Document Reviewed: 11/16/2018 Elsevier Patient Education  2022 [employer].

## 2021-09-11 NOTE — Progress Notes (Signed)
Call to patient's designated contact - Husband - Sonia Side. Discussed timeframes for pre/intra and post procedure. Verbalized understanding. Aware that call will be made post procedure to discuss pick up time and any further discharge instructions.

## 2021-09-16 LAB — SURGICAL PATHOLOGY

## 2021-09-18 ENCOUNTER — Encounter (HOSPITAL_COMMUNITY): Payer: Self-pay | Admitting: Physician Assistant

## 2021-09-18 ENCOUNTER — Telehealth: Payer: Self-pay | Admitting: Physician Assistant

## 2021-09-18 DIAGNOSIS — D75839 Thrombocytosis, unspecified: Secondary | ICD-10-CM

## 2021-09-18 NOTE — Telephone Encounter (Signed)
I called [name] to discuss results of her bone marrow biopsy, which overall were indeterminate. Per pathologist recommendation, we will recheck BCR/ABL FISH as well as NGS myeloid panel. We will have scheduling call patient next week to schedule her for these labs, and will discuss results at her upcoming appoint with me on 10/19/2021. Patient's questions and concerns were answered to her satisfaction.    PA-C 09/18/2021 5:41 PM 

## 2021-09-21 ENCOUNTER — Encounter (HOSPITAL_COMMUNITY): Payer: Self-pay

## 2021-09-22 ENCOUNTER — Inpatient Hospital Stay: Payer: Medicare HMO | Attending: Hematology | Admitting: Hematology

## 2021-09-22 DIAGNOSIS — K2970 Gastritis, unspecified, without bleeding: Secondary | ICD-10-CM | POA: Diagnosis not present

## 2021-09-22 DIAGNOSIS — E210 Primary hyperparathyroidism: Secondary | ICD-10-CM | POA: Diagnosis not present

## 2021-09-22 DIAGNOSIS — N189 Chronic kidney disease, unspecified: Secondary | ICD-10-CM | POA: Insufficient documentation

## 2021-09-22 DIAGNOSIS — E538 Deficiency of other specified B group vitamins: Secondary | ICD-10-CM | POA: Insufficient documentation

## 2021-09-22 DIAGNOSIS — D631 Anemia in chronic kidney disease: Secondary | ICD-10-CM | POA: Diagnosis not present

## 2021-09-22 DIAGNOSIS — Z79899 Other long term (current) drug therapy: Secondary | ICD-10-CM | POA: Diagnosis not present

## 2021-09-22 DIAGNOSIS — D75839 Thrombocytosis, unspecified: Secondary | ICD-10-CM | POA: Insufficient documentation

## 2021-09-22 DIAGNOSIS — C73 Malignant neoplasm of thyroid gland: Secondary | ICD-10-CM | POA: Diagnosis not present

## 2021-09-23 ENCOUNTER — Other Ambulatory Visit: Payer: Self-pay | Admitting: Internal Medicine

## 2021-09-23 ENCOUNTER — Encounter (HOSPITAL_COMMUNITY): Payer: Self-pay

## 2021-09-25 LAB — BCR-ABL1 FISH
Cells Analyzed: 200
Cells Counted: 200

## 2021-09-28 ENCOUNTER — Encounter (HOSPITAL_COMMUNITY): Payer: Self-pay | Admitting: Physician Assistant

## 2021-10-01 ENCOUNTER — Telehealth: Payer: Self-pay | Admitting: Family Medicine

## 2021-10-01 ENCOUNTER — Encounter (HOSPITAL_COMMUNITY): Payer: Self-pay | Admitting: Physician Assistant

## 2021-10-01 NOTE — Telephone Encounter (Signed)
Pt is not sure when the colonoscopy was but symptoms started after procedure.  She has no bleeding but does have pain when she uses the restroom.  Pt states that she takes probiotic, fiber and uses Miralax prn.  Advised pt to call GI office. Pt understood.

## 2021-10-07 LAB — INTELLIGEN MYELOID

## 2021-10-12 ENCOUNTER — Other Ambulatory Visit: Payer: Medicare HMO

## 2021-10-12 DIAGNOSIS — E538 Deficiency of other specified B group vitamins: Secondary | ICD-10-CM

## 2021-10-12 DIAGNOSIS — D75839 Thrombocytosis, unspecified: Secondary | ICD-10-CM

## 2021-10-12 DIAGNOSIS — D509 Iron deficiency anemia, unspecified: Secondary | ICD-10-CM | POA: Diagnosis not present

## 2021-10-14 LAB — CBC WITH DIFFERENTIAL/PLATELET
Basophils Absolute: 0 10*3/uL (ref 0.0–0.2)
Basos: 0 %
EOS (ABSOLUTE): 0.3 10*3/uL (ref 0.0–0.4)
Eos: 3 %
Hematocrit: 35.1 % (ref 34.0–46.6)
Hemoglobin: 11.7 g/dL (ref 11.1–15.9)
Immature Grans (Abs): 0 10*3/uL (ref 0.0–0.1)
Immature Granulocytes: 1 %
Lymphocytes Absolute: 2 10*3/uL (ref 0.7–3.1)
Lymphs: 23 %
MCH: 29.7 pg (ref 26.6–33.0)
MCHC: 33.3 g/dL (ref 31.5–35.7)
MCV: 89 fL (ref 79–97)
Monocytes Absolute: 0.8 10*3/uL (ref 0.1–0.9)
Monocytes: 9 %
Neutrophils Absolute: 5.6 10*3/uL (ref 1.4–7.0)
Neutrophils: 64 %
Platelets: 539 10*3/uL — ABNORMAL HIGH (ref 150–450)
RBC: 3.94 x10E6/uL (ref 3.77–5.28)
RDW: 12.7 % (ref 11.7–15.4)
WBC: 8.7 10*3/uL (ref 3.4–10.8)

## 2021-10-14 LAB — METHYLMALONIC ACID, SERUM: Methylmalonic Acid: 356 nmol/L (ref 0–378)

## 2021-10-14 LAB — VITAMIN B12: Vitamin B-12: 663 pg/mL (ref 232–1245)

## 2021-10-14 LAB — IRON AND TIBC
Iron Saturation: 34 % (ref 15–55)
Iron: 86 ug/dL (ref 27–139)
Total Iron Binding Capacity: 255 ug/dL (ref 250–450)
UIBC: 169 ug/dL (ref 118–369)

## 2021-10-14 LAB — LACTATE DEHYDROGENASE: LDH: 175 IU/L (ref 119–226)

## 2021-10-14 LAB — FERRITIN: Ferritin: 440 ng/mL — ABNORMAL HIGH (ref 15–150)

## 2021-10-17 NOTE — Progress Notes (Unsigned)
[facility] [address] [place] [address]   CLINIC:  Medical Oncology/Hematology  PCP:  Dettinger, Fransisca Kaufmann, MD [place] [address] [phone]   REASON FOR VISIT:  Follow-up for normocytic anemia and thrombocytosis  CURRENT THERAPY: Intermittent iron infusions (last Feraheme on 10/26/2019 and 11/02/2019)  INTERVAL HISTORY:  Maria Moss 71 y.o. female returns for routine follow-up of her normocytic anemia and thrombocytosis.  She was last seen in office by Tarri Abernethy PA-C on 06/15/2021.  At today's visit, she reports feeling fair apart from fatigue.  No recent hospitalizations, surgeries, or changes in baseline health status. ***   ANEMIA: She reports occasional "dark and sticky" stools, but denies any black bowel movements. *** *** No bright red blood per rectum. *** She has chronic fatigue which is unchanged. *** She denies any recent dyspnea on exertion. *** No chest pain, lightheadedness, or syncopal episodes.   THROMBOCYTOSIS: She is taking aspirin 81 mg daily, but reports that it causes increased bruising.  *** (Had previously stopped taking it for this reason??)  *** *** She does not have any history of DVT or PE. *** She denies any aquagenic pruritus, Raynaud's phenomenon, or vasomotor symptoms. *** She does report that she has had a few episodes "over the years" where her legs will swell, itch, and turn red, which is usually relieved by extra doses of her fluid pills and an unspecified "cream" prescribed by her PCP. *** She denies any B symptoms such as fever, chills, night sweats, or unintentional weight loss.  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   ***   She has ***% energy and ***% appetite. She endorses that she is maintaining a stable  weight.   REVIEW OF SYSTEMS:   *** Review of Systems  Constitutional:  Positive for fatigue. Negative for appetite change, chills, diaphoresis, fever and unexpected weight change.  HENT:   Negative for lump/mass and nosebleeds.   Eyes:  Negative for eye problems.  Respiratory:  Negative for cough, hemoptysis and shortness of breath.   Cardiovascular:  Positive for leg swelling. Negative for chest pain and palpitations.  Gastrointestinal:  Positive for constipation. Negative for abdominal pain, blood in stool, diarrhea, nausea and vomiting.  Genitourinary:  Negative for hematuria.   Musculoskeletal:  Positive for arthralgias and back pain.  Skin: Negative.   Neurological:  Negative for dizziness, headaches and light-headedness.  Hematological:  Does not bruise/bleed easily.  Psychiatric/Behavioral:  Positive for sleep disturbance.       PAST MEDICAL/SURGICAL HISTORY:  Past Medical History:  Diagnosis Date   Anemia    iron deficiency   Ankle fracture, left 2006   Diverticula, intestine    Mild case.   Fibromyalgia    Dr. Justine Null Dx in [place] years ago  no meds    GERD (gastroesophageal reflux disease)    History of hiatal hernia    Hyperlipidemia    Hyperparathyroidism ([place])    Hypertension    Dx age 29.    Stroke [facility])    Questionable stoke when born or polio not sure pt. has right side weakness toes right foot turns outward and toes curled up some    Past Surgical History:  Procedure Laterality Date   ANKLE SURGERY Left    Broken.  Has screws and metal plate.  CHOLECYSTECTOMY     PARATHYROIDECTOMY Right 05/29/2019   Procedure: RIGHT INFERIOR PARATHYROIDECTOMY;  Surgeon: Armandina Gemma, MD;  Location: [facility];  Service: General;  Laterality: Right;   ROBOTIC ADRENALECTOMY Right 05/31/2018   Procedure: XI ROBOTIC RIGHT ADRENALECTOMY;  Surgeon: Ralene Ok, MD;  Location: [facility];  Service: General;  Laterality: Right;   THYROID LOBECTOMY Right 05/29/2019   Procedure:  RIGHT THYROID LOBECTOMY;  Surgeon: Armandina Gemma, MD;  Location: [facility];  Service: General;  Laterality: Right;     SOCIAL HISTORY:  Social History   Socioeconomic History   Marital status: Married    Spouse name: Not on file   Number of children: Not on file   Years of education: Not on file   Highest education level: Not on file  Occupational History   Not on file  Tobacco Use   Smoking status: Never   Smokeless tobacco: Never  Vaping Use   Vaping Use: Never used  Substance and Sexual Activity   Alcohol use: No   Drug use: No   Sexual activity: Not Currently  Other Topics Concern   Not on file  Social History Narrative   Not on file   Social Determinants of Health   Financial Resource Strain: Not on file  Food Insecurity: Not on file  Transportation Needs: Not on file  Physical Activity: Not on file  Stress: Not on file  Social Connections: Not on file  Intimate Partner Violence: Not on file    FAMILY HISTORY:  Family History  Problem Relation Age of Onset   Heart disease Mother        CHF   Cancer Mother        Uterine   Osteoporosis Mother    COPD Sister    Asthma Sister    Migraines Sister    Breast cancer Neg Hx     CURRENT MEDICATIONS:  Outpatient Encounter Medications as of 10/19/2021  Medication Sig Note   acetaminophen (TYLENOL) 500 MG tablet Take by mouth.    alendronate (FOSAMAX) 70 MG tablet Take 1 tablet (70 mg total) by mouth every 7 (seven) days. Take with a full glass of water on an empty stomach.    alum & mag hydroxide-simeth (MAALOX PLUS) 400-400-40 MG/5ML suspension Take by mouth.    aspirin EC 81 MG tablet Take 81 mg by mouth daily. Swallow whole.    cetirizine (ZYRTEC) 10 MG tablet Take 10 mg by mouth daily.    Cholecalciferol 25 MCG (1000 UT) tablet Take 3,000 Units by mouth daily.     dicyclomine (BENTYL) 10 MG capsule Take 10 mg by mouth in the morning and at bedtime.    DULoxetine (CYMBALTA) 60 MG capsule Take 1 capsule (60 mg  total) by mouth daily.    famotidine (PEPCID) 40 MG tablet one half tablet (20 mg dose) every morning.    FIBER PO Take by mouth.    fluticasone (FLONASE) 50 MCG/ACT nasal spray USE 2 SPRAYS IN EACH NOSTRIL EVERY DAY    furosemide (LASIX) 20 MG tablet TAKE 1 TABLET EVERY DAY    lisinopril (ZESTRIL) 20 MG tablet Take 1 tablet (20 mg total) by mouth daily.    loperamide (IMODIUM A-D) 2 MG tablet Take 1 tablet (2 mg total) by mouth 4 (four) times daily as needed for diarrhea or loose stools.    loratadine (CLARITIN) 10 MG tablet Take 10 mg by mouth daily. 04/16/2019: Alternates between zyrtec and claritin   meloxicam (MOBIC) 15 MG  tablet Take 1 tablet (15 mg total) by mouth daily.    Omega-3 Fatty Acids (FISH OIL) 1000 MG CAPS Take 1,000 mg by mouth daily.     ondansetron (ZOFRAN) 4 MG tablet Take 1 tablet (4 mg total) by mouth every 8 (eight) hours as needed for nausea or vomiting.    pantoprazole (PROTONIX) 40 MG tablet Take 1 tablet (40 mg total) by mouth daily.    Probiotic Product (PROBIOTIC & ACIDOPHILUS EX ST PO) Take 1 capsule by mouth at bedtime.     traMADol (ULTRAM) 50 MG tablet Take 1 tablet (50 mg total) by mouth every 8 (eight) hours as needed (mild pain).    No facility-administered encounter medications on file as of 10/19/2021.    ALLERGIES:  Allergies  Allergen Reactions   Codeine Nausea And Vomiting   Sulfa Antibiotics     Hair loss, yellowed skin   Doxycycline Hyclate Itching     PHYSICAL EXAM:   *** ECOG PERFORMANCE STATUS: 2 - Symptomatic, <50% confined to bed  There were no vitals filed for this visit. There were no vitals filed for this visit. Physical Exam Constitutional:      Appearance: Normal appearance. She is obese.  HENT:     Head: Normocephalic and atraumatic.     Mouth/Throat:     Mouth: Mucous membranes are moist.  Eyes:     Extraocular Movements: Extraocular movements intact.     Pupils: Pupils are equal, round, and reactive to light.   Cardiovascular:     Rate and Rhythm: Normal rate and regular rhythm.     Pulses: Normal pulses.     Heart sounds: Normal heart sounds.  Pulmonary:     Effort: Pulmonary effort is normal.     Breath sounds: Normal breath sounds.  Abdominal:     General: Bowel sounds are normal.     Palpations: Abdomen is soft.     Tenderness: There is no abdominal tenderness.  Musculoskeletal:        General: No swelling.     Right lower leg: Edema (trace) present.     Left lower leg: Edema (trace) present.  Lymphadenopathy:     Cervical: No cervical adenopathy.  Skin:    General: Skin is warm and dry.  Neurological:     General: No focal deficit present.     Mental Status: She is alert and oriented to person, place, and time.  Psychiatric:        Mood and Affect: Mood normal.        Behavior: Behavior normal.      LABORATORY DATA:  I have reviewed the labs as listed.  CBC    Component Value Date/Time   WBC 8.7 10/12/2021 1321   WBC 7.6 09/11/2021 0757   RBC 3.94 10/12/2021 1321   RBC 4.01 09/11/2021 0757   HGB 11.7 10/12/2021 1321   HCT 35.1 10/12/2021 1321   PLT 539 (H) 10/12/2021 1321   MCV 89 10/12/2021 1321   MCH 29.7 10/12/2021 1321   MCH 30.4 09/11/2021 0757   MCHC 33.3 10/12/2021 1321   MCHC 32.8 09/11/2021 0757   RDW 12.7 10/12/2021 1321   LYMPHSABS 2.0 10/12/2021 1321   MONOABS 0.8 09/11/2021 0757   EOSABS 0.3 10/12/2021 1321   BASOSABS 0.0 10/12/2021 1321      Latest Ref Rng & Units 06/11/2021   11:10 AM 06/11/2021   11:06 AM 05/15/2021   11:03 AM  CMP  Glucose 70 - 99 mg/dL 100  100  97   BUN 8 - 27 mg/dL 23  25  20    Creatinine 0.57 - 1.00 mg/dL 1.18  1.23  1.06   Sodium 134 - 144 mmol/L 139  138  138   Potassium 3.5 - 5.2 mmol/L 5.1  5.2  5.1   Chloride 96 - 106 mmol/L 101  100  97   CO2 20 - 29 mmol/L 21  20  25    Calcium 8.7 - 10.3 mg/dL 9.7  9.7  9.9   Total Protein 6.0 - 8.5 g/dL  6.8  7.2   Total Bilirubin 0.0 - 1.2 mg/dL  0.4  0.6   Alkaline Phos 44  - 121 IU/L  135  143   AST 0 - 40 IU/L  22  26   ALT 0 - 32 IU/L  22  33     DIAGNOSTIC IMAGING:  I have independently reviewed the relevant imaging and discussed with the patient.  ASSESSMENT & PLAN: 1.  Thrombocytosis: - She had thrombocytosis for the last several years. - JAK2 V617F and reflex mutation testing was negative.  BCR/ABL FISH was negative. - No history of DVT or PE. - Platelet count was previously slightly improved after IV iron infusions, so this was thought to be reactive. - However, recent labs show persistent thrombocytosis despite adequate iron stores. - Lifelong non-smoker.  No definitive history of autoimmune or inflammatory disease, but was previously told that she "might have lupus or fibromyalgia." - ANA and rheumatoid factor are negative (12/04/2020). - CRP normal.  ESR ranges from normal to mildly elevated at 46. - Bone marrow biopsy (09/11/2021): Hypercellular bone marrow with mild megakaryocytic hyperplasia with mild atypia with otherwise orderly erythroid and myeloid maturation.  Per pathologist, this still could represent a reactive process, the persistence of her thrombocytosis is concerning and repeat BCR/ABL and NexGen myeloid panel were recommended.  Marrow showed increased histiocytic iron stores  Cytogenetics with normal female karyotype 46, XX[20] BCR/ABL negative.  JAK2 V617F negative.  JAK2 exon 12-13 negative.  CALR negative. 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  LDH normal. - Aspirin 81 mg  *** was recommended at previous office visit, patient has not been taking it due to increased bruising. ***  - PLAN: *** We discussed NF1 mutation, which is of uncertain clinical significance.  In some studies of patients with confirmed JAK2 MPN's, additional mutation of NF1 cause increased severity of  MPN.  However, little data concerning isolated mutations of NF1 alone. *** - *** What should we do??  *** - Education provided on importance of taking aspirin 81 mg daily to help decrease risk of VTE in the setting of thrombocytosis.  Patient reports that she will start taking it again.  2.  Anemia of chronic disease +/- iron deficiency -This is a combination of CKD and iron deficiency state. - EGD (09/20/2019): Gastritis, gastric polyp, hiatal hernia - She had hiatal hernia repair November 2021 - Last Feraheme infusion was on 10/26/2019 and 11/02/2019 -  She reports occasional "dark and sticky" stools, but denies any black bowel movements.  No bright red blood per rectum.*** - She had a positive Cologuard test on 03/11/2021. - Colonoscopy at [facility] on 06/10/2021 - review of results via [place] which showed internal hemorrhoids, few diverticula and nonspecific bulbous ileocecal valve which was biopsied.  I am not able  to see pathology results. - She complains of significant fatigue, which is chronic*** - Most recent labs (10/12/2021): Hgb 11.7/MCV 89, ferritin 440, iron saturation 34%.  Most recent renal panel in June 2023 showed creatinine 1.18/GFR 50. - PLAN: No anemia or iron deficiency at this time.  No intervention needed.  Repeat labs and RTC in 4 months  3.  Vitamin B12 deficiency: - She was previously taking B12 cyanocobalamin 1 mg daily ***  - Most recent B12 (10/12/2021) is normal at 663 with normal MMA. - PLAN: No need for supplementation at this time.  Recheck B12 and methylmalonic acid in 6 months.  4.  Papillary thyroid cancer, follicular variant - Resection of cancerous thyroid nodule in May 2021 - Followed by Dr. Renne Crigler, who discussed with patient that her thyroid cancer is low risk since it was smaller than 2 cm and without any signs of extension or invasion in the lymphovascular system. - Per Dr. Renne Crigler, no additional treatment was necessary, and she has been  following the patient with annual ultrasounds. - New left thyroid nodule found to be benign per FNA biopsy in March 2023. - PLAN: Continue follow-up with Dr. Renne Crigler  5.  Primary hyperparathyroidism -Patient has history of hypercalcemia, which was found to be primary hypercalcemia - Calcium decreased to normal after surgery (May 2021) - PLAN: Continue follow-up with Dr. Renne Crigler   PLAN SUMMARY & DISPOSITION:   Labs in 4 months (at [facility]) - CBC, ferritin, iron/TIBC, B12, methylmalonic acid, LDH ***  RTC after labs  All questions were answered. The patient knows to call the clinic with any problems, questions or concerns.  Medical decision making: Moderate    Time spent on visit: I spent 20 minutes counseling the patient face to face. The total time spent in the appointment was 30 minutes and more than 50% was on counseling.   Harriett Rush, PA-C   ***

## 2021-10-19 ENCOUNTER — Other Ambulatory Visit: Payer: Self-pay

## 2021-10-19 ENCOUNTER — Inpatient Hospital Stay: Payer: Medicare HMO | Attending: Hematology | Admitting: Physician Assistant

## 2021-10-19 VITALS — BP 142/77 | HR 86 | Temp 97.3°F | Resp 18 | Ht 61.0 in | Wt 147.4 lb

## 2021-10-19 DIAGNOSIS — E538 Deficiency of other specified B group vitamins: Secondary | ICD-10-CM | POA: Insufficient documentation

## 2021-10-19 DIAGNOSIS — D638 Anemia in other chronic diseases classified elsewhere: Secondary | ICD-10-CM | POA: Diagnosis not present

## 2021-10-19 DIAGNOSIS — I129 Hypertensive chronic kidney disease with stage 1 through stage 4 chronic kidney disease, or unspecified chronic kidney disease: Secondary | ICD-10-CM | POA: Diagnosis not present

## 2021-10-19 DIAGNOSIS — D509 Iron deficiency anemia, unspecified: Secondary | ICD-10-CM | POA: Diagnosis not present

## 2021-10-19 DIAGNOSIS — Z85850 Personal history of malignant neoplasm of thyroid: Secondary | ICD-10-CM | POA: Insufficient documentation

## 2021-10-19 DIAGNOSIS — D631 Anemia in chronic kidney disease: Secondary | ICD-10-CM | POA: Insufficient documentation

## 2021-10-19 DIAGNOSIS — N189 Chronic kidney disease, unspecified: Secondary | ICD-10-CM | POA: Insufficient documentation

## 2021-10-19 DIAGNOSIS — D75839 Thrombocytosis, unspecified: Secondary | ICD-10-CM | POA: Insufficient documentation

## 2021-10-19 DIAGNOSIS — E210 Primary hyperparathyroidism: Secondary | ICD-10-CM | POA: Diagnosis not present

## 2021-10-19 NOTE — Patient Instructions (Signed)
[facility] **   You were seen today by Tarri Abernethy PA-C for your elevated platelets.    ELEVATED PLATELETS Your bone marrow biopsy did not show any obvious sign of blood cancer or leukemia.  It did show some mild abnormalities in your pre-platelet cells.  These abnormalities are not necessarily associated with any specific diagnosis.  They could indicate early bone marrow abnormalities, or could simply be present as a reaction to chronic disease and inflammation. Your genetic testing did not show any genetic mutations that are causative of blood disorders.  You did have a positive mutation for "NF1," which may or may not be associated with increased blood counts in some patients (there is limited data on this at present). You do not need any treatment of your elevated platelets, other than taking aspirin 81 mg daily. We will continue to check your blood counts and platelets periodically, and if you have significantly elevated platelets in the future, we would start you on medication to decrease your platelets.  LABS: Check labs in 4 months your primary care doctor's office.  FOLLOW-UP APPOINTMENT: Office visit in 4 months, 1 week after labs  ** Thank you for trusting me with your healthcare!  I strive to provide all of my patients with quality care at each visit.  If you receive a survey for this visit, I would be so grateful to you for taking the time to provide feedback.  Thank you in advance!  ~ Venna Berberich                   Dr. Derek Jack   &   Tarri Abernethy, PA-C   - - - - - - - - - - - - - - - - - -    Thank you for choosing [facility] to provide your oncology and hematology care.  To afford each patient quality time with our provider, please arrive at least 15 minutes before your scheduled appointment time.   If you have a lab appointment with the [facility] please come in thru the Main Entrance and check in at the main information desk.  You need to re-schedule your appointment should you arrive 10 or more minutes late.  We strive to give you quality time with our providers, and arriving late affects you and other patients whose appointments are after yours.  Also, if you no show three or more times for appointments you may be dismissed from the clinic at the providers discretion.     Again, thank you for choosing [facility].  Our hope is that these requests will decrease the amount of time that you wait before being seen by our physicians.       _____________________________________________________________  Should you have questions after your visit to [facility], please contact our office at [phone] and follow the prompts.  Our office hours are 8:00 a.m. and 4:30 p.m. Monday - Friday.  Please note that voicemails left after 4:00 p.m. may not be returned until the following business day.  We are closed weekends and major holidays.  You do have access to a nurse 24-7, just call the main number to the clinic [phone] and do not press any options, hold on the line and a nurse will answer the phone.    For prescription refill requests, have your pharmacy contact our office  and allow 72 hours.      

## 2021-11-01 ENCOUNTER — Other Ambulatory Visit: Payer: Self-pay | Admitting: Family Medicine

## 2021-11-25 ENCOUNTER — Other Ambulatory Visit: Payer: Self-pay | Admitting: Family Medicine

## 2021-11-25 DIAGNOSIS — G8929 Other chronic pain: Secondary | ICD-10-CM

## 2021-11-25 DIAGNOSIS — M5136 Other intervertebral disc degeneration, lumbar region: Secondary | ICD-10-CM

## 2021-11-25 DIAGNOSIS — F411 Generalized anxiety disorder: Secondary | ICD-10-CM

## 2021-11-28 ENCOUNTER — Other Ambulatory Visit: Payer: Self-pay | Admitting: Family Medicine

## 2021-11-28 DIAGNOSIS — M5136 Other intervertebral disc degeneration, lumbar region: Secondary | ICD-10-CM

## 2021-12-04 ENCOUNTER — Encounter: Payer: Self-pay | Admitting: Family Medicine

## 2021-12-04 ENCOUNTER — Ambulatory Visit (INDEPENDENT_AMBULATORY_CARE_PROVIDER_SITE_OTHER): Payer: Medicare HMO | Admitting: Family Medicine

## 2021-12-04 VITALS — BP 128/77 | HR 71 | Temp 97.6°F | Ht 61.0 in | Wt 148.0 lb

## 2021-12-04 DIAGNOSIS — E782 Mixed hyperlipidemia: Secondary | ICD-10-CM | POA: Diagnosis not present

## 2021-12-04 DIAGNOSIS — I10 Essential (primary) hypertension: Secondary | ICD-10-CM

## 2021-12-04 DIAGNOSIS — M5136 Other intervertebral disc degeneration, lumbar region: Secondary | ICD-10-CM | POA: Diagnosis not present

## 2021-12-04 MED ORDER — TRAMADOL HCL 50 MG PO TABS: 50.0000 mg | ORAL_TABLET | Freq: Three times a day (TID) | ORAL | 2 refills | Status: DC | PRN

## 2021-12-04 NOTE — Progress Notes (Signed)
BP 128/77   Pulse 71   Temp 97.6 F (36.4 C)   Ht 5\' 1"  (1.549 m)   Wt 148 lb (67.1 kg)   SpO2 98%   BMI 27.96 kg/m    Subjective:   Patient ID: Maria Moss, female    DOB: December 12, 1950, 71 y.o.   MRN: [id number]  HPI: Maria Moss is a 71 y.o. female presenting on 12/04/2021 for Medical Management of Chronic Issues, Hyperlipidemia, Hypertension, and Anxiety   HPI Pain assessment: Cause of pain-degenerative disc disease lumbar Pain location-lower back Pain on scale of 1-10- 3 Frequency-daily What increases pain-weather changes and movement What makes pain Better-medication Effects on ADL -minimal Any change in general medical condition-1  Current opioids rx-tramadol 50 mg 3 times daily as needed # meds rx-90/month Effectiveness of current meds-works well Adverse reactions from pain meds-none Morphine equivalent-15  Pill count performed-No Last drug screen -02/25/2021 ( high risk q43m, moderate risk q60m, low risk yearly ) Urine drug screen today- No Was the [facility] reviewed-yes  If yes were their any concerning findings? -None  Pain contract signed on: 02/25/2021  Hyperlipidemia Patient is coming in for recheck of his hyperlipidemia. The patient is currently taking fish oil. They deny any issues with myalgias or history of liver damage from it. They deny any focal numbness or weakness or chest pain.   Hypertension Patient is currently on lisinopril and furosemide, and their blood pressure today is 128/77. Patient denies any lightheadedness or dizziness. Patient denies headaches, blurred vision, chest pains, shortness of breath, or weakness. Denies any side effects from medication and is content with current medication.   Relevant past medical, surgical, family and social history reviewed and updated as indicated. Interim medical history since our last visit reviewed. Allergies and medications reviewed and updated.  Review of Systems  Constitutional:  Negative for chills and  fever.  Eyes:  Negative for visual disturbance.  Respiratory:  Negative for chest tightness and shortness of breath.   Cardiovascular:  Negative for chest pain and leg swelling.  Genitourinary:  Negative for difficulty urinating and dysuria.  Musculoskeletal:  Positive for arthralgias and back pain. Negative for gait problem.  Skin:  Negative for rash.  Neurological:  Negative for dizziness, light-headedness and headaches.  Psychiatric/Behavioral:  Negative for agitation and behavioral problems.   All other systems reviewed and are negative.   Per HPI unless specifically indicated above   Allergies as of 12/04/2021       Reactions   Codeine Nausea And Vomiting   Sulfa Antibiotics    Hair loss, yellowed skin   Doxycycline Hyclate Itching        Medication List        Accurate as of December 04, 2021 10:31 AM. If you have any questions, ask your nurse or doctor.          acetaminophen 500 MG tablet Commonly known as: TYLENOL Take by mouth.   alendronate 70 MG tablet Commonly known as: FOSAMAX Take 1 tablet (70 mg total) by mouth every 7 (seven) days. Take with a full glass of water on an empty stomach.   alum & mag hydroxide-simeth 400-400-40 MG/5ML suspension Commonly known as: MAALOX PLUS Take by mouth.   aspirin EC 81 MG tablet Take 81 mg by mouth daily. Swallow whole.   cetirizine 10 MG tablet Commonly known as: ZYRTEC Take 10 mg by mouth daily.   Cholecalciferol 25 MCG (1000 UT) tablet Take 3,000 Units by mouth daily.  dicyclomine 20 MG tablet Commonly known as: BENTYL Take 20 mg by mouth every 6 (six) hours. What changed: Another medication with the same name was removed. Continue taking this medication, and follow the directions you see here. Changed by: Elige Radon Yong Grieser, MD   DULoxetine 60 MG capsule Commonly known as: CYMBALTA TAKE 1 CAPSULE EVERY DAY   famotidine 40 MG tablet Commonly known as: PEPCID one half tablet (20 mg dose) every  morning.   FIBER PO Take by mouth.   Fish Oil 1000 MG Caps Take 1,000 mg by mouth daily.   fluticasone 50 MCG/ACT nasal spray Commonly known as: FLONASE USE 2 SPRAYS IN EACH NOSTRIL EVERY DAY   furosemide 20 MG tablet Commonly known as: LASIX TAKE 1 TABLET EVERY DAY   lisinopril 20 MG tablet Commonly known as: ZESTRIL Take 1 tablet (20 mg total) by mouth daily.   loperamide 2 MG tablet Commonly known as: Imodium A-D Take 1 tablet (2 mg total) by mouth 4 (four) times daily as needed for diarrhea or loose stools.   loratadine 10 MG tablet Commonly known as: CLARITIN Take 10 mg by mouth daily.   meloxicam 15 MG tablet Commonly known as: MOBIC TAKE 1 TABLET EVERY DAY   ondansetron 4 MG tablet Commonly known as: Zofran Take 1 tablet (4 mg total) by mouth every 8 (eight) hours as needed for nausea or vomiting.   pantoprazole 40 MG tablet Commonly known as: PROTONIX Take 1 tablet (40 mg total) by mouth daily.   PROBIOTIC & ACIDOPHILUS EX ST PO Take 1 capsule by mouth at bedtime.   traMADol 50 MG tablet Commonly known as: ULTRAM Take 1 tablet (50 mg total) by mouth every 8 (eight) hours as needed (mild pain).         Objective:   BP 128/77   Pulse 71   Temp 97.6 F (36.4 C)   Ht 5\' 1"  (1.549 m)   Wt 148 lb (67.1 kg)   SpO2 98%   BMI 27.96 kg/m   Wt Readings from Last 3 Encounters:  12/04/21 148 lb (67.1 kg)  10/19/21 147 lb 6.4 oz (66.9 kg)  09/11/21 147 lb 11.3 oz (67 kg)    Physical Exam Vitals and nursing note reviewed.  Constitutional:      General: She is not in acute distress.    Appearance: She is well-developed. She is not diaphoretic.  Eyes:     Conjunctiva/sclera: Conjunctivae normal.  Cardiovascular:     Rate and Rhythm: Normal rate and regular rhythm.     Heart sounds: Normal heart sounds. No murmur heard. Pulmonary:     Effort: Pulmonary effort is normal. No respiratory distress.     Breath sounds: Normal breath sounds. No wheezing.   Skin:    General: Skin is warm and dry.     Findings: No rash.  Neurological:     Mental Status: She is alert and oriented to person, place, and time.     Coordination: Coordination normal.  Psychiatric:        Behavior: Behavior normal.       Assessment & Plan:   Problem List Items Addressed This Visit       Cardiovascular and Mediastinum   Hypertension - Primary (Chronic)   Relevant Orders   CMP14+EGFR     Musculoskeletal and Integument   Degenerative disc disease, lumbar (Chronic)   Relevant Medications   traMADol (ULTRAM) 50 MG tablet   Other Relevant Orders   ToxASSURE Select  13 (MW), Urine     Other   Hyperlipemia (Chronic)   Relevant Orders   CMP14+EGFR   Lipid panel    Continue current medicine, will check some blood work.  No changes Follow up plan: Return in about 3 months (around 03/05/2022), or if symptoms worsen or fail to improve, for Pain and Hypertension and hyperlipidemia.  Counseling provided for all of the vaccine components Orders Placed This Encounter  Procedures   CMP14+EGFR   Lipid panel   ToxASSURE Select 13 (MW), Urine    Arville Care, MD [place] [facility] 12/04/2021, 10:31 AM

## 2021-12-05 LAB — CMP14+EGFR
ALT: 16 IU/L (ref 0–32)
AST: 20 IU/L (ref 0–40)
Albumin/Globulin Ratio: 2.2 (ref 1.2–2.2)
Albumin: 4.6 g/dL (ref 3.9–4.9)
Alkaline Phosphatase: 124 IU/L — ABNORMAL HIGH (ref 44–121)
BUN/Creatinine Ratio: 21 (ref 12–28)
BUN: 29 mg/dL — ABNORMAL HIGH (ref 8–27)
Bilirubin Total: 0.4 mg/dL (ref 0.0–1.2)
CO2: 21 mmol/L (ref 20–29)
Calcium: 9.1 mg/dL (ref 8.7–10.3)
Chloride: 100 mmol/L (ref 96–106)
Creatinine, Ser: 1.36 mg/dL — ABNORMAL HIGH (ref 0.57–1.00)
Globulin, Total: 2.1 g/dL (ref 1.5–4.5)
Glucose: 94 mg/dL (ref 70–99)
Potassium: 5.1 mmol/L (ref 3.5–5.2)
Sodium: 137 mmol/L (ref 134–144)
Total Protein: 6.7 g/dL (ref 6.0–8.5)
eGFR: 42 mL/min/{1.73_m2} — ABNORMAL LOW (ref >59)

## 2021-12-05 LAB — LIPID PANEL
Chol/HDL Ratio: 3.9 ratio (ref 0.0–4.4)
Cholesterol, Total: 249 mg/dL — ABNORMAL HIGH (ref 100–199)
HDL: 64 mg/dL (ref >39)
LDL Chol Calc (NIH): 152 mg/dL — ABNORMAL HIGH (ref 0–99)
Triglycerides: 183 mg/dL — ABNORMAL HIGH (ref 0–149)
VLDL Cholesterol Cal: 33 mg/dL (ref 5–40)

## 2021-12-08 LAB — TOXASSURE SELECT 13 (MW), URINE

## 2021-12-10 NOTE — Progress Notes (Signed)
Patient returning call. Please call back

## 2021-12-11 ENCOUNTER — Other Ambulatory Visit: Payer: Self-pay

## 2021-12-11 MED ORDER — EZETIMIBE 10 MG PO TABS: 10.0000 mg | ORAL_TABLET | Freq: Every evening | ORAL | 1 refills | Status: DC

## 2022-01-07 ENCOUNTER — Other Ambulatory Visit: Payer: Self-pay | Admitting: Family Medicine

## 2022-01-07 DIAGNOSIS — I10 Essential (primary) hypertension: Secondary | ICD-10-CM

## 2022-01-26 ENCOUNTER — Ambulatory Visit (INDEPENDENT_AMBULATORY_CARE_PROVIDER_SITE_OTHER): Payer: Medicare HMO

## 2022-01-26 VITALS — Ht 63.0 in | Wt 148.0 lb

## 2022-01-26 DIAGNOSIS — Z0000 Encounter for general adult medical examination without abnormal findings: Secondary | ICD-10-CM

## 2022-01-26 DIAGNOSIS — Z0100 Encounter for examination of eyes and vision without abnormal findings: Secondary | ICD-10-CM

## 2022-01-26 NOTE — Progress Notes (Signed)
Subjective:   Maria Moss is a 72 y.o. female who presents for Medicare Annual (Subsequent) preventive examination. I connected with  Maria Moss on 01/26/22 by a audio enabled telemedicine application and verified that I am speaking with the correct person using two identifiers.  Patient Location: Home  Provider Location: Home Office  I discussed the limitations of evaluation and management by telemedicine. The patient expressed understanding and agreed to proceed.  Review of Systems     Cardiac Risk Factors include: advanced age (>53mn, >>78women);hypertension     Objective:    Today's Vitals   There is no height or weight on file to calculate BMI.     01/26/2022    3:42 PM 10/19/2021   10:42 AM 09/11/2021    8:03 AM 06/15/2021   11:43 AM 12/04/2020    8:06 AM 09/09/2020   10:26 AM 11/02/2019    2:45 PM  Advanced Directives  Does Patient Have a Medical Advance Directive? Yes No No Yes Yes Yes Yes  Type of [place]   Living will;Healthcare Power of Attorney Living will;Healthcare Power of Attorney Living will;Healthcare Power of [place]  Does patient want to make changes to medical advance directive?    No - Patient declined No - Patient declined No - Patient declined No - Patient declined  Copy of [place] Chart? No - copy requested   No - copy requested No - copy requested  No - copy requested  Would patient like information on creating a medical advance directive?  No - Patient declined No - Patient declined    No - Patient declined    Current Medications (verified) Outpatient Encounter Medications as of 01/26/2022  Medication Sig   acetaminophen (TYLENOL) 500 MG tablet Take by mouth.   alendronate (FOSAMAX) 70 MG tablet Take 1 tablet (70 mg total) by mouth every 7 (seven) days. Take with a full glass of water on an empty stomach.   alum & mag hydroxide-simeth  (MAALOX PLUS) 400-400-40 MG/5ML suspension Take by mouth.   aspirin EC 81 MG tablet Take 81 mg by mouth daily. Swallow whole.   cetirizine (ZYRTEC) 10 MG tablet Take 10 mg by mouth daily.   Cholecalciferol 25 MCG (1000 UT) tablet Take 3,000 Units by mouth daily.    dicyclomine (BENTYL) 20 MG tablet Take 20 mg by mouth every 6 (six) hours.   DULoxetine (CYMBALTA) 60 MG capsule TAKE 1 CAPSULE EVERY DAY   ezetimibe (ZETIA) 10 MG tablet Take 1 tablet (10 mg total) by mouth at bedtime.   famotidine (PEPCID) 40 MG tablet one half tablet (20 mg dose) every morning.   FIBER PO Take by mouth.   fluticasone (FLONASE) 50 MCG/ACT nasal spray USE 2 SPRAYS IN EACH NOSTRIL EVERY DAY   furosemide (LASIX) 20 MG tablet TAKE 1 TABLET EVERY DAY   lisinopril (ZESTRIL) 20 MG tablet Take 1 tablet (20 mg total) by mouth daily.   loperamide (IMODIUM A-D) 2 MG tablet Take 1 tablet (2 mg total) by mouth 4 (four) times daily as needed for diarrhea or loose stools.   loratadine (CLARITIN) 10 MG tablet Take 10 mg by mouth daily.   meloxicam (MOBIC) 15 MG tablet TAKE 1 TABLET EVERY DAY   Omega-3 Fatty Acids (FISH OIL) 1000 MG CAPS Take 1,000 mg by mouth daily.    ondansetron (ZOFRAN) 4 MG tablet Take 1 tablet (4 mg total)  by mouth every 8 (eight) hours as needed for nausea or vomiting.   pantoprazole (PROTONIX) 40 MG tablet Take 1 tablet (40 mg total) by mouth daily.   Probiotic Product (PROBIOTIC & ACIDOPHILUS EX ST PO) Take 1 capsule by mouth at bedtime.    traMADol (ULTRAM) 50 MG tablet Take 1 tablet (50 mg total) by mouth every 8 (eight) hours as needed (mild pain).   No facility-administered encounter medications on file as of 01/26/2022.    Allergies (verified) Codeine, Sulfa antibiotics, and Doxycycline hyclate   History: Past Medical History:  Diagnosis Date   Anemia    iron deficiency   Ankle fracture, left 2006   Diverticula, intestine    Mild case.   Fibromyalgia    Dr. Justine Null Dx in  years ago   no meds    GERD (gastroesophageal reflux disease)    History of hiatal hernia    Hyperlipidemia    Hyperparathyroidism ([place])    Hypertension    Dx age 38.    Stroke [facility])    Questionable stoke when born or polio not sure pt. has right side weakness toes right foot turns outward and toes curled up some    Past Surgical History:  Procedure Laterality Date   ANKLE SURGERY Left    Broken.  Has screws and metal plate.   CHOLECYSTECTOMY     PARATHYROIDECTOMY Right 05/29/2019   Procedure: RIGHT INFERIOR PARATHYROIDECTOMY;  Surgeon: Armandina Gemma, MD;  Location: [facility];  Service: General;  Laterality: Right;   ROBOTIC ADRENALECTOMY Right 05/31/2018   Procedure: XI ROBOTIC RIGHT ADRENALECTOMY;  Surgeon: Ralene Ok, MD;  Location: [facility];  Service: General;  Laterality: Right;   THYROID LOBECTOMY Right 05/29/2019   Procedure: RIGHT THYROID LOBECTOMY;  Surgeon: Armandina Gemma, MD;  Location: [facility];  Service: General;  Laterality: Right;   Family History  Problem Relation Age of Onset   Heart disease Mother        CHF   Cancer Mother        Uterine   Osteoporosis Mother    COPD Sister    Asthma Sister    Migraines Sister    Breast cancer Neg Hx    Social History   Socioeconomic History   Marital status: Married    Spouse name: Not on file   Number of children: Not on file   Years of education: Not on file   Highest education level: Not on file  Occupational History   Not on file  Tobacco Use   Smoking status: Never   Smokeless tobacco: Never  Vaping Use   Vaping Use: Never used  Substance and Sexual Activity   Alcohol use: No   Drug use: No   Sexual activity: Not Currently  Other Topics Concern   Not on file  Social History Narrative   Not on file   Social Determinants of Health   Financial Resource Strain: Low Risk  (01/26/2022)   Overall Financial Resource Strain (CARDIA)    Difficulty of Paying Living Expenses: Not hard at all  Food Insecurity: No Food  Insecurity (01/26/2022)   Hunger Vital Sign    Worried About Running Out of Food in the Last Year: Never true    Ran Out of Food in the Last Year: Never true  Transportation Needs: No Transportation Needs (01/26/2022)   PRAPARE - Hydrologist (Medical): No    Lack of Transportation (Non-Medical): No  Physical Activity: Insufficiently Active (01/26/2022)  Exercise Vital Sign    Days of Exercise per Week: 5 days    Minutes of Exercise per Session: 20 min  Stress: No Stress Concern Present (01/26/2022)   [place]    Feeling of Stress : Not at all  Social Connections: [place] (01/26/2022)   Social Connection and Isolation Panel [NHANES]    Frequency of Communication with Friends and Family: More than three times a week    Frequency of Social Gatherings with Friends and Family: More than three times a week    Attends Religious Services: More than 4 times per year    Active Member of [employer] or Organizations: Yes    Attends Music therapist: More than 4 times per year    Marital Status: Married    Tobacco Counseling Counseling given: Not Answered   Clinical Intake:  Pre-visit preparation completed: Yes  Pain : No/denies pain     Nutritional Risks: None Diabetes: No  How often do you need to have someone help you when you read instructions, pamphlets, or other written materials from your doctor or pharmacy?: 1 - Never  Diabetic?no   Interpreter Needed?: No  Information entered by :: Jadene Pierini, LPN   Activities of Daily Living    01/26/2022    3:42 PM  In your present state of health, do you have any difficulty performing the following activities:  Hearing? 0  Vision? 0  Difficulty concentrating or making decisions? 0  Walking or climbing stairs? 0  Dressing or bathing? 0  Doing errands, shopping? 0  Preparing Food and eating ? N  Using the Toilet?  N  In the past six months, have you accidently leaked urine? N  Do you have problems with loss of bowel control? N  Managing your Medications? N  Managing your Finances? N  Housekeeping or managing your Housekeeping? N    Patient Care Team: Dettinger, Fransisca Kaufmann, MD as PCP - General (Family Medicine) Derek Jack, MD as Consulting Physician (Hematology)  Indicate any recent Medical Services you may have received from other than [facility] providers in the past year (date may be approximate).     Assessment:   This is a routine wellness examination for [facility].  Hearing/Vision screen Vision Screening - Comments:: Referral 01/26/2022  Dietary issues and exercise activities discussed: Current Exercise Habits: Home exercise routine, Type of exercise: strength training/weights, Time (Minutes): 20, Frequency (Times/Week): 5, Weekly Exercise (Minutes/Week): 100, Intensity: Mild, Exercise limited by: orthopedic condition(s)   Goals Addressed             This Visit's Progress    Prevent falls   On track      Depression Screen    01/26/2022    3:41 PM 12/04/2021   10:21 AM 08/21/2021   10:13 AM 05/15/2021   10:22 AM 02/06/2021   11:48 AM 11/07/2020    1:10 PM 08/08/2020    9:09 AM  PHQ 2/9 Scores  PHQ - 2 Score 0 0 0 0 0 0 0  PHQ- 9 Score 0 '5  4 3      '$ Fall Risk    01/26/2022    3:39 PM 12/04/2021   10:21 AM 08/21/2021   10:13 AM 05/15/2021   10:22 AM 02/06/2021   11:48 AM  Fall Risk   Falls in the past year? 0 0 0 0 0  Number falls in past yr: 0      Injury with Fall?  0      Risk for fall due to : No Fall Risks      Follow up Falls prevention discussed        [place]:  Any stairs in or around the home? No  If so, are there any without handrails? No  Home free of loose throw rugs in walkways, pet beds, electrical cords, etc? Yes  Adequate lighting in your home to reduce risk of falls? Yes   ASSISTIVE DEVICES UTILIZED TO PREVENT  FALLS:  Life alert? No  Use of a cane, walker or w/c? No  Grab bars in the bathroom? No  Shower chair or bench in shower? No  Elevated toilet seat or a handicapped toilet? No          01/26/2022    3:43 PM 09/09/2020   10:28 AM  6CIT Screen  What Year? 0 points 0 points  What month? 0 points 0 points  What time? 0 points 0 points  Count back from 20 0 points 0 points  Months in reverse 0 points 0 points  Repeat phrase 0 points 0 points  Total Score 0 points 0 points    Immunizations Immunization History  Administered Date(s) Administered   Fluad Quad(high Dose 65+) 10/13/2018, 11/07/2020   Influenza, High Dose Seasonal PF 10/21/2017   Influenza,inj,Quad PF,6+ Mos 11/03/2012, 10/24/2013, 10/18/2014, 11/19/2015, 11/11/2016   Influenza-Unspecified 10/20/2010, 12/15/2011   Moderna Sars-Covid-2 Vaccination 03/05/2019, 04/02/2019   PFIZER(Purple Top)SARS-COV-2 Vaccination 12/24/2019   Pneumococcal Conjugate-13 04/14/2017   Pneumococcal Polysaccharide-23 10/13/2018   Td 07/30/2013   Tdap 07/30/2013    TDAP status: Up to date  Flu Vaccine status: Up to date  Pneumococcal vaccine status: Up to date  Covid-19 vaccine status: Completed vaccines  Qualifies for Shingles Vaccine? Yes   Zostavax completed No   Shingrix Completed?: No.    Education has been provided regarding the importance of this vaccine. Patient has been advised to call insurance company to determine out of pocket expense if they have not yet received this vaccine. Advised may also receive vaccine at local pharmacy or Health Dept. Verbalized acceptance and understanding.  Screening Tests Health Maintenance  Topic Date Due   COVID-19 Vaccine (4 - 2023-24 season) 09/04/2021   INFLUENZA VACCINE  04/04/2022 (Originally 08/04/2021)   Zoster Vaccines- Shingrix (1 of 2) 08/16/2022 (Originally 02/05/1969)   MAMMOGRAM  04/09/2022   DEXA SCAN  05/02/2022   COLON CANCER SCREENING ANNUAL FOBT  06/11/2022   Medicare Annual  Wellness (AWV)  01/27/2023   DTaP/Tdap/Td (3 - Td or Tdap) 07/31/2023   COLONOSCOPY (Pts 45-37yr Insurance coverage will need to be confirmed)  06/11/2031   Pneumonia Vaccine 70 Years old  Completed   Hepatitis C Screening  Completed   HPV VACCINES  Aged Out    Health Maintenance  Health Maintenance Due  Topic Date Due   COVID-19 Vaccine (4 - 2023-24 season) 09/04/2021    Colorectal cancer screening: Type of screening: Colonoscopy. Completed 06/10/2021. Repeat every 10 years  Mammogram status: Completed 04/08/2021. Repeat every year  Bone Density status: Completed 05/01/2020. Results reflect: Bone density results: OSTEOPOROSIS. Repeat every 2 years.  Lung Cancer Screening: (Low Dose CT Chest recommended if Age 70-80years, 30 pack-year currently smoking OR have quit w/in 15years.) does not qualify.   Lung Cancer Screening Referral: n/a  Additional Screening:  Hepatitis C Screening: does not qualify;   Vision Screening: Recommended annual ophthalmology exams for early detection of glaucoma  and other disorders of the eye. Is the patient up to date with their annual eye exam?  Yes  Who is the provider or what is the name of the office in which the patient attends annual eye exams? None , Referral 01/26/2022 If pt is not established with a provider, would they like to be referred to a provider to establish care? No .   Dental Screening: Recommended annual dental exams for proper oral hygiene  Community Resource Referral / Chronic Care Management: CRR required this visit?  No   CCM required this visit?  No      Plan:     I have personally reviewed and noted the following in the patient's chart:   Medical and social history Use of alcohol, tobacco or illicit drugs  Current medications and supplements including opioid prescriptions. Patient is not currently taking opioid prescriptions. Functional ability and status Nutritional status Physical activity Advanced  directives List of other physicians Hospitalizations, surgeries, and ER visits in previous 12 months Vitals Screenings to include cognitive, depression, and falls Referrals and appointments  In addition, I have reviewed and discussed with patient certain preventive protocols, quality metrics, and best practice recommendations. A written personalized care plan for preventive services as well as general preventive health recommendations were provided to patient.     Daphane Shepherd, LPN   [id number]   Nurse Notes: none

## 2022-01-29 ENCOUNTER — Ambulatory Visit: Payer: Medicare HMO | Admitting: Internal Medicine

## 2022-02-12 ENCOUNTER — Other Ambulatory Visit: Payer: Medicare HMO

## 2022-02-12 DIAGNOSIS — D509 Iron deficiency anemia, unspecified: Secondary | ICD-10-CM | POA: Diagnosis not present

## 2022-02-12 DIAGNOSIS — D638 Anemia in other chronic diseases classified elsewhere: Secondary | ICD-10-CM | POA: Diagnosis not present

## 2022-02-12 DIAGNOSIS — D75839 Thrombocytosis, unspecified: Secondary | ICD-10-CM | POA: Diagnosis not present

## 2022-02-13 LAB — IRON AND TIBC
Iron Saturation: 35 % (ref 15–55)
Iron: 93 ug/dL (ref 27–139)
Total Iron Binding Capacity: 268 ug/dL (ref 250–450)
UIBC: 175 ug/dL (ref 118–369)

## 2022-02-13 LAB — CBC WITH DIFFERENTIAL/PLATELET
Basophils Absolute: 0 10*3/uL (ref 0.0–0.2)
Basos: 0 %
EOS (ABSOLUTE): 0.4 10*3/uL (ref 0.0–0.4)
Eos: 5 %
Hematocrit: 35.2 % (ref 34.0–46.6)
Hemoglobin: 11.7 g/dL (ref 11.1–15.9)
Immature Grans (Abs): 0 10*3/uL (ref 0.0–0.1)
Immature Granulocytes: 0 %
Lymphocytes Absolute: 1.7 10*3/uL (ref 0.7–3.1)
Lymphs: 20 %
MCH: 29.7 pg (ref 26.6–33.0)
MCHC: 33.2 g/dL (ref 31.5–35.7)
MCV: 89 fL (ref 79–97)
Monocytes Absolute: 0.9 10*3/uL (ref 0.1–0.9)
Monocytes: 10 %
Neutrophils Absolute: 5.6 10*3/uL (ref 1.4–7.0)
Neutrophils: 65 %
Platelets: 546 10*3/uL — ABNORMAL HIGH (ref 150–450)
RBC: 3.94 x10E6/uL (ref 3.77–5.28)
RDW: 12.4 % (ref 11.7–15.4)
WBC: 8.7 10*3/uL (ref 3.4–10.8)

## 2022-02-13 LAB — COMPREHENSIVE METABOLIC PANEL
ALT: 20 IU/L (ref 0–32)
AST: 19 IU/L (ref 0–40)
Albumin/Globulin Ratio: 2.3 — ABNORMAL HIGH (ref 1.2–2.2)
Albumin: 4.6 g/dL (ref 3.9–4.9)
Alkaline Phosphatase: 114 IU/L (ref 44–121)
BUN/Creatinine Ratio: 24 (ref 12–28)
BUN: 31 mg/dL — ABNORMAL HIGH (ref 8–27)
Bilirubin Total: 0.4 mg/dL (ref 0.0–1.2)
CO2: 22 mmol/L (ref 20–29)
Calcium: 9.3 mg/dL (ref 8.7–10.3)
Chloride: 101 mmol/L (ref 96–106)
Creatinine, Ser: 1.31 mg/dL — ABNORMAL HIGH (ref 0.57–1.00)
Globulin, Total: 2 g/dL (ref 1.5–4.5)
Glucose: 96 mg/dL (ref 70–99)
Potassium: 5.2 mmol/L (ref 3.5–5.2)
Sodium: 138 mmol/L (ref 134–144)
Total Protein: 6.6 g/dL (ref 6.0–8.5)
eGFR: 44 mL/min/{1.73_m2} — ABNORMAL LOW (ref >59)

## 2022-02-13 LAB — LACTATE DEHYDROGENASE: LDH: 156 IU/L (ref 119–226)

## 2022-02-13 LAB — FERRITIN: Ferritin: 404 ng/mL — ABNORMAL HIGH (ref 15–150)

## 2022-02-18 NOTE — Progress Notes (Signed)
[name],  [id number]   CLINIC:  Medical Oncology/Hematology  PCP:  Maria Moss, Maria Kaufmann, MD Maria Moss [id number] [phone]   REASON FOR VISIT:  Follow-up for normocytic anemia and thrombocytosis   CURRENT THERAPY: Intermittent iron infusions (last Feraheme on 10/26/2019 and 11/02/2019)  INTERVAL HISTORY:   Maria Moss 72 y.o. female returns for routine follow-up of her normocytic anemia and thrombocytosis.  She was last seen in office by Maria Abernethy PA-C on 10/19/2021.  At today's visit, she reports feeling fair.  No recent hospitalizations, surgeries, or changes in baseline health status.   ANEMIA: She has not had any recent "dark and sticky" stools, denies any black bowel movements.  No bright red blood per rectum. She has chronic fatigue which is unchanged. She has mild dyspnea on exertion.  No chest pain, lightheadedness, or syncopal episodes.    THROMBOCYTOSIS: She is taking aspirin 81 mg daily, but reports that it causes increased bruising.  She does not have any history of DVT or PE.  She denies any aquagenic pruritus, Raynaud's phenomenon, or vasomotor symptoms.  She does report that she has had a few episodes "over the years" where her legs will swell, itch, and turn red, which is usually relieved by extra doses of her fluid pills and an unspecified "cream" prescribed by her PCP.   She denies any B symptoms such as fever, chills, night sweats, or unintentional weight loss.  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      She has 40% energy and 75% appetite. She endorses that she is maintaining a stable weight.   ASSESSMENT & PLAN:  1.  Thrombocytosis: - She had thrombocytosis for the last several years. - JAK2 V617F and reflex mutation  testing was negative.  BCR/ABL FISH was negative. - No history of DVT or PE. - Platelet count has been elevated independent of iron levels - Lifelong non-smoker.  No definitive history of autoimmune or inflammatory disease, but was previously told that she "might have lupus or fibromyalgia." - ANA and rheumatoid factor are negative (12/04/2020). - CRP normal.  ESR ranges from normal to mildly elevated at 46. - Bone marrow biopsy (09/11/2021): Hypercellular bone marrow with mild megakaryocytic hyperplasia with mild atypia with otherwise orderly erythroid and myeloid maturation.  Per pathologist, this still could represent a reactive process, the persistence of her thrombocytosis is concerning and repeat BCR/ABL and NexGen myeloid panel were recommended.  Marrow showed increased histiocytic iron stores  Cytogenetics with normal female karyotype 46, XX[20] BCR/ABL negative.  JAK2 V617F negative.  JAK2 exon 12-13 negative.  CALR negative. 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  LDH normal. - PLAN:  We discussed NF1 mutation, which is of uncertain clinical significance.  In some studies of patients with confirmed JAK2 MPN's, additional mutation of NF1 cause increased severity of MPN.  However, little data concerning isolated mutations of NF1 alone. - Per discussion with Dr. Delton Coombes, no indication for cytoreductive therapy at this time.  Can continue active surveillance, but would consider Hydrea if patient had platelet count over 1 million - Continue aspirin 81 mg  daily to decrease risk of VTE in setting of thrombocytosis - Repeat labs (PCP) RTC in 6 months   2.  Anemia of chronic disease +/- iron deficiency -This is a combination of CKD and iron deficiency state. - EGD  (09/20/2019): Gastritis, gastric polyp, hiatal hernia - She had hiatal hernia repair November 2021 - Last Feraheme infusion was on 10/26/2019 and 11/02/2019 -  She reports occasional "dark and sticky" stools, but denies any black bowel movements.  No bright red blood per rectum. - She had a positive Cologuard test on 03/11/2021. - Colonoscopy at [facility] on 06/10/2021 - review of results via [place] which showed internal hemorrhoids, few diverticula and nonspecific bulbous ileocecal valve which was biopsied.  I am not able to see pathology results. - She complains of significant fatigue, which is chronic - Most recent labs (02/12/2022): Hgb 11.7/MCV 89, ferritin 404, iron saturation 35 %.  Creatinine 1.31/GFR 44 (baseline CKD).  LDH normal. - PLAN: No anemia or iron deficiency at this time.  No intervention needed.  Repeat labs and RTC in 6 months   3.  Vitamin B12 deficiency: - She was previously taking B12 cyanocobalamin 1 mg daily, stopped several years ago  - Most recent B12 (10/12/2021) is normal at 663 with normal MMA. - PLAN: No need for supplementation at this time.  Recheck B12 and methylmalonic acid annually (will check at next visit)   4.  Papillary thyroid cancer, follicular variant - Resection of cancerous thyroid nodule in May 2021 - Followed by Dr. Renne Crigler, who discussed with patient that her thyroid cancer is low risk since it was smaller than 2 cm and without any signs of extension or invasion in the lymphovascular system. - Per Dr. Renne Crigler, no additional treatment was necessary, and she has been following the patient with annual ultrasounds. - New left thyroid nodule found to be benign per FNA biopsy in March 2023. - PLAN: Continue follow-up with Dr. Renne Crigler   5.  Primary hyperparathyroidism -Patient has history of hypercalcemia, which was found to be primary hypercalcemia - Calcium decreased to normal after surgery (May 2021) - PLAN: Continue follow-up with Dr.  Renne Crigler  PLAN SUMMARY: >> Labs in 6 months (CBC/D, CMP, LDH, ferritin, iron/TIBC, B12, MMA) - to be drawn at [facility] ([facility]) >> PHONE visit in 6 months (1 week after labs)     REVIEW OF SYSTEMS:   Review of Systems  Constitutional:  Positive for fatigue. Negative for appetite change, chills, diaphoresis, fever and unexpected weight change.  HENT:   Negative for lump/mass and nosebleeds.   Eyes:  Negative for eye problems.  Respiratory:  Positive for shortness of breath (with exertion). Negative for cough and hemoptysis.   Cardiovascular:  Positive for leg swelling. Negative for chest pain and palpitations.  Gastrointestinal:  Positive for constipation (occasional, relieved by Miralax), diarrhea and nausea (occaisonal). Negative for abdominal pain, blood in stool and vomiting.  Genitourinary:  Negative for hematuria.   Musculoskeletal:  Positive for arthralgias, back pain and gait problem (ambulates with cane).  Skin: Negative.   Neurological:  Positive for gait problem (ambulates with cane). Negative for dizziness, headaches and light-headedness.  Hematological:  Does not bruise/bleed easily.  Psychiatric/Behavioral:  Positive for sleep disturbance.      PHYSICAL EXAM:  ECOG PERFORMANCE STATUS: 1 - Symptomatic but completely ambulatory  There were no vitals filed for this visit. There were no vitals filed for this visit. Physical Exam Constitutional:      Appearance: Normal  appearance. She is obese.  HENT:     Head: Normocephalic and atraumatic.     Mouth/Throat:     Mouth: Mucous membranes are moist.  Eyes:     Extraocular Movements: Extraocular movements intact.     Pupils: Pupils are equal, round, and reactive to light.  Cardiovascular:     Rate and Rhythm: Normal rate and regular rhythm.     Pulses: Normal pulses.     Heart sounds: Normal heart sounds.  Pulmonary:     Effort: Pulmonary effort is normal.     Breath sounds: Normal breath sounds.  Abdominal:     General:  Bowel sounds are normal.     Palpations: Abdomen is soft.     Tenderness: There is no abdominal tenderness.  Musculoskeletal:        General: No swelling.     Right lower leg: Edema (trace) present.     Left lower leg: Edema (trace) present.  Lymphadenopathy:     Cervical: No cervical adenopathy.  Skin:    General: Skin is warm and dry.  Neurological:     General: No focal deficit present.     Mental Status: She is alert and oriented to person, place, and time.     Gait: Gait abnormal (antalgic gait, ambulates with cane).  Psychiatric:        Mood and Affect: Mood normal.        Behavior: Behavior normal.     PAST MEDICAL/SURGICAL HISTORY:  Past Medical History:  Diagnosis Date   Anemia    iron deficiency   Ankle fracture, left 2006   Diverticula, intestine    Mild case.   Fibromyalgia    Dr. Justine Null Dx in [place] years ago  no meds    GERD (gastroesophageal reflux disease)    History of hiatal hernia    Hyperlipidemia    Hyperparathyroidism ([place])    Hypertension    Dx age 84.    Stroke [facility])    Questionable stoke when born or polio not sure pt. has right side weakness toes right foot turns outward and toes curled up some    Past Surgical History:  Procedure Laterality Date   ANKLE SURGERY Left    Broken.  Has screws and metal plate.   CHOLECYSTECTOMY     PARATHYROIDECTOMY Right 05/29/2019   Procedure: RIGHT INFERIOR PARATHYROIDECTOMY;  Surgeon: Armandina Gemma, MD;  Location: [facility];  Service: General;  Laterality: Right;   ROBOTIC ADRENALECTOMY Right 05/31/2018   Procedure: XI ROBOTIC RIGHT ADRENALECTOMY;  Surgeon: Ralene Ok, MD;  Location: [facility];  Service: General;  Laterality: Right;   THYROID LOBECTOMY Right 05/29/2019   Procedure: RIGHT THYROID LOBECTOMY;  Surgeon: Armandina Gemma, MD;  Location: [facility];  Service: General;  Laterality: Right;    SOCIAL HISTORY:  Social History   Socioeconomic History   Marital status: Married    Spouse name: Not on file    Number of children: Not on file   Years of education: Not on file   Highest education level: Not on file  Occupational History   Not on file  Tobacco Use   Smoking status: Never   Smokeless tobacco: Never  Vaping Use   Vaping Use: Never used  Substance and Sexual Activity   Alcohol use: No   Drug use: No   Sexual activity: Not Currently  Other Topics Concern   Not on file  Social History Narrative   Not on file   Social Determinants of  Health   Financial Resource Strain: Low Risk  (01/26/2022)   Overall Financial Resource Strain (CARDIA)    Difficulty of Paying Living Expenses: Not hard at all  Food Insecurity: No Food Insecurity (01/26/2022)   Hunger Vital Sign    Worried About Running Out of Food in the Last Year: Never true    Ran Out of Food in the Last Year: Never true  Transportation Needs: No Transportation Needs (01/26/2022)   PRAPARE - Hydrologist (Medical): No    Lack of Transportation (Non-Medical): No  Physical Activity: Insufficiently Active (01/26/2022)   Exercise Vital Sign    Days of Exercise per Week: 5 days    Minutes of Exercise per Session: 20 min  Stress: No Stress Concern Present (01/26/2022)   [place]    Feeling of Stress : Not at all  Social Connections: [place] (01/26/2022)   Social Connection and Isolation Panel [NHANES]    Frequency of Communication with Friends and Family: More than three times a week    Frequency of Social Gatherings with Friends and Family: More than three times a week    Attends Religious Services: More than 4 times per year    Active Member of [employer] or Organizations: Yes    Attends Music therapist: More than 4 times per year    Marital Status: Married  Human resources officer Violence: Not At Risk (01/26/2022)   Humiliation, Afraid, Rape, and Kick questionnaire    Fear of Current or Ex-Partner: No     Emotionally Abused: No    Physically Abused: No    Sexually Abused: No    FAMILY HISTORY:  Family History  Problem Relation Age of Onset   Heart disease Mother        CHF   Cancer Mother        Uterine   Osteoporosis Mother    COPD Sister    Asthma Sister    Migraines Sister    Breast cancer Neg Hx     CURRENT MEDICATIONS:  Outpatient Encounter Medications as of 02/19/2022  Medication Sig Note   acetaminophen (TYLENOL) 500 MG tablet Take by mouth.    alendronate (FOSAMAX) 70 MG tablet Take 1 tablet (70 mg total) by mouth every 7 (seven) days. Take with a full glass of water on an empty stomach.    alum & mag hydroxide-simeth (MAALOX PLUS) 400-400-40 MG/5ML suspension Take by mouth.    aspirin EC 81 MG tablet Take 81 mg by mouth daily. Swallow whole.    cetirizine (ZYRTEC) 10 MG tablet Take 10 mg by mouth daily.    Cholecalciferol 25 MCG (1000 UT) tablet Take 3,000 Units by mouth daily.     dicyclomine (BENTYL) 20 MG tablet Take 20 mg by mouth every 6 (six) hours.    DULoxetine (CYMBALTA) 60 MG capsule TAKE 1 CAPSULE EVERY DAY    ezetimibe (ZETIA) 10 MG tablet Take 1 tablet (10 mg total) by mouth at bedtime.    famotidine (PEPCID) 40 MG tablet one half tablet (20 mg dose) every morning.    FIBER PO Take by mouth.    fluticasone (FLONASE) 50 MCG/ACT nasal spray USE 2 SPRAYS IN EACH NOSTRIL EVERY DAY    furosemide (LASIX) 20 MG tablet TAKE 1 TABLET EVERY DAY    lisinopril (ZESTRIL) 20 MG tablet Take 1 tablet (20 mg total) by mouth daily.    loperamide (IMODIUM A-D) 2 MG  tablet Take 1 tablet (2 mg total) by mouth 4 (four) times daily as needed for diarrhea or loose stools.    loratadine (CLARITIN) 10 MG tablet Take 10 mg by mouth daily. 04/16/2019: Alternates between zyrtec and claritin   meloxicam (MOBIC) 15 MG tablet TAKE 1 TABLET EVERY DAY    Omega-3 Fatty Acids (FISH OIL) 1000 MG CAPS Take 1,000 mg by mouth daily.     ondansetron (ZOFRAN) 4 MG tablet Take 1 tablet (4 mg total)  by mouth every 8 (eight) hours as needed for nausea or vomiting.    pantoprazole (PROTONIX) 40 MG tablet Take 1 tablet (40 mg total) by mouth daily.    Probiotic Product (PROBIOTIC & ACIDOPHILUS EX ST PO) Take 1 capsule by mouth at bedtime.     traMADol (ULTRAM) 50 MG tablet Take 1 tablet (50 mg total) by mouth every 8 (eight) hours as needed (mild pain).    No facility-administered encounter medications on file as of 02/19/2022.    ALLERGIES:  Allergies  Allergen Reactions   Codeine Nausea And Vomiting   Sulfa Antibiotics     Hair loss, yellowed skin   Doxycycline Hyclate Itching    LABORATORY DATA:  I have reviewed the labs as listed.  CBC    Component Value Date/Time   WBC 8.7 02/12/2022 1136   WBC 7.6 09/11/2021 0757   RBC 3.94 02/12/2022 1136   RBC 4.01 09/11/2021 0757   HGB 11.7 02/12/2022 1136   HCT 35.2 02/12/2022 1136   PLT 546 (H) 02/12/2022 1136   MCV 89 02/12/2022 1136   MCH 29.7 02/12/2022 1136   MCH 30.4 09/11/2021 0757   MCHC 33.2 02/12/2022 1136   MCHC 32.8 09/11/2021 0757   RDW 12.4 02/12/2022 1136   LYMPHSABS 1.7 02/12/2022 1136   MONOABS 0.8 09/11/2021 0757   EOSABS 0.4 02/12/2022 1136   BASOSABS 0.0 02/12/2022 1136      Latest Ref Rng & Units 02/12/2022   11:32 AM 12/04/2021   10:38 AM 06/11/2021   11:10 AM  CMP  Glucose 70 - 99 mg/dL 96  94  100   BUN 8 - 27 mg/dL 31  29  23   $ Creatinine 0.57 - 1.00 mg/dL 1.31  1.36  1.18   Sodium 134 - 144 mmol/L 138  137  139   Potassium 3.5 - 5.2 mmol/L 5.2  5.1  5.1   Chloride 96 - 106 mmol/L 101  100  101   CO2 20 - 29 mmol/L 22  21  21   $ Calcium 8.7 - 10.3 mg/dL 9.3  9.1  9.7   Total Protein 6.0 - 8.5 g/dL 6.6  6.7    Total Bilirubin 0.0 - 1.2 mg/dL 0.4  0.4    Alkaline Phos 44 - 121 IU/L 114  124    AST 0 - 40 IU/L 19  20    ALT 0 - 32 IU/L 20  16      DIAGNOSTIC IMAGING:  I have independently reviewed the relevant imaging and discussed with the patient.   WRAP UP:  All questions were answered.  The patient knows to call the clinic with any problems, questions or concerns.  Medical decision making: Moderate  Time spent on visit: I spent 20 minutes counseling the patient face to face. The total time spent in the appointment was 30 minutes and more than 50% was on counseling.  Maria Rush, PA-C  02/19/22 9:09 AM

## 2022-02-19 ENCOUNTER — Inpatient Hospital Stay: Payer: Medicare HMO | Attending: Physician Assistant | Admitting: Physician Assistant

## 2022-02-19 VITALS — BP 139/73 | HR 74 | Temp 97.7°F | Resp 18 | Ht 63.0 in | Wt 148.2 lb

## 2022-02-19 DIAGNOSIS — Z79899 Other long term (current) drug therapy: Secondary | ICD-10-CM | POA: Diagnosis not present

## 2022-02-19 DIAGNOSIS — R0609 Other forms of dyspnea: Secondary | ICD-10-CM | POA: Diagnosis not present

## 2022-02-19 DIAGNOSIS — E210 Primary hyperparathyroidism: Secondary | ICD-10-CM | POA: Diagnosis not present

## 2022-02-19 DIAGNOSIS — Z7982 Long term (current) use of aspirin: Secondary | ICD-10-CM | POA: Diagnosis not present

## 2022-02-19 DIAGNOSIS — E538 Deficiency of other specified B group vitamins: Secondary | ICD-10-CM

## 2022-02-19 DIAGNOSIS — D75839 Thrombocytosis, unspecified: Secondary | ICD-10-CM | POA: Insufficient documentation

## 2022-02-19 DIAGNOSIS — D649 Anemia, unspecified: Secondary | ICD-10-CM | POA: Insufficient documentation

## 2022-02-19 DIAGNOSIS — Z85850 Personal history of malignant neoplasm of thyroid: Secondary | ICD-10-CM | POA: Insufficient documentation

## 2022-02-19 DIAGNOSIS — D509 Iron deficiency anemia, unspecified: Secondary | ICD-10-CM | POA: Diagnosis not present

## 2022-02-19 NOTE — Patient Instructions (Addendum)
[facility] [place] **   You were seen today by Tarri Abernethy PA-C for your elevated platelets.    ELEVATED PLATELETS Your bone marrow biopsy did not show any obvious sign of blood cancer or leukemia.  It did show some mild abnormalities in your pre-platelet cells.  These abnormalities are not necessarily associated with any specific diagnosis.  They could indicate early bone marrow abnormalities, or could simply be present as a reaction to chronic disease and inflammation. Your genetic testing did not show any genetic mutations that are causative of blood disorders.  You did have a positive mutation for "NF1," which may or may not be associated with increased blood counts in some patients (there is limited data on this at present). You do not need any treatment of your elevated platelets, other than taking aspirin 81 mg daily. We will continue to check your blood counts and platelets periodically, and if you have significantly elevated platelets in the future, we would start you on medication to decrease your platelets.  LABS: Check labs in 6 months your primary care doctor's office.  FOLLOW-UP APPOINTMENT: Phone visit in 6 months, 1 week after labs  ** Thank you for trusting me with your healthcare!  I strive to provide all of my patients with quality care at each visit.  If you receive a survey for this visit, I would be so grateful to you for taking the time to provide feedback.  Thank you in advance!  ~ Shaquinta Peruski                   Dr. Derek Jack   &   Tarri Abernethy, PA-C   - - - - - - - - - - - - - - - - - -    Thank you for choosing [place] [facility] to provide your oncology and hematology care.  To afford each patient quality time with our provider, please arrive at least 15 minutes before your scheduled appointment time.   If you have a lab appointment with the [facility] please come in thru the Main Entrance and check in at the main information desk.  You need to re-schedule your appointment should you arrive 10 or more minutes late.  We strive to give you quality time with our providers, and arriving late affects you and other patients whose appointments are after yours.  Also, if you no show three or more times for appointments you may be dismissed from the clinic at the providers discretion.     Again, thank you for choosing [facility]  Our hope is that these requests will decrease the amount of time that you wait before being seen by our physicians.       _____________________________________________________________  Should you have questions after your visit to [facility], please contact our office at [phone] and follow the prompts.  Our office hours are 8:00 a.m. and 4:30 p.m. Monday - Friday.  Please note that voicemails left after 4:00 p.m. may not be returned until the following business day.  We are closed weekends and major holidays.  You do have access to a nurse 24-7, just call the main number to the clinic [phone] and do not press any options, hold on the line and a nurse will answer the phone.    For prescription refill requests, have your pharmacy contact our office  and allow 72 hours.

## 2022-02-22 ENCOUNTER — Other Ambulatory Visit: Payer: Self-pay

## 2022-02-22 DIAGNOSIS — E538 Deficiency of other specified B group vitamins: Secondary | ICD-10-CM

## 2022-02-22 DIAGNOSIS — D509 Iron deficiency anemia, unspecified: Secondary | ICD-10-CM

## 2022-02-22 DIAGNOSIS — D75839 Thrombocytosis, unspecified: Secondary | ICD-10-CM

## 2022-02-22 DIAGNOSIS — D638 Anemia in other chronic diseases classified elsewhere: Secondary | ICD-10-CM

## 2022-02-26 ENCOUNTER — Encounter: Payer: Self-pay | Admitting: Internal Medicine

## 2022-02-26 ENCOUNTER — Ambulatory Visit: Payer: Medicare HMO | Admitting: Internal Medicine

## 2022-02-26 VITALS — BP 144/78 | HR 87 | Ht 63.0 in | Wt 148.2 lb

## 2022-02-26 DIAGNOSIS — E890 Postprocedural hypothyroidism: Secondary | ICD-10-CM | POA: Diagnosis not present

## 2022-02-26 DIAGNOSIS — E041 Nontoxic single thyroid nodule: Secondary | ICD-10-CM | POA: Diagnosis not present

## 2022-02-26 DIAGNOSIS — M8580 Other specified disorders of bone density and structure, unspecified site: Secondary | ICD-10-CM | POA: Diagnosis not present

## 2022-02-26 DIAGNOSIS — E278 Other specified disorders of adrenal gland: Secondary | ICD-10-CM | POA: Diagnosis not present

## 2022-02-26 DIAGNOSIS — Z85850 Personal history of malignant neoplasm of thyroid: Secondary | ICD-10-CM | POA: Diagnosis not present

## 2022-02-26 DIAGNOSIS — E559 Vitamin D deficiency, unspecified: Secondary | ICD-10-CM

## 2022-02-26 DIAGNOSIS — E213 Hyperparathyroidism, unspecified: Secondary | ICD-10-CM | POA: Diagnosis not present

## 2022-02-26 LAB — VITAMIN D 25 HYDROXY (VIT D DEFICIENCY, FRACTURES): VITD: 57.04 ng/mL (ref 30.00–100.00)

## 2022-02-26 LAB — TSH: TSH: 2.43 u[IU]/mL (ref 0.35–5.50)

## 2022-02-26 NOTE — Progress Notes (Signed)
Patient ID: Maria Moss, female   DOB: July 10, 1950, 72 y.o.   MRN: [id number]   HPI  Maria Moss is a 72 y.o.-year-old female, initially referred by Dr. Rosendo Gros [facility]), presenting for follow-up for a R adrenal incidentaloma.  She also has a history of primary hyperparathyroidism and a right thyroid nodule which turned out to be a follicular variant of papillary thyroid cancer.  Last visit 1 year ago.  She is here with her husband.  Interim history: She stopped Fosamax last month  - 2/2 AP and D.  She previously held it and then restarted it with recurrence of her symptoms. No falls or fractures since last visit. She waks with a cane 2/2 pain in L leg.  Right adrenal incidentaloma: Reviewed previous imaging studies and investigations: Lumbar MRI (10/28/2017): 5.2 x 4.4 R renal or adrenal heterogeneous soft tissue mass  CT abdomen w/ and w/o contrast (11/04/2017): R adrenal 5 cm complex necrotic appearing and enhancing mass with nodular areas of contrast enhancement and scattered areas of calcification >> possible RCC vs. metastasis  PET CT (11/18/2017):  CHEST: No hypermetabolic mediastinal or hilar nodes. No suspicious pulmonary nodules on the CT scan. ABDOMEN/PELVIS: Large RIGHT renal mass again noted measuring 5.0 by 4.5 cm. Mass has central photopenia and mild peripheral metabolic activity with SUV max equal 3.1 (along the medial border of the mass for example.) This mild activity is similar to liver activity (SUV max equal 3.8). The LEFT adrenal glands normal. No other sites of abnormal FDG uptake.  IMPRESSION: 1. Large RIGHT adrenal mass with central photopenia and mild peripheral metabolic activity. Findings are nonspecific. Recommend surgical consultation for lesions of this size. 2. No evidence of primary or metastatic carcinoma outside of the adrenal gland.  Of note, she had normal testosterone, estradiol, 24-hour urine cortisol and 24-hour urine metanephrines and  catecholamines before the surgery.  After the surgery, a cosyntropin stimulation test was normal.  Please review my last office visit note for more details of the hormonal investigation.  She had right adrenalectomy by Dr. 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  She feels well after surgery.  A cosyntropin stimulation test was normal after surgery and cortisol level remained normal afterwards: Component     Latest Ref Rng & Units 09/01/2018 09/01/2018 09/01/2018 02/02/2019         9:45 AM 10:30 AM 10:58 AM   Cortisol, Plasma     ug/dL 12.3 19.7 22.4 13.3   Vitamin D deficiency:  Reviewed vitamin D levels: Lab Results  Component Value Date   VD25OH 57.54 01/23/2021   VD25OH 50.4 08/08/2020   VD25OH 50.2 07/20/2019   VD25OH 73.31 02/02/2019   VD25OH 62.95 07/28/2018   VD25OH 29.1 (L) 04/14/2017   VD25OH 43.9 11/02/2013  She was on 3000 units vitamin D daily, now 4000 IU daily, dose increased 12/2019.  Primary hyperparathyroidism:  Reviewed pertinent labs: Lab Results  Component Value Date   PTH 52 07/20/2019   PTH Comment 07/20/2019   PTH 46 03/05/2019   PTH 54 02/02/2019   PTH Comment 02/02/2019   PTH 41 11/10/2017   PTH Comment 11/10/2017   Lab Results  Component Value Date   CALCIUM 9.3 02/12/2022   CALCIUM 9.1 12/04/2021   CALCIUM 9.7 06/11/2021   CALCIUM 9.7 06/11/2021   CALCIUM 9.9 05/15/2021   CALCIUM 9.7 01/23/2021   CALCIUM 9.6 12/01/2020   CALCIUM 9.3 08/08/2020   CALCIUM 9.5 01/18/2020   CALCIUM  9.3 08/23/2019   CALCIUM 9.7 08/10/2019   CALCIUM 9.6 07/20/2019   CALCIUM 8.7 (L) 05/30/2019   CALCIUM 10.0 05/25/2019   CALCIUM 10.6 (H) 04/16/2019   CALCIUM 11.0 (H) 03/05/2019   CALCIUM 10.4 (H) 02/13/2019   CALCIUM 10.5 (H) 02/02/2019   CALCIUM 10.0 10/13/2018   CALCIUM 10.1 08/24/2018   CALCIUM 10.3 08/17/2018   CALCIUM 8.6 (L) 06/01/2018   CALCIUM 9.9 05/22/2018   CALCIUM 10.3 01/27/2018   CALCIUM 10.5 (H) 11/10/2017    CALCIUM 10.4 (H) 10/21/2017   CALCIUM 10.5 (H) 06/02/2017   CALCIUM 10.4 (H) 04/14/2017   CALCIUM 10.4 (H) 12/16/2016   CALCIUM 10.5 (H) 09/24/2016   CALCIUM 10.6 (H) 09/10/2016   CALCIUM 10.2 06/11/2016   CALCIUM 10.3 03/05/2016   CALCIUM 9.8 11/28/2015   CALCIUM 10.3 08/29/2015   CALCIUM 10.1 03/21/2015   CALCIUM 10.4 (H) 11/29/2014   CALCIUM 9.9 05/17/2014   CALCIUM 10.3 02/15/2014   CALCIUM 10.3 07/19/2013   PCP suggested to start back on calcium and vitamin D after the bone density results returned 04/2020.  Technetium sestamibi scan (05/03/2019): RIGHT inferior parathyroid adenoma.   Cannot exclude coexistent parathyroid adenoma at the RIGHT superior parathyroid gland.   Potential cold nodule within the RIGHT thyroid lobe near upper pole; consider follow-up thyroid ultrasound assessment to further evaluate.  Thyroid U/S (05/11/2019): Parenchymal Echotexture: Moderately heterogenous Isthmus: 4 mm Right lobe: 6.1 x 2.2 x 3.1 cm Left lobe: 5.1 x 1.2 x 1.6 cm  _______________________________________________________   Estimated total number of nodules >/= 1 cm: 1 _____________________________________________   Nodule # 1: Location: Right; Mid Maximum size: 2.2 cm; Other 2 dimensions: 2.0 x 1.8 cm Composition: spongiform (0) Echogenicity: isoechoic (1) This nodule does NOT meet TI-RADS criteria for biopsy or dedicated follow-up. _________________________________________________________   Moderate thyroid heterogeneity with mixed echogenicity and background pseudo nodularity. No hypervascularity. No regional adenopathy.   IMPRESSION: 2.2 cm right mid thyroid TR 1 spongiform nodule correlates with the cold nodule by the parathyroid scan. This would not meet criteria for any biopsy or follow-up.   Nonspecific thyroid heterogeneity.  She had right inferior parathyroidectomy + right thyroid lobectomy (05/29/2019)-Dr. Gerkin.  Pathology showed a parathyroid adenoma  and calcium decreased from 11 preop to 9.7 postop  (06/11/2019).  PTH postop was 51.  However, surprisingly, pathology of the right thyroid nodule showed  follicular variant of papillary thyroid cancer, measuring 1.7 cm, with negative surgical margins.  We did not proceed with RAI treatment.  Thyroid U/S (08/29/2020): Prior right-sided hemithyroidectomy. Pathology reports papillary thyroid carcinoma follicular variant  Parenchymal Echotexture: Mildly heterogenous Isthmus: 0.2 cm Right lobe: Surgically absent Left lobe: 4.6 cm x 1.4 cm x 1.6 cm _________________________________________________________  Nodule # 1: Location: Left; Inferior Maximum size: 1.9 cm; Other 2 dimensions: 1.2 cm x 1.2 cm Composition: cannot determine (2) Echogenicity: hypoechoic (2) Nodule meets criteria for biopsy _________________________________________________________  Rounded hypoechoic nodule in the left cervical nodal station, presumably a lymph node measuring 1.1 cm in long axis.  IMPRESSION: Surgical changes of right hemithyroidectomy.  Left inferior thyroid nodule meets criteria for biopsy, as designated by the newly established ACR TI-RADS criteria, and referral for biopsy is recommended.  Hypoechoic nodule in the left cervical nodal station, nonspecific though presumably a lymph node, not enlarged.  I suggested biopsy after the above results returned. Unfortunately, the sample was insufficient for analysis: FNA (09/17/2020): Clinical History: Left inferior, 1.9 cm; Other 2 dimensions: 1.2 cm x  1.2 cm, Hypoechoic, TI-RADS  total points: 4  Specimen Submitted:  A. THYROID, LEFT INFERIOR NODULE #1, FINE NEEDLE ASPIRATION:   FINAL MICROSCOPIC DIAGNOSIS:  - Scant follicular epithelium present (Bethesda category I)   FNA (04/01/2021): Clinical History: Left; Inferior 1.9cm; Other 2 dimensions: 1.2cm x  1.2cm cannot determine hypoechoic TI-RADS - 4  Specimen Submitted:  A. THYROID, LEFT  INFERIOR, FINE NEEDLE ASPIRATION:   FINAL MICROSCOPIC DIAGNOSIS:  - Atypia of undetermined significance (Bethesda category III)   Afirma: benign  Pt denies: - feeling nodules in neck - hoarseness  - choking + Some dysphagia  - chronic - occasionally  Osteopenia per review of the DXA scan reports from 2018 and 2019:  DXA scan ([place]) from 04/30/2020: L1-L4: n/a RFN: -2.3 LFN: -1.7 33% distal radius, -1.1% (-2.5%*) FRAX: n/a  DXA scan ([facility]) from 04/14/2017: L1-L4: -1.0 RFN: -1.7 LFN: -1.6 33% distal radius: -0.4 FRAX: 15.8%, 2%  DXA scan ([facility]) from 10/24/2016: L1-L4: -0.5 RFN: -1.0 LFN: -1.2  No history of kidney stones.  Prev. On Fosamax >> stopped 2/2 stomach pain and diarrhea.  ROS: + See HPI  I reviewed pt's medications, allergies, PMH, social hx, family hx, and changes were documented in the history of present illness. Otherwise, unchanged from my initial visit note.  Past Medical History:  Diagnosis Date   Anemia    iron deficiency   Ankle fracture, left 2006   Diverticula, intestine    Mild case.   Fibromyalgia    Dr. Justine Null Dx in [place] years ago  no meds    GERD (gastroesophageal reflux disease)    History of hiatal hernia    Hyperlipidemia    Hyperparathyroidism ([place])    Hypertension    Dx age 89.    Stroke [facility])    Questionable stoke when born or polio not sure pt. has right side weakness toes right foot turns outward and toes curled up some     Past Surgical History:  Procedure Laterality Date   ANKLE SURGERY Left    Broken.  Has screws and metal plate.   CHOLECYSTECTOMY     PARATHYROIDECTOMY Right 05/29/2019   Procedure: RIGHT INFERIOR PARATHYROIDECTOMY;  Surgeon: Armandina Gemma, MD;  Location: [facility];  Service: General;  Laterality: Right;   ROBOTIC ADRENALECTOMY Right 05/31/2018   Procedure: XI ROBOTIC RIGHT ADRENALECTOMY;  Surgeon: Ralene Ok, MD;  Location: [facility];  Service: General;   Laterality: Right;   THYROID LOBECTOMY Right 05/29/2019   Procedure: RIGHT THYROID LOBECTOMY;  Surgeon: Armandina Gemma, MD;  Location: [facility];  Service: General;  Laterality: Right;    Social History   Socioeconomic History   Marital status: Married    Spouse name: Not on file   Number of children: Not on file   Years of education: Not on file   Highest education level: Not on file  Occupational History   Not on file  Tobacco Use   Smoking status: Never   Smokeless tobacco: Never  Vaping Use   Vaping Use: Never used  Substance and Sexual Activity   Alcohol use: No   Drug use: No   Sexual activity: Not Currently  Other Topics Concern   Not on file  Social History Narrative   Not on file   Social Determinants of Health   Financial Resource Strain: Low Risk  (01/26/2022)   Overall Financial Resource Strain (CARDIA)    Difficulty of Paying Living Expenses: Not hard at all  Food Insecurity: No Food Insecurity (01/26/2022)  Hunger Vital Sign    Worried About Running Out of Food in the Last Year: Never true    Ran Out of Food in the Last Year: Never true  Transportation Needs: No Transportation Needs (01/26/2022)   PRAPARE - Hydrologist (Medical): No    Lack of Transportation (Non-Medical): No  Physical Activity: Insufficiently Active (01/26/2022)   Exercise Vital Sign    Days of Exercise per Week: 5 days    Minutes of Exercise per Session: 20 min  Stress: No Stress Concern Present (01/26/2022)   [place]    Feeling of Stress : Not at all  Social Connections: [place] (01/26/2022)   Social Connection and Isolation Panel [NHANES]    Frequency of Communication with Friends and Family: More than three times a week    Frequency of Social Gatherings with Friends and Family: More than three times a week    Attends Religious Services: More than 4 times per year    Active Member  of [employer] or Organizations: Yes    Attends Music therapist: More than 4 times per year    Marital Status: Married  Human resources officer Violence: Not At Risk (01/26/2022)   Humiliation, Afraid, Rape, and Kick questionnaire    Fear of Current or Ex-Partner: No    Emotionally Abused: No    Physically Abused: No    Sexually Abused: No    Current Outpatient Medications on File Prior to Visit  Medication Sig Dispense Refill   acetaminophen (TYLENOL) 500 MG tablet Take by mouth.     alendronate (FOSAMAX) 70 MG tablet Take 1 tablet (70 mg total) by mouth every 7 (seven) days. Take with a full glass of water on an empty stomach. 12 tablet 1   alum & mag hydroxide-simeth (MAALOX PLUS) 400-400-40 MG/5ML suspension Take by mouth.     amLODipine (NORVASC) 5 MG tablet Take 5 mg by mouth daily.     aspirin EC 81 MG tablet Take 81 mg by mouth daily. Swallow whole.     cetirizine (ZYRTEC) 10 MG tablet Take 10 mg by mouth daily.     Cholecalciferol 25 MCG (1000 UT) tablet Take 3,000 Units by mouth daily.      dicyclomine (BENTYL) 20 MG tablet Take 20 mg by mouth every 6 (six) hours.     DULoxetine (CYMBALTA) 60 MG capsule TAKE 1 CAPSULE EVERY DAY 90 capsule 3   ezetimibe (ZETIA) 10 MG tablet Take 1 tablet (10 mg total) by mouth at bedtime. 90 tablet 1   famotidine (PEPCID) 40 MG tablet one half tablet (20 mg dose) every morning.     FIBER PO Take by mouth.     fluticasone (FLONASE) 50 MCG/ACT nasal spray USE 2 SPRAYS IN EACH NOSTRIL EVERY DAY 48 g 1   furosemide (LASIX) 20 MG tablet TAKE 1 TABLET EVERY DAY 90 tablet 3   lisinopril (ZESTRIL) 20 MG tablet Take 1 tablet (20 mg total) by mouth daily. 90 tablet 3   loperamide (IMODIUM A-D) 2 MG tablet Take 1 tablet (2 mg total) by mouth 4 (four) times daily as needed for diarrhea or loose stools. 30 tablet 0   loratadine (CLARITIN) 10 MG tablet Take 10 mg by mouth daily.     meloxicam (MOBIC) 15 MG tablet TAKE 1 TABLET EVERY DAY 90 tablet 3    Omega-3 Fatty Acids (FISH OIL) 1000 MG CAPS Take 1,000 mg by mouth  daily.      ondansetron (ZOFRAN) 4 MG tablet Take 1 tablet (4 mg total) by mouth every 8 (eight) hours as needed for nausea or vomiting. 20 tablet 0   pantoprazole (PROTONIX) 40 MG tablet Take 1 tablet (40 mg total) by mouth daily. 90 tablet 3   Probiotic Product (PROBIOTIC & ACIDOPHILUS EX ST PO) Take 1 capsule by mouth at bedtime.      traMADol (ULTRAM) 50 MG tablet Take 1 tablet (50 mg total) by mouth every 8 (eight) hours as needed (mild pain). 90 tablet 2   No current facility-administered medications on file prior to visit.    Allergies  Allergen Reactions   Codeine Nausea And Vomiting   Sulfa Antibiotics     Hair loss, yellowed skin   Doxycycline Hyclate Itching    Family History  Problem Relation Age of Onset   Heart disease Mother        CHF   Cancer Mother        Uterine   Osteoporosis Mother    COPD Sister    Asthma Sister    Migraines Sister    Breast cancer Neg Hx    PE: BP (!) 144/78 (BP Location: Left Arm, Patient Position: Sitting, Cuff Size: Normal)   Pulse 87   Ht '5\' 3"'$  (1.6 m)   Wt 148 lb 3.2 oz (67.2 kg)   SpO2 95%   BMI 26.25 kg/m  Wt Readings from Last 3 Encounters:  02/26/22 148 lb 3.2 oz (67.2 kg)  02/19/22 148 lb 3.2 oz (67.2 kg)  01/26/22 148 lb (67.1 kg)   Constitutional: normal weight, in NAD Eyes: EOMI, no exophthalmos ENT: no neck masses palpable, no cervical lymphadenopathy Cardiovascular: RRR, No MRG, + B LE edema (wears compression hoses) Respiratory: CTA B Musculoskeletal: no deformities Skin: moist, warm, no rashes Neurological: + Mild tremor with outstretched hands  ASSESSMENT: 1. Adrenal incidentaloma  2.  Primary hyperparathyroidism  3.  Vitamin D deficiency  4.  Follicular variant of papillary thyroid cancer  5. S/p right thyroid lobectomy  6.  Osteopenia  PLAN:  1. Patient with a history of right adrenal nodule discovered incidentally. This had  necrotic areas and scattered areas of calcifications, and measured 5 x 4.5 cm.  The PET CT scan did not show increased FDG uptake to suggest malignancy.  We ruled out adrenal hormone hyper secretion and I cleared her for surgery.  She had mass resected and the pathology was benign (cavernous hemangioma).  After surgery, her right leg pain resolved. -Postsurgical cosyntropin stimulation test was normal, ruling out adrenal insufficiency -No further investigation is needed for this  2.  Primary hyperparathyroidism -Patient with history of hypercalcemia and nonsuppressed PTH, with technetium sestamibi scan positive for a R inf.  adenoma.  This was resected and calcium decreased to normal after surgery. -Calcium and PTH levels were normal. -Latest calcium level was normal: Lab Results  Component Value Date   CALCIUM 9.3 02/12/2022   PHOS 3.6 03/05/2019  -We will not repeat this today  3.  Vitamin D deficiency -On 4000 units vitamin D daily -Latest vitamin D level was normal: Lab Results  Component Value Date   VD25OH 57.54 01/23/2021  -We will repeat this today  4.  Follicular variant of papillary thyroid cancer and 6. L thyroid nodule -her thyroid cancer is low risk since it was smaller than 2 cm and without any signs of extension or invasion in the lymphovascular system -Because of the low  risk of thyroid cancer, completion thyroidectomy was not mandatory -No neck masses on palpation of her neck today and no neck compression symptoms -On the neck ultrasound from 08/2020 she had a left thyroid nodule, which we tried to biopsy with inconclusive results in the past due to insufficient sample -The biopsy was uncomfortable and she still had some sensitivity at the site at last visit.  This resolved. -However, she agreed to repeat another FNA and she had this in 04/2021.  The results were inconclusive, however, the Afirma molecular marker returned benign -Will plan to repeat another ultrasound  next year  6. S/p right thyroid lobectomy -Latest TSH was normal: Lab Results  Component Value Date   TSH 2.79 01/23/2021  -We will repeat this today.  She took Hair skin and nails vitamin this morning with 600 mcg biotin.  We discussed about not taking this before last, but for now we will go ahead and check her TSH.  We may need to repeat it if the TSH returned suppressed. -No hypothyroid symptoms -We did discuss about how to take levothyroxine in case she needs to start: Every day, with water, at least 30 minutes before breakfast, separated by at least 4 hours from: - acid reflux medications - calcium - iron - multivitamins  7.  Osteopenia -Reviewed the DXA scan report from 2018, 2019 and 04/30/2020.  On the scan from 04/30/2020, the right femoral neck bone density was significantly worse, as well as 33% distal radius BMD.  We discussed about options for treatment at last visit.  I advised her that the bone density will most likely improve after parathyroid surgery, but I also suggested oral alendronate once a week and discussed how to take it correctly.   -We started alendronate after our visit in 08/2020 she developed abdominal pain and diarrhea and had to stop 2 months ago.  She then tried to restart it but the symptoms recurred and she stopped it completely approximately 1 month ago. - we discussed about using Reclast versus Prolia.  Discussed about differences.  We decided to continue with Reclast due to convenience.  I will order this today. -Plan to repeat the DXA scan this year, in 04/2022 - will have it at [facility]. -I will see her back in a year  Component     Latest Ref Rng 02/26/2022  TSH     0.35 - 5.50 uIU/mL 2.43   VITD     30.00 - 100.00 ng/mL 57.04   TSH and vitamin D level are normal.  Philemon Kingdom, MD PhD [facility]

## 2022-02-26 NOTE — Patient Instructions (Signed)
Please stop at the lab.  If we need to start thyroid hormone (Levothyroxine), Take it every day, with water, at least 30 minutes before breakfast, separated by at least 4 hours from: - acid reflux medications - calcium - iron - multivitamins  Continue vitamin D 4000 units daily.  Will try to schedule [facility].  Let's get a new Bone Density Scan in 04/2022.  Please come back for a follow-up appointment in 1 year.

## 2022-03-05 ENCOUNTER — Ambulatory Visit (INDEPENDENT_AMBULATORY_CARE_PROVIDER_SITE_OTHER): Payer: Medicare HMO | Admitting: Family Medicine

## 2022-03-05 ENCOUNTER — Encounter: Payer: Self-pay | Admitting: Family Medicine

## 2022-03-05 VITALS — BP 123/71 | HR 71 | Ht 63.0 in | Wt 148.0 lb

## 2022-03-05 DIAGNOSIS — F411 Generalized anxiety disorder: Secondary | ICD-10-CM | POA: Diagnosis not present

## 2022-03-05 DIAGNOSIS — M5136 Other intervertebral disc degeneration, lumbar region: Secondary | ICD-10-CM

## 2022-03-05 DIAGNOSIS — I10 Essential (primary) hypertension: Secondary | ICD-10-CM | POA: Diagnosis not present

## 2022-03-05 DIAGNOSIS — E782 Mixed hyperlipidemia: Secondary | ICD-10-CM | POA: Diagnosis not present

## 2022-03-05 MED ORDER — TRAMADOL HCL 50 MG PO TABS: 50.0000 mg | ORAL_TABLET | Freq: Three times a day (TID) | ORAL | 2 refills | Status: DC | PRN

## 2022-03-05 NOTE — Progress Notes (Signed)
BP 123/71   Pulse 71   Ht '5\' 3"'$  (1.6 m)   Wt 148 lb (67.1 kg)   SpO2 97%   BMI 26.22 kg/m    Subjective:   Patient ID: Maria Moss, female    DOB: 11/16/1950, 72 y.o.   MRN: [id number]  HPI: Maria Moss is a 72 y.o. female presenting on 03/05/2022 for Medical Management of Chronic Issues, Hyperlipidemia, Hypertension, and Anxiety   HPI Pain assessment: Cause of pain-degenerative disc disease lumbar Pain location-lower back Pain on scale of 1-10- 6 Frequency-daily What increases pain-movement standing sitting What makes pain Better-tramadol rest Effects on ADL -minimal at this point Any change in general medical condition-none  Current opioids rx-tramadol 50 mg 3 times daily as needed # meds rx-90/month Effectiveness of current meds-works well Adverse reactions from pain meds-none currently Morphine equivalent-15  Pill count performed-No Last drug screen -12/17/2021 ( high risk q62m moderate risk q6110mlow risk yearly ) Urine drug screen today- No Was the [place]yes  If yes were their any concerning findings? -None Pain contract signed on: 12/17/2021  Hyperlipidemia Patient is coming in for recheck of his hyperlipidemia. The patient is currently taking Zetia and fish oil. They deny any issues with myalgias or history of liver damage from it. They deny any focal numbness or weakness or chest pain.   Hypertension Patient is currently on amlodipine and lisinopril, and their blood pressure today is 123/71. Patient denies any lightheadedness or dizziness. Patient denies headaches, blurred vision, chest pains, shortness of breath, or weakness. Denies any side effects from medication and is content with current medication.    Anxiety disorder recheck Patient is coming in today for anxiety recheck and she currently takes Cymbalta.  Feels like her anxiety is doing pretty well.  Her mood is well and she is pretty content with that.  She continues to work with cancer center  docs and her endocrinologist.    03/05/2022   10:19 AM 01/26/2022    3:41 PM 12/04/2021   10:21 AM 08/21/2021   10:14 AM 08/21/2021   10:13 AM  Depression screen PHQ 2/9  Decreased Interest 0 0 0  0  Down, Depressed, Hopeless 0 0 0  0  PHQ - 2 Score 0 0 0  0  Altered sleeping 0 0 2 1   Tired, decreased energy 3 0 3 3   Change in appetite 0 0 0 0   Feeling bad or failure about yourself  0 0 0 0   Trouble concentrating 0 0 0 0   Moving slowly or fidgety/restless 0 0 0 0   Suicidal thoughts 0 0 0 0   PHQ-9 Score 3 0 5    Difficult doing work/chores Not difficult at all Not difficult at all Not difficult at all Not difficult at all      Relevant past medical, surgical, family and social history reviewed and updated as indicated. Interim medical history since our last visit reviewed. Allergies and medications reviewed and updated.  Review of Systems  Constitutional:  Negative for chills and fever.  Eyes:  Negative for visual disturbance.  Respiratory:  Negative for chest tightness and shortness of breath.   Cardiovascular:  Negative for chest pain and leg swelling.  Musculoskeletal:  Positive for arthralgias and back pain. Negative for gait problem.  Skin:  Negative for rash.  Neurological:  Negative for light-headedness and headaches.  Psychiatric/Behavioral:  Negative for agitation and behavioral problems.   All other systems  reviewed and are negative.   Per HPI unless specifically indicated above   Allergies as of 03/05/2022       Reactions   Codeine Nausea And Vomiting   Sulfa Antibiotics    Hair loss, yellowed skin   Doxycycline Hyclate Itching        Medication List        Accurate as of March 05, 2022 10:40 AM. If you have any questions, ask your nurse or doctor.          STOP taking these medications    alendronate 70 MG tablet Commonly known as: FOSAMAX Stopped by: Fransisca Kaufmann Mang Hazelrigg, MD       TAKE these medications    acetaminophen 500 MG  tablet Commonly known as: TYLENOL Take by mouth.   alum & mag hydroxide-simeth [id number] MG/5ML suspension Commonly known as: MAALOX PLUS Take by mouth.   amLODipine 5 MG tablet Commonly known as: NORVASC Take 5 mg by mouth daily.   aspirin EC 81 MG tablet Take 81 mg by mouth daily. Swallow whole.   cetirizine 10 MG tablet Commonly known as: ZYRTEC Take 10 mg by mouth daily.   Cholecalciferol 25 MCG (1000 UT) tablet Take 3,000 Units by mouth daily.   dicyclomine 20 MG tablet Commonly known as: BENTYL Take 20 mg by mouth every 6 (six) hours.   DULoxetine 60 MG capsule Commonly known as: CYMBALTA TAKE 1 CAPSULE EVERY DAY   ezetimibe 10 MG tablet Commonly known as: Zetia Take 1 tablet (10 mg total) by mouth at bedtime.   famotidine 40 MG tablet Commonly known as: PEPCID one half tablet (20 mg dose) every morning.   FIBER PO Take by mouth.   Fish Oil 1000 MG Caps Take 1,000 mg by mouth daily.   fluticasone 50 MCG/ACT nasal spray Commonly known as: FLONASE USE 2 SPRAYS IN EACH NOSTRIL EVERY DAY   furosemide 20 MG tablet Commonly known as: LASIX TAKE 1 TABLET EVERY DAY   lisinopril 20 MG tablet Commonly known as: ZESTRIL Take 1 tablet (20 mg total) by mouth daily.   loperamide 2 MG tablet Commonly known as: Imodium A-D Take 1 tablet (2 mg total) by mouth 4 (four) times daily as needed for diarrhea or loose stools.   loratadine 10 MG tablet Commonly known as: CLARITIN Take 10 mg by mouth daily.   meloxicam 15 MG tablet Commonly known as: MOBIC TAKE 1 TABLET EVERY DAY   ondansetron 4 MG tablet Commonly known as: Zofran Take 1 tablet (4 mg total) by mouth every 8 (eight) hours as needed for nausea or vomiting.   pantoprazole 40 MG tablet Commonly known as: PROTONIX Take 1 tablet (40 mg total) by mouth daily.   PROBIOTIC & ACIDOPHILUS EX ST PO Take 1 capsule by mouth at bedtime.   traMADol 50 MG tablet Commonly known as: ULTRAM Take 1 tablet (50  mg total) by mouth every 8 (eight) hours as needed (mild pain).         Objective:   BP 123/71   Pulse 71   Ht '5\' 3"'$  (1.6 m)   Wt 148 lb (67.1 kg)   SpO2 97%   BMI 26.22 kg/m   Wt Readings from Last 3 Encounters:  03/05/22 148 lb (67.1 kg)  02/26/22 148 lb 3.2 oz (67.2 kg)  02/19/22 148 lb 3.2 oz (67.2 kg)    Physical Exam Vitals and nursing note reviewed.  Constitutional:      General: She is not in  acute distress.    Appearance: She is well-developed. She is not diaphoretic.  Eyes:     Conjunctiva/sclera: Conjunctivae normal.  Cardiovascular:     Rate and Rhythm: Normal rate and regular rhythm.     Heart sounds: Normal heart sounds. No murmur heard. Pulmonary:     Effort: Pulmonary effort is normal. No respiratory distress.     Breath sounds: Normal breath sounds. No wheezing.  Musculoskeletal:        General: No swelling. Normal range of motion.  Skin:    General: Skin is warm and dry.     Findings: No rash.  Neurological:     Mental Status: She is alert and oriented to person, place, and time.     Coordination: Coordination normal.  Psychiatric:        Behavior: Behavior normal.       Assessment & Plan:   Problem List Items Addressed This Visit       Cardiovascular and Mediastinum   Hypertension (Chronic)     Musculoskeletal and Integument   Degenerative disc disease, lumbar (Chronic)   Relevant Medications   traMADol (ULTRAM) 50 MG tablet (Start on 04/04/2022)     Other   Hyperlipemia - Primary (Chronic)   GAD (generalized anxiety disorder) (Chronic)    Will continue tramadol, no change in medication.  Patient can work with her endocrinologist to do a different bone density medicine instead of the Fosamax because she did not tolerate it. Follow up plan: Return in about 3 months (around 06/05/2022), or if symptoms worsen or fail to improve, for Hypertension and hyperlipidemia and anxiety degenerative disc disease.  Counseling provided for all of  the vaccine components No orders of the defined types were placed in this encounter.   Caryl Pina, MD [facility] 03/05/2022, 10:40 AM

## 2022-03-10 DIAGNOSIS — Z8719 Personal history of other diseases of the digestive system: Secondary | ICD-10-CM | POA: Diagnosis not present

## 2022-03-10 DIAGNOSIS — Z9889 Other specified postprocedural states: Secondary | ICD-10-CM | POA: Diagnosis not present

## 2022-03-10 DIAGNOSIS — R1319 Other dysphagia: Secondary | ICD-10-CM | POA: Diagnosis not present

## 2022-03-10 DIAGNOSIS — K219 Gastro-esophageal reflux disease without esophagitis: Secondary | ICD-10-CM | POA: Diagnosis not present

## 2022-03-10 DIAGNOSIS — K5904 Chronic idiopathic constipation: Secondary | ICD-10-CM | POA: Diagnosis not present

## 2022-03-12 ENCOUNTER — Other Ambulatory Visit: Payer: Self-pay | Admitting: Family Medicine

## 2022-03-12 DIAGNOSIS — I10 Essential (primary) hypertension: Secondary | ICD-10-CM

## 2022-03-16 DIAGNOSIS — K449 Diaphragmatic hernia without obstruction or gangrene: Secondary | ICD-10-CM | POA: Diagnosis not present

## 2022-03-16 DIAGNOSIS — R1319 Other dysphagia: Secondary | ICD-10-CM | POA: Diagnosis not present

## 2022-03-16 DIAGNOSIS — Z8719 Personal history of other diseases of the digestive system: Secondary | ICD-10-CM | POA: Diagnosis not present

## 2022-03-16 DIAGNOSIS — K219 Gastro-esophageal reflux disease without esophagitis: Secondary | ICD-10-CM | POA: Diagnosis not present

## 2022-03-16 DIAGNOSIS — K222 Esophageal obstruction: Secondary | ICD-10-CM | POA: Diagnosis not present

## 2022-03-16 DIAGNOSIS — Z9889 Other specified postprocedural states: Secondary | ICD-10-CM | POA: Diagnosis not present

## 2022-03-29 ENCOUNTER — Telehealth: Payer: Self-pay | Admitting: Family Medicine

## 2022-03-29 NOTE — Telephone Encounter (Signed)
Contacted Maria Moss to schedule their annual wellness visit. Appointment made for 01/28/2023.  Thank you,  Colletta Maryland,  [facility] Direct Dial ??[phone]

## 2022-03-31 ENCOUNTER — Other Ambulatory Visit: Payer: Medicare HMO

## 2022-03-31 ENCOUNTER — Other Ambulatory Visit: Payer: Self-pay | Admitting: Family Medicine

## 2022-03-31 DIAGNOSIS — Z1231 Encounter for screening mammogram for malignant neoplasm of breast: Secondary | ICD-10-CM

## 2022-04-03 ENCOUNTER — Other Ambulatory Visit: Payer: Self-pay | Admitting: Family Medicine

## 2022-04-12 ENCOUNTER — Ambulatory Visit
Admission: RE | Admit: 2022-04-12 | Discharge: 2022-04-12 | Disposition: A | Discharge status: Home / Self Care | Payer: Medicare HMO | Source: Ambulatory Visit | Attending: Family Medicine | Admitting: Family Medicine

## 2022-04-12 DIAGNOSIS — Z1231 Encounter for screening mammogram for malignant neoplasm of breast: Secondary | ICD-10-CM | POA: Diagnosis not present

## 2022-04-12 DIAGNOSIS — R1084 Generalized abdominal pain: Secondary | ICD-10-CM | POA: Diagnosis not present

## 2022-04-13 DIAGNOSIS — D649 Anemia, unspecified: Secondary | ICD-10-CM | POA: Diagnosis not present

## 2022-04-13 DIAGNOSIS — A049 Bacterial intestinal infection, unspecified: Secondary | ICD-10-CM | POA: Diagnosis not present

## 2022-04-13 DIAGNOSIS — K529 Noninfective gastroenteritis and colitis, unspecified: Secondary | ICD-10-CM | POA: Diagnosis not present

## 2022-04-13 DIAGNOSIS — I10 Essential (primary) hypertension: Secondary | ICD-10-CM | POA: Diagnosis not present

## 2022-04-13 DIAGNOSIS — D72829 Elevated white blood cell count, unspecified: Secondary | ICD-10-CM | POA: Diagnosis not present

## 2022-04-13 DIAGNOSIS — K219 Gastro-esophageal reflux disease without esophagitis: Secondary | ICD-10-CM | POA: Diagnosis not present

## 2022-04-13 DIAGNOSIS — R6520 Severe sepsis without septic shock: Secondary | ICD-10-CM | POA: Diagnosis not present

## 2022-04-13 DIAGNOSIS — R109 Unspecified abdominal pain: Secondary | ICD-10-CM | POA: Diagnosis not present

## 2022-04-13 DIAGNOSIS — F419 Anxiety disorder, unspecified: Secondary | ICD-10-CM | POA: Diagnosis not present

## 2022-04-13 DIAGNOSIS — C750 Malignant neoplasm of parathyroid gland: Secondary | ICD-10-CM | POA: Diagnosis not present

## 2022-04-13 DIAGNOSIS — K589 Irritable bowel syndrome without diarrhea: Secondary | ICD-10-CM | POA: Diagnosis not present

## 2022-04-13 DIAGNOSIS — R1031 Right lower quadrant pain: Secondary | ICD-10-CM | POA: Diagnosis not present

## 2022-04-13 DIAGNOSIS — A419 Sepsis, unspecified organism: Secondary | ICD-10-CM | POA: Diagnosis not present

## 2022-04-13 DIAGNOSIS — E785 Hyperlipidemia, unspecified: Secondary | ICD-10-CM | POA: Diagnosis not present

## 2022-04-13 DIAGNOSIS — N179 Acute kidney failure, unspecified: Secondary | ICD-10-CM | POA: Diagnosis not present

## 2022-04-13 DIAGNOSIS — Z79899 Other long term (current) drug therapy: Secondary | ICD-10-CM | POA: Diagnosis not present

## 2022-04-14 DIAGNOSIS — Z79899 Other long term (current) drug therapy: Secondary | ICD-10-CM | POA: Diagnosis not present

## 2022-04-14 DIAGNOSIS — K219 Gastro-esophageal reflux disease without esophagitis: Secondary | ICD-10-CM | POA: Diagnosis not present

## 2022-04-14 DIAGNOSIS — K589 Irritable bowel syndrome without diarrhea: Secondary | ICD-10-CM | POA: Diagnosis not present

## 2022-04-14 DIAGNOSIS — N179 Acute kidney failure, unspecified: Secondary | ICD-10-CM | POA: Diagnosis not present

## 2022-04-14 DIAGNOSIS — D72829 Elevated white blood cell count, unspecified: Secondary | ICD-10-CM | POA: Diagnosis not present

## 2022-04-15 DIAGNOSIS — K219 Gastro-esophageal reflux disease without esophagitis: Secondary | ICD-10-CM | POA: Diagnosis not present

## 2022-04-15 DIAGNOSIS — N179 Acute kidney failure, unspecified: Secondary | ICD-10-CM | POA: Diagnosis not present

## 2022-04-15 DIAGNOSIS — D72829 Elevated white blood cell count, unspecified: Secondary | ICD-10-CM | POA: Diagnosis not present

## 2022-04-15 DIAGNOSIS — I10 Essential (primary) hypertension: Secondary | ICD-10-CM | POA: Diagnosis not present

## 2022-04-15 DIAGNOSIS — K589 Irritable bowel syndrome without diarrhea: Secondary | ICD-10-CM | POA: Diagnosis not present

## 2022-04-16 ENCOUNTER — Telehealth: Payer: Self-pay | Admitting: *Deleted

## 2022-04-16 ENCOUNTER — Encounter: Payer: Self-pay | Admitting: *Deleted

## 2022-04-16 NOTE — Patient Outreach (Signed)
  Care Coordination   Post-TOC Care Coordination Telephone  Visit Note   04/16/2022 Name: Maria Moss MRN: [id number] DOB: [birth year]  Maria Moss is a 72 y.o. year old female who sees Dettinger, Elige Radon, MD for primary care. I spoke with  Maria Moss by phone today.  Patient returned my call after TOC call placed earlier today  What matters to the patients health and wellness today?  "Thank you for calling me back; I called the pharmacy, and they told me that Cipro antibiotic was only ordered by the hospital discharge doctor for 6 total doses, they said that was what they got from the hospital; there is nothing on the discharge papers about how many days I am supposed to take this medicine, I wanted you to tell me what the hospital notes said."  -- provided information to patient, reading verbatim from hospital discharge provider notes-- which state that 5 days of Cipro and Flagyl were prescribed; positive reinforcement provided to patient for contacting her local outpatient pharmacy for clarification; discussed that this could be a typo made by discharging physician, or a discrepancy post- discharge note: encouraged patient to follow instructions for antibiotics as per written on her Rx bottle, and to make sure she attends scheduled upcoming hospital follow up visit with PCP-- encouraged her to discuss with PCP at that time if she continues to have concerns- she verbalized understanding/ agreement with this plan  SDOH assessments and interventions completed:  Yes Completed earlier today during [facility] call   Care Coordination Interventions:  Yes, provided   Interventions Today    Flowsheet Row Most Recent Value  Chronic Disease   Chronic disease during today's visit Other  [enterocolitis]  General Interventions   General Interventions Discussed/Reviewed General Interventions Discussed  Pharmacy Interventions   Pharmacy Dicussed/Reviewed Pharmacy Topics Discussed  [patient continues to decline full  medication review,  she is requesting additional information from hospital discharge instructions around antibiotics and this was provided accordingly]      Follow up plan: No further intervention required.   Encounter Outcome:  Pt. Visit Completed   Caryl Pina, RN, BSN, CCRN Alumnus RN CM Care Coordination/ Transition of Care- [facility] Management [phone]: direct office

## 2022-04-16 NOTE — Transitions of Care (Post Inpatient/ED Visit) (Signed)
04/16/2022  Name: Maria Moss MRN: [id number] DOB: Jul 27, 1950  Today's TOC FU Call Status: Today's TOC FU Call Status:: Successful TOC FU Call Competed TOC FU Call Complete Date: 04/16/22  Transition Care Management Follow-up Telephone Call Date of Discharge: 04/15/22 Discharge Facility: Other ([facility]) Name of Other ([facility]) Discharge Facility: [facility] Type of Discharge: Inpatient Admission Primary Inpatient Discharge Diagnosis:: shortness of breath; enterocolitis How have you been since you were released from the hospital?: Better ("I am doing okay; I feel weak after being in the hospital.  I got the antibiotics and am holding the 2 blood pressure medications since my blood pressure was low at the hospital.  I am resting right now, I can't review all of my meds") Any questions or concerns?: No  Items Reviewed: Did you receive and understand the discharge instructions provided?: Yes (thoroughly reviewed with patient who verbalizes good understanding of same) Medications obtained and verified?: Yes (Medications Reviewed) (Partial medication review completed; declines full medication review; confirmed patient obtained/ is taking all newly Rx'd medications as instructed; self-manages medications and denies questions/ concerns around medications today) Any new allergies since your discharge?: No Dietary orders reviewed?: Yes Type of Diet Ordered:: Bland/ conservative Do you have support at home?: Yes People in Home: spouse Name of Support/Comfort Primary Source: Reports independent in self-care activities; supportive spouse assists as/ if needed/ indicated  Home Care and Equipment/Supplies: Were Home Health Services Ordered?: No Any new equipment or medical supplies ordered?: No  Functional Questionnaire: Do you need assistance with bathing/showering or dressing?: No Do you need assistance with meal preparation?: No Do you need assistance with eating?: No Do you have  difficulty maintaining continence: No Do you need assistance with getting out of bed/getting out of a chair/moving?: No Do you have difficulty managing or taking your medications?: No  Follow up appointments reviewed: PCP Follow-up appointment confirmed?: Yes Date of PCP follow-up appointment?: 04/22/22 Follow-up Provider: PCP Specialist Hospital Follow-up appointment confirmed?: NA (verified not indicated per hospital discharging provider discharge notes) Do you need transportation to your follow-up appointment?: No Do you understand care options if your condition(s) worsen?: Yes-patient verbalized understanding  SDOH Interventions Today    Flowsheet Row Most Recent Value  SDOH Interventions   Food Insecurity Interventions Intervention Not Indicated  Transportation Interventions Intervention Not Indicated  [drives self,  husband assists as needed/ indicated]      TOC Interventions Today    Flowsheet Row Most Recent Value  TOC Interventions   TOC Interventions Discussed/Reviewed TOC Interventions Discussed  [Patient declines need for ongoing/ further care coordination outreach,  no care coordination needs identified at time of TOC call today,  provided my direct contact information should questions/ concerns/ needs arise post-TOC call]      Interventions Today    Flowsheet Row Most Recent Value  Chronic Disease   Chronic disease during today's visit Other  [acute enterocolitis/ shortness of breath]  General Interventions   General Interventions Discussed/Reviewed General Interventions Discussed, Doctor Visits  Doctor Visits Discussed/Reviewed PCP, Doctor Visits Discussed  PCP/Specialist Visits Compliance with follow-up visit  Education Interventions   Education Provided Provided Education  Provided Verbal Education On Nutrition, When to see the doctor, Medication  [bland/ conservative diet,  need to update medication list at time of upcoming hospital follow up visit with PCP,   possible need to see established GI provider]  Nutrition Interventions   Nutrition Discussed/Reviewed Nutrition Discussed  Pharmacy Interventions   Pharmacy Dicussed/Reviewed Pharmacy Topics Discussed  Oneta Rack, RN, BSN, CCRN Alumnus RN CM Care Coordination/ Transition of [place] Management [phone]: direct office

## 2022-04-19 ENCOUNTER — Other Ambulatory Visit: Payer: Self-pay | Admitting: Family Medicine

## 2022-04-19 DIAGNOSIS — Z780 Asymptomatic menopausal state: Secondary | ICD-10-CM

## 2022-04-22 ENCOUNTER — Ambulatory Visit (INDEPENDENT_AMBULATORY_CARE_PROVIDER_SITE_OTHER): Payer: Medicare HMO | Admitting: Family Medicine

## 2022-04-22 ENCOUNTER — Encounter: Payer: Self-pay | Admitting: Family Medicine

## 2022-04-22 VITALS — BP 149/80 | HR 100 | Ht 63.0 in | Wt 148.0 lb

## 2022-04-22 DIAGNOSIS — K529 Noninfective gastroenteritis and colitis, unspecified: Secondary | ICD-10-CM | POA: Diagnosis not present

## 2022-04-22 DIAGNOSIS — N179 Acute kidney failure, unspecified: Secondary | ICD-10-CM | POA: Diagnosis not present

## 2022-04-22 DIAGNOSIS — D72829 Elevated white blood cell count, unspecified: Secondary | ICD-10-CM

## 2022-04-22 LAB — CMP14+EGFR

## 2022-04-22 LAB — CBC WITH DIFFERENTIAL/PLATELET
Basophils Absolute: 0.1 10*3/uL (ref 0.0–0.2)
Basos: 0 %
Hematocrit: 34.2 % (ref 34.0–46.6)
Immature Granulocytes: 3 %
MCV: 89 fL (ref 79–97)
Neutrophils Absolute: 9.9 10*3/uL — ABNORMAL HIGH (ref 1.4–7.0)
RDW: 12.3 % (ref 11.7–15.4)
WBC: 14.8 10*3/uL — ABNORMAL HIGH (ref 3.4–10.8)

## 2022-04-22 NOTE — Progress Notes (Signed)
BP (!) 149/80   Pulse 100   Ht  (1.6 m)   Wt 148 lb (67.1 kg)   SpO2 97%   BMI 26.22 kg/m    Subjective:   Patient ID: Maria Moss, female    DOB: 27-May-1950, 72 y.o.   MRN: [id number]  HPI: Maria Moss is a 72 y.o. female presenting on 04/22/2022 for Hospitalization Follow-up and Diarrhea   HPI Patient was contacted with transition of care telephone call on 04/16/2022 by Ricke Hey. She went to the Ed with abdominal pain and initially had constipation and abdominal pain and then developed diarrhea.  She is still having some diarrhea and immodium and then Pepto to help some.  She is finished taking cipro and flagyl. She went in on the 4/9 and d/c on the 11th.  She says she is still having some diarrhea but is feeling better.  She says her energy is still down.  She says her abdominal pain is much better.  She is done with the antibiotics and taking Pepto and Imodium and is improving.  She has not called a gastroenterologist but did recommend that she follow-up with them.  She denies any more blood in her stool but she did say that she had some at 1 point.  She denies any fevers or chills.  She denies any nausea or vomiting.  She feels improved but not better yet.  Relevant past medical, surgical, family and social history reviewed and updated as indicated. Interim medical history since our last visit reviewed. Allergies and medications reviewed and updated.  Review of Systems  Constitutional:  Negative for chills and fever.  HENT:  Negative for congestion, ear discharge and ear pain.   Eyes:  Negative for redness and visual disturbance.  Respiratory:  Negative for chest tightness and shortness of breath.   Cardiovascular:  Negative for chest pain and leg swelling.  Gastrointestinal:  Positive for abdominal pain, constipation and diarrhea. Negative for abdominal distention, anal bleeding, nausea and vomiting.  Genitourinary:  Negative for difficulty urinating and dysuria.   Musculoskeletal:  Negative for back pain and gait problem.  Skin:  Negative for rash.  Neurological:  Negative for light-headedness and headaches.  Psychiatric/Behavioral:  Negative for agitation and behavioral problems.   All other systems reviewed and are negative.   Per HPI unless specifically indicated above   Allergies as of 04/22/2022       Reactions   Codeine Nausea And Vomiting   Sulfa Antibiotics    Hair loss, yellowed skin   Doxycycline Hyclate Itching        Medication List        Accurate as of April 22, 2022 10:32 AM. If you have any questions, ask your nurse or doctor.          acetaminophen 500 MG tablet Commonly known as: TYLENOL Take by mouth.   alum & mag hydroxide-simeth 400-400-40 MG/5ML suspension Commonly known as: MAALOX PLUS Take by mouth.   amLODipine 5 MG tablet Commonly known as: NORVASC Take 5 mg by mouth daily.   aspirin EC 81 MG tablet Take 81 mg by mouth daily. Swallow whole.   cetirizine 10 MG tablet Commonly known as: ZYRTEC Take 10 mg by mouth daily.   Cholecalciferol 25 MCG (1000 UT) tablet Take 3,000 Units by mouth daily.   dicyclomine 20 MG tablet Commonly known as: BENTYL Take 20 mg by mouth every 6 (six) hours.   DULoxetine 60 MG capsule Commonly known  as: CYMBALTA TAKE 1 CAPSULE EVERY DAY   ezetimibe 10 MG tablet Commonly known as: Zetia Take 1 tablet (10 mg total) by mouth at bedtime.   famotidine 40 MG tablet Commonly known as: PEPCID one half tablet (20 mg dose) every morning.   FIBER PO Take by mouth.   Fish Oil 1000 MG Caps Take 1,000 mg by mouth daily.   fluticasone 50 MCG/ACT nasal spray Commonly known as: FLONASE USE 2 SPRAYS IN EACH NOSTRIL EVERY DAY   furosemide 20 MG tablet Commonly known as: LASIX TAKE 1 TABLET EVERY DAY   lisinopril 20 MG tablet Commonly known as: ZESTRIL TAKE 1 TABLET (20 MG TOTAL) BY MOUTH DAILY.   loperamide 2 MG tablet Commonly known as: Imodium A-D Take  1 tablet (2 mg total) by mouth 4 (four) times daily as needed for diarrhea or loose stools.   loratadine 10 MG tablet Commonly known as: CLARITIN Take 10 mg by mouth daily.   meloxicam 15 MG tablet Commonly known as: MOBIC TAKE 1 TABLET EVERY DAY   ondansetron 4 MG tablet Commonly known as: Zofran Take 1 tablet (4 mg total) by mouth every 8 (eight) hours as needed for nausea or vomiting.   pantoprazole 40 MG tablet Commonly known as: PROTONIX Take 1 tablet (40 mg total) by mouth daily.   PROBIOTIC & ACIDOPHILUS EX ST PO Take 1 capsule by mouth at bedtime.   traMADol 50 MG tablet Commonly known as: ULTRAM Take 1 tablet (50 mg total) by mouth every 8 (eight) hours as needed (mild pain).         Objective:   BP (!) 149/80   Pulse 100   Ht  (1.6 m)   Wt 148 lb (67.1 kg)   SpO2 97%   BMI 26.22 kg/m   Wt Readings from Last 3 Encounters:  04/22/22 148 lb (67.1 kg)  03/05/22 148 lb (67.1 kg)  02/26/22 148 lb 3.2 oz (67.2 kg)    Physical Exam Vitals and nursing note reviewed.  Constitutional:      General: She is not in acute distress.    Appearance: She is well-developed. She is not diaphoretic.  Eyes:     Conjunctiva/sclera: Conjunctivae normal.  Cardiovascular:     Rate and Rhythm: Normal rate and regular rhythm.     Heart sounds: Normal heart sounds. No murmur heard. Pulmonary:     Effort: Pulmonary effort is normal. No respiratory distress.     Breath sounds: Normal breath sounds. No wheezing.  Abdominal:     General: Abdomen is flat. Bowel sounds are normal. There is no distension.     Palpations: Abdomen is soft.     Tenderness: There is abdominal tenderness (Slight tenderness) in the right lower quadrant and left lower quadrant. There is no right CVA tenderness, left CVA tenderness, guarding or rebound.  Musculoskeletal:        General: No tenderness. Normal range of motion.  Skin:    General: Skin is warm and dry.     Findings: No rash.   Neurological:     Mental Status: She is alert and oriented to person, place, and time.     Coordination: Coordination normal.  Psychiatric:        Behavior: Behavior normal.       Assessment & Plan:   Problem List Items Addressed This Visit   None Visit Diagnoses     Enterocolitis    -  Primary   Relevant Orders  CBC with Differential/Platelet   CMP14+EGFR   AKI (acute kidney injury)       Relevant Orders   CBC with Differential/Platelet   CMP14+EGFR   Leukocytosis, unspecified type       Relevant Orders   CBC with Differential/Platelet     Seems to be improved, will continue to monitor symptoms, recommended that she call gastroenterologist to get an appointment sooner rather than later.  Call us if anything worsens or does not continue to improve.  Follow up plan: Return if symptoms worsen or fail to improve.  Counseling provided for all of the vaccine components Orders Placed This Encounter  Procedures   CBC with Differential/Platelet   CMP14+EGFR    Arville Care, MD [facility] 04/22/2022, 10:32 AM

## 2022-04-23 LAB — CMP14+EGFR
AST: 40 IU/L (ref 0–40)
Alkaline Phosphatase: 417 IU/L — ABNORMAL HIGH (ref 44–121)
BUN: 14 mg/dL (ref 8–27)
Bilirubin Total: 0.3 mg/dL (ref 0.0–1.2)
CO2: 24 mmol/L (ref 20–29)
Chloride: 98 mmol/L (ref 96–106)
Potassium: 4.9 mmol/L (ref 3.5–5.2)
Sodium: 139 mmol/L (ref 134–144)

## 2022-04-23 LAB — CBC WITH DIFFERENTIAL/PLATELET
EOS (ABSOLUTE): 0.4 10*3/uL (ref 0.0–0.4)
Eos: 3 %
Hemoglobin: 11.4 g/dL (ref 11.1–15.9)
Immature Grans (Abs): 0.4 10*3/uL — ABNORMAL HIGH (ref 0.0–0.1)
Lymphocytes Absolute: 2.5 10*3/uL (ref 0.7–3.1)
Lymphs: 17 %
MCH: 29.8 pg (ref 26.6–33.0)
MCHC: 33.3 g/dL (ref 31.5–35.7)
Monocytes Absolute: 1.6 10*3/uL — ABNORMAL HIGH (ref 0.1–0.9)
Monocytes: 11 %
Neutrophils: 66 %
Platelets: 649 10*3/uL — ABNORMAL HIGH (ref 150–450)
RBC: 3.83 x10E6/uL (ref 3.77–5.28)

## 2022-04-28 ENCOUNTER — Other Ambulatory Visit: Payer: Self-pay

## 2022-04-28 DIAGNOSIS — R7989 Other specified abnormal findings of blood chemistry: Secondary | ICD-10-CM

## 2022-04-29 ENCOUNTER — Telehealth: Payer: Self-pay | Admitting: Family Medicine

## 2022-04-29 NOTE — Telephone Encounter (Signed)
Pt made aware of results and recommendations.

## 2022-05-02 ENCOUNTER — Other Ambulatory Visit: Payer: Self-pay | Admitting: Family Medicine

## 2022-05-02 DIAGNOSIS — K219 Gastro-esophageal reflux disease without esophagitis: Secondary | ICD-10-CM

## 2022-05-03 ENCOUNTER — Ambulatory Visit (INDEPENDENT_AMBULATORY_CARE_PROVIDER_SITE_OTHER): Payer: Medicare HMO

## 2022-05-03 DIAGNOSIS — Z780 Asymptomatic menopausal state: Secondary | ICD-10-CM

## 2022-05-03 DIAGNOSIS — M8589 Other specified disorders of bone density and structure, multiple sites: Secondary | ICD-10-CM | POA: Diagnosis not present

## 2022-05-05 ENCOUNTER — Other Ambulatory Visit: Payer: Medicare HMO

## 2022-05-05 DIAGNOSIS — K219 Gastro-esophageal reflux disease without esophagitis: Secondary | ICD-10-CM | POA: Diagnosis not present

## 2022-05-05 DIAGNOSIS — Z9889 Other specified postprocedural states: Secondary | ICD-10-CM | POA: Diagnosis not present

## 2022-05-05 DIAGNOSIS — K589 Irritable bowel syndrome without diarrhea: Secondary | ICD-10-CM | POA: Diagnosis not present

## 2022-05-05 DIAGNOSIS — R7989 Other specified abnormal findings of blood chemistry: Secondary | ICD-10-CM

## 2022-05-05 DIAGNOSIS — Z8719 Personal history of other diseases of the digestive system: Secondary | ICD-10-CM | POA: Diagnosis not present

## 2022-05-05 DIAGNOSIS — K5904 Chronic idiopathic constipation: Secondary | ICD-10-CM | POA: Diagnosis not present

## 2022-05-05 DIAGNOSIS — K5730 Diverticulosis of large intestine without perforation or abscess without bleeding: Secondary | ICD-10-CM | POA: Diagnosis not present

## 2022-05-05 DIAGNOSIS — Z9049 Acquired absence of other specified parts of digestive tract: Secondary | ICD-10-CM | POA: Diagnosis not present

## 2022-05-05 LAB — HEPATIC FUNCTION PANEL
AST: 23 IU/L (ref 0–40)
Bilirubin Total: 0.5 mg/dL (ref 0.0–1.2)

## 2022-05-06 LAB — HEPATIC FUNCTION PANEL
ALT: 20 IU/L (ref 0–32)
Albumin: 4.4 g/dL (ref 3.9–4.9)
Alkaline Phosphatase: 154 IU/L — ABNORMAL HIGH (ref 44–121)
Bilirubin, Direct: 0.12 mg/dL (ref 0.00–0.40)
Total Protein: 7.1 g/dL (ref 6.0–8.5)

## 2022-05-24 ENCOUNTER — Other Ambulatory Visit: Payer: Self-pay | Admitting: Family Medicine

## 2022-05-24 DIAGNOSIS — I10 Essential (primary) hypertension: Secondary | ICD-10-CM

## 2022-06-01 ENCOUNTER — Other Ambulatory Visit: Payer: Self-pay | Admitting: Family Medicine

## 2022-06-05 ENCOUNTER — Other Ambulatory Visit: Payer: Self-pay | Admitting: Family Medicine

## 2022-06-07 ENCOUNTER — Other Ambulatory Visit: Payer: Self-pay | Admitting: Family Medicine

## 2022-06-07 DIAGNOSIS — M5136 Other intervertebral disc degeneration, lumbar region: Secondary | ICD-10-CM

## 2022-06-08 ENCOUNTER — Other Ambulatory Visit: Payer: Self-pay | Admitting: *Deleted

## 2022-06-08 DIAGNOSIS — D75839 Thrombocytosis, unspecified: Secondary | ICD-10-CM

## 2022-06-08 DIAGNOSIS — D509 Iron deficiency anemia, unspecified: Secondary | ICD-10-CM

## 2022-06-11 ENCOUNTER — Ambulatory Visit (INDEPENDENT_AMBULATORY_CARE_PROVIDER_SITE_OTHER): Payer: Medicare HMO | Admitting: Family Medicine

## 2022-06-11 ENCOUNTER — Telehealth: Payer: Self-pay | Admitting: Family Medicine

## 2022-06-11 ENCOUNTER — Encounter: Payer: Self-pay | Admitting: Family Medicine

## 2022-06-11 VITALS — BP 123/79 | HR 94 | Ht 63.0 in | Wt 142.0 lb

## 2022-06-11 DIAGNOSIS — K219 Gastro-esophageal reflux disease without esophagitis: Secondary | ICD-10-CM | POA: Diagnosis not present

## 2022-06-11 DIAGNOSIS — Z1211 Encounter for screening for malignant neoplasm of colon: Secondary | ICD-10-CM | POA: Diagnosis not present

## 2022-06-11 DIAGNOSIS — E782 Mixed hyperlipidemia: Secondary | ICD-10-CM | POA: Diagnosis not present

## 2022-06-11 DIAGNOSIS — M5136 Other intervertebral disc degeneration, lumbar region: Secondary | ICD-10-CM

## 2022-06-11 DIAGNOSIS — I10 Essential (primary) hypertension: Secondary | ICD-10-CM

## 2022-06-11 LAB — LIPID PANEL

## 2022-06-11 LAB — CMP14+EGFR

## 2022-06-11 LAB — CBC WITH DIFFERENTIAL/PLATELET
Basophils Absolute: 0 10*3/uL (ref 0.0–0.2)
EOS (ABSOLUTE): 0.3 10*3/uL (ref 0.0–0.4)
Eos: 4 %
Immature Granulocytes: 0 %
MCHC: 33 g/dL (ref 31.5–35.7)
MCV: 90 fL (ref 79–97)
Neutrophils: 63 %
RBC: 4.13 x10E6/uL (ref 3.77–5.28)

## 2022-06-11 MED ORDER — PANTOPRAZOLE SODIUM 40 MG PO TBEC: 40.0000 mg | DELAYED_RELEASE_TABLET | Freq: Every day | ORAL | 3 refills | Status: DC

## 2022-06-11 MED ORDER — PANTOPRAZOLE SODIUM 40 MG PO TBEC
40.0000 mg | DELAYED_RELEASE_TABLET | Freq: Every day | ORAL | 3 refills | Status: DC
Start: 2022-06-11 — End: 2023-05-02

## 2022-06-11 MED ORDER — LISINOPRIL 20 MG PO TABS: 20.0000 mg | ORAL_TABLET | Freq: Every day | ORAL | 3 refills | Status: DC

## 2022-06-11 MED ORDER — TRAMADOL HCL 50 MG PO TABS
50.0000 mg | ORAL_TABLET | Freq: Three times a day (TID) | ORAL | 2 refills | Status: DC | PRN
Start: 2022-06-11 — End: 2022-09-09

## 2022-06-11 MED ORDER — LISINOPRIL 20 MG PO TABS
20.0000 mg | ORAL_TABLET | Freq: Every day | ORAL | 3 refills | Status: DC
Start: 2022-06-11 — End: 2023-03-03

## 2022-06-11 NOTE — Telephone Encounter (Signed)
Aware meds sent to [facility] pharmacy

## 2022-06-11 NOTE — Telephone Encounter (Signed)
  Prescription Request  06/11/2022  Is this a "Controlled Substance" medicine?   Have you seen your PCP in the last 2 weeks? 06/11/22  If YES, route message to pool  -  If NO, patient needs to be scheduled for appointment.  What is the name of the medication or equipment?  pantoprazole (PROTONIX) 40 MG tablet  lisinopril (ZESTRIL) 20 MG tablet  Have you contacted your pharmacy to request a refill? Yes, sent to wrong pharmacy per pt    Which pharmacy would you like this sent to?  [facility] - [place], [place] - [address]       Patient notified that their request is being sent to the clinical staff for review and that they should receive a response within 2 business days.

## 2022-06-11 NOTE — Progress Notes (Signed)
BP 123/79   Pulse 94   Ht 5\' 3"  (1.6 m)   Wt 142 lb (64.4 kg)   SpO2 96%   BMI 25.15 kg/m    Subjective:   Patient ID: Maria Moss, female    DOB: 1950-10-06, 72 y.o.   MRN: [id number]  HPI: Maria Moss is a 72 y.o. female presenting on 06/11/2022 for Medical Management of Chronic Issues, Hyperlipidemia, Hypertension, Anxiety, and Nevus (Post neck, painful)   HPI Hypertension Patient is currently on lisinopril, and their blood pressure today is 123/79. Patient denies any lightheadedness or dizziness. Patient denies headaches, blurred vision, chest pains, shortness of breath, or weakness. Denies any side effects from medication and is content with current medication.  Seems to be doing fine without the amlodipine so we will keep it off for now.  Hyperlipidemia Patient is coming in for recheck of his hyperlipidemia. The patient is currently taking Zetia. They deny any issues with myalgias or history of liver damage from it. They deny any focal numbness or weakness or chest pain.   Pain assessment: Cause of pain-degenerative disc disease Pain location-lumbar spine lower back Pain on scale of 1-10- 5 Frequency-daily What increases pain-most things including standing and walking What makes pain Better-tramadol Effects on ADL -limited some with walking Any change in general medical condition-none  Current opioids rx-tramadol 50 mg 3 times daily. # meds rx-90 Effectiveness of current meds-works well Adverse reactions from pain meds-none Morphine equivalent-15  Pill count performed-No Last drug screen -12/17/2021 ( high risk q7m, moderate risk q13m, low risk yearly ) Urine drug screen today- No Was the [facility] reviewed-yes  If yes were their any concerning findings? -None Pain contract signed on: 12/17/2021  Relevant past medical, surgical, family and social history reviewed and updated as indicated. Interim medical history since our last visit reviewed. Allergies and medications  reviewed and updated.  Review of Systems  Constitutional:  Negative for chills and fever.  HENT:  Negative for congestion, ear discharge and ear pain.   Eyes:  Negative for redness and visual disturbance.  Respiratory:  Negative for chest tightness and shortness of breath.   Cardiovascular:  Negative for chest pain and leg swelling.  Genitourinary:  Negative for difficulty urinating and dysuria.  Musculoskeletal:  Positive for arthralgias, back pain, gait problem and myalgias.  Skin:  Negative for rash.  Neurological:  Negative for dizziness, light-headedness and headaches.  Psychiatric/Behavioral:  Negative for agitation and behavioral problems.   All other systems reviewed and are negative.   Per HPI unless specifically indicated above   Allergies as of 06/11/2022       Reactions   Codeine Nausea And Vomiting   Glycopyrrolate    constipation   Sulfa Antibiotics    Hair loss, yellowed skin   Doxycycline Hyclate Itching        Medication List        Accurate as of June 11, 2022 10:46 AM. If you have any questions, ask your nurse or doctor.          STOP taking these medications    amLODipine 5 MG tablet Commonly known as: NORVASC Stopped by: Elige Radon Delyle Weider, MD       TAKE these medications    acetaminophen 500 MG tablet Commonly known as: TYLENOL Take by mouth.   alum & mag hydroxide-simeth 400-400-40 MG/5ML suspension Commonly known as: MAALOX PLUS Take by mouth.   aspirin EC 81 MG tablet Take 81 mg by mouth daily.  Swallow whole.   cetirizine 10 MG tablet Commonly known as: ZYRTEC Take 10 mg by mouth daily.   Cholecalciferol 25 MCG (1000 UT) tablet Take 3,000 Units by mouth daily.   dicyclomine 20 MG tablet Commonly known as: BENTYL Take 20 mg by mouth every 6 (six) hours.   doxepin 10 MG capsule Commonly known as: SINEQUAN Take 10 mg by mouth at bedtime.   DULoxetine 60 MG capsule Commonly known as: CYMBALTA TAKE 1 CAPSULE EVERY DAY    ezetimibe 10 MG tablet Commonly known as: ZETIA Take 1 tablet (10 mg total) by mouth at bedtime.   famotidine 40 MG tablet Commonly known as: PEPCID one half tablet (20 mg dose) every morning.   FIBER PO Take by mouth.   Fish Oil 1000 MG Caps Take 1,000 mg by mouth daily.   fluticasone 50 MCG/ACT nasal spray Commonly known as: FLONASE USE 2 SPRAYS IN EACH NOSTRIL EVERY DAY   furosemide 20 MG tablet Commonly known as: LASIX TAKE 1 TABLET EVERY DAY   lisinopril 20 MG tablet Commonly known as: ZESTRIL Take 1 tablet (20 mg total) by mouth daily.   loperamide 2 MG tablet Commonly known as: Imodium A-D Take 1 tablet (2 mg total) by mouth 4 (four) times daily as needed for diarrhea or loose stools.   loratadine 10 MG tablet Commonly known as: CLARITIN Take 10 mg by mouth daily.   meloxicam 15 MG tablet Commonly known as: MOBIC TAKE 1 TABLET EVERY DAY   ondansetron 4 MG tablet Commonly known as: Zofran Take 1 tablet (4 mg total) by mouth every 8 (eight) hours as needed for nausea or vomiting.   pantoprazole 40 MG tablet Commonly known as: PROTONIX Take 1 tablet (40 mg total) by mouth daily.   PROBIOTIC & ACIDOPHILUS EX ST PO Take 1 capsule by mouth at bedtime.   traMADol 50 MG tablet Commonly known as: ULTRAM Take 1 tablet (50 mg total) by mouth every 8 (eight) hours as needed (mild pain). What changed: additional instructions         Objective:   BP 123/79   Pulse 94   Ht 5\' 3"  (1.6 m)   Wt 142 lb (64.4 kg)   SpO2 96%   BMI 25.15 kg/m   Wt Readings from Last 3 Encounters:  06/11/22 142 lb (64.4 kg)  04/22/22 148 lb (67.1 kg)  03/05/22 148 lb (67.1 kg)    Physical Exam Vitals and nursing note reviewed.  Constitutional:      General: She is not in acute distress.    Appearance: She is well-developed. She is not diaphoretic.  Eyes:     Conjunctiva/sclera: Conjunctivae normal.  Cardiovascular:     Rate and Rhythm: Normal rate and regular rhythm.      Heart sounds: Normal heart sounds. No murmur heard. Pulmonary:     Effort: Pulmonary effort is normal. No respiratory distress.     Breath sounds: Normal breath sounds. No wheezing.  Musculoskeletal:        General: Swelling (Trace bilateral lower extremity) present. Normal range of motion.  Skin:    General: Skin is warm and dry.     Findings: No rash.  Neurological:     Mental Status: She is alert and oriented to person, place, and time.     Coordination: Coordination normal.  Psychiatric:        Behavior: Behavior normal.       Assessment & Plan:   Problem List Items Addressed This  Visit       Cardiovascular and Mediastinum   Hypertension - Primary (Chronic)   Relevant Medications   lisinopril (ZESTRIL) 20 MG tablet   Other Relevant Orders   CBC with Differential/Platelet   CMP14+EGFR   Lipid panel     Musculoskeletal and Integument   Degenerative disc disease, lumbar (Chronic)   Relevant Medications   traMADol (ULTRAM) 50 MG tablet     Other   Hyperlipemia (Chronic)   Relevant Medications   lisinopril (ZESTRIL) 20 MG tablet   Other Relevant Orders   CBC with Differential/Platelet   CMP14+EGFR   Lipid panel   Other Visit Diagnoses     Essential hypertension  (Chronic)      Relevant Medications   lisinopril (ZESTRIL) 20 MG tablet   Gastroesophageal reflux disease without esophagitis       Relevant Medications   pantoprazole (PROTONIX) 40 MG tablet   Other Relevant Orders   CBC with Differential/Platelet   CMP14+EGFR   Lipid panel   Colon cancer screening       Relevant Orders   Fecal occult blood, imunochemical([facility])       Continue current medicine, no changes Follow up plan: Return in about 3 months (around 09/11/2022), or if symptoms worsen or fail to improve, for Hypertension and hyperlipidemia.  Counseling provided for all of the vaccine components Orders Placed This Encounter  Procedures   Fecal occult blood,  imunochemical([facility])   CBC with Differential/Platelet   CMP14+EGFR   Lipid panel    Arville Care, MD [facility] 06/11/2022, 10:46 AM

## 2022-06-12 LAB — CBC WITH DIFFERENTIAL/PLATELET
Basos: 0 %
Hematocrit: 37.3 % (ref 34.0–46.6)
Hemoglobin: 12.3 g/dL (ref 11.1–15.9)
Immature Grans (Abs): 0 10*3/uL (ref 0.0–0.1)
Lymphocytes Absolute: 1.8 10*3/uL (ref 0.7–3.1)
Lymphs: 22 %
MCH: 29.8 pg (ref 26.6–33.0)
Monocytes Absolute: 0.9 10*3/uL (ref 0.1–0.9)
Monocytes: 11 %
Neutrophils Absolute: 5.1 10*3/uL (ref 1.4–7.0)
Platelets: 525 10*3/uL — ABNORMAL HIGH (ref 150–450)
RDW: 12.4 % (ref 11.7–15.4)
WBC: 8.2 10*3/uL (ref 3.4–10.8)

## 2022-06-12 LAB — CMP14+EGFR
Albumin/Globulin Ratio: 2 (ref 1.2–2.2)
Albumin: 4.5 g/dL (ref 3.9–4.9)
BUN/Creatinine Ratio: 24 (ref 12–28)
BUN: 34 mg/dL — ABNORMAL HIGH (ref 8–27)
Bilirubin Total: 0.5 mg/dL (ref 0.0–1.2)
Calcium: 9.6 mg/dL (ref 8.7–10.3)
Creatinine, Ser: 1.43 mg/dL — ABNORMAL HIGH (ref 0.57–1.00)
Globulin, Total: 2.3 g/dL (ref 1.5–4.5)
Potassium: 5.5 mmol/L — ABNORMAL HIGH (ref 3.5–5.2)
Total Protein: 6.8 g/dL (ref 6.0–8.5)
eGFR: 39 mL/min/{1.73_m2} — ABNORMAL LOW (ref >59)

## 2022-06-12 LAB — LIPID PANEL: Cholesterol, Total: 212 mg/dL — ABNORMAL HIGH (ref 100–199)

## 2022-06-14 ENCOUNTER — Encounter: Payer: Self-pay | Admitting: Physician Assistant

## 2022-06-14 ENCOUNTER — Telehealth: Payer: Self-pay | Admitting: *Deleted

## 2022-06-14 NOTE — Telephone Encounter (Signed)
Patient called and notified of results and plan.  Verbalized understanding.

## 2022-06-14 NOTE — Telephone Encounter (Signed)
-----   Message from Maria Moss, [place] sent at 06/14/2022  8:42 AM EDT ----- Maria Moss, her WBC and platelets are back to her "normal" level - still mildly elevated, but not as high as they were in April.  No changes to her plan for now - she can f/u w/ me as scheduled in a few months, if you can just let her know.  Thanks! - Maria Moss   ----- Message ----- From: Darolyn Rua Sent: 06/07/2022   2:17 PM EDT To: Maria Amor, RN; Maria Guadalajara, PA-C  Maria Moss, You had messaged about this patient at the end of April, since labs from her PCP at the time showed increasing WBC and platelets above her usual baseline.  I had requested that PCP check repeat CBC/D a month later, but it does not look like that was done.  Can you schedule her for simple CBC/D recheck within the next 1 to 2 weeks, either here or at her PCP office? She does not need an appointment to see me after those labs, but please let me know when the labs are scheduled so that I can keep an eye out for the results. Thanks! - Maria Moss  ----- Message ----- From: Darolyn Rua Sent: 06/01/2022  12:00 AM EDT To: Maria Guadalajara, PA-C  Recheck for CBC (see below) - if nothing yet, ask RN to call patient  ----- Message ----- From: Darolyn Rua Sent: 05/18/2022  12:00 AM EDT To: Maria Guadalajara, PA-C  Did she get CBC and CMP rechecked by her PCP?  Maria Moss messaged on 05/04/22 to see if patient needed to come back sooner based on labs from 04/22/22, and I had requested repeat CBC in 1 month before deciding. (Also had elevations in LFTs)

## 2022-06-14 NOTE — Progress Notes (Signed)
Labs obtained by PCP on 04/22/2022 showed increased platelets 649 and increased WBC 14.8/ANC 9.9/monocytes 1.6.  These were elevated above her usual baseline.  Repeat CBC/date was obtained on 06/11/2022, and WBC/differential has normalized, with platelets return to her usual baseline at 525.  No changes to plan at this time, we will plan on seeing her for follow-up visit in August 2024 as scheduled.  Carnella Guadalajara, PA-C 06/14/22 9:42 AM

## 2022-06-29 ENCOUNTER — Encounter: Payer: Self-pay | Admitting: Physician Assistant

## 2022-07-02 ENCOUNTER — Other Ambulatory Visit: Payer: Medicare HMO

## 2022-07-02 DIAGNOSIS — Z1211 Encounter for screening for malignant neoplasm of colon: Secondary | ICD-10-CM | POA: Diagnosis not present

## 2022-07-03 LAB — FECAL OCCULT BLOOD, IMMUNOCHEMICAL: Fecal Occult Bld: NEGATIVE

## 2022-08-19 ENCOUNTER — Other Ambulatory Visit: Payer: Medicare HMO

## 2022-08-19 DIAGNOSIS — D509 Iron deficiency anemia, unspecified: Secondary | ICD-10-CM | POA: Diagnosis not present

## 2022-08-19 DIAGNOSIS — E538 Deficiency of other specified B group vitamins: Secondary | ICD-10-CM | POA: Diagnosis not present

## 2022-08-19 DIAGNOSIS — D75839 Thrombocytosis, unspecified: Secondary | ICD-10-CM

## 2022-08-19 DIAGNOSIS — D638 Anemia in other chronic diseases classified elsewhere: Secondary | ICD-10-CM | POA: Diagnosis not present

## 2022-08-20 ENCOUNTER — Inpatient Hospital Stay: Payer: Medicare HMO

## 2022-08-20 LAB — COMPREHENSIVE METABOLIC PANEL
ALT: 26 IU/L (ref 0–32)
AST: 23 IU/L (ref 0–40)
Albumin: 4.2 g/dL (ref 3.9–4.9)
Alkaline Phosphatase: 125 IU/L — ABNORMAL HIGH (ref 44–121)
BUN/Creatinine Ratio: 19 (ref 12–28)
BUN: 43 mg/dL — ABNORMAL HIGH (ref 8–27)
Bilirubin Total: 0.4 mg/dL (ref 0.0–1.2)
CO2: 23 mmol/L (ref 20–29)
Calcium: 9.1 mg/dL (ref 8.7–10.3)
Chloride: 98 mmol/L (ref 96–106)
Creatinine, Ser: 2.23 mg/dL — ABNORMAL HIGH (ref 0.57–1.00)
Globulin, Total: 2.4 g/dL (ref 1.5–4.5)
Glucose: 95 mg/dL (ref 70–99)
Potassium: 5.9 mmol/L (ref 3.5–5.2)
Sodium: 136 mmol/L (ref 134–144)
Total Protein: 6.6 g/dL (ref 6.0–8.5)
eGFR: 23 mL/min/{1.73_m2} — ABNORMAL LOW (ref >59)

## 2022-08-20 LAB — CBC WITH DIFFERENTIAL/PLATELET
Basophils Absolute: 0 10*3/uL (ref 0.0–0.2)
Basos: 1 %
EOS (ABSOLUTE): 0.4 10*3/uL (ref 0.0–0.4)
Eos: 5 %
Hematocrit: 34.1 % (ref 34.0–46.6)
Hemoglobin: 11.4 g/dL (ref 11.1–15.9)
Immature Grans (Abs): 0 10*3/uL (ref 0.0–0.1)
Immature Granulocytes: 1 %
Lymphocytes Absolute: 2.5 10*3/uL (ref 0.7–3.1)
Lymphs: 32 %
MCH: 29.7 pg (ref 26.6–33.0)
MCHC: 33.4 g/dL (ref 31.5–35.7)
MCV: 89 fL (ref 79–97)
Monocytes Absolute: 1 10*3/uL — ABNORMAL HIGH (ref 0.1–0.9)
Monocytes: 13 %
Neutrophils Absolute: 3.8 10*3/uL (ref 1.4–7.0)
Neutrophils: 48 %
Platelets: 484 10*3/uL — ABNORMAL HIGH (ref 150–450)
RBC: 3.84 x10E6/uL (ref 3.77–5.28)
RDW: 13.1 % (ref 11.7–15.4)
WBC: 7.8 10*3/uL (ref 3.4–10.8)

## 2022-08-20 LAB — FERRITIN: Ferritin: 305 ng/mL — ABNORMAL HIGH (ref 15–150)

## 2022-08-20 LAB — IRON AND TIBC
Iron Saturation: 34 % (ref 15–55)
Iron: 95 ug/dL (ref 27–139)
Total Iron Binding Capacity: 281 ug/dL (ref 250–450)
UIBC: 186 ug/dL (ref 118–369)

## 2022-08-23 LAB — LACTATE DEHYDROGENASE: LDH: 156 IU/L (ref 119–226)

## 2022-08-23 LAB — METHYLMALONIC ACID, SERUM: Methylmalonic Acid: 472 nmol/L — ABNORMAL HIGH (ref 0–378)

## 2022-08-23 LAB — VITAMIN B12: Vitamin B-12: 634 pg/mL (ref 232–1245)

## 2022-08-25 ENCOUNTER — Telehealth: Payer: Self-pay | Admitting: *Deleted

## 2022-08-25 ENCOUNTER — Telehealth: Payer: Self-pay | Admitting: Family Medicine

## 2022-08-25 ENCOUNTER — Other Ambulatory Visit: Payer: Self-pay | Admitting: Family Medicine

## 2022-08-25 NOTE — Telephone Encounter (Signed)
Patient notified of K+ of 5.9 and was instructed to follow up with her PCP for management, however she states that she has been drinking liquid IV every day.  Recommended that she not drink those on a regular basis, as that is likely the cause of her elevated levels.  States she has not had since Monday and will discontinue use.  Rojelio Brenner - PAC aware.

## 2022-08-26 NOTE — Telephone Encounter (Signed)
Appt made for Wednesday Aug 28th at 3:25. Pt made aware. [place] approved waiting until then.

## 2022-08-26 NOTE — Telephone Encounter (Signed)
Yes I do see that it was high and if they do want [name] to repeat it then she will need an appointment.  Please make an appointment for her ASAP so that we can recheck this

## 2022-08-27 ENCOUNTER — Inpatient Hospital Stay: Payer: Medicare HMO | Attending: Physician Assistant | Admitting: Oncology

## 2022-08-27 DIAGNOSIS — D509 Iron deficiency anemia, unspecified: Secondary | ICD-10-CM | POA: Diagnosis not present

## 2022-08-27 DIAGNOSIS — E538 Deficiency of other specified B group vitamins: Secondary | ICD-10-CM

## 2022-08-27 DIAGNOSIS — D75839 Thrombocytosis, unspecified: Secondary | ICD-10-CM

## 2022-08-27 NOTE — Progress Notes (Addendum)
[facility] [address] [place], [place] [address]   CLINIC:  Medical Oncology/Hematology  PCP:  Dettinger, Elige Radon, MD [address] [place] [address] [phone]   REASON FOR VISIT:  Follow-up for normocytic anemia and thrombocytosis   CURRENT THERAPY: Intermittent iron infusions (last Feraheme on 10/26/2019 and 11/02/2019)  I connected with Maria Moss on 08/27/22 at  1:00 PM EDT by telephone visit and verified that I am speaking with the correct person using two identifiers.   I discussed the limitations, risks, security and privacy concerns of performing an evaluation and management service by telemedicine and the availability of in-person appointments. I also discussed with the patient that there may be a patient responsible charge related to this service. The patient expressed understanding and agreed to proceed.   Other persons participating in the visit and their role in the encounter: NP, Patient    Patient's location: Home  Provider's location: Clinic     INTERVAL HISTORY:   Maria Moss 72 y.o. female returns for routine follow-up of her normocytic anemia and thrombocytosis.  She was last seen in office by Rojelio Brenner PA-C on 06/14/2022.  Today, she reports she is doing fair. Reports no energy.   Reports recent overnight stay at [facility] ED for abdominal pain where she was diagnosed with colitis.  She was given IV fluids and antibiotics with resolution of her symptoms.  Did notice some blood in her stools.  No additional episodes since she was treated.  Reports up until this past week she has been drinking liquid IV 1 packet each day.  Instructed to stop drinking liquid IVs and have her labs checked next week at her PCPs office.  Appetite is 75% but she has little to no energy.  She denies any recent bleeding from her rectum or dark tarry stools.  Has chronic fatigue which is unchanged and mild dyspnea with exertion.  No chest pain, lightheadedness or  syncopal episodes. She denies any B symptoms such as fever, chills, night sweats, or unintentional weight loss.  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       ASSESSMENT & PLAN:  1.  Thrombocytosis: - She had thrombocytosis for the last several years. - JAK2 V617F and reflex mutation testing was negative.  BCR/ABL FISH was negative. - No history of DVT or PE. - Platelet count has been elevated independent of iron levels - Lifelong non-smoker.  No definitive history of autoimmune or inflammatory disease, but was previously told that she "might have lupus or fibromyalgia." - ANA and rheumatoid factor are negative (12/04/2020). - CRP normal.  ESR ranges from normal to mildly elevated at 46. - Bone marrow biopsy (09/11/2021): Hypercellular bone marrow with mild megakaryocytic hyperplasia with mild atypia with otherwise orderly erythroid and myeloid maturation.  Per pathologist, this still could represent a reactive process, the persistence of her thrombocytosis is concerning and repeat BCR/ABL and NexGen myeloid panel were recommended.  Marrow showed increased histiocytic iron stores  Cytogenetics with normal female karyotype 46, XX[20] BCR/ABL negative.  JAK2 V617F negative.  JAK2 exon 12-13 negative.  CALR negative. 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  In some studies of patients with confirmed JAK2 MPN's, additional mutation of NF1 cause increased severity of MPN.  However, little data concerning isolated mutations of NF1 alone. - Most recent CBC from 08/19/2022 shows a platelet count of 484,000.   Differential is normal. - PLAN:   -Will continue active surveillance. - No indication for cytoreductive therapy at this time.  Can continue active surveillance, but would consider Hydrea if patient had platelet count over 1 million - Continue aspirin 81 mg daily to decrease risk of VTE in setting of thrombocytosis. - Repeat labs (PCP) RTC in 6 months.   2.  Anemia of chronic disease +/- iron deficiency -This is a combination of CKD and iron deficiency state. - EGD (09/20/2019): Gastritis, gastric polyp, hiatal hernia - She had hiatal hernia repair November 2021 - Last Feraheme infusion was on 10/26/2019 and 11/02/2019 -  She reports occasional "dark and sticky" stools, but denies any black bowel movements.  No bright red blood per rectum. - She had a positive Cologuard test on 03/11/2021. - Colonoscopy at [facility] on 06/10/2021 - review of results via Care Everywhere which showed internal hemorrhoids, few diverticula and nonspecific bulbous ileocecal valve which was biopsied.  I am not able to see pathology results. - She complains of significant fatigue, which is chronic - Most recent labs from 08/19/2022 show hemoglobin of 11.4, ferritin 305 and iron saturation is 34%.  B12 634 and MMA elevated at 472.  Creatinine 2.23 with a GFR of 23. - PLAN:  -No anemia or iron deficiency at this time.   -No intervention needed.   -Repeat labs and RTC in 6 months   3.  Vitamin B12 deficiency: - She was previously taking B12 cyanocobalamin 1 mg daily, stopped several years ago  - Most recent B12 is 634 and MMA is elevated at 472. - PLAN:  -Given elevation and MMA recommend restarting B12 1 mg tablets daily -Recheck labs in 6 months (B12 and MMA).    4.  Papillary thyroid cancer, follicular variant - Resection of cancerous thyroid nodule in May 2021 - Followed by Dr. Lafe Garin, who discussed with patient that her thyroid cancer is low risk since it was smaller than 2 cm and without any signs of  extension or invasion in the lymphovascular system. - Per Dr. Lafe Garin, no additional treatment was necessary, and she has been following the patient with annual ultrasounds. - New left thyroid nodule found to be benign per FNA biopsy in March 2023. - PLAN:  -Continue follow-up with Dr. Lafe Garin   5.  Primary hyperparathyroidism -Patient has history of hypercalcemia, which was found to be primary hypercalcemia - Calcium decreased to normal after surgery (May 2021) - PLAN:  -Continue follow-up with Dr. Lafe Garin  6.  Hyperkalemia -Labs from 08/19/2022 showed potassium level of 5.9. -PCP is already reached out and is asked her to stop drinking liquid IVs and repeat lab work next Wednesday.  7. CKD -Persistent elevation in creatinine.  Will refer to Dr. Wolfgang Phoenix nephrology for consultation. -Patient is agreeable.  PLAN SUMMARY: >> Return to clinic in 6 months with labs (CBC/D, CMP, LDH, ferritin, iron/TIBC, B12, MMA) - to be drawn at [facility] ([facility]) and virtual visit a few days later. >> Referral to Dr. Wolfgang Phoenix.     REVIEW OF SYSTEMS:   Review of Systems  Constitutional:  Positive for fatigue.  Gastrointestinal:  Positive for abdominal pain, blood in stool, constipation and diarrhea.     PHYSICAL EXAM:  ECOG PERFORMANCE STATUS: 1 - Symptomatic but completely ambulatory  There were no vitals filed for this visit. There were no vitals filed for this visit. Physical Exam Neurological:     Mental Status: She is alert and oriented to person, place, and time.     PAST MEDICAL/SURGICAL HISTORY:  Past Medical History:  Diagnosis Date   Anemia    iron deficiency   Ankle fracture, left 2006   Diverticula, intestine    Mild case.   Fibromyalgia    Dr. Phylliss Bob Dx in [place] years ago  no meds    GERD (gastroesophageal reflux disease)    History of hiatal hernia    Hyperlipidemia    Hyperparathyroidism (HCC)    Hypertension    Dx age 85.    Stroke [facility])    Questionable stoke when  born or polio not sure pt. has right side weakness toes right foot turns outward and toes curled up some    Past Surgical History:  Procedure Laterality Date   ANKLE SURGERY Left    Broken.  Has screws and metal plate.   CHOLECYSTECTOMY     PARATHYROIDECTOMY Right 05/29/2019   Procedure: RIGHT INFERIOR PARATHYROIDECTOMY;  Surgeon: Darnell Level, MD;  Location: [facility];  Service: General;  Laterality: Right;   ROBOTIC ADRENALECTOMY Right 05/31/2018   Procedure: XI ROBOTIC RIGHT ADRENALECTOMY;  Surgeon: Axel Filler, MD;  Location: [facility];  Service: General;  Laterality: Right;   THYROID LOBECTOMY Right 05/29/2019   Procedure: RIGHT THYROID LOBECTOMY;  Surgeon: Darnell Level, MD;  Location: [facility];  Service: General;  Laterality: Right;    SOCIAL HISTORY:  Social History   Socioeconomic History   Marital status: Married    Spouse name: Not on file   Number of children: Not on file   Years of education: Not on file   Highest education level: Not on file  Occupational History   Not on file  Tobacco Use   Smoking status: Never   Smokeless tobacco: Never  Vaping Use   Vaping status: Never Used  Substance and Sexual Activity   Alcohol use: No   Drug use: No   Sexual activity: Not Currently  Other Topics Concern   Not on file  Social History Narrative   Not on file   Social Determinants of Health   Financial Resource Strain: Low Risk  (01/26/2022)   Overall Financial Resource Strain (CARDIA)    Difficulty of Paying Living Expenses: Not hard at all  Food Insecurity: No Food Insecurity (04/16/2022)   Hunger Vital Sign    Worried About Running Out of Food in the Last Year: Never true    Ran Out of Food in the Last Year: Never true  Transportation Needs: No Transportation Needs (04/16/2022)   PRAPARE - Administrator, Civil Service (Medical): No    Lack of Transportation (Non-Medical): No  Physical Activity: Insufficiently Active (01/26/2022)   Exercise Vital Sign     Days of Exercise per Week: 5 days    Minutes of Exercise per Session: 20 min  Stress: No Stress Concern Present (01/26/2022)   [employer] of Occupational Health - Occupational Stress Questionnaire    Feeling of Stress : Not at all  Social Connections: Socially Integrated (01/26/2022)   Social Connection and Isolation Panel [NHANES]    Frequency of Communication with Friends and Family: More than three times a week    Frequency of Social Gatherings with Friends and Family: More than three times a  week    Attends Religious Services: More than 4 times per year    Active Member of Clubs or Organizations: Yes    Attends Banker Meetings: More than 4 times per year    Marital Status: Married  Catering manager Violence: Not At Risk (01/26/2022)   Humiliation, Afraid, Rape, and Kick questionnaire    Fear of Current or Ex-Partner: No    Emotionally Abused: No    Physically Abused: No    Sexually Abused: No    FAMILY HISTORY:  Family History  Problem Relation Age of Onset   Heart disease Mother        CHF   Cancer Mother        Uterine   Osteoporosis Mother    COPD Sister    Asthma Sister    Migraines Sister    Breast cancer Neg Hx     CURRENT MEDICATIONS:  Outpatient Encounter Medications as of 08/27/2022  Medication Sig Note   acetaminophen (TYLENOL) 500 MG tablet Take by mouth.    alum & mag hydroxide-simeth (MAALOX PLUS) 400-400-40 MG/5ML suspension Take by mouth.    aspirin EC 81 MG tablet Take 81 mg by mouth daily. Swallow whole.    cetirizine (ZYRTEC) 10 MG tablet Take 10 mg by mouth daily.    Cholecalciferol 25 MCG (1000 UT) tablet Take 3,000 Units by mouth daily.     dicyclomine (BENTYL) 20 MG tablet Take 20 mg by mouth every 6 (six) hours.    doxepin (SINEQUAN) 10 MG capsule Take 10 mg by mouth at bedtime.    DULoxetine (CYMBALTA) 60 MG capsule TAKE 1 CAPSULE EVERY DAY    ezetimibe (ZETIA) 10 MG tablet Take 1 tablet (10 mg total) by mouth at bedtime.     famotidine (PEPCID) 40 MG tablet one half tablet (20 mg dose) every morning.    FIBER PO Take by mouth.    fluticasone (FLONASE) 50 MCG/ACT nasal spray USE 2 SPRAYS IN EACH NOSTRIL EVERY DAY    furosemide (LASIX) 20 MG tablet TAKE 1 TABLET EVERY DAY    lisinopril (ZESTRIL) 20 MG tablet Take 1 tablet (20 mg total) by mouth daily.    loperamide (IMODIUM A-D) 2 MG tablet Take 1 tablet (2 mg total) by mouth 4 (four) times daily as needed for diarrhea or loose stools.    loratadine (CLARITIN) 10 MG tablet Take 10 mg by mouth daily. 04/16/2019: Alternates between zyrtec and claritin   meloxicam (MOBIC) 15 MG tablet TAKE 1 TABLET EVERY DAY    Omega-3 Fatty Acids (FISH OIL) 1000 MG CAPS Take 1,000 mg by mouth daily.     ondansetron (ZOFRAN) 4 MG tablet Take 1 tablet (4 mg total) by mouth every 8 (eight) hours as needed for nausea or vomiting.    pantoprazole (PROTONIX) 40 MG tablet Take 1 tablet (40 mg total) by mouth daily.    Probiotic Product (PROBIOTIC & ACIDOPHILUS EX ST PO) Take 1 capsule by mouth at bedtime.     traMADol (ULTRAM) 50 MG tablet Take 1 tablet (50 mg total) by mouth every 8 (eight) hours as needed (mild pain).    No facility-administered encounter medications on file as of 08/27/2022.    ALLERGIES:  Allergies  Allergen Reactions   Codeine Nausea And Vomiting   Glycopyrrolate     constipation   Sulfa Antibiotics     Hair loss, yellowed skin   Doxycycline Hyclate Itching    LABORATORY DATA:  I have reviewed the  labs as listed.  CBC    Component Value Date/Time   WBC 7.8 08/19/2022 1120   WBC 7.6 09/11/2021 0757   RBC 3.84 08/19/2022 1120   RBC 4.01 09/11/2021 0757   HGB 11.4 08/19/2022 1120   HCT 34.1 08/19/2022 1120   PLT 484 (H) 08/19/2022 1120   MCV 89 08/19/2022 1120   MCH 29.7 08/19/2022 1120   MCH 30.4 09/11/2021 0757   MCHC 33.4 08/19/2022 1120   MCHC 32.8 09/11/2021 0757   RDW 13.1 08/19/2022 1120   LYMPHSABS 2.5 08/19/2022 1120   MONOABS 0.8 09/11/2021  0757   EOSABS 0.4 08/19/2022 1120   BASOSABS 0.0 08/19/2022 1120      Latest Ref Rng & Units 08/19/2022   11:20 AM 06/11/2022   11:15 AM 05/05/2022   10:28 AM  CMP  Glucose 70 - 99 mg/dL 95  91    BUN 8 - 27 mg/dL 43  34    Creatinine 6.06 - 1.00 mg/dL 3.01  6.01    Sodium 093 - 144 mmol/L 136  138    Potassium 3.5 - 5.2 mmol/L 5.9  5.5    Chloride 96 - 106 mmol/L 98  100    CO2 20 - 29 mmol/L 23  24    Calcium 8.7 - 10.3 mg/dL 9.1  9.6    Total Protein 6.0 - 8.5 g/dL 6.6  6.8  7.1   Total Bilirubin 0.0 - 1.2 mg/dL 0.4  0.5  0.5   Alkaline Phos 44 - 121 IU/L 125  115  154   AST 0 - 40 IU/L 23  22  23    ALT 0 - 32 IU/L 26  15  20      DIAGNOSTIC IMAGING:  I have independently reviewed the relevant imaging and discussed with the patient.   WRAP UP:  All questions were answered. The patient knows to call the clinic with any problems, questions or concerns.  Medical decision making: Moderate  Time spent on visit: I provided 15 minutes of non face-to-face telephone visit time during this encounter, and > 50% was spent counseling as documented under my assessment & plan.   Mauro Kaufmann, NP  08/27/22 1:30 PM

## 2022-09-01 ENCOUNTER — Encounter: Payer: Self-pay | Admitting: Family Medicine

## 2022-09-01 ENCOUNTER — Ambulatory Visit (INDEPENDENT_AMBULATORY_CARE_PROVIDER_SITE_OTHER): Payer: Medicare HMO | Admitting: Family Medicine

## 2022-09-01 VITALS — BP 127/75 | HR 90 | Ht 63.0 in | Wt 150.0 lb

## 2022-09-01 DIAGNOSIS — N1832 Chronic kidney disease, stage 3b: Secondary | ICD-10-CM | POA: Diagnosis not present

## 2022-09-01 DIAGNOSIS — E875 Hyperkalemia: Secondary | ICD-10-CM | POA: Diagnosis not present

## 2022-09-01 DIAGNOSIS — N179 Acute kidney failure, unspecified: Secondary | ICD-10-CM | POA: Diagnosis not present

## 2022-09-01 NOTE — Progress Notes (Signed)
BP 127/75   Pulse 90   Ht 5\' 3"  (1.6 m)   Wt 150 lb (68 kg)   SpO2 95%   BMI 26.57 kg/m    Subjective:   Patient ID: Maria Moss, female    DOB: 12-25-1950, 72 y.o.   MRN: [id number]  HPI: Maria Moss is a 72 y.o. female presenting on 09/01/2022 for Elevated Potassium   HPI High potassium Patient is coming in today for follow-up for high potassium.  She saw her oncologist who did some testing and was found that the kidney numbers were up and the potassium numbers were up.  She says she feels a little off balance at times but denies any lightheadedness or chest pain or palpitations.  She says she was taking a lot of liquid IVs and she stopped that about 4 5 days ago.  Relevant past medical, surgical, family and social history reviewed and updated as indicated. Interim medical history since our last visit reviewed. Allergies and medications reviewed and updated.  Review of Systems  Constitutional:  Negative for chills and fever.  Eyes:  Negative for visual disturbance.  Respiratory:  Negative for chest tightness and shortness of breath.   Cardiovascular:  Negative for chest pain and leg swelling.  Musculoskeletal:  Negative for back pain and gait problem.  Skin:  Negative for rash.  Neurological:  Negative for dizziness, light-headedness and headaches.  Psychiatric/Behavioral:  Negative for agitation and behavioral problems.   All other systems reviewed and are negative.   Per HPI unless specifically indicated above   Allergies as of 09/01/2022       Reactions   Codeine Nausea And Vomiting   Glycopyrrolate    constipation   Sulfa Antibiotics    Hair loss, yellowed skin   Doxycycline Hyclate Itching        Medication List        Accurate as of September 01, 2022  3:50 PM. If you have any questions, ask your nurse or doctor.          acetaminophen 500 MG tablet Commonly known as: TYLENOL Take by mouth.   alum & mag hydroxide-simeth 400-400-40 MG/5ML  suspension Commonly known as: MAALOX PLUS Take by mouth.   aspirin EC 81 MG tablet Take 81 mg by mouth daily. Swallow whole.   cetirizine 10 MG tablet Commonly known as: ZYRTEC Take 10 mg by mouth daily.   Cholecalciferol 25 MCG (1000 UT) tablet Take 3,000 Units by mouth daily.   dicyclomine 20 MG tablet Commonly known as: BENTYL Take 20 mg by mouth every 6 (six) hours.   doxepin 10 MG capsule Commonly known as: SINEQUAN Take 10 mg by mouth at bedtime.   DULoxetine 60 MG capsule Commonly known as: CYMBALTA TAKE 1 CAPSULE EVERY DAY   ezetimibe 10 MG tablet Commonly known as: ZETIA Take 1 tablet (10 mg total) by mouth at bedtime.   famotidine 40 MG tablet Commonly known as: PEPCID one half tablet (20 mg dose) every morning.   FIBER PO Take by mouth.   Fish Oil 1000 MG Caps Take 1,000 mg by mouth daily.   fluticasone 50 MCG/ACT nasal spray Commonly known as: FLONASE USE 2 SPRAYS IN EACH NOSTRIL EVERY DAY   furosemide 20 MG tablet Commonly known as: LASIX TAKE 1 TABLET EVERY DAY   lisinopril 20 MG tablet Commonly known as: ZESTRIL Take 1 tablet (20 mg total) by mouth daily.   loperamide 2 MG tablet Commonly known as: Imodium  A-D Take 1 tablet (2 mg total) by mouth 4 (four) times daily as needed for diarrhea or loose stools.   loratadine 10 MG tablet Commonly known as: CLARITIN Take 10 mg by mouth daily.   meloxicam 15 MG tablet Commonly known as: MOBIC TAKE 1 TABLET EVERY DAY   ondansetron 4 MG tablet Commonly known as: Zofran Take 1 tablet (4 mg total) by mouth every 8 (eight) hours as needed for nausea or vomiting.   pantoprazole 40 MG tablet Commonly known as: PROTONIX Take 1 tablet (40 mg total) by mouth daily.   PROBIOTIC & ACIDOPHILUS EX ST PO Take 1 capsule by mouth at bedtime.   traMADol 50 MG tablet Commonly known as: ULTRAM Take 1 tablet (50 mg total) by mouth every 8 (eight) hours as needed (mild pain).         Objective:    BP 127/75   Pulse 90   Ht 5\' 3"  (1.6 m)   Wt 150 lb (68 kg)   SpO2 95%   BMI 26.57 kg/m   Wt Readings from Last 3 Encounters:  09/01/22 150 lb (68 kg)  06/11/22 142 lb (64.4 kg)  04/22/22 148 lb (67.1 kg)    Physical Exam Vitals and nursing note reviewed.  Constitutional:      General: She is not in acute distress.    Appearance: She is well-developed. She is not diaphoretic.  Eyes:     Conjunctiva/sclera: Conjunctivae normal.  Cardiovascular:     Rate and Rhythm: Normal rate and regular rhythm.     Heart sounds: Normal heart sounds. No murmur heard. Pulmonary:     Effort: Pulmonary effort is normal. No respiratory distress.     Breath sounds: Normal breath sounds. No wheezing.  Skin:    General: Skin is warm and dry.     Findings: No rash.  Neurological:     Mental Status: She is alert and oriented to person, place, and time.     Coordination: Coordination normal.  Psychiatric:        Behavior: Behavior normal.     Results for orders placed or performed in visit on 08/19/22  Methylmalonic acid, serum  Result Value Ref Range   Methylmalonic Acid 472 (H) 0 - 378 nmol/L  Vitamin B12  Result Value Ref Range   Vitamin B-12 634 232 - 1,245 pg/mL  Iron and TIBC  Result Value Ref Range   Total Iron Binding Capacity 281 250 - 450 ug/dL   UIBC 308 657 - 846 ug/dL   Iron 95 27 - 962 ug/dL   Iron Saturation 34 15 - 55 %  Ferritin  Result Value Ref Range   Ferritin 305 (H) 15 - 150 ng/mL  Lactate dehydrogenase  Result Value Ref Range   LDH 156 119 - 226 IU/L  Comprehensive metabolic panel  Result Value Ref Range   Glucose 95 70 - 99 mg/dL   BUN 43 (H) 8 - 27 mg/dL   Creatinine, Ser 9.52 (H) 0.57 - 1.00 mg/dL   eGFR 23 (L) >84 XL/KGM/0.10   BUN/Creatinine Ratio 19 12 - 28   Sodium 136 134 - 144 mmol/L   Potassium 5.9 (HH) 3.5 - 5.2 mmol/L   Chloride 98 96 - 106 mmol/L   CO2 23 20 - 29 mmol/L   Calcium 9.1 8.7 - 10.3 mg/dL   Total Protein 6.6 6.0 - 8.5 g/dL    Albumin 4.2 3.9 - 4.9 g/dL   Globulin, Total 2.4 1.5 - 4.5  g/dL   Bilirubin Total 0.4 0.0 - 1.2 mg/dL   Alkaline Phosphatase 125 (H) 44 - 121 IU/L   AST 23 0 - 40 IU/L   ALT 26 0 - 32 IU/L  CBC with Differential/Platelet  Result Value Ref Range   WBC 7.8 3.4 - 10.8 x10E3/uL   RBC 3.84 3.77 - 5.28 x10E6/uL   Hemoglobin 11.4 11.1 - 15.9 g/dL   Hematocrit 52.8 41.3 - 46.6 %   MCV 89 79 - 97 fL   MCH 29.7 26.6 - 33.0 pg   MCHC 33.4 31.5 - 35.7 g/dL   RDW 24.4 01.0 - 27.2 %   Platelets 484 (H) 150 - 450 x10E3/uL   Neutrophils 48 Not Estab. %   Lymphs 32 Not Estab. %   Monocytes 13 Not Estab. %   Eos 5 Not Estab. %   Basos 1 Not Estab. %   Neutrophils Absolute 3.8 1.4 - 7.0 x10E3/uL   Lymphocytes Absolute 2.5 0.7 - 3.1 x10E3/uL   Monocytes Absolute 1.0 (H) 0.1 - 0.9 x10E3/uL   EOS (ABSOLUTE) 0.4 0.0 - 0.4 x10E3/uL   Basophils Absolute 0.0 0.0 - 0.2 x10E3/uL   Immature Granulocytes 1 Not Estab. %   Immature Grans (Abs) 0.0 0.0 - 0.1 x10E3/uL    Assessment & Plan:   Problem List Items Addressed This Visit   None Visit Diagnoses     Hyperkalemia    -  Primary   Relevant Orders   BMP8+EGFR   Acute renal failure superimposed on stage 3b chronic kidney disease, unspecified acute renal failure type (HCC)       Relevant Orders   BMP8+EGFR      Will recheck levels, already had referral to nephrology  Follow up plan: Return if symptoms worsen or fail to improve.  Counseling provided for all of the vaccine components Orders Placed This Encounter  Procedures   BMP8+EGFR    Arville Care, MD [facility] 09/01/2022, 3:50 PM

## 2022-09-02 ENCOUNTER — Other Ambulatory Visit: Payer: Self-pay

## 2022-09-02 ENCOUNTER — Telehealth: Payer: Self-pay | Admitting: Family Medicine

## 2022-09-02 DIAGNOSIS — E875 Hyperkalemia: Secondary | ICD-10-CM

## 2022-09-02 LAB — BMP8+EGFR
BUN/Creatinine Ratio: 19 (ref 12–28)
BUN: 44 mg/dL — ABNORMAL HIGH (ref 8–27)
CO2: 22 mmol/L (ref 20–29)
Calcium: 8.9 mg/dL (ref 8.7–10.3)
Chloride: 98 mmol/L (ref 96–106)
Creatinine, Ser: 2.27 mg/dL — ABNORMAL HIGH (ref 0.57–1.00)
Glucose: 84 mg/dL (ref 70–99)
Potassium: 5.9 mmol/L (ref 3.5–5.2)
Sodium: 135 mmol/L (ref 134–144)
eGFR: 23 mL/min/{1.73_m2} — ABNORMAL LOW (ref >59)

## 2022-09-02 NOTE — Telephone Encounter (Signed)
[facility] called with critical lab result.  Potassium 5.9

## 2022-09-07 ENCOUNTER — Other Ambulatory Visit: Payer: Medicare HMO

## 2022-09-07 ENCOUNTER — Other Ambulatory Visit: Payer: Self-pay

## 2022-09-07 DIAGNOSIS — E875 Hyperkalemia: Secondary | ICD-10-CM

## 2022-09-08 LAB — BMP8+EGFR
BUN/Creatinine Ratio: 20 (ref 12–28)
BUN: 34 mg/dL — ABNORMAL HIGH (ref 8–27)
CO2: 23 mmol/L (ref 20–29)
Calcium: 9.3 mg/dL (ref 8.7–10.3)
Chloride: 100 mmol/L (ref 96–106)
Creatinine, Ser: 1.67 mg/dL — ABNORMAL HIGH (ref 0.57–1.00)
Glucose: 88 mg/dL (ref 70–99)
Potassium: 5.6 mmol/L — ABNORMAL HIGH (ref 3.5–5.2)
Sodium: 137 mmol/L (ref 134–144)
eGFR: 33 mL/min/{1.73_m2} — ABNORMAL LOW (ref >59)

## 2022-09-09 ENCOUNTER — Encounter: Payer: Self-pay | Admitting: Family Medicine

## 2022-09-09 ENCOUNTER — Ambulatory Visit (INDEPENDENT_AMBULATORY_CARE_PROVIDER_SITE_OTHER): Payer: Medicare HMO | Admitting: Family Medicine

## 2022-09-09 VITALS — BP 144/79 | HR 89 | Ht 63.0 in | Wt 148.0 lb

## 2022-09-09 DIAGNOSIS — E875 Hyperkalemia: Secondary | ICD-10-CM | POA: Diagnosis not present

## 2022-09-09 DIAGNOSIS — I10 Essential (primary) hypertension: Secondary | ICD-10-CM | POA: Diagnosis not present

## 2022-09-09 DIAGNOSIS — E782 Mixed hyperlipidemia: Secondary | ICD-10-CM

## 2022-09-09 DIAGNOSIS — R110 Nausea: Secondary | ICD-10-CM

## 2022-09-09 DIAGNOSIS — M5136 Other intervertebral disc degeneration, lumbar region: Secondary | ICD-10-CM | POA: Diagnosis not present

## 2022-09-09 MED ORDER — ONDANSETRON HCL 4 MG PO TABS
4.0000 mg | ORAL_TABLET | Freq: Three times a day (TID) | ORAL | 0 refills | Status: AC | PRN
Start: 2022-09-09 — End: ?

## 2022-09-09 MED ORDER — TRAMADOL HCL 50 MG PO TABS: 50.0000 mg | ORAL_TABLET | Freq: Three times a day (TID) | ORAL | 2 refills | Status: DC | PRN

## 2022-09-09 NOTE — Progress Notes (Signed)
BP (!) 144/79   Pulse 89   Ht 5\' 3"  (1.6 m)   Wt 148 lb (67.1 kg)   SpO2 95%   BMI 26.22 kg/m    Subjective:   Patient ID: Maria Moss, female    DOB: May 28, 1950, 72 y.o.   MRN: [id number]  HPI: Maria Moss is a 72 y.o. female presenting on 09/09/2022 for Medical Management of Chronic Issues, Hypertension, and Hyperlipidemia   HPI Hypertension Patient is currently on furosemide and lisinopril, and their blood pressure today is 144/79. Patient denies any lightheadedness or dizziness. Patient denies headaches, blurred vision, chest pains, shortness of breath, or weakness. Denies any side effects from medication and is content with current medication.   Hyperlipidemia Patient is coming in for recheck of his hyperlipidemia. The patient is currently taking Zetia. They deny any issues with myalgias or history of liver damage from it. They deny any focal numbness or weakness or chest pain.   Hyperkalemia Patient has been having high potassium and it was better a couple days ago but still high and we will go ahead and recheck today.  Pain assessment: Cause of pain-degenerative disease Pain location-lumbar Pain on scale of 1-10- 5 Frequency-daily What increases pain-being on her feet What makes pain Better-medicine and rest Effects on ADL -limited some with pain and walking Any change in general medical condition-none  Current opioids rx-tramadol 50 mg 3 times daily as needed # meds rx-90/month Effectiveness of current meds-works well Adverse reactions from pain meds-none Morphine equivalent-15  Pill count performed-No Last drug screen -12/17/2021 ( high risk q74m, moderate risk q36m, low risk yearly ) Urine drug screen today- No Was the NCCSR reviewed-yes  If yes were their any concerning findings? -None Pain contract signed on: 12/17/2021  Relevant past medical, surgical, family and social history reviewed and updated as indicated. Interim medical history since our last visit  reviewed. Allergies and medications reviewed and updated.  Review of Systems  Constitutional:  Negative for chills and fever.  Eyes:  Negative for redness and visual disturbance.  Respiratory:  Negative for chest tightness and shortness of breath.   Cardiovascular:  Negative for chest pain and leg swelling.  Musculoskeletal:  Negative for back pain and gait problem.  Skin:  Negative for rash.  Neurological:  Negative for dizziness, light-headedness and headaches.  Psychiatric/Behavioral:  Negative for agitation and behavioral problems.   All other systems reviewed and are negative.   Per HPI unless specifically indicated above   Allergies as of 09/09/2022       Reactions   Codeine Nausea And Vomiting   Glycopyrrolate    constipation   Sulfa Antibiotics    Hair loss, yellowed skin   Doxycycline Hyclate Itching        Medication List        Accurate as of September 09, 2022  2:43 PM. If you have any questions, ask your nurse or doctor.          acetaminophen 500 MG tablet Commonly known as: TYLENOL Take by mouth.   alum & mag hydroxide-simeth 400-400-40 MG/5ML suspension Commonly known as: MAALOX PLUS Take by mouth.   aspirin EC 81 MG tablet Take 81 mg by mouth daily. Swallow whole.   cetirizine 10 MG tablet Commonly known as: ZYRTEC Take 10 mg by mouth daily.   Cholecalciferol 25 MCG (1000 UT) tablet Take 3,000 Units by mouth daily.   dicyclomine 20 MG tablet Commonly known as: BENTYL Take 20 mg by  mouth every 6 (six) hours.   doxepin 10 MG capsule Commonly known as: SINEQUAN Take 10 mg by mouth at bedtime.   DULoxetine 60 MG capsule Commonly known as: CYMBALTA TAKE 1 CAPSULE EVERY DAY   ezetimibe 10 MG tablet Commonly known as: ZETIA Take 1 tablet (10 mg total) by mouth at bedtime.   famotidine 40 MG tablet Commonly known as: PEPCID one half tablet (20 mg dose) every morning.   FIBER PO Take by mouth.   Fish Oil 1000 MG Caps Take 1,000  mg by mouth daily.   fluticasone 50 MCG/ACT nasal spray Commonly known as: FLONASE USE 2 SPRAYS IN EACH NOSTRIL EVERY DAY   furosemide 20 MG tablet Commonly known as: LASIX TAKE 1 TABLET EVERY DAY   lisinopril 20 MG tablet Commonly known as: ZESTRIL Take 1 tablet (20 mg total) by mouth daily.   loperamide 2 MG tablet Commonly known as: Imodium A-D Take 1 tablet (2 mg total) by mouth 4 (four) times daily as needed for diarrhea or loose stools.   loratadine 10 MG tablet Commonly known as: CLARITIN Take 10 mg by mouth daily.   meloxicam 15 MG tablet Commonly known as: MOBIC TAKE 1 TABLET EVERY DAY   ondansetron 4 MG tablet Commonly known as: Zofran Take 1 tablet (4 mg total) by mouth every 8 (eight) hours as needed for nausea or vomiting.   pantoprazole 40 MG tablet Commonly known as: PROTONIX Take 1 tablet (40 mg total) by mouth daily.   PROBIOTIC & ACIDOPHILUS EX ST PO Take 1 capsule by mouth at bedtime.   traMADol 50 MG tablet Commonly known as: ULTRAM Take 1 tablet (50 mg total) by mouth every 8 (eight) hours as needed (mild pain).         Objective:   BP (!) 144/79   Pulse 89   Ht 5\' 3"  (1.6 m)   Wt 148 lb (67.1 kg)   SpO2 95%   BMI 26.22 kg/m   Wt Readings from Last 3 Encounters:  09/09/22 148 lb (67.1 kg)  09/01/22 150 lb (68 kg)  06/11/22 142 lb (64.4 kg)    Physical Exam Vitals and nursing note reviewed.  Constitutional:      General: She is not in acute distress.    Appearance: She is well-developed. She is not diaphoretic.  Eyes:     Conjunctiva/sclera: Conjunctivae normal.  Cardiovascular:     Rate and Rhythm: Normal rate and regular rhythm.     Heart sounds: Normal heart sounds. No murmur heard. Pulmonary:     Effort: Pulmonary effort is normal. No respiratory distress.     Breath sounds: Normal breath sounds. No wheezing.  Skin:    General: Skin is warm and dry.     Findings: No rash.  Neurological:     Mental Status: She is  alert and oriented to person, place, and time.     Coordination: Coordination normal.  Psychiatric:        Behavior: Behavior normal.     Results for orders placed or performed in visit on 09/07/22  [facility]  Result Value Ref Range   Glucose 88 70 - 99 mg/dL   BUN 34 (H) 8 - 27 mg/dL   Creatinine, Ser 0.86 (H) 0.57 - 1.00 mg/dL   eGFR 33 (L) >57 QI/ONG/2.95   BUN/Creatinine Ratio 20 12 - 28   Sodium 137 134 - 144 mmol/L   Potassium 5.6 (H) 3.5 - 5.2 mmol/L   Chloride 100 96 -  106 mmol/L   CO2 23 20 - 29 mmol/L   Calcium 9.3 8.7 - 10.3 mg/dL    Assessment & Plan:   Problem List Items Addressed This Visit       Cardiovascular and Mediastinum   Hypertension (Chronic)     Musculoskeletal and Integument   Degenerative disc disease, lumbar (Chronic)   Relevant Medications   traMADol (ULTRAM) 50 MG tablet     Other   Hyperlipemia - Primary (Chronic)   Other Visit Diagnoses     Hyperkalemia       Relevant Orders   BMP8+EGFR   Nausea       Relevant Medications   ondansetron (ZOFRAN) 4 MG tablet       Recheck potassium today as it was slightly off last time because of the hyperkalemia.   Follow up plan: Return in about 3 months (around 12/09/2022), or if symptoms worsen or fail to improve, for Hypertension and degenerative disc disease.  Counseling provided for all of the vaccine components Orders Placed This Encounter  Procedures   BMP8+EGFR    Arville Care, MD [facility] 09/09/2022, 2:43 PM

## 2022-09-10 ENCOUNTER — Telehealth: Payer: Self-pay | Admitting: Family Medicine

## 2022-09-10 ENCOUNTER — Other Ambulatory Visit: Payer: Self-pay | Admitting: Family

## 2022-09-10 ENCOUNTER — Other Ambulatory Visit: Payer: Self-pay

## 2022-09-10 ENCOUNTER — Telehealth: Payer: Self-pay

## 2022-09-10 DIAGNOSIS — E875 Hyperkalemia: Secondary | ICD-10-CM

## 2022-09-10 LAB — BMP8+EGFR
BUN/Creatinine Ratio: 20 (ref 12–28)
BUN: 40 mg/dL — ABNORMAL HIGH (ref 8–27)
CO2: 21 mmol/L (ref 20–29)
Calcium: 10 mg/dL (ref 8.7–10.3)
Chloride: 97 mmol/L (ref 96–106)
Creatinine, Ser: 1.98 mg/dL — ABNORMAL HIGH (ref 0.57–1.00)
Glucose: 80 mg/dL (ref 70–99)
Potassium: 6.1 mmol/L (ref 3.5–5.2)
Sodium: 136 mmol/L (ref 134–144)
eGFR: 27 mL/min/{1.73_m2} — ABNORMAL LOW (ref >59)

## 2022-09-10 MED ORDER — SODIUM POLYSTYRENE SULFONATE 15 GM/60ML PO SUSP: 30.0000 g | Freq: Every day | ORAL | 0 refills | Status: DC

## 2022-09-10 MED ORDER — SODIUM POLYSTYRENE SULFONATE PO POWD: Freq: Once | ORAL | 0 refills | Status: DC

## 2022-09-10 NOTE — Telephone Encounter (Signed)
[facility] called with critical lab result.  Potassium 6.1

## 2022-09-10 NOTE — Telephone Encounter (Signed)
Received fax from [facility] requesting clarification on Kayexalate Powder.  Per Maria Moss pt is to take 30g po every day up to 3d.  Clarification sent to pharmacy by fax

## 2022-09-10 NOTE — Telephone Encounter (Signed)
[facility] is out of Kayexalate Powder and also [facility]. Patient needs to know what to do. Please call.

## 2022-09-10 NOTE — Telephone Encounter (Signed)
See result note.  ° °Christy Hawks, FNP ° °

## 2022-09-10 NOTE — Telephone Encounter (Signed)
New rx sent. She needs to stop Mobic because of kidney function.

## 2022-09-10 NOTE — Telephone Encounter (Signed)
Patient aware and verbalized understanding. °

## 2022-09-10 NOTE — Addendum Note (Signed)
Addended by: Jannifer Rodney A on: 09/10/2022 03:00 PM   Modules accepted: Orders

## 2022-09-13 ENCOUNTER — Other Ambulatory Visit: Payer: Self-pay

## 2022-09-13 DIAGNOSIS — D631 Anemia in chronic kidney disease: Secondary | ICD-10-CM | POA: Diagnosis not present

## 2022-09-13 DIAGNOSIS — N184 Chronic kidney disease, stage 4 (severe): Secondary | ICD-10-CM | POA: Diagnosis not present

## 2022-09-13 DIAGNOSIS — E210 Primary hyperparathyroidism: Secondary | ICD-10-CM | POA: Diagnosis not present

## 2022-09-13 DIAGNOSIS — E875 Hyperkalemia: Secondary | ICD-10-CM

## 2022-09-13 DIAGNOSIS — R8290 Unspecified abnormal findings in urine: Secondary | ICD-10-CM | POA: Diagnosis not present

## 2022-09-13 DIAGNOSIS — I129 Hypertensive chronic kidney disease with stage 1 through stage 4 chronic kidney disease, or unspecified chronic kidney disease: Secondary | ICD-10-CM | POA: Diagnosis not present

## 2022-09-16 ENCOUNTER — Other Ambulatory Visit: Payer: Medicare HMO

## 2022-09-16 DIAGNOSIS — I129 Hypertensive chronic kidney disease with stage 1 through stage 4 chronic kidney disease, or unspecified chronic kidney disease: Secondary | ICD-10-CM | POA: Diagnosis not present

## 2022-09-16 DIAGNOSIS — E875 Hyperkalemia: Secondary | ICD-10-CM

## 2022-09-16 DIAGNOSIS — N184 Chronic kidney disease, stage 4 (severe): Secondary | ICD-10-CM | POA: Diagnosis not present

## 2022-09-16 DIAGNOSIS — D631 Anemia in chronic kidney disease: Secondary | ICD-10-CM | POA: Diagnosis not present

## 2022-09-16 DIAGNOSIS — R8290 Unspecified abnormal findings in urine: Secondary | ICD-10-CM | POA: Diagnosis not present

## 2022-09-16 DIAGNOSIS — E210 Primary hyperparathyroidism: Secondary | ICD-10-CM | POA: Diagnosis not present

## 2022-09-17 ENCOUNTER — Telehealth: Payer: Self-pay | Admitting: Family Medicine

## 2022-09-17 ENCOUNTER — Ambulatory Visit: Payer: Medicare HMO | Admitting: Family Medicine

## 2022-09-17 LAB — BMP8+EGFR
BUN/Creatinine Ratio: 18 (ref 12–28)
BUN: 27 mg/dL (ref 8–27)
CO2: 25 mmol/L (ref 20–29)
Calcium: 9.9 mg/dL (ref 8.7–10.3)
Chloride: 99 mmol/L (ref 96–106)
Creatinine, Ser: 1.52 mg/dL — ABNORMAL HIGH (ref 0.57–1.00)
Glucose: 98 mg/dL (ref 70–99)
Potassium: 4.8 mmol/L (ref 3.5–5.2)
Sodium: 138 mmol/L (ref 134–144)
eGFR: 37 mL/min/{1.73_m2} — ABNORMAL LOW (ref >59)

## 2022-09-17 NOTE — Telephone Encounter (Signed)
Have her use tylenol with it and give it a couple weeks and then if still having issues to come in and we can discuss other options

## 2022-09-17 NOTE — Telephone Encounter (Signed)
Pt called to let Dr Dettinger know that her Kidney specialist took her off of the lisinopril and put her back on amlodipine. Pt also wanted to let Dr Dettinger know that she doesn't believe the tramadol Rx is helping her because ever since she stopped taking mobic, she has been in a lot more pain.   Please advise.

## 2022-09-17 NOTE — Telephone Encounter (Signed)
Pt made aware. She will call back if needed.

## 2022-09-18 ENCOUNTER — Other Ambulatory Visit: Payer: Self-pay | Admitting: Family Medicine

## 2022-09-18 DIAGNOSIS — M545 Low back pain, unspecified: Secondary | ICD-10-CM

## 2022-09-18 DIAGNOSIS — F411 Generalized anxiety disorder: Secondary | ICD-10-CM

## 2022-09-22 ENCOUNTER — Other Ambulatory Visit: Payer: Self-pay | Admitting: Internal Medicine

## 2022-09-27 ENCOUNTER — Telehealth: Payer: Self-pay | Admitting: Family Medicine

## 2022-09-27 NOTE — Telephone Encounter (Signed)
Pt wants to let Dr Dettinger know that she no longer wants to take ezetimibe (ZETIA) 10 MG tablet  because it is causing pain in both legs. Pt says that she stopped taking the rx last night for the first time. Please call back

## 2022-09-27 NOTE — Telephone Encounter (Signed)
Patient aware.

## 2022-09-27 NOTE — Telephone Encounter (Signed)
Okay that is fine to stop it if she feels that way but Zetia does not cause those chronic pains because is not a statin so if her pains are still there after she has been off the Zetia then go ahead and try it again

## 2022-09-30 ENCOUNTER — Encounter (HOSPITAL_COMMUNITY): Payer: Self-pay | Admitting: Hematology

## 2022-09-30 DIAGNOSIS — K219 Gastro-esophageal reflux disease without esophagitis: Secondary | ICD-10-CM | POA: Diagnosis not present

## 2022-09-30 DIAGNOSIS — K589 Irritable bowel syndrome without diarrhea: Secondary | ICD-10-CM | POA: Diagnosis not present

## 2022-09-30 NOTE — Progress Notes (Signed)
Orders for labs

## 2022-10-13 DIAGNOSIS — Z23 Encounter for immunization: Secondary | ICD-10-CM | POA: Diagnosis not present

## 2022-10-18 DIAGNOSIS — D631 Anemia in chronic kidney disease: Secondary | ICD-10-CM | POA: Diagnosis not present

## 2022-10-18 DIAGNOSIS — N1832 Chronic kidney disease, stage 3b: Secondary | ICD-10-CM | POA: Diagnosis not present

## 2022-10-18 DIAGNOSIS — E210 Primary hyperparathyroidism: Secondary | ICD-10-CM | POA: Diagnosis not present

## 2022-10-18 DIAGNOSIS — E875 Hyperkalemia: Secondary | ICD-10-CM | POA: Diagnosis not present

## 2022-10-18 DIAGNOSIS — I129 Hypertensive chronic kidney disease with stage 1 through stage 4 chronic kidney disease, or unspecified chronic kidney disease: Secondary | ICD-10-CM | POA: Diagnosis not present

## 2022-11-06 ENCOUNTER — Other Ambulatory Visit: Payer: Self-pay | Admitting: Family Medicine

## 2022-11-29 ENCOUNTER — Encounter: Payer: Self-pay | Admitting: Family Medicine

## 2022-11-29 ENCOUNTER — Ambulatory Visit (INDEPENDENT_AMBULATORY_CARE_PROVIDER_SITE_OTHER): Payer: Medicare HMO | Admitting: Family Medicine

## 2022-11-29 VITALS — BP 133/74 | HR 74 | Ht 63.0 in | Wt 147.0 lb

## 2022-11-29 DIAGNOSIS — E782 Mixed hyperlipidemia: Secondary | ICD-10-CM | POA: Diagnosis not present

## 2022-11-29 DIAGNOSIS — M545 Low back pain, unspecified: Secondary | ICD-10-CM

## 2022-11-29 DIAGNOSIS — I10 Essential (primary) hypertension: Secondary | ICD-10-CM

## 2022-11-29 DIAGNOSIS — G8929 Other chronic pain: Secondary | ICD-10-CM | POA: Diagnosis not present

## 2022-11-29 DIAGNOSIS — F411 Generalized anxiety disorder: Secondary | ICD-10-CM

## 2022-11-29 DIAGNOSIS — M5136 Other intervertebral disc degeneration, lumbar region with discogenic back pain only: Secondary | ICD-10-CM

## 2022-11-29 DIAGNOSIS — R6889 Other general symptoms and signs: Secondary | ICD-10-CM | POA: Diagnosis not present

## 2022-11-29 MED ORDER — EZETIMIBE 10 MG PO TABS: 10.0000 mg | ORAL_TABLET | Freq: Every evening | ORAL | 3 refills | Status: DC

## 2022-11-29 MED ORDER — DULOXETINE HCL 60 MG PO CPEP: 60.0000 mg | ORAL_CAPSULE | Freq: Every day | ORAL | 3 refills | Status: DC

## 2022-11-29 MED ORDER — TRAZODONE HCL 100 MG PO TABS: 100.0000 mg | ORAL_TABLET | Freq: Every day | ORAL | 1 refills | Status: DC

## 2022-11-29 MED ORDER — TRAMADOL HCL 50 MG PO TABS: 50.0000 mg | ORAL_TABLET | Freq: Three times a day (TID) | ORAL | 2 refills | Status: DC | PRN

## 2022-11-29 MED ORDER — DICLOFENAC SODIUM 1 % EX GEL: 2.0000 g | Freq: Four times a day (QID) | CUTANEOUS | 3 refills | Status: DC

## 2022-11-29 NOTE — Progress Notes (Signed)
BP 133/74   Pulse 74   Ht 5\' 3"  (1.6 m)   Wt 147 lb (66.7 kg)   SpO2 98%   BMI 26.04 kg/m    Subjective:   Patient ID: Maria Moss, female    DOB: 1950-03-23, 72 y.o.   MRN: [id number]  HPI: Maria Moss is a 72 y.o. female presenting on 11/29/2022 for Medical Management of Chronic Issues and Hyperlipidemia   HPI Hyperlipidemia and atherosclerosis. Patient is coming in for recheck of his hyperlipidemia. The patient is currently taking Zetia, has not been able to take statin due to intolerance. They deny any issues with myalgias or history of liver damage from it. They deny any focal numbness or weakness or chest pain.   Hypertension Patient is currently on lisinopril, and their blood pressure today is 133/74. Patient denies any lightheadedness or dizziness. Patient denies headaches, blurred vision, chest pains, shortness of breath, or weakness. Denies any side effects from medication and is content with current medication.   Pain and anxiety recheck assessment: Patient currently takes Cymbalta to help with pain and anxiety and also takes doxepin and tramadol, her biggest complaint is that the doxepin was not working so she stopped it and she still not sleeping well. Cause of pain-degenerative disc disease lumbar Pain location-lumbar Pain on scale of 1-10- 5 Frequency-daily What increases pain-standing or being on her feet What makes pain Better-medication and rest Effects on ADL -minimal Any change in general medical condition-none  Current opioids rx-tramadol 50 mg 3 times daily as needed # meds rx-90/month Effectiveness of current meds-works well Adverse reactions from pain meds-none Morphine equivalent-15  Pill count performed-No Last drug screen -12/17/2021 ( high risk q25m, moderate risk q68m, low risk yearly ) Urine drug screen today- Yes Was the NCCSR reviewed-yes  If yes were their any concerning findings? -None Pain contract signed on: Today  Relevant past  medical, surgical, family and social history reviewed and updated as indicated. Interim medical history since our last visit reviewed. Allergies and medications reviewed and updated.  Review of Systems  Constitutional:  Negative for chills and fever.  HENT:  Negative for congestion, ear discharge and ear pain.   Eyes:  Negative for redness and visual disturbance.  Respiratory:  Negative for chest tightness and shortness of breath.   Cardiovascular:  Negative for chest pain and leg swelling.  Genitourinary:  Negative for difficulty urinating and dysuria.  Musculoskeletal:  Positive for arthralgias and back pain. Negative for gait problem.  Skin:  Negative for rash.  Neurological:  Negative for dizziness, light-headedness and headaches.  Psychiatric/Behavioral:  Positive for sleep disturbance. Negative for agitation and behavioral problems. The patient is nervous/anxious.   All other systems reviewed and are negative.   Per HPI unless specifically indicated above   Allergies as of 11/29/2022       Reactions   Codeine Nausea And Vomiting   Glycopyrrolate    constipation   Sulfa Antibiotics    Hair loss, yellowed skin   Doxycycline Hyclate Itching        Medication List        Accurate as of November 29, 2022  3:00 PM. If you have any questions, ask your nurse or doctor.          STOP taking these medications    doxepin 10 MG capsule Commonly known as: SINEQUAN Stopped by: Maria Moss       TAKE these medications    acetaminophen 500 MG tablet  Commonly known as: TYLENOL Take by mouth.   alum & mag hydroxide-simeth 400-400-40 MG/5ML suspension Commonly known as: MAALOX PLUS Take by mouth.   aspirin EC 81 MG tablet Take 81 mg by mouth daily. Swallow whole.   cetirizine 10 MG tablet Commonly known as: ZYRTEC Take 10 mg by mouth daily.   Cholecalciferol 25 MCG (1000 UT) tablet Take 3,000 Units by mouth daily.   diclofenac Sodium 1 % Gel Commonly  known as: Voltaren Apply 2 g topically 4 (four) times daily. Started by: Maria Moss   dicyclomine 20 MG tablet Commonly known as: BENTYL Take 20 mg by mouth every 6 (six) hours.   DULoxetine 60 MG capsule Commonly known as: CYMBALTA Take 1 capsule (60 mg total) by mouth daily.   ezetimibe 10 MG tablet Commonly known as: ZETIA Take 1 tablet (10 mg total) by mouth at bedtime.   famotidine 40 MG tablet Commonly known as: PEPCID one half tablet (20 mg dose) every morning.   FIBER PO Take by mouth.   Fish Oil 1000 MG Caps Take 1,000 mg by mouth daily.   fluticasone 50 MCG/ACT nasal spray Commonly known as: FLONASE USE 2 SPRAYS IN EACH NOSTRIL EVERY DAY   furosemide 20 MG tablet Commonly known as: LASIX TAKE 1 TABLET EVERY DAY   lisinopril 20 MG tablet Commonly known as: ZESTRIL Take 1 tablet (20 mg total) by mouth daily.   loperamide 2 MG tablet Commonly known as: Imodium A-D Take 1 tablet (2 mg total) by mouth 4 (four) times daily as needed for diarrhea or loose stools.   loratadine 10 MG tablet Commonly known as: CLARITIN Take 10 mg by mouth daily.   ondansetron 4 MG tablet Commonly known as: Zofran Take 1 tablet (4 mg total) by mouth every 8 (eight) hours as needed for nausea or vomiting.   pantoprazole 40 MG tablet Commonly known as: PROTONIX Take 1 tablet (40 mg total) by mouth daily.   PROBIOTIC & ACIDOPHILUS EX ST PO Take 1 capsule by mouth at bedtime.   sodium polystyrene 15 GM/60ML suspension Commonly known as: KAYEXALATE Take 120 mLs (30 g total) by mouth daily.   traMADol 50 MG tablet Commonly known as: ULTRAM Take 1 tablet (50 mg total) by mouth every 8 (eight) hours as needed (mild pain).   traZODone 100 MG tablet Commonly known as: DESYREL Take 1 tablet (100 mg total) by mouth at bedtime. Started by: Maria Moss         Objective:   BP 133/74   Pulse 74   Ht 5\' 3"  (1.6 m)   Wt 147 lb (66.7 kg)   SpO2 98%   BMI  26.04 kg/m   Wt Readings from Last 3 Encounters:  11/29/22 147 lb (66.7 kg)  09/09/22 148 lb (67.1 kg)  09/01/22 150 lb (68 kg)    Physical Exam Vitals and nursing note reviewed.  Constitutional:      General: She is not in acute distress.    Appearance: She is well-developed. She is not diaphoretic.  Eyes:     Conjunctiva/sclera: Conjunctivae normal.     Pupils: Pupils are equal, round, and reactive to light.  Cardiovascular:     Rate and Rhythm: Normal rate and regular rhythm.     Heart sounds: Normal heart sounds. No murmur heard. Pulmonary:     Effort: Pulmonary effort is normal. No respiratory distress.     Breath sounds: Normal breath sounds. No wheezing.  Musculoskeletal:  General: No tenderness. Normal range of motion.  Skin:    General: Skin is warm and dry.     Findings: No rash.  Neurological:     Mental Status: She is alert and oriented to person, place, and time.     Coordination: Coordination normal.  Psychiatric:        Behavior: Behavior normal.       Assessment & Plan:   Problem List Items Addressed This Visit       Cardiovascular and Mediastinum   Hypertension (Chronic)   Relevant Medications   ezetimibe (ZETIA) 10 MG tablet   Other Relevant Orders   CMP14+EGFR   Lipid panel     Musculoskeletal and Integument   Degenerative disc disease, lumbar (Chronic)   Relevant Medications   traMADol (ULTRAM) 50 MG tablet   diclofenac Sodium (VOLTAREN) 1 % GEL   Other Relevant Orders   ToxASSURE Select 13 (MW), Urine   CMP14+EGFR   Lipid panel     Other   Hyperlipemia - Primary (Chronic)   Relevant Medications   ezetimibe (ZETIA) 10 MG tablet   Other Relevant Orders   CBC with Differential/Platelet   CMP14+EGFR   Lipid panel   GAD (generalized anxiety disorder) (Chronic)   Relevant Medications   DULoxetine (CYMBALTA) 60 MG capsule   traZODone (DESYREL) 100 MG tablet   Other Relevant Orders   CBC with Differential/Platelet    CMP14+EGFR   Lipid panel   Other Visit Diagnoses     Chronic midline low back pain without sciatica       Relevant Medications   DULoxetine (CYMBALTA) 60 MG capsule   traMADol (ULTRAM) 50 MG tablet   traZODone (DESYREL) 100 MG tablet   Other Relevant Orders   CBC with Differential/Platelet   CMP14+EGFR   Lipid panel     Continue tramadol, will add Voltaren gel to it.  Will do drug screen and blood work today.  Added trazodone to help with anxiety and sleep.  Follow up plan: Return in about 3 months (around 03/01/2023), or if symptoms worsen or fail to improve, for Hypertension and hyperlipidemia and anxiety.  Counseling provided for all of the vaccine components Orders Placed This Encounter  Procedures   ToxASSURE Select 13 (MW), Urine   CBC with Differential/Platelet   CMP14+EGFR   Lipid panel    Arville Care, MD [facility] 11/29/2022, 3:00 PM

## 2022-11-30 LAB — CMP14+EGFR
ALT: 16 [IU]/L (ref 0–32)
AST: 18 [IU]/L (ref 0–40)
Albumin: 4.3 g/dL (ref 3.9–4.9)
Alkaline Phosphatase: 116 [IU]/L (ref 44–121)
BUN/Creatinine Ratio: 22 (ref 12–28)
BUN: 27 mg/dL (ref 8–27)
Bilirubin Total: 0.2 mg/dL (ref 0.0–1.2)
CO2: 25 mmol/L (ref 20–29)
Calcium: 9.9 mg/dL (ref 8.7–10.3)
Chloride: 97 mmol/L (ref 96–106)
Creatinine, Ser: 1.23 mg/dL — ABNORMAL HIGH (ref 0.57–1.00)
Globulin, Total: 2.4 g/dL (ref 1.5–4.5)
Glucose: 75 mg/dL (ref 70–99)
Potassium: 5.2 mmol/L (ref 3.5–5.2)
Sodium: 139 mmol/L (ref 134–144)
Total Protein: 6.7 g/dL (ref 6.0–8.5)
eGFR: 47 mL/min/{1.73_m2} — ABNORMAL LOW (ref >59)

## 2022-11-30 LAB — CBC WITH DIFFERENTIAL/PLATELET
Basophils Absolute: 0 10*3/uL (ref 0.0–0.2)
Basos: 0 %
EOS (ABSOLUTE): 0.4 10*3/uL (ref 0.0–0.4)
Eos: 3 %
Hematocrit: 37.5 % (ref 34.0–46.6)
Hemoglobin: 11.9 g/dL (ref 11.1–15.9)
Immature Grans (Abs): 0 10*3/uL (ref 0.0–0.1)
Immature Granulocytes: 0 %
Lymphocytes Absolute: 2.4 10*3/uL (ref 0.7–3.1)
Lymphs: 24 %
MCH: 29.7 pg (ref 26.6–33.0)
MCHC: 31.7 g/dL (ref 31.5–35.7)
MCV: 94 fL (ref 79–97)
Monocytes Absolute: 1.1 10*3/uL — ABNORMAL HIGH (ref 0.1–0.9)
Monocytes: 11 %
Neutrophils Absolute: 6.3 10*3/uL (ref 1.4–7.0)
Neutrophils: 62 %
Platelets: 545 10*3/uL — ABNORMAL HIGH (ref 150–450)
RBC: 4.01 x10E6/uL (ref 3.77–5.28)
RDW: 12.3 % (ref 11.7–15.4)
WBC: 10.2 10*3/uL (ref 3.4–10.8)

## 2022-11-30 LAB — LIPID PANEL
Chol/HDL Ratio: 3.8 {ratio} (ref 0.0–4.4)
Cholesterol, Total: 225 mg/dL — ABNORMAL HIGH (ref 100–199)
HDL: 59 mg/dL (ref >39)
LDL Chol Calc (NIH): 110 mg/dL — ABNORMAL HIGH (ref 0–99)
Triglycerides: 327 mg/dL — ABNORMAL HIGH (ref 0–149)
VLDL Cholesterol Cal: 56 mg/dL — ABNORMAL HIGH (ref 5–40)

## 2022-12-01 LAB — TOXASSURE SELECT 13 (MW), URINE

## 2022-12-03 ENCOUNTER — Other Ambulatory Visit: Payer: Self-pay | Admitting: Family Medicine

## 2022-12-03 DIAGNOSIS — F411 Generalized anxiety disorder: Secondary | ICD-10-CM

## 2022-12-03 DIAGNOSIS — M545 Low back pain, unspecified: Secondary | ICD-10-CM

## 2022-12-06 ENCOUNTER — Ambulatory Visit: Payer: Self-pay | Admitting: Family Medicine

## 2022-12-06 NOTE — Telephone Encounter (Signed)
Copied from CRM #[id number]. Topic: Clinical - Red Word Triage >> Dec 06, 2022 11:51 AM Fonda Kinder J wrote: Red Word that prompted transfer to Nurse Triage: Swelling and pain, when taking Ezetimibe for high cholesterol   Chief Complaint: Side effects of Ezetimibe and Trazodone unhelpful Symptoms: legs won't "keep still, can't sit still" on Ezetimibe, stopped taking it.  Trazodone, can't sleep Frequency: ongoing on medication; stopped ezetimibe Pertinent Negatives: Patient denies taking ezetimibe Disposition: [] ED /[] Urgent Care (no appt availability in office) / [] Appointment(In office/virtual)/ []  [facility]/ [] Home Care/ [] Refused Recommended Disposition /[] [facility]/ [x]  Follow-up with PCP Additional Notes: The patient inquired if there was a different medication that be prescribed for her elevated cholesterol as the ezetimibe makes her legs feel like she can't keep still.  She stopped taking the medication but she knows her cholesterol was high at her last appointment.  She also asked if her dose of trazodone can be double since she is still unable to sleep at night on the 100 mg dose.  She also wanted to mention that when she was at her last appointment with Dr. Louanne Skye, she couldn't recall the name and practice of her kidney doctor and the medication he prescribed for her bp.   Dr. Threasa Heads [facility] prescribed Carvedilol 6.25 mg twice daily for blood pressure  Reason for Disposition  [1] Caller has NON-URGENT medicine question about med that PCP prescribed AND [2] triager unable to answer question  Answer Assessment - Initial Assessment Questions 1. NAME of MEDICINE: "What medicine(s) are you calling about?"     Ezetimibe is causing side effects and no longer taking, what else can be prescribed?  Trazodone 100 mg is unhelpful.  Still unable to sleep at night.  Should I take 2 to see if that is helpful?  2. QUESTION: "What is your  question?" (e.g., double dose of medicine, side effect)  Ezetimibe:   I cannot take Ezetimibe because of the side effects. What else can be prescribed.  I took the first time he prescribed it for a few days and had to stop because it feels like my legs can't keep still.  I have to get up and do something all the time because it just feels like they can't stay still.  We didn't discuss it so he prescribed it again because my cholesterol was up and the same thing happened after three days so I stopped taking it.  Trazodone: Can I double the dose because 1 was unhelpful  3. PRESCRIBER: "Who prescribed the medicine?" Reason: if prescribed by specialist, call should be referred to that group.     Dr. Louanne Skye  4. SYMPTOMS: "Do you have any symptoms?" If Yes, ask: "What symptoms are you having?"  "How bad are the symptoms (e.g., mild, moderate, severe) Ezetimibe:    Feels like my legs won't be still, I can't sit down Trazodone:  I still can't fall asleep  Protocols used: Medication Question Call-A-AH

## 2022-12-06 NOTE — Telephone Encounter (Signed)
Okay to go ahead and stop the ezetimibe for now although I do not think that is with causing her issue.  I would have her continue the trazodone for at least a couple more weeks because it does take some time to get in her system.  If after a week or 2 of this dose of the trazodone it is not helping still then we can go up on the dose but I would give it a little more time before going up.  Also please add the carvedilol 6.25 mg twice daily onto her active medication list that she got from her kidney doctor.

## 2022-12-06 NOTE — Telephone Encounter (Signed)
Lmtcb.

## 2022-12-07 NOTE — Telephone Encounter (Signed)
Left message for pt to return call.

## 2022-12-08 NOTE — Addendum Note (Signed)
Addended by: [facility] on: 12/08/2022 01:42 PM   Modules accepted: Orders

## 2022-12-08 NOTE — Telephone Encounter (Signed)
Pt r/c.

## 2022-12-08 NOTE — Telephone Encounter (Signed)
Patient aware added to chart

## 2022-12-24 DIAGNOSIS — R195 Other fecal abnormalities: Secondary | ICD-10-CM | POA: Diagnosis not present

## 2022-12-24 DIAGNOSIS — Z8719 Personal history of other diseases of the digestive system: Secondary | ICD-10-CM | POA: Diagnosis not present

## 2022-12-24 DIAGNOSIS — K589 Irritable bowel syndrome without diarrhea: Secondary | ICD-10-CM | POA: Diagnosis not present

## 2022-12-24 DIAGNOSIS — Z9049 Acquired absence of other specified parts of digestive tract: Secondary | ICD-10-CM | POA: Diagnosis not present

## 2022-12-24 DIAGNOSIS — R1030 Lower abdominal pain, unspecified: Secondary | ICD-10-CM | POA: Diagnosis not present

## 2022-12-24 DIAGNOSIS — K5730 Diverticulosis of large intestine without perforation or abscess without bleeding: Secondary | ICD-10-CM | POA: Diagnosis not present

## 2022-12-24 DIAGNOSIS — Z9889 Other specified postprocedural states: Secondary | ICD-10-CM | POA: Diagnosis not present

## 2022-12-24 DIAGNOSIS — K219 Gastro-esophageal reflux disease without esophagitis: Secondary | ICD-10-CM | POA: Diagnosis not present

## 2023-01-03 ENCOUNTER — Telehealth: Payer: Self-pay | Admitting: Family Medicine

## 2023-01-03 MED ORDER — BEMPEDOIC ACID 180 MG PO TABS: 1.0000 | ORAL_TABLET | Freq: Every day | ORAL | 5 refills | Status: DC

## 2023-01-03 NOTE — Telephone Encounter (Signed)
I sent an alternative for the Zetia, the only problem with this medicine as it is probably not going to be cheaper but it is a very good medicine called Nexletol.

## 2023-01-03 NOTE — Telephone Encounter (Signed)
Copied from CRM [id number]. Topic: Clinical - Prescription Issue >> Jan 03, 2023 11:57 AM Geroge Baseman wrote: Reason for CRM: Patient was prescribed a medication by the doc, but she said could not take it. He was going to a new RX to [facility]. Patient has checked with [facility] and they do not havee the RX and patient doesn't know what the medication was called. Please call home phone number [phone] .

## 2023-01-03 NOTE — Telephone Encounter (Signed)
Patient states that she was taking zetia and was unable to tolerate it. She has continued taking other meds with no side effects. She states that you were going to call in an alternative for the zetia. Please advise

## 2023-01-04 NOTE — Telephone Encounter (Signed)
Patient aware and verbalizes understanding. 

## 2023-01-21 ENCOUNTER — Other Ambulatory Visit: Payer: Medicare HMO

## 2023-01-21 DIAGNOSIS — E210 Primary hyperparathyroidism: Secondary | ICD-10-CM | POA: Diagnosis not present

## 2023-01-21 DIAGNOSIS — N1832 Chronic kidney disease, stage 3b: Secondary | ICD-10-CM | POA: Diagnosis not present

## 2023-01-21 DIAGNOSIS — I129 Hypertensive chronic kidney disease with stage 1 through stage 4 chronic kidney disease, or unspecified chronic kidney disease: Secondary | ICD-10-CM | POA: Diagnosis not present

## 2023-01-21 DIAGNOSIS — D631 Anemia in chronic kidney disease: Secondary | ICD-10-CM | POA: Diagnosis not present

## 2023-01-21 DIAGNOSIS — E875 Hyperkalemia: Secondary | ICD-10-CM | POA: Diagnosis not present

## 2023-01-28 ENCOUNTER — Other Ambulatory Visit (HOSPITAL_COMMUNITY): Payer: Self-pay

## 2023-01-28 ENCOUNTER — Ambulatory Visit: Payer: Medicare HMO

## 2023-01-28 ENCOUNTER — Telehealth: Payer: Self-pay | Admitting: Pharmacy Technician

## 2023-01-28 ENCOUNTER — Encounter (HOSPITAL_COMMUNITY): Payer: Self-pay | Admitting: Hematology

## 2023-01-28 VITALS — Ht 63.0 in | Wt 147.0 lb

## 2023-01-28 DIAGNOSIS — Z0000 Encounter for general adult medical examination without abnormal findings: Secondary | ICD-10-CM

## 2023-01-28 NOTE — Progress Notes (Signed)
Subjective:   Maria Moss is a 73 y.o. female who presents for Medicare Annual (Subsequent) preventive examination.  Visit Complete: Virtual I connected with  Maria Moss on 01/28/23 by a audio enabled telemedicine application and verified that I am speaking with the correct person using two identifiers.  Patient Location: Home  Provider Location: Home Office  I discussed the limitations of evaluation and management by telemedicine. The patient expressed understanding and agreed to proceed.  Vital Signs: Because this visit was a virtual/telehealth visit, some criteria may be missing or patient reported. Any vitals not documented were not able to be obtained and vitals that have been documented are patient reported.  Cardiac Risk Factors include: advanced age (>47men, >30 women);hypertension;dyslipidemia     Objective:    Today's Vitals   01/28/23 1605  Weight: 147 lb (66.7 kg)  Height: 5\' 3"  (1.6 m)   Body mass index is 26.04 kg/m.     01/28/2023    4:09 PM 08/27/2022   12:14 PM 02/19/2022    8:29 AM 01/26/2022    3:42 PM 10/19/2021   10:42 AM 09/11/2021    8:03 AM 06/15/2021   11:43 AM  Advanced Directives  Does Patient Have a Medical Advance Directive? No Yes Yes Yes No No Yes  Type of Special educational needs teacher of [place];Living will Healthcare Power of [place];Living will Healthcare Power of [place];Living will   Living will;Healthcare Power of Attorney  Does patient want to make changes to medical advance directive?  No - Patient declined No - Patient declined    No - Patient declined  Copy of Healthcare Power of Attorney in Chart?  No - copy requested No - copy requested No - copy requested   No - copy requested  Would patient like information on creating a medical advance directive? Yes (MAU/Ambulatory/Procedural Areas - Information given)    No - Patient declined No - Patient declined     Current Medications (verified) Outpatient Encounter Medications as of  01/28/2023  Medication Sig   acetaminophen (TYLENOL) 500 MG tablet Take by mouth.   alum & mag hydroxide-simeth (MAALOX PLUS) 400-400-40 MG/5ML suspension Take by mouth.   aspirin EC 81 MG tablet Take 81 mg by mouth daily. Swallow whole.   Bempedoic Acid 180 MG TABS Take 1 tablet (180 mg total) by mouth daily.   carvedilol (COREG) 6.25 MG tablet Take by mouth.   cetirizine (ZYRTEC) 10 MG tablet Take 10 mg by mouth daily.   Cholecalciferol 25 MCG (1000 UT) tablet Take 3,000 Units by mouth daily.    diclofenac Sodium (VOLTAREN) 1 % GEL Apply 2 g topically 4 (four) times daily.   dicyclomine (BENTYL) 20 MG tablet Take 20 mg by mouth every 6 (six) hours.   DULoxetine (CYMBALTA) 60 MG capsule TAKE 1 CAPSULE EVERY DAY   ezetimibe (ZETIA) 10 MG tablet Take 1 tablet (10 mg total) by mouth at bedtime.   famotidine (PEPCID) 40 MG tablet one half tablet (20 mg dose) every morning.   FIBER PO Take by mouth.   fluticasone (FLONASE) 50 MCG/ACT nasal spray USE 2 SPRAYS IN EACH NOSTRIL EVERY DAY   furosemide (LASIX) 20 MG tablet TAKE 1 TABLET EVERY DAY   lisinopril (ZESTRIL) 20 MG tablet Take 1 tablet (20 mg total) by mouth daily.   loperamide (IMODIUM A-D) 2 MG tablet Take 1 tablet (2 mg total) by mouth 4 (four) times daily as needed for diarrhea or loose stools.   loratadine (  CLARITIN) 10 MG tablet Take 10 mg by mouth daily.   Omega-3 Fatty Acids (FISH OIL) 1000 MG CAPS Take 1,000 mg by mouth daily.    ondansetron (ZOFRAN) 4 MG tablet Take 1 tablet (4 mg total) by mouth every 8 (eight) hours as needed for nausea or vomiting.   pantoprazole (PROTONIX) 40 MG tablet Take 1 tablet (40 mg total) by mouth daily.   Probiotic Product (PROBIOTIC & ACIDOPHILUS EX ST PO) Take 1 capsule by mouth at bedtime.    sodium polystyrene (KAYEXALATE) 15 GM/60ML suspension Take 120 mLs (30 g total) by mouth daily.   traMADol (ULTRAM) 50 MG tablet Take 1 tablet (50 mg total) by mouth every 8 (eight) hours as needed (mild pain).    traZODone (DESYREL) 100 MG tablet Take 1 tablet (100 mg total) by mouth at bedtime.   No facility-administered encounter medications on file as of 01/28/2023.    Allergies (verified) Codeine, Glycopyrrolate, Sulfa antibiotics, and Doxycycline hyclate   History: Past Medical History:  Diagnosis Date   Anemia    iron deficiency   Ankle fracture, left 2006   Diverticula, intestine    Mild case.   Fibromyalgia    Dr. Phylliss Bob Dx in [place] years ago  no meds    GERD (gastroesophageal reflux disease)    History of hiatal hernia    Hyperlipidemia    Hyperparathyroidism (HCC)    Hypertension    Dx age 18.    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 has right side weakness toes right foot turns outward and toes curled up some    Past Surgical History:  Procedure Laterality Date   ANKLE SURGERY Left    Broken.  Has screws and metal plate.   CHOLECYSTECTOMY     PARATHYROIDECTOMY Right 05/29/2019   Procedure: RIGHT INFERIOR PARATHYROIDECTOMY;  Surgeon: Darnell Level, MD;  Location: [facility];  Service: General;  Laterality: Right;   ROBOTIC ADRENALECTOMY Right 05/31/2018   Procedure: XI ROBOTIC RIGHT ADRENALECTOMY;  Surgeon: Axel Filler, MD;  Location: [facility];  Service: General;  Laterality: Right;   THYROID LOBECTOMY Right 05/29/2019   Procedure: RIGHT THYROID LOBECTOMY;  Surgeon: Darnell Level, MD;  Location: [facility];  Service: General;  Laterality: Right;   Family History  Problem Relation Age of Onset   Heart disease Mother        CHF   Cancer Mother        Uterine   Osteoporosis Mother    COPD Sister    Asthma Sister    Migraines Sister    Breast cancer Neg Hx    Social History   Socioeconomic History   Marital status: Married    Spouse name: Not on file   Number of children: Not on file   Years of education: Not on file   Highest education level: Not on file  Occupational History   Not on file  Tobacco Use   Smoking status: Never    Smokeless tobacco: Never  Vaping Use   Vaping status: Never Used  Substance and Sexual Activity   Alcohol use: No   Drug use: No   Sexual activity: Not Currently  Other Topics Concern   Not on file  Social History Narrative   Not on file   Social Drivers of Health   Financial Resource Strain: Low Risk  (01/28/2023)   Overall Financial Resource Strain (CARDIA)    Difficulty of Paying Living Expenses: Not hard at  all  Food Insecurity: No Food Insecurity (01/28/2023)   Hunger Vital Sign    Worried About Running Out of Food in the Last Year: Never true    Ran Out of Food in the Last Year: Never true  Transportation Needs: No Transportation Needs (01/28/2023)   PRAPARE - Administrator, Civil Service (Medical): No    Lack of Transportation (Non-Medical): No  Physical Activity: Insufficiently Active (01/28/2023)   Exercise Vital Sign    Days of Exercise per Week: 3 days    Minutes of Exercise per Session: 30 min  Stress: No Stress Concern Present (01/28/2023)   [employer] of Occupational Health - Occupational Stress Questionnaire    Feeling of Stress : Not at all  Social Connections: Socially Integrated (01/28/2023)   Social Connection and Isolation Panel [NHANES]    Frequency of Communication with Friends and Family: More than three times a week    Frequency of Social Gatherings with Friends and Family: Three times a week    Attends Religious Services: More than 4 times per year    Active Member of Clubs or Organizations: Yes    Attends Engineer, structural: More than 4 times per year    Marital Status: Married    Tobacco Counseling Counseling given: Not Answered   Clinical Intake:  Pre-visit preparation completed: Yes  Pain : No/denies pain     Diabetes: No  How often do you need to have someone help you when you read instructions, pamphlets, or other written materials from your doctor or pharmacy?: 1 - Never  Interpreter Needed?:  No  Information entered by :: Kandis Fantasia LPN   Activities of Daily Living    01/28/2023    4:09 PM  In your present state of health, do you have any difficulty performing the following activities:  Hearing? 0  Vision? 0  Difficulty concentrating or making decisions? 0  Walking or climbing stairs? 0  Dressing or bathing? 0  Doing errands, shopping? 0  Preparing Food and eating ? N  Using the Toilet? N  In the past six months, have you accidently leaked urine? N  Do you have problems with loss of bowel control? N  Managing your Medications? N  Managing your Finances? N  Housekeeping or managing your Housekeeping? N    Patient Care Team: Dettinger, Elige Radon, MD as PCP - General (Family Medicine) Doreatha Massed, MD as Consulting Physician (Hematology) Carlus Pavlov, MD as Consulting Physician (Internal Medicine)  Indicate any recent Medical Services you may have received from other than [facility] providers in the past year (date may be approximate).     Assessment:   This is a routine wellness examination for [facility].  Hearing/Vision screen Hearing Screening - Comments:: Denies hearing difficulties   Vision Screening - Comments:: No vision problems; will schedule routine eye exam soon     Goals Addressed             This Visit's Progress    Remain active and independent        Depression Screen    01/28/2023    4:08 PM 11/29/2022    2:49 PM 09/09/2022    2:23 PM 09/01/2022    3:36 PM 06/11/2022   10:21 AM 04/22/2022   10:08 AM 03/05/2022   10:19 AM  PHQ 2/9 Scores  PHQ - 2 Score 0 0 0 0 0 0 0  PHQ- 9 Score 6 6 3 3 2 4  3  Fall Risk    01/28/2023    4:09 PM 11/29/2022    2:49 PM 09/09/2022    2:23 PM 09/01/2022    3:36 PM 06/11/2022   10:21 AM  Fall Risk   Falls in the past year? 0 0 0 0 0  Number falls in past yr: 0      Injury with Fall? 0      Risk for fall due to : No Fall Risks      Follow up Falls prevention discussed;Education provided;Falls  evaluation completed        MEDICARE RISK AT HOME: Medicare Risk at Home Any stairs in or around the home?: No If so, are there any without handrails?: No Home free of loose throw rugs in walkways, pet beds, electrical cords, etc?: Yes Adequate lighting in your home to reduce risk of falls?: Yes Life alert?: No Use of a cane, walker or w/c?: No Grab bars in the bathroom?: Yes Shower chair or bench in shower?: No Elevated toilet seat or a handicapped toilet?: Yes  TIMED UP AND GO:  Was the test performed?  No    Cognitive Function:        01/28/2023    4:09 PM 01/26/2022    3:43 PM 09/09/2020   10:28 AM  6CIT Screen  What Year? 0 points 0 points 0 points  What month? 0 points 0 points 0 points  What time? 0 points 0 points 0 points  Count back from 20 0 points 0 points 0 points  Months in reverse 0 points 0 points 0 points  Repeat phrase 0 points 0 points 0 points  Total Score 0 points 0 points 0 points    Immunizations Immunization History  Administered Date(s) Administered   Fluad Quad(high Dose 65+) 10/13/2018, 11/07/2020   Influenza, High Dose Seasonal PF 10/21/2017   Influenza,inj,Quad PF,6+ Mos 11/03/2012, 10/24/2013, 10/18/2014, 11/19/2015, 11/11/2016   Influenza-Unspecified 10/20/2010, 12/15/2011   Moderna Sars-Covid-2 Vaccination 03/05/2019, 04/02/2019   PFIZER(Purple Top)SARS-COV-2 Vaccination 12/24/2019   Pneumococcal Conjugate-13 04/14/2017   Pneumococcal Polysaccharide-23 10/13/2018   Td 07/30/2013   Tdap 07/30/2013    TDAP status: Up to date  Flu Vaccine status: Up to date  Pneumococcal vaccine status: Up to date  Covid-19 vaccine status: Information provided on how to obtain vaccines.   Qualifies for Shingles Vaccine? Yes   Zostavax completed No   Shingrix Completed?: No.    Education has been provided regarding the importance of this vaccine. Patient has been advised to call insurance company to determine out of pocket expense if they have not  yet received this vaccine. Advised may also receive vaccine at local pharmacy or Health Dept. Verbalized acceptance and understanding.  Screening Tests Health Maintenance  Topic Date Due   Zoster Vaccines- Shingrix (1 of 2) Never done   COVID-19 Vaccine (4 - 2024-25 season) 09/05/2022   MAMMOGRAM  04/12/2023   COLON CANCER SCREENING ANNUAL FOBT  07/02/2023   DTaP/Tdap/Td (3 - Td or Tdap) 07/31/2023   Medicare Annual Wellness (AWV)  01/28/2024   DEXA SCAN  05/02/2024   Colonoscopy  06/11/2031   Pneumonia Vaccine 10+ Years old  Completed   INFLUENZA VACCINE  Completed   Hepatitis C Screening  Completed   HPV VACCINES  Aged Out    Health Maintenance  Health Maintenance Due  Topic Date Due   Zoster Vaccines- Shingrix (1 of 2) Never done   COVID-19 Vaccine (4 - 2024-25 season) 09/05/2022    Colorectal cancer  screening: Type of screening: Colonoscopy. Completed 06/10/21. Repeat every 10 years  Mammogram status: Completed 05/02/22. Repeat every year  Bone Density status: Completed 05/03/22. Results reflect: Bone density results: OSTEOPENIA. Repeat every 2 years.  Lung Cancer Screening: (Low Dose CT Chest recommended if Age 54-80 years, 20 pack-year currently smoking OR have quit w/in 15years.) does not qualify.   Lung Cancer Screening Referral: n/a  Additional Screening:  Hepatitis C Screening: does qualify; Completed 11/29/14  Vision Screening: Recommended annual ophthalmology exams for early detection of glaucoma and other disorders of the eye. Is the patient up to date with their annual eye exam?  No  Who is the provider or what is the name of the office in which the patient attends annual eye exams? none If pt is not established with a provider, would they like to be referred to a provider to establish care? No .   Dental Screening: Recommended annual dental exams for proper oral hygiene  Community Resource Referral / Chronic Care Management: CRR required this visit?  No    CCM required this visit?  No     Plan:     I have personally reviewed and noted the following in the patient's chart:   Medical and social history Use of alcohol, tobacco or illicit drugs  Current medications and supplements including opioid prescriptions. Patient is not currently taking opioid prescriptions. Functional ability and status Nutritional status Physical activity Advanced directives List of other physicians Hospitalizations, surgeries, and ER visits in previous 12 months Vitals Screenings to include cognitive, depression, and falls Referrals and appointments  In addition, I have reviewed and discussed with patient certain preventive protocols, quality metrics, and best practice recommendations. A written personalized care plan for preventive services as well as general preventive health recommendations were provided to patient.     Kandis Fantasia [place], [place]   [id number]   After Visit Summary: (MyChart) Due to this being a telephonic visit, the after visit summary with patients personalized plan was offered to patient via MyChart   Nurse Notes: No concerns at this time

## 2023-01-28 NOTE — Patient Instructions (Signed)
Maria Moss , Thank you for taking time to come for your Medicare Wellness Visit. I appreciate your ongoing commitment to your health goals. Please review the following plan we discussed and let me know if I can assist you in the future.   Referrals/Orders/Follow-Ups/Clinician Recommendations: Aim for 30 minutes of exercise or brisk walking, 6-8 glasses of water, and 5 servings of fruits and vegetables each day.  This is a list of the screening recommended for you and due dates:  Health Maintenance  Topic Date Due   Zoster (Shingles) Vaccine (1 of 2) Never done   COVID-19 Vaccine (4 - 2024-25 season) 09/05/2022   Mammogram  04/12/2023   Stool Blood Test  07/02/2023   DTaP/Tdap/Td vaccine (3 - Td or Tdap) 07/31/2023   Medicare Annual Wellness Visit  01/28/2024   DEXA scan (bone density measurement)  05/02/2024   Colon Cancer Screening  06/11/2031   Pneumonia Vaccine  Completed   Flu Shot  Completed   Hepatitis C Screening  Completed   HPV Vaccine  Aged Out    Advanced directives: (ACP Link)Information on Advanced Care Planning can be found at [facility] [place] Advance Health Care Directives Advance Health Care Directives ([email or web address]   Next Medicare Annual Wellness Visit scheduled for next year: Yes

## 2023-01-28 NOTE — Telephone Encounter (Signed)
Pharmacy Patient Advocate Encounter   Received notification from CoverMyMeds that prior authorization for Nexletol 180MG  tablets is required/requested.   Insurance verification completed.   The patient is insured through [place] .   Per test claim:  atorvastatin, rosuvastain and Repatha is preferred by the insurance.  If suggested medication is appropriate, Please send in a new RX and discontinue this one. If not, please advise as to why it's not appropriate so that we may request a Prior Authorization. Please note, some preferred medications may still require a PA.  If the suggested medications have not been trialed and there are no contraindications to their use, the PA will not be submitted, as it will not be approved.

## 2023-01-31 DIAGNOSIS — E210 Primary hyperparathyroidism: Secondary | ICD-10-CM | POA: Diagnosis not present

## 2023-01-31 DIAGNOSIS — N1832 Chronic kidney disease, stage 3b: Secondary | ICD-10-CM | POA: Diagnosis not present

## 2023-01-31 DIAGNOSIS — E875 Hyperkalemia: Secondary | ICD-10-CM | POA: Diagnosis not present

## 2023-01-31 DIAGNOSIS — I129 Hypertensive chronic kidney disease with stage 1 through stage 4 chronic kidney disease, or unspecified chronic kidney disease: Secondary | ICD-10-CM | POA: Diagnosis not present

## 2023-01-31 DIAGNOSIS — D631 Anemia in chronic kidney disease: Secondary | ICD-10-CM | POA: Diagnosis not present

## 2023-02-10 ENCOUNTER — Other Ambulatory Visit: Payer: Self-pay | Admitting: *Deleted

## 2023-02-10 DIAGNOSIS — D638 Anemia in other chronic diseases classified elsewhere: Secondary | ICD-10-CM

## 2023-02-10 DIAGNOSIS — E538 Deficiency of other specified B group vitamins: Secondary | ICD-10-CM

## 2023-02-10 DIAGNOSIS — D75839 Thrombocytosis, unspecified: Secondary | ICD-10-CM

## 2023-02-10 DIAGNOSIS — D509 Iron deficiency anemia, unspecified: Secondary | ICD-10-CM

## 2023-02-14 ENCOUNTER — Other Ambulatory Visit: Payer: Medicare HMO

## 2023-02-14 DIAGNOSIS — E538 Deficiency of other specified B group vitamins: Secondary | ICD-10-CM

## 2023-02-14 DIAGNOSIS — D75839 Thrombocytosis, unspecified: Secondary | ICD-10-CM | POA: Diagnosis not present

## 2023-02-14 DIAGNOSIS — D509 Iron deficiency anemia, unspecified: Secondary | ICD-10-CM | POA: Diagnosis not present

## 2023-02-14 DIAGNOSIS — D638 Anemia in other chronic diseases classified elsewhere: Secondary | ICD-10-CM | POA: Diagnosis not present

## 2023-02-15 ENCOUNTER — Telehealth: Payer: Self-pay | Admitting: *Deleted

## 2023-02-15 LAB — IRON AND TIBC
Iron Saturation: 18 % (ref 15–55)
Iron: 56 ug/dL (ref 27–139)
Total Iron Binding Capacity: 310 ug/dL (ref 250–450)
UIBC: 254 ug/dL (ref 118–369)

## 2023-02-15 NOTE — Telephone Encounter (Signed)
Copied from CRM [id number]. Topic: Appointments - Scheduling Inquiry for Clinic >> Feb 15, 2023 11:14 AM Maria Moss wrote: Reason for CRM: Pt needs to follow up with PCP per results of recent Lab. Pt needs to be seen as soon as possible. Please reach out to schedule pt [phone]   Notes Per [place], Maria Moss:   "Her routine preappointment labs showed significant elevation in her liver numbers. Maria Moss, Maria Moss has routed them to her PCP.  Patient made aware to contact PCP as soon as possible.  Patient states that nephrologist has put her on Moss new medication.  She has been taking for approx 2 weeks.  Advised that she notify him as well in the event this is causing her liver numbers to be elevated.  Verbalized understanding."

## 2023-02-15 NOTE — Telephone Encounter (Signed)
Called and spoke with patient and made her an appt with PCP this Friday to review recent labs and elevated liver functions

## 2023-02-15 NOTE — Telephone Encounter (Signed)
Her routine preappointment labs showed significant elevation in her liver numbers. Rojelio Brenner, PAC has routed them to her PCP.  Patient made aware to contact PCP as soon as possible.  Patient states that nephrologist has put her on a new medication.  She has been taking for approx 2 weeks.  Advised that she notify him as well in the event this is causing her liver numbers to be elevated.  Verbalized understanding.

## 2023-02-15 NOTE — Progress Notes (Unsigned)
Called patient to relay message from PA. Patient didn't answer phone, but message was left to call the clinic.

## 2023-02-17 LAB — COMPREHENSIVE METABOLIC PANEL
ALT: 236 [IU]/L — ABNORMAL HIGH (ref 0–32)
AST: 172 [IU]/L — ABNORMAL HIGH (ref 0–40)
Albumin: 4.2 g/dL (ref 3.8–4.8)
Alkaline Phosphatase: 264 [IU]/L — ABNORMAL HIGH (ref 44–121)
BUN/Creatinine Ratio: 19 (ref 12–28)
BUN: 23 mg/dL (ref 8–27)
Bilirubin Total: 0.6 mg/dL (ref 0.0–1.2)
CO2: 22 mmol/L (ref 20–29)
Calcium: 9.3 mg/dL (ref 8.7–10.3)
Chloride: 101 mmol/L (ref 96–106)
Creatinine, Ser: 1.21 mg/dL — ABNORMAL HIGH (ref 0.57–1.00)
Globulin, Total: 2.1 g/dL (ref 1.5–4.5)
Glucose: 95 mg/dL (ref 70–99)
Potassium: 4.7 mmol/L (ref 3.5–5.2)
Sodium: 139 mmol/L (ref 134–144)
Total Protein: 6.3 g/dL (ref 6.0–8.5)
eGFR: 48 mL/min/{1.73_m2} — ABNORMAL LOW (ref >59)

## 2023-02-17 LAB — CBC WITH DIFFERENTIAL/PLATELET
Basophils Absolute: 0 10*3/uL (ref 0.0–0.2)
Basos: 0 %
EOS (ABSOLUTE): 0.2 10*3/uL (ref 0.0–0.4)
Eos: 3 %
Hematocrit: 34.5 % (ref 34.0–46.6)
Hemoglobin: 11.7 g/dL (ref 11.1–15.9)
Immature Grans (Abs): 0 10*3/uL (ref 0.0–0.1)
Immature Granulocytes: 0 %
Lymphocytes Absolute: 1.3 10*3/uL (ref 0.7–3.1)
Lymphs: 17 %
MCH: 30.1 pg (ref 26.6–33.0)
MCHC: 33.9 g/dL (ref 31.5–35.7)
MCV: 89 fL (ref 79–97)
Monocytes Absolute: 0.8 10*3/uL (ref 0.1–0.9)
Monocytes: 10 %
Neutrophils Absolute: 5.4 10*3/uL (ref 1.4–7.0)
Neutrophils: 70 %
Platelets: 475 10*3/uL — ABNORMAL HIGH (ref 150–450)
RBC: 3.89 x10E6/uL (ref 3.77–5.28)
RDW: 11.9 % (ref 11.7–15.4)
WBC: 7.7 10*3/uL (ref 3.4–10.8)

## 2023-02-17 LAB — METHYLMALONIC ACID, SERUM: Methylmalonic Acid: 421 nmol/L — ABNORMAL HIGH (ref 0–378)

## 2023-02-17 LAB — VITAMIN B12: Vitamin B-12: 772 pg/mL (ref 232–1245)

## 2023-02-17 LAB — LACTATE DEHYDROGENASE: LDH: 235 IU/L — ABNORMAL HIGH (ref 119–226)

## 2023-02-17 LAB — FERRITIN: Ferritin: 502 ng/mL — ABNORMAL HIGH (ref 15–150)

## 2023-02-18 ENCOUNTER — Ambulatory Visit (INDEPENDENT_AMBULATORY_CARE_PROVIDER_SITE_OTHER): Payer: Medicare HMO | Admitting: Family Medicine

## 2023-02-18 ENCOUNTER — Encounter: Payer: Self-pay | Admitting: Family Medicine

## 2023-02-18 VITALS — BP 115/77 | HR 69 | Temp 98.0°F | Wt 147.2 lb

## 2023-02-18 DIAGNOSIS — R6889 Other general symptoms and signs: Secondary | ICD-10-CM | POA: Diagnosis not present

## 2023-02-18 DIAGNOSIS — R7989 Other specified abnormal findings of blood chemistry: Secondary | ICD-10-CM

## 2023-02-18 NOTE — Progress Notes (Signed)
BP 115/77   Pulse 69   Temp 98 F (36.7 C)   Wt 147 lb 3.2 oz (66.8 kg)   SpO2 98%   BMI 26.08 kg/m    Subjective:   Patient ID: Maria Moss, female    DOB: 06/01/1950, 72 y.o.   MRN: [id number]  HPI: Maria Moss is a 73 y.o. female presenting on 02/18/2023 for Elevated Hepatic Enzymes   HPI Patient comes in with elevated liver enzymes. Patient was seen by oncology and had some blood drawn that showed white significantly elevated liver enzymes a couple of days ago.  She has never had that before.  She has been taking a little more Tylenol recently but she says not more than 6 a day.  She has stopped that over the past couple days after discussing it with oncology.  She has some lower abdominal pain with constipation but that comes and goes and denies anything significant currently.  She does get some back pain but that is chronic.  She denies any nausea or vomiting.  She denies any fevers or chills or blood in stool.  Relevant past medical, surgical, family and social history reviewed and updated as indicated. Interim medical history since our last visit reviewed. Allergies and medications reviewed and updated.  Review of Systems  Constitutional:  Negative for chills and fever.  Eyes:  Negative for visual disturbance.  Respiratory:  Negative for chest tightness and shortness of breath.   Cardiovascular:  Negative for chest pain and leg swelling.  Gastrointestinal:  Positive for abdominal pain and constipation. Negative for blood in stool, diarrhea, nausea and vomiting.  Genitourinary:  Negative for difficulty urinating, dysuria and flank pain.  Musculoskeletal:  Positive for arthralgias and back pain. Negative for gait problem.  Skin:  Negative for rash.  Neurological:  Negative for light-headedness and headaches.  Psychiatric/Behavioral:  Negative for agitation and behavioral problems.   All other systems reviewed and are negative.   Per HPI unless specifically indicated  above   Allergies as of 02/18/2023       Reactions   Codeine Nausea And Vomiting   Glycopyrrolate    constipation   Sulfa Antibiotics    Hair loss, yellowed skin   Doxycycline Hyclate Itching        Medication List        Accurate as of February 18, 2023 11:32 AM. If you have any questions, ask your nurse or doctor.          acetaminophen 500 MG tablet Commonly known as: TYLENOL Take by mouth.   alum & mag hydroxide-simeth 400-400-40 MG/5ML suspension Commonly known as: MAALOX PLUS Take by mouth.   aspirin EC 81 MG tablet Take 81 mg by mouth daily. Swallow whole.   Bempedoic Acid 180 MG Tabs Take 1 tablet (180 mg total) by mouth daily.   carvedilol 6.25 MG tablet Commonly known as: COREG Take by mouth.   cetirizine 10 MG tablet Commonly known as: ZYRTEC Take 10 mg by mouth daily.   Cholecalciferol 25 MCG (1000 UT) tablet Take 3,000 Units by mouth daily.   diclofenac Sodium 1 % Gel Commonly known as: Voltaren Apply 2 g topically 4 (four) times daily.   dicyclomine 20 MG tablet Commonly known as: BENTYL Take 20 mg by mouth every 6 (six) hours.   DULoxetine 60 MG capsule Commonly known as: CYMBALTA TAKE 1 CAPSULE EVERY DAY   ezetimibe 10 MG tablet Commonly known as: ZETIA Take 1 tablet (10 mg  total) by mouth at bedtime.   famotidine 40 MG tablet Commonly known as: PEPCID one half tablet (20 mg dose) every morning.   FIBER PO Take by mouth.   Fish Oil 1000 MG Caps Take 1,000 mg by mouth daily.   fluticasone 50 MCG/ACT nasal spray Commonly known as: FLONASE USE 2 SPRAYS IN EACH NOSTRIL EVERY DAY   furosemide 20 MG tablet Commonly known as: LASIX TAKE 1 TABLET EVERY DAY   lisinopril 20 MG tablet Commonly known as: ZESTRIL Take 1 tablet (20 mg total) by mouth daily.   loperamide 2 MG tablet Commonly known as: Imodium A-D Take 1 tablet (2 mg total) by mouth 4 (four) times daily as needed for diarrhea or loose stools.   loratadine 10  MG tablet Commonly known as: CLARITIN Take 10 mg by mouth daily.   ondansetron 4 MG tablet Commonly known as: Zofran Take 1 tablet (4 mg total) by mouth every 8 (eight) hours as needed for nausea or vomiting.   pantoprazole 40 MG tablet Commonly known as: PROTONIX Take 1 tablet (40 mg total) by mouth daily.   PROBIOTIC & ACIDOPHILUS EX ST PO Take 1 capsule by mouth at bedtime.   sodium polystyrene 15 GM/60ML suspension Commonly known as: KAYEXALATE Take 120 mLs (30 g total) by mouth daily.   telmisartan 20 MG tablet Commonly known as: MICARDIS Take 20 mg by mouth daily.   traMADol 50 MG tablet Commonly known as: ULTRAM Take 1 tablet (50 mg total) by mouth every 8 (eight) hours as needed (mild pain).   traZODone 100 MG tablet Commonly known as: DESYREL Take 1 tablet (100 mg total) by mouth at bedtime.         Objective:   BP 115/77   Pulse 69   Temp 98 F (36.7 C)   Wt 147 lb 3.2 oz (66.8 kg)   SpO2 98%   BMI 26.08 kg/m   Wt Readings from Last 3 Encounters:  02/18/23 147 lb 3.2 oz (66.8 kg)  01/28/23 147 lb (66.7 kg)  11/29/22 147 lb (66.7 kg)    Physical Exam Vitals and nursing note reviewed.  Constitutional:      General: She is not in acute distress.    Appearance: She is well-developed. She is not diaphoretic.  Eyes:     Conjunctiva/sclera: Conjunctivae normal.  Cardiovascular:     Rate and Rhythm: Normal rate and regular rhythm.     Heart sounds: Normal heart sounds. No murmur heard. Pulmonary:     Effort: Pulmonary effort is normal. No respiratory distress.     Breath sounds: Normal breath sounds. No wheezing.  Abdominal:     General: Abdomen is flat. Bowel sounds are normal. There is no distension.     Palpations: Abdomen is soft.     Tenderness: There is abdominal tenderness in the right lower quadrant and left lower quadrant. There is no right CVA tenderness, left CVA tenderness, guarding or rebound.     Hernia: No hernia is present.   Musculoskeletal:        General: No tenderness. Normal range of motion.  Skin:    General: Skin is warm and dry.     Findings: No rash.  Neurological:     Mental Status: She is alert and oriented to person, place, and time.     Coordination: Coordination normal.  Psychiatric:        Behavior: Behavior normal.     Results for orders placed or performed in visit  on 02/14/23  Iron and TIBC   Collection Time: 02/14/23 10:19 AM  Result Value Ref Range   Total Iron Binding Capacity 310 250 - 450 ug/dL   UIBC 161 096 - 045 ug/dL   Iron 56 27 - 409 ug/dL   Iron Saturation 18 15 - 55 %  CBC with Differential/Platelet   Collection Time: 02/14/23 10:24 AM  Result Value Ref Range   WBC 7.7 3.4 - 10.8 x10E3/uL   RBC 3.89 3.77 - 5.28 x10E6/uL   Hemoglobin 11.7 11.1 - 15.9 g/dL   Hematocrit 81.1 91.4 - 46.6 %   MCV 89 79 - 97 fL   MCH 30.1 26.6 - 33.0 pg   MCHC 33.9 31.5 - 35.7 g/dL   RDW 78.2 95.6 - 21.3 %   Platelets 475 (H) 150 - 450 x10E3/uL   Neutrophils 70 Not Estab. %   Lymphs 17 Not Estab. %   Monocytes 10 Not Estab. %   Eos 3 Not Estab. %   Basos 0 Not Estab. %   Neutrophils Absolute 5.4 1.4 - 7.0 x10E3/uL   Lymphocytes Absolute 1.3 0.7 - 3.1 x10E3/uL   Monocytes Absolute 0.8 0.1 - 0.9 x10E3/uL   EOS (ABSOLUTE) 0.2 0.0 - 0.4 x10E3/uL   Basophils Absolute 0.0 0.0 - 0.2 x10E3/uL   Immature Granulocytes 0 Not Estab. %   Immature Grans (Abs) 0.0 0.0 - 0.1 x10E3/uL  Comprehensive metabolic panel   Collection Time: 02/14/23 10:24 AM  Result Value Ref Range   Glucose 95 70 - 99 mg/dL   BUN 23 8 - 27 mg/dL   Creatinine, Ser 0.86 (H) 0.57 - 1.00 mg/dL   eGFR 48 (L) >57 QI/ONG/2.95   BUN/Creatinine Ratio 19 12 - 28   Sodium 139 134 - 144 mmol/L   Potassium 4.7 3.5 - 5.2 mmol/L   Chloride 101 96 - 106 mmol/L   CO2 22 20 - 29 mmol/L   Calcium 9.3 8.7 - 10.3 mg/dL   Total Protein 6.3 6.0 - 8.5 g/dL   Albumin 4.2 3.8 - 4.8 g/dL   Globulin, Total 2.1 1.5 - 4.5 g/dL    Bilirubin Total 0.6 0.0 - 1.2 mg/dL   Alkaline Phosphatase 264 (H) 44 - 121 IU/L   AST 172 (H) 0 - 40 IU/L   ALT 236 (H) 0 - 32 IU/L  Lactate dehydrogenase   Collection Time: 02/14/23 10:24 AM  Result Value Ref Range   LDH 235 (H) 119 - 226 IU/L  Methylmalonic acid, serum   Collection Time: 02/14/23 10:24 AM  Result Value Ref Range   Methylmalonic Acid 421 (H) 0 - 378 nmol/L  Vitamin B12   Collection Time: 02/14/23 10:24 AM  Result Value Ref Range   Vitamin B-12 772 232 - 1,245 pg/mL  Ferritin   Collection Time: 02/14/23 10:24 AM  Result Value Ref Range   Ferritin 502 (H) 15 - 150 ng/mL    Assessment & Plan:   Problem List Items Addressed This Visit   None Visit Diagnoses       Elevated liver function tests    -  Primary   Relevant Orders   CMP14+EGFR   HepB+HepC+HIV Panel   US Abdomen Limited RUQ (LIVER/GB)       Patient had elevated liver function on testing with oncology testing.  She has been taking some Tylenol about 3000 mg daily but she has stopped that over the past couple days.  She has been trying to hydrate as well.  She  has some lower abdominal pain with constipation but denies any upper abdominal pain  Will recheck liver function testing and test for hepatitis panel and HIV.  Will also order liver ultrasound. Follow up plan: Return if symptoms worsen or fail to improve.  Counseling provided for all of the vaccine components Orders Placed This Encounter  Procedures   US Abdomen Limited RUQ (LIVER/GB)   CMP14+EGFR   HepB+HepC+HIV Panel    Arville Care, MD [facility] 02/18/2023, 11:32 AM

## 2023-02-19 LAB — CMP14+EGFR
ALT: 61 [IU]/L — ABNORMAL HIGH (ref 0–32)
AST: 20 [IU]/L (ref 0–40)
Albumin: 4.5 g/dL (ref 3.8–4.8)
Alkaline Phosphatase: 194 [IU]/L — ABNORMAL HIGH (ref 44–121)
BUN/Creatinine Ratio: 28 (ref 12–28)
BUN: 33 mg/dL — ABNORMAL HIGH (ref 8–27)
Bilirubin Total: 0.5 mg/dL (ref 0.0–1.2)
CO2: 27 mmol/L (ref 20–29)
Calcium: 9.9 mg/dL (ref 8.7–10.3)
Chloride: 98 mmol/L (ref 96–106)
Creatinine, Ser: 1.19 mg/dL — ABNORMAL HIGH (ref 0.57–1.00)
Globulin, Total: 2.3 g/dL (ref 1.5–4.5)
Glucose: 88 mg/dL (ref 70–99)
Potassium: 4.9 mmol/L (ref 3.5–5.2)
Sodium: 139 mmol/L (ref 134–144)
Total Protein: 6.8 g/dL (ref 6.0–8.5)
eGFR: 49 mL/min/{1.73_m2} — ABNORMAL LOW (ref >59)

## 2023-02-19 LAB — HEPB+HEPC+HIV PANEL
HIV Screen 4th Generation wRfx: NONREACTIVE
Hep B C IgM: NEGATIVE
Hep B Core Total Ab: NEGATIVE
Hep B E Ab: NONREACTIVE
Hep B E Ag: NEGATIVE
Hep B Surface Ab, Qual: NONREACTIVE
Hep C Virus Ab: NONREACTIVE
Hepatitis B Surface Ag: NEGATIVE

## 2023-02-21 ENCOUNTER — Encounter: Payer: Self-pay | Admitting: Family Medicine

## 2023-02-21 ENCOUNTER — Other Ambulatory Visit: Payer: Medicare HMO

## 2023-02-25 ENCOUNTER — Encounter (HOSPITAL_COMMUNITY): Payer: Self-pay | Admitting: Hematology

## 2023-03-01 ENCOUNTER — Ambulatory Visit (HOSPITAL_COMMUNITY)
Admission: RE | Admit: 2023-03-01 | Discharge: 2023-03-01 | Disposition: A | Discharge status: Home / Self Care | Payer: Medicare HMO | Source: Ambulatory Visit | Attending: Family Medicine | Admitting: Family Medicine

## 2023-03-01 DIAGNOSIS — Z9049 Acquired absence of other specified parts of digestive tract: Secondary | ICD-10-CM | POA: Diagnosis not present

## 2023-03-01 DIAGNOSIS — R7989 Other specified abnormal findings of blood chemistry: Secondary | ICD-10-CM | POA: Insufficient documentation

## 2023-03-02 ENCOUNTER — Ambulatory Visit (HOSPITAL_COMMUNITY): Payer: Medicare HMO

## 2023-03-03 ENCOUNTER — Encounter: Payer: Self-pay | Admitting: Family Medicine

## 2023-03-03 ENCOUNTER — Ambulatory Visit: Payer: Medicare HMO | Admitting: Family Medicine

## 2023-03-03 VITALS — BP 118/66 | HR 65 | Ht 63.0 in | Wt 147.0 lb

## 2023-03-03 DIAGNOSIS — M5136 Other intervertebral disc degeneration, lumbar region with discogenic back pain only: Secondary | ICD-10-CM

## 2023-03-03 DIAGNOSIS — E782 Mixed hyperlipidemia: Secondary | ICD-10-CM | POA: Diagnosis not present

## 2023-03-03 DIAGNOSIS — I10 Essential (primary) hypertension: Secondary | ICD-10-CM | POA: Diagnosis not present

## 2023-03-03 MED ORDER — TRAMADOL HCL 50 MG PO TABS: 50.0000 mg | ORAL_TABLET | Freq: Three times a day (TID) | ORAL | 2 refills | Status: DC | PRN

## 2023-03-03 NOTE — Progress Notes (Signed)
 BP 118/66   Pulse 65   Ht 5\' 3"  (1.6 m)   Wt 147 lb (66.7 kg)   SpO2 96%   BMI 26.04 kg/m    Subjective:   Patient ID: Maria Moss, female    DOB: Mar 15, 1950, 73 y.o.   MRN: [id number]  HPI: Maria Moss is a 73 y.o. female presenting on 03/03/2023 for Medical Management of Chronic Issues, Hyperlipidemia, and Hypertension   HPI Elevated liver function numbers Patient is coming in today again for elevated liver function numbers and discuss blood pressure and other issues.  Her liver function numbers on recheck looks good and had come down nicely.  She did an ultrasound and that was normal.  She denies any upper abdominal pain.  Hypertension, sees cardiology as well. Patient is currently on telmisartan and carvedilol and furosemide, and their blood pressure today is 118/66. Patient denies any lightheadedness or dizziness. Patient denies headaches, blurred vision, chest pains, shortness of breath, or weakness. Denies any side effects from medication and is content with current medication.   Hyperlipidemia Patient is coming in for recheck of his hyperlipidemia. The patient is currently taking fish oil and Zetia. They deny any issues with myalgias or history of liver damage from it. They deny any focal numbness or weakness or chest pain.   Pain assessment: Cause of pain-degenerative disc disease lumbar Pain location-lower back Pain on scale of 1-10- 8 Frequency-daily What increases pain-movement and being on her feet. What makes pain Better-Cymbalta helps and tramadol helps and Tylenol helps Effects on ADL -limited some in hello she can stand and do cleaning but is able to do most things spaced out. Any change in general medical condition-none  Current opioids rx-tramadol 50 mg 3 times daily as needed # meds rx-90 Effectiveness of current meds-helps some Adverse reactions from pain meds-none Morphine equivalent-15  Pill count performed-No Last drug screen -01/11/2023 ( high risk  q31m, moderate risk q16m, low risk yearly ) Urine drug screen today- No Was the [facility] reviewed-yes  If yes were their any concerning findings? -None Pain contract signed on: 01/11/2023  Relevant past medical, surgical, family and social history reviewed and updated as indicated. Interim medical history since our last visit reviewed. Allergies and medications reviewed and updated.  Review of Systems  Constitutional:  Negative for chills and fever.  HENT:  Negative for congestion, ear discharge and ear pain.   Eyes:  Negative for redness and visual disturbance.  Respiratory:  Negative for chest tightness and shortness of breath.   Cardiovascular:  Negative for chest pain and leg swelling.  Genitourinary:  Negative for difficulty urinating and dysuria.  Musculoskeletal:  Positive for arthralgias, back pain and gait problem.  Skin:  Negative for rash.  Neurological:  Negative for light-headedness and headaches.  Psychiatric/Behavioral:  Negative for agitation and behavioral problems.   All other systems reviewed and are negative.   Per HPI unless specifically indicated above   Allergies as of 03/03/2023       Reactions   Codeine Nausea And Vomiting   Glycopyrrolate    constipation   Sulfa Antibiotics    Hair loss, yellowed skin   Doxycycline Hyclate Itching        Medication List        Accurate as of March 03, 2023 11:38 AM. If you have any questions, ask your nurse or doctor.          STOP taking these medications    Bempedoic Acid 180  MG Tabs Stopped by: Elige Radon Sebrina Kessner   lisinopril 20 MG tablet Commonly known as: ZESTRIL Stopped by: Elige Radon Rivaldo Hineman       TAKE these medications    acetaminophen 500 MG tablet Commonly known as: TYLENOL Take by mouth.   alum & mag hydroxide-simeth 400-400-40 MG/5ML suspension Commonly known as: MAALOX PLUS Take by mouth.   aspirin EC 81 MG tablet Take 81 mg by mouth daily. Swallow whole.   carvedilol 6.25 MG  tablet Commonly known as: COREG Take by mouth.   cetirizine 10 MG tablet Commonly known as: ZYRTEC Take 10 mg by mouth daily.   Cholecalciferol 25 MCG (1000 UT) tablet Take 3,000 Units by mouth daily.   diclofenac Sodium 1 % Gel Commonly known as: Voltaren Apply 2 g topically 4 (four) times daily.   dicyclomine 20 MG tablet Commonly known as: BENTYL Take 20 mg by mouth every 6 (six) hours.   DULoxetine 60 MG capsule Commonly known as: CYMBALTA TAKE 1 CAPSULE EVERY DAY   ezetimibe 10 MG tablet Commonly known as: ZETIA Take 1 tablet (10 mg total) by mouth at bedtime.   famotidine 40 MG tablet Commonly known as: PEPCID one half tablet (20 mg dose) every morning.   FIBER PO Take by mouth.   Fish Oil 1000 MG Caps Take 1,000 mg by mouth daily.   fluticasone 50 MCG/ACT nasal spray Commonly known as: FLONASE USE 2 SPRAYS IN EACH NOSTRIL EVERY DAY   furosemide 20 MG tablet Commonly known as: LASIX TAKE 1 TABLET EVERY DAY   loperamide 2 MG tablet Commonly known as: Imodium A-D Take 1 tablet (2 mg total) by mouth 4 (four) times daily as needed for diarrhea or loose stools.   loratadine 10 MG tablet Commonly known as: CLARITIN Take 10 mg by mouth daily.   ondansetron 4 MG tablet Commonly known as: Zofran Take 1 tablet (4 mg total) by mouth every 8 (eight) hours as needed for nausea or vomiting.   pantoprazole 40 MG tablet Commonly known as: PROTONIX Take 1 tablet (40 mg total) by mouth daily.   PROBIOTIC & ACIDOPHILUS EX ST PO Take 1 capsule by mouth at bedtime.   sodium polystyrene 15 GM/60ML suspension Commonly known as: KAYEXALATE Take 120 mLs (30 g total) by mouth daily.   telmisartan 20 MG tablet Commonly known as: MICARDIS Take 20 mg by mouth daily.   traMADol 50 MG tablet Commonly known as: ULTRAM Take 1 tablet (50 mg total) by mouth every 8 (eight) hours as needed (mild pain).   traZODone 100 MG tablet Commonly known as: DESYREL Take 1 tablet  (100 mg total) by mouth at bedtime.         Objective:   BP 118/66   Pulse 65   Ht 5\' 3"  (1.6 m)   Wt 147 lb (66.7 kg)   SpO2 96%   BMI 26.04 kg/m   Wt Readings from Last 3 Encounters:  03/03/23 147 lb (66.7 kg)  02/18/23 147 lb 3.2 oz (66.8 kg)  01/28/23 147 lb (66.7 kg)    Physical Exam Vitals and nursing note reviewed.  Constitutional:      General: She is not in acute distress.    Appearance: She is well-developed. She is not diaphoretic.  Eyes:     Conjunctiva/sclera: Conjunctivae normal.  Cardiovascular:     Rate and Rhythm: Normal rate and regular rhythm.     Heart sounds: Normal heart sounds. No murmur heard. Pulmonary:  Effort: Pulmonary effort is normal. No respiratory distress.     Breath sounds: Normal breath sounds. No wheezing.  Musculoskeletal:        General: No swelling.  Skin:    General: Skin is warm and dry.     Findings: No rash.  Neurological:     Mental Status: She is alert and oriented to person, place, and time.     Coordination: Coordination normal.  Psychiatric:        Behavior: Behavior normal.     Results for orders placed or performed in visit on 02/18/23  CMP14+EGFR   Collection Time: 02/18/23 11:44 AM  Result Value Ref Range   Glucose 88 70 - 99 mg/dL   BUN 33 (H) 8 - 27 mg/dL   Creatinine, Ser 9.14 (H) 0.57 - 1.00 mg/dL   eGFR 49 (L) >78 GN/FAO/1.30   BUN/Creatinine Ratio 28 12 - 28   Sodium 139 134 - 144 mmol/L   Potassium 4.9 3.5 - 5.2 mmol/L   Chloride 98 96 - 106 mmol/L   CO2 27 20 - 29 mmol/L   Calcium 9.9 8.7 - 10.3 mg/dL   Total Protein 6.8 6.0 - 8.5 g/dL   Albumin 4.5 3.8 - 4.8 g/dL   Globulin, Total 2.3 1.5 - 4.5 g/dL   Bilirubin Total 0.5 0.0 - 1.2 mg/dL   Alkaline Phosphatase 194 (H) 44 - 121 IU/L   AST 20 0 - 40 IU/L   ALT 61 (H) 0 - 32 IU/L  HepB+HepC+HIV Panel   Collection Time: 02/18/23 11:44 AM  Result Value Ref Range   Hepatitis B Surface Ag Negative Negative   Hep B E Ag Negative Negative    Hep B C IgM Negative Negative   Hep B Core Total Ab Negative Negative   Hep B E Ab Non Reactive Negative   Hep B Surface Ab, Qual Non Reactive    Hep C Virus Ab Non Reactive Non Reactive   HIV Screen 4th Generation wRfx Non Reactive Non Reactive    Assessment & Plan:   Problem List Items Addressed This Visit       Cardiovascular and Mediastinum   Hypertension (Chronic)     Musculoskeletal and Integument   Degenerative disc disease, lumbar (Chronic)   Relevant Medications   traMADol (ULTRAM) 50 MG tablet     Other   Hyperlipemia - Primary (Chronic)    Patient's liver numbers are improving and she is trying to avoid doing excessive amounts of Tylenol.  Tramadol is helping with pain.  Blood pressure and cholesterol looks good. Follow up plan: Return in about 3 months (around 05/31/2023), or if symptoms worsen or fail to improve, for Pain and hypertension and cholesterol.  Counseling provided for all of the vaccine components No orders of the defined types were placed in this encounter.   Arville Care, MD [facility] 03/03/2023, 11:38 AM

## 2023-03-04 ENCOUNTER — Ambulatory Visit: Payer: Medicare HMO | Admitting: Internal Medicine

## 2023-03-04 ENCOUNTER — Encounter: Payer: Self-pay | Admitting: Internal Medicine

## 2023-03-04 ENCOUNTER — Inpatient Hospital Stay: Payer: Medicare HMO | Attending: Oncology | Admitting: Oncology

## 2023-03-04 ENCOUNTER — Encounter (HOSPITAL_COMMUNITY): Payer: Self-pay | Admitting: Hematology

## 2023-03-04 VITALS — BP 130/70 | HR 81 | Ht 63.0 in | Wt 147.2 lb

## 2023-03-04 DIAGNOSIS — E559 Vitamin D deficiency, unspecified: Secondary | ICD-10-CM

## 2023-03-04 DIAGNOSIS — E213 Hyperparathyroidism, unspecified: Secondary | ICD-10-CM | POA: Diagnosis not present

## 2023-03-04 DIAGNOSIS — Z85850 Personal history of malignant neoplasm of thyroid: Secondary | ICD-10-CM | POA: Diagnosis not present

## 2023-03-04 DIAGNOSIS — E041 Nontoxic single thyroid nodule: Secondary | ICD-10-CM | POA: Diagnosis not present

## 2023-03-04 DIAGNOSIS — D75839 Thrombocytosis, unspecified: Secondary | ICD-10-CM | POA: Diagnosis not present

## 2023-03-04 DIAGNOSIS — E278 Other specified disorders of adrenal gland: Secondary | ICD-10-CM | POA: Diagnosis not present

## 2023-03-04 DIAGNOSIS — E890 Postprocedural hypothyroidism: Secondary | ICD-10-CM

## 2023-03-04 DIAGNOSIS — D638 Anemia in other chronic diseases classified elsewhere: Secondary | ICD-10-CM | POA: Diagnosis not present

## 2023-03-04 DIAGNOSIS — E538 Deficiency of other specified B group vitamins: Secondary | ICD-10-CM

## 2023-03-04 DIAGNOSIS — M8580 Other specified disorders of bone density and structure, unspecified site: Secondary | ICD-10-CM | POA: Diagnosis not present

## 2023-03-04 DIAGNOSIS — D509 Iron deficiency anemia, unspecified: Secondary | ICD-10-CM | POA: Diagnosis not present

## 2023-03-04 NOTE — Progress Notes (Addendum)
 Patient ID: Nuala Alpha, female   DOB: May 07, 1950, 73 y.o.   MRN: [id number]   HPI  GLORISTINE TURRUBIATES is a 73 y.o.-year-old female, initially referred by Dr. Derrell Lolling [facility]), presenting for follow-up for a R adrenal incidentaloma.  She also has a history of primary hyperparathyroidism and a right thyroid nodule which turned out to be a follicular variant of papillary thyroid cancer.  Last visit 1 year ago.  She is here with her husband.  Interim history: No fractures since last visit. She fell 3 days ago >> no fractures. She waks with a cane 2/2 pain in L leg. Since last visit, she developed AKI from Lisinopril >> stopped >> now GFR recovered. LFTs were also elevated >> improving.  Follicular variant of papillary thyroid cancer - Discovered incidentally during investigation for parathyroid adenoma - She had right thyroid lobectomy in 2021-the cancer focus was measuring 1.7 cm, with negative surgical margins. - We did not proceed with RAI treatment.  Thyroid U/S (05/11/2019): Parenchymal Echotexture: Moderately heterogenous Isthmus: 4 mm Right lobe: 6.1 x 2.2 x 3.1 cm Left lobe: 5.1 x 1.2 x 1.6 cm  _______________________________________________________   Estimated total number of nodules >/= 1 cm: 1 _____________________________________________   Nodule # 1: Location: Right; Mid Maximum size: 2.2 cm; Other 2 dimensions: 2.0 x 1.8 cm Composition: spongiform (0) Echogenicity: isoechoic (1) This nodule does NOT meet TI-RADS criteria for biopsy or dedicated follow-up. _________________________________________________________   Moderate thyroid heterogeneity with mixed echogenicity and background pseudo nodularity. No hypervascularity. No regional adenopathy.   IMPRESSION: 2.2 cm right mid thyroid TR 1 spongiform nodule correlates with the cold nodule by the parathyroid scan. This would not meet criteria for any biopsy or follow-up.   Nonspecific thyroid  heterogeneity.  Right thyroid lobectomy (05/29/2019): FINAL MICROSCOPIC DIAGNOSIS:  A. RIGHT THYROID LOBE WITH POSSIBLE RIGHT INFERIOR PARATHYROID: - Papillary thyroid carcinoma, follicular variant, 1.7 cm. - Margins not involved. - See oncology table and comment.  B. PARATHYROID, RIGHT, INFERIOR, PARATHYROIDECTOMY: - Parathyroid adenoma.  ONCOLOGY TABLE:  A. RIGHT THYROID LOBE. THYROID GLAND: Procedure: Right thyroid lobectomy and right inferior parathyroidectomy. Tumor Focality: Unifocal. Tumor Site: Right thyroid lobe, superior pole. Tumor Size: 1.7 x 1.4 x 1.2 cm. Histologic Type: Papillary thyroid carcinoma, follicular variant. Margins: Free of tumor. Angioinvasion: Not present. Lymphatic Invasion: Not present. Extrathyroidal extension: Not identified. Regional Lymph Nodes: No lymph nodes submitted. Pathologic Stage Classification (pTNM, AJCC 8th Edition): pT1b, pNX Representative Tumor Block: A3 Comment(s): The superior pole nodule in the right thyroid lobe is a follicular neoplasm with nuclear features of papillary thyroid carcinoma.  The inferior pole nodule has features of an adenomatous nodule.  Parathyroid tissue is not identified in the right thyroid lobe (specimen A).   We did not proceed with completion thyroidectomy or RAI treatment.  Thyroid U/S (08/29/2020): Prior right-sided hemithyroidectomy. Pathology reports papillary thyroid carcinoma follicular variant  Parenchymal Echotexture: Mildly heterogenous Isthmus: 0.2 cm Right lobe: Surgically absent Left lobe: 4.6 cm x 1.4 cm x 1.6 cm _________________________________________________________  Nodule # 1: Location: Left; Inferior Maximum size: 1.9 cm; Other 2 dimensions: 1.2 cm x 1.2 cm Composition: cannot determine (2) Echogenicity: hypoechoic (2) Nodule meets criteria for biopsy _________________________________________________________  Rounded hypoechoic nodule in the left cervical nodal  station, presumably a lymph node measuring 1.1 cm in long axis.  IMPRESSION: Surgical changes of right hemithyroidectomy.  Left inferior thyroid nodule meets criteria for biopsy, as designated by the newly established ACR TI-RADS criteria, and referral  for biopsy is recommended.  Hypoechoic nodule in the left cervical nodal station, nonspecific though presumably a lymph node, not enlarged.  I suggested biopsy after the above results returned.  Unfortunately, the sample was insufficient for analysis: FNA (09/17/2020): Clinical History: Left inferior, 1.9 cm; Other 2 dimensions: 1.2 cm x  1.2 cm, Hypoechoic, TI-RADS total points: 4  Specimen Submitted:  A. THYROID, LEFT INFERIOR NODULE #1, FINE NEEDLE ASPIRATION:   FINAL MICROSCOPIC DIAGNOSIS:  - Scant follicular epithelium present (Bethesda category I)   FNA (04/01/2021): Clinical History: Left; Inferior 1.9cm; Other 2 dimensions: 1.2cm x  1.2cm cannot determine hypoechoic TI-RADS - 4  Specimen Submitted:  A. THYROID, LEFT INFERIOR, FINE NEEDLE ASPIRATION:   FINAL MICROSCOPIC DIAGNOSIS:  - Atypia of undetermined significance (Bethesda category III)   Afirma: benign  Pt denies: - feeling nodules in neck - hoarseness  - choking + Some dysphagia  - chronic - occasionally  Reviewed her TSH levels: Lab Results  Component Value Date   TSH 2.43 02/26/2022   TSH 2.79 01/23/2021   TSH 2.390 08/08/2020   TSH 2.64 01/25/2020   TSH 2.520 09/17/2019   TSH 3.55 07/20/2019   TSH 0.914 04/14/2017   TSH 1.080 09/04/2015   TSH 0.511 11/02/2013   Osteopenia:  DXA scan ([facility]) from 05/03/2022: L1-L4: -0.3 (+2.7%*) - but the spine is scoliotic and arthritic.  RFN: -1.5 LFN -1.7 33% distal radius:  -1.1 Ultra distal radius:  +0.4  DXA scan ([facility]) from 04/30/2020: L1-L4: n/a RFN: -2.3 LFN: -1.7 33% distal radius, -1.1% (-2.5%*) FRAX: n/a  DXA scan ([facility]) from 04/14/2017: L1-L4:  -1.0 RFN: -1.7 LFN: -1.6 33% distal radius: -0.4 FRAX: 15.8%, 2%  DXA scan ([facility]) from 10/24/2016: L1-L4: -0.5 RFN: -1.0 LFN: -1.2  No history of kidney stones.  Prev. On Fosamax >> stopped 2/2 stomach pain and diarrhea 01/2022.  She tried to restart it but the symptoms recur so she stopped completely.  History of right adrenal incidentaloma: Reviewed previous imaging studies and investigations: Lumbar MRI (10/28/2017): 5.2 x 4.4 R renal or adrenal heterogeneous soft tissue mass  CT abdomen w/ and w/o contrast (11/04/2017): R adrenal 5 cm complex necrotic appearing and enhancing mass with nodular areas of contrast enhancement and scattered areas of calcification >> possible RCC vs. metastasis  PET CT (11/18/2017):  CHEST: No hypermetabolic mediastinal or hilar nodes. No suspicious pulmonary nodules on the CT scan. ABDOMEN/PELVIS: Large RIGHT renal mass again noted measuring 5.0 by 4.5 cm. Mass has central photopenia and mild peripheral metabolic activity with SUV max equal 3.1 (along the medial border of the mass for example.) This mild activity is similar to liver activity (SUV max equal 3.8). The LEFT adrenal glands normal. No other sites of abnormal FDG uptake.  IMPRESSION: 1. Large RIGHT adrenal mass with central photopenia and mild peripheral metabolic activity. Findings are nonspecific. Recommend surgical consultation for lesions of this size. 2. No evidence of primary or metastatic carcinoma outside of the adrenal gland.  Of note, she had normal testosterone, estradiol, 24-hour urine cortisol and 24-hour urine metanephrines and catecholamines before the surgery.  After the surgery, a cosyntropin stimulation test was normal.  Please review my last office visit note for more details of the hormonal investigation.  She had right adrenalectomy by Dr. 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  She feels well after surgery.  A cosyntropin  stimulation test was normal after surgery and cortisol level remained normal afterwards: Component  Latest Ref Rng & Units 09/01/2018 09/01/2018 09/01/2018 02/02/2019         9:45 AM 10:30 AM 10:58 AM   Cortisol, Plasma     ug/dL 32.4 40.1 02.7 25.3   Vitamin D deficiency:  Reviewed vitamin D levels: Lab Results  Component Value Date   VD25OH 57.04 02/26/2022   VD25OH 57.54 01/23/2021   VD25OH 50.4 08/08/2020   VD25OH 50.2 07/20/2019   VD25OH 73.31 02/02/2019   VD25OH 62.95 07/28/2018   VD25OH 29.1 (L) 04/14/2017   VD25OH 43.9 11/02/2013  She was on 3000 units vitamin D daily, now 4000 IU daily, dose increased 12/2019.  History of primary hyperparathyroidism:  Reviewed pertinent labs: Lab Results  Component Value Date   PTH 52 07/20/2019   PTH Comment 07/20/2019   PTH 46 03/05/2019   PTH 54 02/02/2019   PTH Comment 02/02/2019   PTH 41 11/10/2017   PTH Comment 11/10/2017   Lab Results  Component Value Date   CALCIUM 9.9 02/18/2023   CALCIUM 9.3 02/14/2023   CALCIUM 9.9 11/29/2022   CALCIUM 9.9 09/16/2022   CALCIUM 10.0 09/09/2022   CALCIUM 9.3 09/07/2022   CALCIUM 8.9 09/01/2022   CALCIUM 9.1 08/19/2022   CALCIUM 9.6 06/11/2022   CALCIUM 9.6 04/22/2022   CALCIUM 9.3 02/12/2022   CALCIUM 9.1 12/04/2021   CALCIUM 9.7 06/11/2021   CALCIUM 9.7 06/11/2021   CALCIUM 9.9 05/15/2021   CALCIUM 9.7 01/23/2021   CALCIUM 9.6 12/01/2020   CALCIUM 9.3 08/08/2020   CALCIUM 9.5 01/18/2020   CALCIUM 9.3 08/23/2019   CALCIUM 9.7 08/10/2019   CALCIUM 9.6 07/20/2019   CALCIUM 8.7 (L) 05/30/2019   CALCIUM 10.0 05/25/2019   CALCIUM 10.6 (H) 04/16/2019   CALCIUM 11.0 (H) 03/05/2019   CALCIUM 10.4 (H) 02/13/2019   CALCIUM 10.5 (H) 02/02/2019   CALCIUM 10.0 10/13/2018   CALCIUM 10.1 08/24/2018   CALCIUM 10.3 08/17/2018   CALCIUM 8.6 (L) 06/01/2018   CALCIUM 9.9 05/22/2018   CALCIUM 10.3 01/27/2018   CALCIUM 10.5 (H) 11/10/2017   CALCIUM 10.4 (H) 10/21/2017   CALCIUM  10.5 (H) 06/02/2017   CALCIUM 10.4 (H) 04/14/2017   CALCIUM 10.4 (H) 12/16/2016   CALCIUM 10.5 (H) 09/24/2016  PCP suggested to start back on calcium and vitamin D after the bone density results returned 04/2020.  Technetium sestamibi scan (05/03/2019): RIGHT inferior parathyroid adenoma.   Cannot exclude coexistent parathyroid adenoma at the RIGHT superior parathyroid gland.   Potential cold nodule within the RIGHT thyroid lobe near upper pole; consider follow-up thyroid ultrasound assessment to further evaluate.  She had right inferior parathyroidectomy + right thyroid lobectomy (05/29/2019)-Dr. Gerkin.  Pathology showed a parathyroid adenoma and calcium decreased from 11 preop to 9.7 postop  (06/11/2019).  PTH postop was 51.  ROS: + See HPI  I reviewed pt's medications, allergies, PMH, social hx, family hx, and changes were documented in the history of present illness. Otherwise, unchanged from my initial visit note.  Past Medical History:  Diagnosis Date   Anemia    iron deficiency   Ankle fracture, left 2006   Diverticula, intestine    Mild case.   Fibromyalgia    Dr. Phylliss Bob Dx in [place] years ago  no meds    GERD (gastroesophageal reflux disease)    History of hiatal hernia    Hyperlipidemia    Hyperparathyroidism (HCC)    Hypertension    Dx age 69.    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 has right side weakness toes right foot turns outward and toes curled up some     Past Surgical History:  Procedure Laterality Date   ANKLE SURGERY Left    Broken.  Has screws and metal plate.   CHOLECYSTECTOMY     PARATHYROIDECTOMY Right 05/29/2019   Procedure: RIGHT INFERIOR PARATHYROIDECTOMY;  Surgeon: Darnell Level, MD;  Location: [facility];  Service: General;  Laterality: Right;   ROBOTIC ADRENALECTOMY Right 05/31/2018   Procedure: XI ROBOTIC RIGHT ADRENALECTOMY;  Surgeon: Axel Filler, MD;  Location: [facility];  Service: General;  Laterality:  Right;   THYROID LOBECTOMY Right 05/29/2019   Procedure: RIGHT THYROID LOBECTOMY;  Surgeon: Darnell Level, MD;  Location: [facility];  Service: General;  Laterality: Right;    Social History   Socioeconomic History   Marital status: Married    Spouse name: Not on file   Number of children: Not on file   Years of education: Not on file   Highest education level: Not on file  Occupational History   Not on file  Tobacco Use   Smoking status: Never   Smokeless tobacco: Never  Vaping Use   Vaping status: Never Used  Substance and Sexual Activity   Alcohol use: No   Drug use: No   Sexual activity: Not Currently  Other Topics Concern   Not on file  Social History Narrative   Not on file   Social Drivers of Health   Financial Resource Strain: Low Risk  (01/28/2023)   Overall Financial Resource Strain (CARDIA)    Difficulty of Paying Living Expenses: Not hard at all  Food Insecurity: No Food Insecurity (01/28/2023)   Hunger Vital Sign    Worried About Running Out of Food in the Last Year: Never true    Ran Out of Food in the Last Year: Never true  Transportation Needs: No Transportation Needs (01/28/2023)   PRAPARE - Administrator, Civil Service (Medical): No    Lack of Transportation (Non-Medical): No  Physical Activity: Insufficiently Active (01/28/2023)   Exercise Vital Sign    Days of Exercise per Week: 3 days    Minutes of Exercise per Session: 30 min  Stress: No Stress Concern Present (01/28/2023)   [employer] of Occupational Health - Occupational Stress Questionnaire    Feeling of Stress : Not at all  Social Connections: Socially Integrated (01/28/2023)   Social Connection and Isolation Panel [NHANES]    Frequency of Communication with Friends and Family: More than three times a week    Frequency of Social Gatherings with Friends and Family: Three times a week    Attends Religious Services: More than 4 times per year    Active Member of Clubs or  Organizations: Yes    Attends Banker Meetings: More than 4 times per year    Marital Status: Married  Catering manager Violence: Not At Risk (01/28/2023)   Humiliation, Afraid, Rape, and Kick questionnaire    Fear of Current or Ex-Partner: No    Emotionally Abused: No    Physically Abused: No    Sexually Abused: No    Current Outpatient Medications on File Prior to Visit  Medication Sig Dispense Refill   acetaminophen (TYLENOL) 500 MG tablet Take by mouth.     alum & mag hydroxide-simeth (MAALOX PLUS) 400-400-40 MG/5ML suspension Take by mouth.     aspirin EC 81 MG tablet Take 81 mg by mouth daily. Swallow whole.  carvedilol (COREG) 6.25 MG tablet Take by mouth.     cetirizine (ZYRTEC) 10 MG tablet Take 10 mg by mouth daily.     Cholecalciferol 25 MCG (1000 UT) tablet Take 3,000 Units by mouth daily.      diclofenac Sodium (VOLTAREN) 1 % GEL Apply 2 g topically 4 (four) times daily. 350 g 3   dicyclomine (BENTYL) 20 MG tablet Take 20 mg by mouth every 6 (six) hours.     DULoxetine (CYMBALTA) 60 MG capsule TAKE 1 CAPSULE EVERY DAY 90 capsule 2   ezetimibe (ZETIA) 10 MG tablet Take 1 tablet (10 mg total) by mouth at bedtime. 90 tablet 3   famotidine (PEPCID) 40 MG tablet one half tablet (20 mg dose) every morning.     FIBER PO Take by mouth.     fluticasone (FLONASE) 50 MCG/ACT nasal spray USE 2 SPRAYS IN EACH NOSTRIL EVERY DAY 48 g 3   furosemide (LASIX) 20 MG tablet TAKE 1 TABLET EVERY DAY 90 tablet 3   loperamide (IMODIUM A-D) 2 MG tablet Take 1 tablet (2 mg total) by mouth 4 (four) times daily as needed for diarrhea or loose stools. 30 tablet 0   loratadine (CLARITIN) 10 MG tablet Take 10 mg by mouth daily.     Omega-3 Fatty Acids (FISH OIL) 1000 MG CAPS Take 1,000 mg by mouth daily.      ondansetron (ZOFRAN) 4 MG tablet Take 1 tablet (4 mg total) by mouth every 8 (eight) hours as needed for nausea or vomiting. 20 tablet 0   pantoprazole (PROTONIX) 40 MG tablet Take 1  tablet (40 mg total) by mouth daily. 90 tablet 3   Probiotic Product (PROBIOTIC & ACIDOPHILUS EX ST PO) Take 1 capsule by mouth at bedtime.      sodium polystyrene (KAYEXALATE) 15 GM/60ML suspension Take 120 mLs (30 g total) by mouth daily. 240 mL 0   telmisartan (MICARDIS) 20 MG tablet Take 20 mg by mouth daily.     traMADol (ULTRAM) 50 MG tablet Take 1 tablet (50 mg total) by mouth every 8 (eight) hours as needed (mild pain). 90 tablet 2   traZODone (DESYREL) 100 MG tablet Take 1 tablet (100 mg total) by mouth at bedtime. 90 tablet 1   No current facility-administered medications on file prior to visit.    Allergies  Allergen Reactions   Codeine Nausea And Vomiting   Glycopyrrolate     constipation   Sulfa Antibiotics     Hair loss, yellowed skin   Doxycycline Hyclate Itching    Family History  Problem Relation Age of Onset   Heart disease Mother        CHF   Cancer Mother        Uterine   Osteoporosis Mother    COPD Sister    Asthma Sister    Migraines Sister    Breast cancer Neg Hx    PE: There were no vitals taken for this visit. Wt Readings from Last 3 Encounters:  03/03/23 147 lb (66.7 kg)  02/18/23 147 lb 3.2 oz (66.8 kg)  01/28/23 147 lb (66.7 kg)   Constitutional: normal weight, in NAD Eyes: EOMI, no exophthalmos ENT: no neck masses palpable, no cervical lymphadenopathy Cardiovascular: RRR, No MRG, + B LE edema (wears compression hoses) Respiratory: CTA B Musculoskeletal: no deformities Skin: moist, warm, no rashes Neurological: + Mild tremor with outstretched hands  ASSESSMENT: 1. Follicular variant of papillary thyroid cancer  2. S/p right thyroid lobectomy  3.  Left thyroid nodule  4. Vitamin D deficiency   5.  Osteopenia  Inactive problems:  6.  History of primary hyperparathyroidism -Patient with history of hypercalcemia and nonsuppressed PTH, with technetium sestamibi scan positive for a R inf.  adenoma.  This was resected and calcium  decreased to normal after surgery. -Postop, calcium and PTH levels were normal -Latest serum level was reviewed and this was also normal: Lab Results  Component Value Date   CALCIUM 9.9 02/18/2023   PHOS 3.6 03/05/2019  -We will not repeat this today  7.  History of adrenal incidentaloma Patient with a history of right adrenal nodule discovered incidentally. This had necrotic areas and scattered areas of calcifications, and measured 5 x 4.5 cm.  The PET CT scan did not show increased FDG uptake to suggest malignancy.  We ruled out adrenal hormone hyper secretion and I cleared her for surgery.  She had mass resected and the pathology was benign (cavernous hemangioma).  After surgery, her right leg pain resolved. -Postsurgical cosyntropin stimulation test was normal, ruling out adrenal insufficiency -No further investigation is needed for this  PLAN:  1. Follicular variant of papillary thyroid cancer, 2. S/p R hemithyroidectomy , 3. L thyroid nodule -Patient has a follicular variant of papillary thyroid cancer which was smaller than 2 cm and without any signs of extension or invasion in the lymphovascular system, and therefore low risk -Because of the low risk, completion thyroidectomy or RAI treatment were not mandatory -She has not no neck compression symptoms or masses on palpation of her neck -On the thyroid ultrasound report from 08/2020, she had a left thyroid nodule which we tried to biopsy with inconclusive results in the past due to insufficient sample.  The biopsy was uncomfortable and she still has some sensitivity at the site long afterwards.  This finally resolved.  She agreed to repeat another FNA in 04/2021.  The results were inconclusive, however the Afirma molecular marker returned benign -At today's visit, we will repeat another thyroid ultrasound -Of note, her TSH was normal after hemithyroidectomy: -We will repeat this today.  She usually takes her skin and nails vitamins (600  mcg of biotin).  We discussed at last visit not to take it in the day of the labs. Lab Results  Component Value Date   TSH 2.43 02/26/2022  -No hypothyroid symptoms -will repeat the TSH today -We did cussed at last visit about how to take levothyroxine in case she needs to start: Every day, with water, at least 30 minutes before breakfast, separated by at least 4 hours from: - acid reflux medications - calcium - iron - multivitamins  4.  Vitamin D deficiency -On 4000 units vitamin D daily -Latest vitamin D level was normal Lab Results  Component Value Date   VD25OH 57.04 02/26/2022  -We will repeat this today  5.  Osteopenia -Reviewed her DXA scan reports from 2018, 2019, and 04/2020. On the 2022 DXA, the right femoral neck bone density was significantly worse, as well as the 33% distal radius BMD.  We discussed about options for treatment.  I expected her bone density to improve after parathyroid surgery but I also suggested oral alendronate once a week.  She started this in 08/2020 but developed abdominal pain and diarrhea and had to stop.  She retried to start it but the symptoms recurred and she came off 01/2022.   -At last visit we discussed about Reclast versus Prolia.  Discussed about the differences.  We decided to use Reclast due to convenience.  Past about this at last visit but wanted to wait for the bone density scan to return first. -We repeated a bone density scan on 05/03/2022 and this showed significant improvement at the lumbar spine. The spine is scoliotic and arthritic and calcium deposits in this sites can confound bone density measurements, however, the ultra distal radius can be used as a surrogate marker for trabecular bone and the bone density at this site was +0.4, normal. There is also significant improvement at the right femoral neck.  The left femoral neck and 33% distal radius sites are stable. -Due to the improvement in bone density, we discussed about waiting for  the new bone density scan to see if we need to start Prolia or Reclast, depending on the kidney function. -For now, we will continue to observe on vitamin D supplements.  I also recommended weightbearing exercises.  We discussed about doing this at least 30 minutes a day for 5 days a week.  Daughter can help her with this.  They do have weights at home. -I will see her back in a year  Orders Placed This Encounter  Procedures   US THYROID   Vitamin D, 25-hydroxy   TSH   Component     Latest Ref Rng 03/04/2023  TSH     0.40 - 4.50 mIU/L 2.35   Vitamin D, 25-Hydroxy     30 - 100 ng/mL 65   Normal.  Carlus Pavlov, MD PhD [facility] Endocrinology

## 2023-03-04 NOTE — Progress Notes (Addendum)
 Virtual Visit via Telephone Note  I connected with Maria Moss on 03/07/23 at  2:00 PM EST by telephone and verified that I am speaking with the correct person using two identifiers.  Location: Patient: Home  Provider: Home Office   I discussed the limitations, risks, security and privacy concerns of performing an evaluation and management service by telephone and the availability of in person appointments. I also discussed with the patient that there may be a patient responsible charge related to this service. The patient expressed understanding and agreed to proceed.   History of Present Illness: Maria Moss is a 73 year old female with past medical history significant for normocytic anemia and thrombocytosis who was last seen in clinic on 08/27/2022.   In the interim, she denies any hospitalizations, surgeries or changes to her baseline health.  She is followed closely by her PCP Dr. Louanne Skye.  More recently, she is found to have elevated liver enzymes.  She reported some lower abdominal pain mainly in the right and left lower quadrants.  She self reported taking some extra Tylenol over the past couple of days which she has since stopped.  PCP ordered abdominal ultrasound which did not reveal any abnormalities.  Repeat LFTs were significantly better.  Earlier today, she was seen by Dr. Lafe Garin for history of follicular variant of papillary thyroid cancer.  Plan is to have some additional imaging of her thyroid on 03/11/2023.  Today, she reports doing well.  At her last visit, she was instructed to start B12 supplements which she has been taking.  Reports she cannot be days ago but did not injure herself.  She uses a cane for ambulation.   Observations/Objective: Review of Systems  Constitutional:  Positive for malaise/fatigue. Negative for weight loss.  Musculoskeletal:  Positive for falls and joint pain.  Neurological:  Positive for dizziness.    Physical Exam Neurological:     Mental  Status: She is alert and oriented to person, place, and time.       Assessment and Plan: 1. Thrombocytosis (Primary) -Appears she has had elevated platelet counts for years. -Previous hematology workup was negative for JAK2 with reflex testing.  BCR able FISH was negative. -She is a lifelong smoker. -Platelet counts have been elevated independent of iron levels. -LDH is slightly elevated.  CRP has been normal. -Patient had bone marrow biopsy on 09/11/2021 which showed hypercellular bone marrow with mild megakaryocytic hyperplasia.  Per pathologist, this still could represent a reactive process, the persistence of her thrombocytosis is concerning and repeat BCR/ABL and NexGen myeloid panel were recommended.  Marrow showed increased histiocytic iron stores  Cytogenetics with normal female karyotype 46, XX[20] BCR/ABL negative.  JAK2 V617F negative.  JAK2 exon 12-13 negative.  CALR negative. 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  In some studies of patients with confirmed JAK2 MPN's, additional mutation of NF1 cause increased severity of MPN.  However, little data concerning isolated mutations of NF1 alone. -Will continue active surveillance. - No indication for cytoreductive therapy at this time.  Can continue active surveillance, but would consider Hydrea if patient had platelet count over 1 million.  - Continue aspirin 81 mg daily to decrease risk of VTE in setting of thrombocytosis. - Repeat labs (PCP) RTC in 6 months.  2. Iron deficiency anemia, unspecified iron deficiency  anemia type -This is a combination of CKD and iron deficiency state. - EGD (09/20/2019): Gastritis, gastric polyp, hiatal hernia - She had hiatal hernia repair November 2021 - Last Feraheme infusion  was on 10/26/2019 and 11/02/2019 -  She reports occasional "dark and sticky" stools, but denies any black bowel movements.  No bright red blood per rectum. - She had a positive Cologuard test on 03/11/2021. - Colonoscopy at [facility] on 06/10/2021 - review of results via [facility] Every[facility] which showed internal hemorrhoids, few diverticula and nonspecific bulbous ileocecal valve which was biopsied.  I am not able to see pathology results. - She complains of significant fatigue, which is chronic -Labs from 02/14/23 show hemoglobin of 11.7, hematocrit 34.5.  Differential is unremarkable.  Ferritin is elevated at 502.  Iron saturations 18%, TIBC 310.  B12 772 with MMA 421. -No additional IV iron needed at this time.  Recommend follow-up in approximately 6 months.  3.  Elevated MMA: -Vitamin B12 level has been normal.  Last B12 level was 772 from 02/14/2023. -MMA over the course of the past 6 months has been elevated most recent from 02/14/2023 was 421 indicating she is likely still B12 deficient.  Continue oral B12 supplements.  We did discuss possibly getting monthly B12 injections but given she lives in [place], she feels this might be too much traveling.  I have asked that she discuss with her daughter who may be able to give her B12 injections at home.  She will call the clinic and let us know.  Follow Up Instructions: Return to clinic in 6 months with labs a few days before and virtual visit.   I discussed the assessment and treatment plan with the patient. The patient was provided an opportunity to ask questions and all were answered. The patient agreed with the plan and demonstrated an understanding of the instructions.   The patient was advised to call back or seek an in-person evaluation if the symptoms worsen or if the condition fails to improve as anticipated.  I provided 20 minutes of non-face-to-face time during this encounter.   Mauro Kaufmann, NP

## 2023-03-04 NOTE — Patient Instructions (Addendum)
 Please stop at the lab.  If we need to start thyroid hormone (Levothyroxine), Take it every day, with water, at least 30 minutes before breakfast, separated by at least 4 hours from: - acid reflux medications - calcium - iron - multivitamins  Continue vitamin D 4000 units daily.  Let's schedule a new thyroid U/S.  Please come back for a follow-up appointment in 1 year.

## 2023-03-05 LAB — TSH: TSH: 2.35 m[IU]/L (ref 0.40–4.50)

## 2023-03-05 LAB — VITAMIN D 25 HYDROXY (VIT D DEFICIENCY, FRACTURES): Vit D, 25-Hydroxy: 65 ng/mL (ref 30–100)

## 2023-03-07 ENCOUNTER — Encounter: Payer: Self-pay | Admitting: Internal Medicine

## 2023-03-11 ENCOUNTER — Other Ambulatory Visit: Payer: Medicare HMO

## 2023-03-17 ENCOUNTER — Other Ambulatory Visit: Payer: Self-pay | Admitting: Family Medicine

## 2023-03-17 DIAGNOSIS — M5136 Other intervertebral disc degeneration, lumbar region with discogenic back pain only: Secondary | ICD-10-CM

## 2023-03-18 ENCOUNTER — Other Ambulatory Visit: Payer: Self-pay | Admitting: Oncology

## 2023-03-18 ENCOUNTER — Other Ambulatory Visit

## 2023-03-18 MED ORDER — CYANOCOBALAMIN 1000 MCG/ML IJ SOLN: 1000.0000 ug | INTRAMUSCULAR | 0 refills | Status: DC

## 2023-03-25 ENCOUNTER — Encounter: Payer: Self-pay | Admitting: Internal Medicine

## 2023-04-01 ENCOUNTER — Ambulatory Visit
Admission: RE | Admit: 2023-04-01 | Discharge: 2023-04-01 | Disposition: A | Discharge status: Home / Self Care | Source: Ambulatory Visit | Attending: Internal Medicine | Admitting: Internal Medicine

## 2023-04-01 DIAGNOSIS — Z85850 Personal history of malignant neoplasm of thyroid: Secondary | ICD-10-CM

## 2023-04-01 DIAGNOSIS — E041 Nontoxic single thyroid nodule: Secondary | ICD-10-CM

## 2023-04-05 ENCOUNTER — Encounter: Payer: Self-pay | Admitting: Internal Medicine

## 2023-04-27 ENCOUNTER — Other Ambulatory Visit

## 2023-04-27 DIAGNOSIS — D631 Anemia in chronic kidney disease: Secondary | ICD-10-CM | POA: Diagnosis not present

## 2023-04-27 DIAGNOSIS — N1832 Chronic kidney disease, stage 3b: Secondary | ICD-10-CM | POA: Diagnosis not present

## 2023-04-27 DIAGNOSIS — N184 Chronic kidney disease, stage 4 (severe): Secondary | ICD-10-CM | POA: Diagnosis not present

## 2023-04-27 DIAGNOSIS — E875 Hyperkalemia: Secondary | ICD-10-CM | POA: Diagnosis not present

## 2023-04-27 DIAGNOSIS — E210 Primary hyperparathyroidism: Secondary | ICD-10-CM | POA: Diagnosis not present

## 2023-04-27 DIAGNOSIS — R8290 Unspecified abnormal findings in urine: Secondary | ICD-10-CM | POA: Diagnosis not present

## 2023-04-27 DIAGNOSIS — I129 Hypertensive chronic kidney disease with stage 1 through stage 4 chronic kidney disease, or unspecified chronic kidney disease: Secondary | ICD-10-CM | POA: Diagnosis not present

## 2023-05-02 ENCOUNTER — Other Ambulatory Visit: Payer: Self-pay | Admitting: Family Medicine

## 2023-05-02 DIAGNOSIS — D631 Anemia in chronic kidney disease: Secondary | ICD-10-CM | POA: Diagnosis not present

## 2023-05-02 DIAGNOSIS — N1832 Chronic kidney disease, stage 3b: Secondary | ICD-10-CM | POA: Diagnosis not present

## 2023-05-02 DIAGNOSIS — E875 Hyperkalemia: Secondary | ICD-10-CM | POA: Diagnosis not present

## 2023-05-02 DIAGNOSIS — I129 Hypertensive chronic kidney disease with stage 1 through stage 4 chronic kidney disease, or unspecified chronic kidney disease: Secondary | ICD-10-CM | POA: Diagnosis not present

## 2023-05-02 DIAGNOSIS — K219 Gastro-esophageal reflux disease without esophagitis: Secondary | ICD-10-CM

## 2023-05-02 DIAGNOSIS — E210 Primary hyperparathyroidism: Secondary | ICD-10-CM | POA: Diagnosis not present

## 2023-05-05 ENCOUNTER — Ambulatory Visit: Admitting: Orthopaedic Surgery

## 2023-05-05 ENCOUNTER — Encounter: Payer: Self-pay | Admitting: Orthopaedic Surgery

## 2023-05-05 ENCOUNTER — Other Ambulatory Visit: Payer: Self-pay

## 2023-05-05 DIAGNOSIS — M545 Low back pain, unspecified: Secondary | ICD-10-CM

## 2023-05-05 DIAGNOSIS — M79605 Pain in left leg: Secondary | ICD-10-CM

## 2023-05-05 NOTE — Progress Notes (Signed)
 Subjective:    Patient ID: Maria Moss, female    DOB: 30-Apr-1950, 73 y.o.   MRN: [id number]  HPI She complains of lower back pain with some left sided sciatica that does not go past the left knee.  She has had back pain for years.  She saw Dr. Murrel Arnt for this in 2020.  She had adrenal gland problem on the right in the past but has done well from this.  She has no new trauma. She has no weakness.  She says it is difficult for her to walk distances and to stand a while.  She had been on Tramadol  which did not help. She has used patches to the back which help some, Tylenol  does not help, heat and ice are just transient relief.   Review of Systems  Constitutional:  Positive for activity change.  Musculoskeletal:  Positive for arthralgias, back pain and myalgias.  All other systems reviewed and are negative. For Review of Systems, all other systems reviewed and are negative.  The following is a summary of the past history medically, past history surgically, known current medicines, social history and family history.  This information is gathered electronically by the computer from prior information and documentation.  I review this each visit and have found including this information at this point in the chart is beneficial and informative.   Past Medical History:  Diagnosis Date   Anemia    iron deficiency   Ankle fracture, left 2006   Diverticula, intestine    Mild case.   Fibromyalgia    Dr. Areatha Ku Dx in  years ago  no meds    GERD (gastroesophageal reflux disease)    History of hiatal hernia    Hyperlipidemia    Hyperparathyroidism (HCC)    Hypertension    Dx age 31.    Stroke [facility])    Questionable stoke when born or polio not sure pt. has right side weakness toes right foot turns outward and toes curled up some     Past Surgical History:  Procedure Laterality Date   ANKLE SURGERY Left    Broken.  Has screws and metal plate.   CHOLECYSTECTOMY     PARATHYROIDECTOMY Right  05/29/2019   Procedure: RIGHT INFERIOR PARATHYROIDECTOMY;  Surgeon: Oralee Billow, MD;  Location: [facility];  Service: General;  Laterality: Right;   ROBOTIC ADRENALECTOMY Right 05/31/2018   Procedure: XI ROBOTIC RIGHT ADRENALECTOMY;  Surgeon: Shela Derby, MD;  Location: [facility];  Service: General;  Laterality: Right;   THYROID  LOBECTOMY Right 05/29/2019   Procedure: RIGHT THYROID  LOBECTOMY;  Surgeon: Oralee Billow, MD;  Location: [facility];  Service: General;  Laterality: Right;    Current Outpatient Medications on File Prior to Visit  Medication Sig Dispense Refill   acetaminophen  (TYLENOL ) 500 MG tablet Take by mouth.     alum & mag hydroxide-simeth (MAALOX PLUS) 400-400-40 MG/5ML suspension Take by mouth.     aspirin EC 81 MG tablet Take 81 mg by mouth daily. Swallow whole.     carvedilol (COREG) 6.25 MG tablet Take by mouth.     cetirizine (ZYRTEC) 10 MG tablet Take 10 mg by mouth daily.     Cholecalciferol 25 MCG (1000 UT) tablet Take 3,000 Units by mouth daily.      cyanocobalamin  (VITAMIN B12) 1000 MCG/ML injection Inject 1 mL (1,000 mcg total) into the muscle every 30 (thirty) days. 3 mL 0   diclofenac  Sodium (VOLTAREN ) 1 % GEL Apply 2 g topically 4 (  four) times daily. 350 g 3   dicyclomine (BENTYL) 20 MG tablet Take 20 mg by mouth every 6 (six) hours.     DULoxetine  (CYMBALTA ) 60 MG capsule TAKE 1 CAPSULE EVERY DAY 90 capsule 2   ezetimibe  (ZETIA ) 10 MG tablet Take 1 tablet (10 mg total) by mouth at bedtime. 90 tablet 3   famotidine  (PEPCID ) 40 MG tablet one half tablet (20 mg dose) every morning.     FIBER PO Take by mouth.     fluticasone  (FLONASE ) 50 MCG/ACT nasal spray USE 2 SPRAYS IN EACH NOSTRIL EVERY DAY 48 g 3   furosemide  (LASIX ) 20 MG tablet TAKE 1 TABLET EVERY DAY 90 tablet 3   loperamide  (IMODIUM  A-D) 2 MG tablet Take 1 tablet (2 mg total) by mouth 4 (four) times daily as needed for diarrhea or loose stools. 30 tablet 0   loratadine (CLARITIN) 10 MG tablet Take 10 mg by mouth  daily.     Omega-3 Fatty Acids (FISH OIL) 1000 MG CAPS Take 1,000 mg by mouth daily.      ondansetron  (ZOFRAN ) 4 MG tablet Take 1 tablet (4 mg total) by mouth every 8 (eight) hours as needed for nausea or vomiting. 20 tablet 0   pantoprazole  (PROTONIX ) 40 MG tablet TAKE 1 TABLET EVERY DAY 90 tablet 0   Probiotic Product (PROBIOTIC & ACIDOPHILUS EX ST PO) Take 1 capsule by mouth at bedtime.      sodium polystyrene (KAYEXALATE ) 15 GM/60ML suspension Take 120 mLs (30 g total) by mouth daily. 240 mL 0   telmisartan (MICARDIS) 20 MG tablet Take 20 mg by mouth daily.     traMADol  (ULTRAM ) 50 MG tablet Take 1 tablet (50 mg total) by mouth every 8 (eight) hours as needed (mild pain). 90 tablet 2   traZODone  (DESYREL ) 100 MG tablet Take 1 tablet (100 mg total) by mouth at bedtime. 90 tablet 1   No current facility-administered medications on file prior to visit.    Social History   Socioeconomic History   Marital status: Married    Spouse name: Not on file   Number of children: Not on file   Years of education: Not on file   Highest education level: Not on file  Occupational History   Not on file  Tobacco Use   Smoking status: Never   Smokeless tobacco: Never  Vaping Use   Vaping status: Never Used  Substance and Sexual Activity   Alcohol use: No   Drug use: No   Sexual activity: Not Currently  Other Topics Concern   Not on file  Social History Narrative   Not on file   Social Drivers of Health   Financial Resource Strain: Low Risk  (01/28/2023)   Overall Financial Resource Strain (CARDIA)    Difficulty of Paying Living Expenses: Not hard at all  Food Insecurity: No Food Insecurity (01/28/2023)   Hunger Vital Sign    Worried About Running Out of Food in the Last Year: Never true    Ran Out of Food in the Last Year: Never true  Transportation Needs: No Transportation Needs (01/28/2023)   PRAPARE - Administrator, Civil Service (Medical): No    Lack of Transportation  (Non-Medical): No  Physical Activity: Insufficiently Active (01/28/2023)   Exercise Vital Sign    Days of Exercise per Week: 3 days    Minutes of Exercise per Session: 30 min  Stress: No Stress Concern Present (01/28/2023)   [employer] of  Occupational Health - Occupational Stress Questionnaire    Feeling of Stress : Not at all  Social Connections: Socially Integrated (01/28/2023)   Social Connection and Isolation Panel [NHANES]    Frequency of Communication with Friends and Family: More than three times a week    Frequency of Social Gatherings with Friends and Family: Three times a week    Attends Religious Services: More than 4 times per year    Active Member of Clubs or Organizations: Yes    Attends Banker Meetings: More than 4 times per year    Marital Status: Married  Catering manager Violence: Not At Risk (01/28/2023)   Humiliation, Afraid, Rape, and Kick questionnaire    Fear of Current or Ex-Partner: No    Emotionally Abused: No    Physically Abused: No    Sexually Abused: No    Family History  Problem Relation Age of Onset   Heart disease Mother        CHF   Cancer Mother        Uterine   Osteoporosis Mother    COPD Sister    Asthma Sister    Migraines Sister    Breast cancer Neg Hx     There were no vitals taken for this visit.  There is no height or weight on file to calculate BMI.      Objective:   Physical Exam Vitals and nursing note reviewed. Exam conducted with a chaperone present.  Constitutional:      Appearance: She is well-developed.  HENT:     Head: Normocephalic and atraumatic.  Eyes:     Conjunctiva/sclera: Conjunctivae normal.     Pupils: Pupils are equal, round, and reactive to light.  Cardiovascular:     Rate and Rhythm: Normal rate and regular rhythm.  Pulmonary:     Effort: Pulmonary effort is normal.  Abdominal:     Palpations: Abdomen is soft.  Musculoskeletal:       Arms:     Cervical back: Normal range of  motion and neck supple.  Skin:    General: Skin is warm and dry.  Neurological:     Mental Status: She is alert and oriented to person, place, and time.     Cranial Nerves: No cranial nerve deficit.     Motor: No abnormal muscle tone.     Coordination: Coordination normal.     Deep Tendon Reflexes: Reflexes are normal and symmetric. Reflexes normal.  Psychiatric:        Behavior: Behavior normal.        Thought Content: Thought content normal.        Judgment: Judgment normal.   X-rays were done of the lumbar spine, reported separately.  There is loss of normal lumbar lordosis.  There is a scoliosis of the lumbar spine with apex to the left at L2.  Diffuse degenerative changes are present of lumbar spine.  No fracture is noted.  Bone quality is osteoporotic.        Assessment & Plan:   Encounter Diagnosis  Name Primary?   Lumbar pain with radiation down left leg Yes   I will get MRI of the lumbar spine.  Return in two weeks.  She will continue the Tramadol .  Call if any problem.  Precautions discussed.  Electronically Signed Pleasant Brilliant, MD 5/1/202510:31 AM

## 2023-05-12 ENCOUNTER — Telehealth: Payer: Self-pay | Admitting: Family Medicine

## 2023-05-12 NOTE — Telephone Encounter (Signed)
 Yes she would have to come in for an appointment to discuss other pain medicine options and we could do that, she could also transfer her pain management controlled to their office and I would be okay with that but that would then nullify the contract with us  and she would have to essentially form a contract with them.  Just let me know what she decides, if she does decide to come back here and have us  manage then we can discuss other options, if she decides to go with them then that is fine but she just know that we will nullify the contract here.Aaron Aas

## 2023-05-12 NOTE — Telephone Encounter (Signed)
 Left message making pt aware that she will need an appt to discuss.

## 2023-05-13 ENCOUNTER — Other Ambulatory Visit: Payer: Self-pay | Admitting: Family Medicine

## 2023-05-13 NOTE — Telephone Encounter (Signed)
Pt is scheduled 5/28

## 2023-05-16 DIAGNOSIS — M48061 Spinal stenosis, lumbar region without neurogenic claudication: Secondary | ICD-10-CM | POA: Diagnosis not present

## 2023-05-16 DIAGNOSIS — M545 Low back pain, unspecified: Secondary | ICD-10-CM | POA: Diagnosis not present

## 2023-05-16 DIAGNOSIS — M4726 Other spondylosis with radiculopathy, lumbar region: Secondary | ICD-10-CM | POA: Diagnosis not present

## 2023-05-16 DIAGNOSIS — M5115 Intervertebral disc disorders with radiculopathy, thoracolumbar region: Secondary | ICD-10-CM | POA: Diagnosis not present

## 2023-05-16 DIAGNOSIS — M79605 Pain in left leg: Secondary | ICD-10-CM | POA: Diagnosis not present

## 2023-05-19 ENCOUNTER — Encounter: Payer: Self-pay | Admitting: Orthopaedic Surgery

## 2023-05-19 ENCOUNTER — Ambulatory Visit: Admitting: Orthopaedic Surgery

## 2023-05-19 DIAGNOSIS — M48062 Spinal stenosis, lumbar region with neurogenic claudication: Secondary | ICD-10-CM

## 2023-05-19 DIAGNOSIS — M79605 Pain in left leg: Secondary | ICD-10-CM

## 2023-05-19 NOTE — Progress Notes (Signed)
 I hurt.  She had MRI of the lumbar spine showing 1.    Moderate spinal canal stenosis at L3-L4 and mild spinal canal stenosis at L2-L3, unchanged. Similar moderate narrowing of the left lateral recess at L5-S1.  2.    Increased marked right L1-L2 and persistent marked left L5-S1 neuroforaminal narrowing. Additional less pronounced neuroforaminal narrowing in the spine, as detailed in the findings.  3.    Multilevel mild Modic type I edematous endplate changes in the lumbar spine, overall progressed from 2019.   I have explained the findings to her.  I will have her see Dr. Daisey Dryer for possible epidural.  I have independently reviewed the MRI.    She has kidney problems and cannot take NSAIDs.  She has no new trauma but her pain is worse.  Lower back is painful ROM is good but painful.  No spasm.  Muscle tone and strength are normal.  NV intact.  SLR negative.  She has a slow shuffling type of gait.  Encounter Diagnosis  Name Primary?   Lumbar pain with radiation down left leg Yes   To Dr. Daisey Dryer for possible epidural.  I will see in two months.  Call if any problem.  Precautions discussed.  Electronically Signed Pleasant Brilliant, MD 5/15/202510:40 AM

## 2023-06-01 ENCOUNTER — Ambulatory Visit (INDEPENDENT_AMBULATORY_CARE_PROVIDER_SITE_OTHER): Payer: Medicare HMO | Admitting: Family Medicine

## 2023-06-01 ENCOUNTER — Encounter: Payer: Self-pay | Admitting: Family Medicine

## 2023-06-01 VITALS — BP 144/83 | HR 84 | Ht 63.0 in | Wt 149.0 lb

## 2023-06-01 DIAGNOSIS — I10 Essential (primary) hypertension: Secondary | ICD-10-CM

## 2023-06-01 DIAGNOSIS — E782 Mixed hyperlipidemia: Secondary | ICD-10-CM

## 2023-06-01 DIAGNOSIS — Z1231 Encounter for screening mammogram for malignant neoplasm of breast: Secondary | ICD-10-CM | POA: Diagnosis not present

## 2023-06-01 DIAGNOSIS — M5136 Other intervertebral disc degeneration, lumbar region with discogenic back pain only: Secondary | ICD-10-CM | POA: Diagnosis not present

## 2023-06-01 LAB — CMP14+EGFR
ALT: 13 IU/L (ref 0–32)
AST: 19 IU/L (ref 0–40)
Albumin: 4.4 g/dL (ref 3.8–4.8)
Alkaline Phosphatase: 122 IU/L — ABNORMAL HIGH (ref 44–121)
BUN/Creatinine Ratio: 18 (ref 12–28)
BUN: 24 mg/dL (ref 8–27)
Bilirubin Total: 0.3 mg/dL (ref 0.0–1.2)
CO2: 16 mmol/L — ABNORMAL LOW (ref 20–29)
Calcium: 9.3 mg/dL (ref 8.7–10.3)
Chloride: 102 mmol/L (ref 96–106)
Creatinine, Ser: 1.3 mg/dL — ABNORMAL HIGH (ref 0.57–1.00)
Globulin, Total: 2.2 g/dL (ref 1.5–4.5)
Glucose: 92 mg/dL (ref 70–99)
Potassium: 4.8 mmol/L (ref 3.5–5.2)
Sodium: 142 mmol/L (ref 134–144)
Total Protein: 6.6 g/dL (ref 6.0–8.5)
eGFR: 44 mL/min/{1.73_m2} — ABNORMAL LOW (ref >59)

## 2023-06-01 MED ORDER — TRAMADOL HCL 50 MG PO TABS: 50.0000 mg | ORAL_TABLET | Freq: Three times a day (TID) | ORAL | 2 refills | Status: DC | PRN

## 2023-06-01 NOTE — Progress Notes (Signed)
 BP (!) 144/83   Pulse 84   Ht 5\' 3"  (1.6 m)   Wt 149 lb (67.6 kg)   SpO2 98%   BMI 26.39 kg/m    Subjective:   Patient ID: Maria Moss, female    DOB: 1950/09/04, 72 y.o.   MRN: [id number]  HPI: Maria Moss is a 73 y.o. female presenting on 06/01/2023 for Medical Management of Chronic Issues, Hyperlipidemia, Hypertension, and Generalized Body Aches   HPI Hyperlipidemia Patient is coming in for recheck of his hyperlipidemia. The patient is currently taking fish oils, she says she could not take the Zetia . They deny any issues with myalgias or history of liver damage from it. They deny any focal numbness or weakness or chest pain.   Hypertension Patient is currently on carvedilol and furosemide  and telmisartan, and their blood pressure today is 132/79 on recheck and she had a blood pressure of 112/78 last night at home. Patient denies any lightheadedness or dizziness. Patient denies headaches, blurred vision, chest pains, shortness of breath, or weakness. Denies any side effects from medication and is content with current medication.   Pain assessment: Cause of pain-degenerative disc disease Pain location-lumbar Pain on scale of 1-10- 9 Frequency-Daily What increases pain-movement and walking What makes pain Better-rest Effects on ADL -limits movement some Any change in general medical condition-none  Current opioids rx-tramadol  50 mg 3 times daily as needed # meds rx-90 Effectiveness of current meds-helps some but not completely Adverse reactions from pain meds-none Morphine equivalent-15  Pill count performed-No Last drug screen -01/11/2023 ( high risk q22m, moderate risk q23m, low risk yearly ) Urine drug screen today- No Was the [facility] reviewed-yes  If yes were their any concerning findings? -None  Pain contract signed on: 01/11/2023  Relevant past medical, surgical, family and social history reviewed and updated as indicated. Interim medical history since our last visit  reviewed. Allergies and medications reviewed and updated.  Review of Systems  Constitutional:  Negative for chills and fever.  HENT:  Negative for congestion, ear discharge and ear pain.   Eyes:  Negative for redness and visual disturbance.  Respiratory:  Negative for chest tightness and shortness of breath.   Cardiovascular:  Negative for chest pain and leg swelling.  Genitourinary:  Negative for difficulty urinating and dysuria.  Musculoskeletal:  Negative for back pain and gait problem.  Skin:  Negative for rash.  Neurological:  Negative for dizziness, light-headedness and headaches.  Psychiatric/Behavioral:  Negative for agitation and behavioral problems.   All other systems reviewed and are negative.   Per HPI unless specifically indicated above   Allergies as of 06/01/2023       Reactions   Codeine Nausea And Vomiting   Glycopyrrolate    constipation   Sulfa Antibiotics    Hair loss, yellowed skin   Doxycycline Hyclate Itching        Medication List        Accurate as of Jun 01, 2023 10:19 AM. If you have any questions, ask your nurse or doctor.          acetaminophen  500 MG tablet Commonly known as: TYLENOL  Take by mouth.   alum & mag hydroxide-simeth 400-400-40 MG/5ML suspension Commonly known as: MAALOX PLUS Take by mouth.   aspirin EC 81 MG tablet Take 81 mg by mouth daily. Swallow whole.   carvedilol 6.25 MG tablet Commonly known as: COREG Take by mouth.   cetirizine 10 MG tablet Commonly known as: ZYRTEC Take  10 mg by mouth daily.   Cholecalciferol 25 MCG (1000 UT) tablet Take 3,000 Units by mouth daily.   cyanocobalamin  1000 MCG/ML injection Commonly known as: VITAMIN B12 Inject 1 mL (1,000 mcg total) into the muscle every 30 (thirty) days.   diclofenac  Sodium 1 % Gel Commonly known as: Voltaren  Apply 2 g topically 4 (four) times daily.   dicyclomine 20 MG tablet Commonly known as: BENTYL Take 20 mg by mouth every 6 (six) hours.    DULoxetine  60 MG capsule Commonly known as: CYMBALTA  TAKE 1 CAPSULE EVERY DAY   ezetimibe  10 MG tablet Commonly known as: ZETIA  Take 1 tablet (10 mg total) by mouth at bedtime.   famotidine  40 MG tablet Commonly known as: PEPCID  one half tablet (20 mg dose) every morning.   FIBER PO Take by mouth.   Fish Oil 1000 MG Caps Take 1,000 mg by mouth daily.   fluticasone  50 MCG/ACT nasal spray Commonly known as: FLONASE  USE 2 SPRAYS IN EACH NOSTRIL EVERY DAY   furosemide  20 MG tablet Commonly known as: LASIX  TAKE 1 TABLET EVERY DAY   loperamide  2 MG tablet Commonly known as: Imodium  A-D Take 1 tablet (2 mg total) by mouth 4 (four) times daily as needed for diarrhea or loose stools.   loratadine 10 MG tablet Commonly known as: CLARITIN Take 10 mg by mouth daily.   ondansetron  4 MG tablet Commonly known as: Zofran  Take 1 tablet (4 mg total) by mouth every 8 (eight) hours as needed for nausea or vomiting.   pantoprazole  40 MG tablet Commonly known as: PROTONIX  TAKE 1 TABLET EVERY DAY   PROBIOTIC & ACIDOPHILUS EX ST PO Take 1 capsule by mouth at bedtime.   sodium polystyrene 15 GM/60ML suspension Commonly known as: KAYEXALATE  Take 120 mLs (30 g total) by mouth daily.   telmisartan 20 MG tablet Commonly known as: MICARDIS Take 20 mg by mouth daily.   traMADol  50 MG tablet Commonly known as: ULTRAM  Take 1 tablet (50 mg total) by mouth every 8 (eight) hours as needed (mild pain).   traZODone  100 MG tablet Commonly known as: DESYREL  Take 1 tablet (100 mg total) by mouth at bedtime.         Objective:   BP (!) 144/83   Pulse 84   Ht 5\' 3"  (1.6 m)   Wt 149 lb (67.6 kg)   SpO2 98%   BMI 26.39 kg/m   Wt Readings from Last 3 Encounters:  06/01/23 149 lb (67.6 kg)  03/04/23 147 lb 3.2 oz (66.8 kg)  03/03/23 147 lb (66.7 kg)    Physical Exam Vitals and nursing note reviewed.  Constitutional:      General: She is not in acute distress.    Appearance: She  is well-developed. She is not diaphoretic.  Eyes:     Conjunctiva/sclera: Conjunctivae normal.  Cardiovascular:     Rate and Rhythm: Normal rate and regular rhythm.     Heart sounds: Normal heart sounds. No murmur heard. Pulmonary:     Effort: Pulmonary effort is normal. No respiratory distress.     Breath sounds: Normal breath sounds. No wheezing.  Musculoskeletal:        General: No swelling.  Skin:    General: Skin is warm and dry.     Findings: No rash.  Neurological:     Mental Status: She is alert and oriented to person, place, and time.     Coordination: Coordination normal.  Psychiatric:  Behavior: Behavior normal.       Assessment & Plan:   Problem List Items Addressed This Visit       Cardiovascular and Mediastinum   Hypertension (Chronic)   Relevant Orders   CMP14+EGFR     Musculoskeletal and Integument   Degenerative disc disease, lumbar (Chronic)   Relevant Medications   traMADol  (ULTRAM ) 50 MG tablet     Other   Hyperlipemia (Chronic)   Relevant Orders   CMP14+EGFR   Other Visit Diagnoses       Encounter for screening mammogram for malignant neoplasm of breast    -  Primary   Relevant Orders   MM 3D DIAGNOSTIC MAMMOGRAM BILATERAL BREAST       Patient also seen a back doctor for some back and fusions on June 4 and then see if that helps with the pain.  He has not may have to adjust the medicines in the future or she said she may go to them to start having them manage it but she will let us  know.  Schedule mammogram today. Follow up plan: Return in about 3 months (around 09/01/2023), or if symptoms worsen or fail to improve, for Hypertension and back pain.  Counseling provided for all of the vaccine components Orders Placed This Encounter  Procedures   MM 3D DIAGNOSTIC MAMMOGRAM BILATERAL BREAST   CMP14+EGFR    Jolyne Needs, MD [facility] 06/01/2023, 10:19 AM

## 2023-06-03 ENCOUNTER — Ambulatory Visit: Payer: Self-pay | Admitting: Family Medicine

## 2023-06-08 ENCOUNTER — Encounter: Admitting: Physical Medicine and Rehabilitation

## 2023-06-08 ENCOUNTER — Other Ambulatory Visit: Payer: Self-pay

## 2023-06-08 DIAGNOSIS — Z1231 Encounter for screening mammogram for malignant neoplasm of breast: Secondary | ICD-10-CM

## 2023-06-15 ENCOUNTER — Other Ambulatory Visit: Payer: Self-pay | Admitting: Family Medicine

## 2023-06-15 DIAGNOSIS — M5136 Other intervertebral disc degeneration, lumbar region with discogenic back pain only: Secondary | ICD-10-CM

## 2023-06-22 ENCOUNTER — Ambulatory Visit: Admitting: Surgical

## 2023-06-29 ENCOUNTER — Ambulatory Visit
Admission: RE | Admit: 2023-06-29 | Discharge: 2023-06-29 | Disposition: A | Discharge status: Home / Self Care | Source: Ambulatory Visit | Attending: Family Medicine | Admitting: Family Medicine

## 2023-06-29 DIAGNOSIS — Z1231 Encounter for screening mammogram for malignant neoplasm of breast: Secondary | ICD-10-CM | POA: Diagnosis not present

## 2023-06-30 ENCOUNTER — Other Ambulatory Visit: Payer: Self-pay

## 2023-06-30 ENCOUNTER — Ambulatory Visit: Admitting: Physical Medicine and Rehabilitation

## 2023-06-30 VITALS — BP 155/83 | HR 74

## 2023-06-30 DIAGNOSIS — M5416 Radiculopathy, lumbar region: Secondary | ICD-10-CM | POA: Diagnosis not present

## 2023-06-30 DIAGNOSIS — M48062 Spinal stenosis, lumbar region with neurogenic claudication: Secondary | ICD-10-CM

## 2023-06-30 DIAGNOSIS — M419 Scoliosis, unspecified: Secondary | ICD-10-CM

## 2023-06-30 MED ORDER — METHYLPREDNISOLONE ACETATE 40 MG/ML IJ SUSP
40.0000 mg | Freq: Once | INTRAMUSCULAR | Status: AC
  Administered 2023-06-30: 40 mg

## 2023-06-30 NOTE — Procedures (Signed)
 Lumbar Epidural Steroid Injection - Interlaminar Approach with Fluoroscopic Guidance  Patient: Maria Moss      Date of Birth: [birth year] MRN: [id number] PCP: Dettinger, Fonda LABOR, MD      Visit Date: 06/30/2023   Universal Protocol:     Consent Given By: the patient  Position: PRONE  Additional Comments: Vital signs were monitored before and after the procedure. Patient was prepped and draped in the usual sterile fashion. The correct patient, procedure, and site was verified.   Injection Procedure Details:   Procedure diagnoses: Lumbar radiculopathy [M54.16]   Meds Administered:  Meds ordered this encounter  Medications   methylPREDNISolone  acetate (DEPO-MEDROL ) injection 40 mg     Laterality: Left  Location/Site:  L3-4  Needle: 3.5 in., 20 ga. Tuohy  Needle Placement: Paramedian epidural  Findings:   -Comments: Excellent flow of contrast into the epidural space.  Procedure Details: Using a paramedian approach from the side mentioned above, the region overlying the inferior lamina was localized under fluoroscopic visualization and the soft tissues overlying this structure were infiltrated with 4 ml. of 1% Lidocaine  without Epinephrine . The Tuohy needle was inserted into the epidural space using a paramedian approach.   The epidural space was localized using loss of resistance along with counter oblique bi-planar fluoroscopic views.  After negative aspirate for air, blood, and CSF, a 2 ml. volume of Isovue-250 was injected into the epidural space and the flow of contrast was observed. Radiographs were obtained for documentation purposes.    The injectate was administered into the level noted above.   Additional Comments:  The patient tolerated the procedure well Dressing: 2 x 2 sterile gauze and Band-Aid    Post-procedure details: Patient was observed during the procedure. Post-procedure instructions were reviewed.  Patient left the clinic in stable condition.

## 2023-06-30 NOTE — Progress Notes (Signed)
 Maria Moss - 73 y.o. female MRN [id number]  Date of birth: 05/11/1950  Office Visit Note: Visit Date: 06/30/2023 PCP: Dettinger, Fonda LABOR, MD Referred by: Dettinger, Fonda LABOR, MD  Subjective: Chief Complaint  Patient presents with   Lower Back - Pain   HPI:  Maria Moss is a 73 y.o. female who comes in today at the request of Dr. Lemond Stable for planned Left L3-4 Lumbar Interlaminar epidural steroid injection with fluoroscopic guidance.  The patient has failed conservative care including home exercise, medications, time and activity modification.  This injection will be diagnostic and hopefully therapeutic.  Please see requesting physician notes for further details and justification.   ROS Otherwise per HPI.  Assessment & Plan: Visit Diagnoses:    ICD-10-CM   1. Lumbar radiculopathy  M54.16 XR C-ARM NO REPORT    Epidural Steroid injection    methylPREDNISolone  acetate (DEPO-MEDROL ) injection 40 mg    2. 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 Scoliosis of lumbar spine, unspecified scoliosis type  M41.9       Plan: No additional findings.   Meds & Orders:  Meds ordered this encounter  Medications   methylPREDNISolone  acetate (DEPO-MEDROL ) injection 40 mg    Orders Placed This Encounter  Procedures   XR C-ARM NO REPORT   Epidural Steroid injection    Follow-up: Return for visit to requesting provider as needed.   Procedures: No procedures performed  Lumbar Epidural Steroid Injection - Interlaminar Approach with Fluoroscopic Guidance  Patient: Maria Moss      Date of Birth: February 02, 1950 MRN: [id number] PCP: Dettinger, Fonda LABOR, MD      Visit Date: 06/30/2023   Universal Protocol:     Consent Given By: the patient  Position: PRONE  Additional Comments: Vital signs were monitored before and after the procedure. Patient was prepped and draped in the usual sterile fashion. The correct patient, procedure, and site was  verified.   Injection Procedure Details:   Procedure diagnoses: Lumbar radiculopathy [M54.16]   Meds Administered:  Meds ordered this encounter  Medications   methylPREDNISolone  acetate (DEPO-MEDROL ) injection 40 mg     Laterality: Left  Location/Site:  L3-4  Needle: 3.5 in., 20 ga. Tuohy  Needle Placement: Paramedian epidural  Findings:   -Comments: Excellent flow of contrast into the epidural space.  Procedure Details: Using a paramedian approach from the side mentioned above, the region overlying the inferior lamina was localized under fluoroscopic visualization and the soft tissues overlying this structure were infiltrated with 4 ml. of 1% Lidocaine  without Epinephrine . The Tuohy needle was inserted into the epidural space using a paramedian approach.   The epidural space was localized using loss of resistance along with counter oblique bi-planar fluoroscopic views.  After negative aspirate for air, blood, and CSF, a 2 ml. volume of Isovue-250 was injected into the epidural space and the flow of contrast was observed. Radiographs were obtained for documentation purposes.    The injectate was administered into the level noted above.   Additional Comments:  The patient tolerated the procedure well Dressing: 2 x 2 sterile gauze and Band-Aid    Post-procedure details: Patient was observed during the procedure. Post-procedure instructions were reviewed.  Patient left the clinic in stable condition.   Clinical History: Exam:  Lumbar Spine MRI without Contrast  History:  Back pain with left lower extremity radiculopathy  Technique:  Complete MRI of the lumbar spine without contrast.  Comparison:  Lumbar spine radiograph 10/28/2017.  Findings:   There are 5 nonrib-bearing lumbar vertebrae.  Moderate levoscoliotic curvature of the lumbar spine centered at L2. Retrolisthesis at T12-L1 by 2 mm and grade 1 anterolisthesis of L3-L4 by 4 mm in the setting of facet  arthropathy. No compression fracture in the lumbar spine. Mild chronic left-sided L5 vertebral height loss due to degenerative change. Mild Modic type I edematous endplate changes at T12-L1, L1-L2, L2-L3, and L5-S1, overall progressed from 2019. Additional trace degenerative endplate edema in the remainder of the lumbar spine. Vacuum disc phenomenon from T12-S1. Mild periarticular facet soft tissue edema posterior to the right L3-L4 facets, consistent with facet arthropathy. L1 vertebral body hemangioma.  Normal appearance and position of the terminal spinal cord.    T12-L1: Disc bulge eccentric to the right. Mild facet arthropathy. Mild narrowing of the right subarticular recess without central spinal canal stenosis, new from prior. Similar mild right neuroforaminal narrowing.  L1-2: Diffuse disc bulge eccentric to the right. Mild right facet arthropathy. No spinal canal stenosis. Increased marked right and unchanged mild left neuroforaminal narrowing.  L2-3: Diffuse disc bulge. Moderate facet arthropathy. Mild narrowing of the subarticular recesses with similar mild spinal canal stenosis. Unchanged moderate right and mild left neuroforaminal narrowing.  L3-4: Diffuse disc bulge. Marked facet arthropathy and right greater than left ligamentum flavum thickening. Similar moderate spinal canal stenosis and marked narrowing of the right greater than left lateral recesses. Increased mild-moderate right and mild left neuroforaminal narrowing.  L4-5: Moderate disc height loss. Diffuse disc bulge eccentric to the left. Marked left and moderate facet arthropathy with left greater than right ligamentum flavum thickening. Mild narrowing of the left lateral recess without central spinal canal stenosis. Unchanged moderate left neuroforaminal narrowing.  L5-S1: Diffuse disc bulge eccentric to the left. Marked left and moderate-marked right facet arthropathy. Left greater than right ligamentum flavum thickening.  Similar moderate narrowing of the left lateral recess without central spinal canal stenosis. Persistent marked left neuroforaminal narrowing.  Visualized prevertebral soft tissues are normal.  IMPRESSION: 1.    Moderate spinal canal stenosis at L3-L4 and mild spinal canal stenosis at L2-L3, unchanged. Similar moderate narrowing of the left lateral recess at L5-S1. 2.    Increased marked right L1-L2 and persistent marked left L5-S1 neuroforaminal narrowing. Additional less pronounced neuroforaminal narrowing in the spine, as detailed in the findings. 3.    Multilevel mild Modic type I edematous endplate changes in the lumbar spine, overall progressed from 2019.  Signed (Electronic Signature): 05/17/2023 9:19 AM Signed By: Corean Henry, MD Exam End: 05/16/23 11:47  Specimen Collected: 05/17/23 09:07 Last Resulted: 05/17/23 09:19 Received From: [facility]  Result Received: 05/18/23 15:49     Objective:  VS:  HT:    WT:   BMI:     BP:(!) 155/83  HR:74bpm  TEMP: ( )  RESP:  Physical Exam Vitals and nursing note reviewed.  Constitutional:      General: She is not in acute distress.    Appearance: Normal appearance. She is not ill-appearing.  HENT:     Head: Normocephalic and atraumatic.     Right Ear: External ear normal.     Left Ear: External ear normal.   Eyes:     Extraocular Movements: Extraocular movements intact.    Cardiovascular:     Rate and Rhythm: Normal rate.     Pulses: Normal pulses.  Pulmonary:     Effort: Pulmonary effort is normal. No respiratory distress.  Abdominal:     General: There is no distension.     Palpations: Abdomen is soft.   Musculoskeletal:        General: Tenderness present.     Cervical back: Neck supple.     Right lower leg: No edema.     Left lower leg: No edema.     Comments: Patient has good distal strength with no pain over the greater trochanters.  No clonus or focal weakness.   Skin:    Findings: No erythema, lesion or  rash.   Neurological:     General: No focal deficit present.     Mental Status: She is alert and oriented to person, place, and time.     Sensory: No sensory deficit.     Motor: No weakness or abnormal muscle tone.     Coordination: Coordination normal.   Psychiatric:        Mood and Affect: Mood normal.        Behavior: Behavior normal.      Imaging: XR C-ARM NO REPORT Result Date: 06/30/2023 Please see Notes tab for imaging impression.

## 2023-06-30 NOTE — Patient Instructions (Signed)

## 2023-06-30 NOTE — Progress Notes (Signed)
 Pain Scale   Average Pain 8 Patient advising she has chronic lower back pain radiating bilaterally to hips. Patient states walking increases her pain, sitting eases her pain at times.        +Driver, -BT, -Dye Allergies.

## 2023-07-04 ENCOUNTER — Encounter: Payer: Self-pay | Admitting: Family Medicine

## 2023-07-04 ENCOUNTER — Other Ambulatory Visit: Payer: Self-pay | Admitting: Family Medicine

## 2023-07-04 DIAGNOSIS — R928 Other abnormal and inconclusive findings on diagnostic imaging of breast: Secondary | ICD-10-CM

## 2023-07-13 ENCOUNTER — Ambulatory Visit
Admission: RE | Admit: 2023-07-13 | Discharge: 2023-07-13 | Disposition: A | Discharge status: Home / Self Care | Source: Ambulatory Visit | Attending: Family Medicine

## 2023-07-13 ENCOUNTER — Ambulatory Visit

## 2023-07-13 DIAGNOSIS — R928 Other abnormal and inconclusive findings on diagnostic imaging of breast: Secondary | ICD-10-CM

## 2023-07-14 ENCOUNTER — Other Ambulatory Visit: Payer: Self-pay | Admitting: Family Medicine

## 2023-07-14 ENCOUNTER — Other Ambulatory Visit: Payer: Self-pay | Admitting: Internal Medicine

## 2023-07-14 DIAGNOSIS — G8929 Other chronic pain: Secondary | ICD-10-CM

## 2023-07-14 DIAGNOSIS — F411 Generalized anxiety disorder: Secondary | ICD-10-CM

## 2023-07-14 DIAGNOSIS — K219 Gastro-esophageal reflux disease without esophagitis: Secondary | ICD-10-CM

## 2023-07-21 ENCOUNTER — Ambulatory Visit: Admitting: Orthopaedic Surgery

## 2023-07-21 ENCOUNTER — Encounter: Payer: Self-pay | Admitting: Orthopaedic Surgery

## 2023-07-21 DIAGNOSIS — M545 Low back pain, unspecified: Secondary | ICD-10-CM

## 2023-07-21 DIAGNOSIS — M79605 Pain in left leg: Secondary | ICD-10-CM

## 2023-07-21 NOTE — Progress Notes (Signed)
 I hurt again.  She had epidural done 06/30/23 which helped for several weeks but her pain has returned now.  She has no new trauma.  She uses a cane.  She is not active.  She takes Tramadol  for pain which helps.  Her family doctor gives her this.  Lower back is tender, decreased ROM, scoliosis present, NV intact, slow gait, muscle tone and strength normal.  Encounter Diagnosis  Name Primary?   Lumbar pain with radiation down left leg Yes   I have told her about exercises to do at home and while shopping at [employer].  I have told her about having her feet flat on floor when sitting.  Continue her pain medicine.  Call Dr. Eldonna about another epidural injection.  Return in two months.  Call if any problem.  Precautions discussed.  Electronically Signed Lemond Stable, MD 7/17/202510:01 AM

## 2023-07-29 ENCOUNTER — Telehealth: Payer: Self-pay | Admitting: Radiology

## 2023-07-29 NOTE — Telephone Encounter (Signed)
 Patient called, said she is hurting again, and wants to see about another injection.  She also said last injection helped x 1 week.  No new injuries/falls.  Please call her to advise.   (Dr Brenna patient)

## 2023-08-16 ENCOUNTER — Encounter: Payer: Self-pay | Admitting: Physical Medicine and Rehabilitation

## 2023-08-16 ENCOUNTER — Ambulatory Visit: Admitting: Physical Medicine and Rehabilitation

## 2023-08-16 DIAGNOSIS — M47817 Spondylosis without myelopathy or radiculopathy, lumbosacral region: Secondary | ICD-10-CM | POA: Diagnosis not present

## 2023-08-16 DIAGNOSIS — G8929 Other chronic pain: Secondary | ICD-10-CM | POA: Diagnosis not present

## 2023-08-16 DIAGNOSIS — M545 Low back pain, unspecified: Secondary | ICD-10-CM | POA: Diagnosis not present

## 2023-08-16 DIAGNOSIS — M47816 Spondylosis without myelopathy or radiculopathy, lumbar region: Secondary | ICD-10-CM | POA: Diagnosis not present

## 2023-08-16 NOTE — Progress Notes (Signed)
 Maria Moss - 73 y.o. female MRN [id number]  Date of birth: 05/05/1950  Office Visit Note: Visit Date: 08/16/2023 PCP: Dettinger, Fonda LABOR, MD Referred by: Dettinger, Fonda LABOR, MD  Subjective: Chief Complaint  Patient presents with   Lower Back - Pain   HPI: Maria Moss is a 73 y.o. female who comes in today for evaluation of chronic, worsening and severe bilateral lower back pain, intermittent radiation to buttocks/hips and down left lateral thigh. She is previous patient of Dr. Oneil Herald, she is currently treated by Dr. Lemond Stable. The lower back pain seems to be most severe, pain to buttocks and left leg does resolve after walking. Pain ongoing for several years. Her lower back pain becomes severe with standing and performing household tasks such as cleaning and sweeping. She describes her pain as sore and aching, currently rates as 9 out of 10. Some relief of pain with home exercise regimen, rest and use of medications. She does take Tramadol  that is prescribed by her PCP Dr. Fonda Dettinger. Lumbar MRI imaging from May of this year [facility]) shows moderate levoscoliotic curvature, marked facet arthropathy and moderate spinal canal stenosis at L3-L4. There is multi level foraminal and lateral recess stenosis. Marked facet arthropathy at L5-S1. She underwent left L3-L4 interlaminar epidural steroid injection in our office on 06/30/2023, less than 50% relief of pain for about 1 week. States she does not feel injection was beneficial in alleviating her pain.   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 She reports gait difficulty, typically uses a cane.      Review of Systems  Musculoskeletal:  Positive for back pain.  Neurological:  Negative for tingling, sensory change, focal weakness and weakness.  All other systems reviewed and are negative.  Otherwise per HPI.  Assessment & Plan: Visit Diagnoses:     ICD-10-CM   1. Chronic bilateral low back pain without sciatica  M54.50 Ambulatory referral to Physical Medicine Rehab   G89.29     2. Spondylosis without myelopathy or radiculopathy, lumbosacral region  M47.817 Ambulatory referral to Physical Medicine Rehab    3. Facet arthropathy, lumbar  M47.816 Ambulatory referral to Physical Medicine Rehab       Plan: Findings:  Chronic, worsening and severe bilateral lower back pain, intermittent radiation to buttocks/hips and down left lateral thigh. Lower back discomfort is biggest pain generator for her. She continues to have severe pain despite good conservative therapies such as home exercise regimen, rest and use of medications.  Patient's clinical presentation and exam are consistent with facet mediated pain.  I do think intermittent pain radiating to buttocks/hips and down left lateral thigh is more of a facet joint syndrome.  We discussed treatment plan in detail today.  Neck step is to perform diagnostic bilateral L4-L5 and L5-S1 medial branch blocks under fluoroscopic guidance.  If good relief of pain with medial branch blocks we discussed possibility of longer sustained pain relief with radiofrequency ablation. I discussed injection procedure with her in detail today, she has no further questions. There is weakness to right lower extremity upon exam today. No new red flag symptoms noted upon exam today.     Meds & Orders: No orders of the defined types were placed in this encounter.   Orders Placed This Encounter  Procedures   Ambulatory referral to Physical Medicine Rehab    Follow-up: Return for Bilateral L4-L5 and L5-S1  medial branch blocks.   Procedures: No procedures performed      Clinical History: Exam:  Lumbar Spine MRI without Contrast  History:  Back pain with left lower extremity radiculopathy  Technique:  Complete MRI of the lumbar spine without contrast.  Comparison:  Lumbar spine radiograph 10/28/2017.  Findings:    There are 5 nonrib-bearing lumbar vertebrae.  Moderate levoscoliotic curvature of the lumbar spine centered at L2. Retrolisthesis at T12-L1 by 2 mm and grade 1 anterolisthesis of L3-L4 by 4 mm in the setting of facet arthropathy. No compression fracture in the lumbar spine. Mild chronic left-sided L5 vertebral height loss due to degenerative change. Mild Modic type I edematous endplate changes at T12-L1, L1-L2, L2-L3, and L5-S1, overall progressed from 2019. Additional trace degenerative endplate edema in the remainder of the lumbar spine. Vacuum disc phenomenon from T12-S1. Mild periarticular facet soft tissue edema posterior to the right L3-L4 facets, consistent with facet arthropathy. L1 vertebral body hemangioma.  Normal appearance and position of the terminal spinal cord.    T12-L1: Disc bulge eccentric to the right. Mild facet arthropathy. Mild narrowing of the right subarticular recess without central spinal canal stenosis, new from prior. Similar mild right neuroforaminal narrowing.  L1-2: Diffuse disc bulge eccentric to the right. Mild right facet arthropathy. No spinal canal stenosis. Increased marked right and unchanged mild left neuroforaminal narrowing.  L2-3: Diffuse disc bulge. Moderate facet arthropathy. Mild narrowing of the subarticular recesses with similar mild spinal canal stenosis. Unchanged moderate right and mild left neuroforaminal narrowing.  L3-4: Diffuse disc bulge. Marked facet arthropathy and right greater than left ligamentum flavum thickening. Similar moderate spinal canal stenosis and marked narrowing of the right greater than left lateral recesses. Increased mild-moderate right and mild left neuroforaminal narrowing.  L4-5: Moderate disc height loss. Diffuse disc bulge eccentric to the left. Marked left and moderate facet arthropathy with left greater than right ligamentum flavum thickening. Mild narrowing of the left lateral recess without central spinal canal  stenosis. Unchanged moderate left neuroforaminal narrowing.  L5-S1: Diffuse disc bulge eccentric to the left. Marked left and moderate-marked right facet arthropathy. Left greater than right ligamentum flavum thickening. Similar moderate narrowing of the left lateral recess without central spinal canal stenosis. Persistent marked left neuroforaminal narrowing.  Visualized prevertebral soft tissues are normal.  IMPRESSION: 1.    Moderate spinal canal stenosis at L3-L4 and mild spinal canal stenosis at L2-L3, unchanged. Similar moderate narrowing of the left lateral recess at L5-S1. 2.    Increased marked right L1-L2 and persistent marked left L5-S1 neuroforaminal narrowing. Additional less pronounced neuroforaminal narrowing in the spine, as detailed in the findings. 3.    Multilevel mild Modic type I edematous endplate changes in the lumbar spine, overall progressed from 2019.  Signed (Electronic Signature): 05/17/2023 9:19 AM Signed By: Corean Henry, MD Exam End: 05/16/23 11:47  Specimen Collected: 05/17/23 09:07 Last Resulted: 05/17/23 09:19 Received From: [facility]  Result Received: 05/18/23 15:49   She reports that she has never smoked. She has never used smokeless tobacco. No results for input(s): HGBA1C, LABURIC in the last 8760 hours.  Objective:  VS:  HT:    WT:   BMI:     BP:   HR: bpm  TEMP: ( )  RESP:  Physical Exam Vitals and nursing note reviewed.  HENT:     Head: Normocephalic and atraumatic.     Right Ear: External ear normal.     Left Ear: External ear normal.  Nose: Nose normal.     Mouth/Throat:     Mouth: Mucous membranes are moist.  Eyes:     Extraocular Movements: Extraocular movements intact.  Cardiovascular:     Rate and Rhythm: Normal rate.     Pulses: Normal pulses.  Pulmonary:     Effort: Pulmonary effort is normal.  Abdominal:     General: Abdomen is flat. There is no distension.  Musculoskeletal:        General: Tenderness present.      Cervical back: Normal range of motion.     Comments: Patient is slow to rise from seated position to standing. Good lumbar range of motion. No pain noted with facet loading. 5/5 strength noted with left hip flexion, knee flexion/extension, ankle dorsiflexion/plantarflexion and EHL. 5/5 strength noted with right hip flexion, knee flexion/extension and plantarflexion, 3/5 with ankle dorsiflexion and EHL No clonus noted bilaterally. No pain upon palpation of greater trochanters. No pain with internal/external rotation of bilateral hips. Sensation intact bilaterally. Negative slump test bilaterally. Ambulates without aid, gait slow.     Skin:    General: Skin is warm and dry.     Capillary Refill: Capillary refill takes less than 2 seconds.  Neurological:     Mental Status: She is alert and oriented to person, place, and time.     Motor: Weakness present.     Gait: Gait abnormal.  Psychiatric:        Mood and Affect: Mood normal.        Behavior: Behavior normal.     Ortho Exam  Imaging: No results found.  Past Medical/Family/Surgical/Social History: Medications & Allergies reviewed per EMR, new medications updated. Patient Active Problem List   Diagnosis Date Noted   H/O papillary adenocarcinoma of thyroid  01/23/2021   Hyperparathyroidism, primary (HCC) 05/29/2019   Right thyroid  nodule 05/29/2019   Anemia of chronic disease 10/13/2018   Vitamin D  deficiency 07/21/2018   H/O total adrenalectomy (HCC) 05/31/2018   Hypercalcemia 01/31/2018   Mass of right adrenal gland (HCC) 11/10/2017   Pain management contract agreement 11/28/2015   Encounter for pain management counseling 04/11/2015   BMI 27.0-27.9,adult 11/29/2014   Osteopenia 10/24/2013   Chronic constipation 02/12/2013   Degenerative disc disease, lumbar 07/21/2012   Hyperlipemia 07/21/2012   GAD (generalized anxiety disorder) 07/21/2012   Hypertension 07/21/2012   Past Medical History:  Diagnosis Date   Anemia     iron deficiency   Ankle fracture, left 2006   Diverticula, intestine    Mild case.   Fibromyalgia    Dr. Ethel Dx in  years ago  no meds    GERD (gastroesophageal reflux disease)    History of hiatal hernia    Hyperlipidemia    Hyperparathyroidism (HCC)    Hypertension    Dx age 73.    Stroke [facility])    Questionable stoke when born or polio not sure pt. has right side weakness toes right foot turns outward and toes curled up some    Family History  Problem Relation Age of Onset   Heart disease Mother        CHF   Cancer Mother        Uterine   Osteoporosis Mother    COPD Sister    Asthma Sister    Migraines Sister    Breast cancer Neg Hx    Past Surgical History:  Procedure Laterality Date   ANKLE SURGERY Left    Broken.  Has screws and metal plate.  CHOLECYSTECTOMY     PARATHYROIDECTOMY Right 05/29/2019   Procedure: RIGHT INFERIOR PARATHYROIDECTOMY;  Surgeon: Eletha Boas, MD;  Location: [facility];  Service: General;  Laterality: Right;   ROBOTIC ADRENALECTOMY Right 05/31/2018   Procedure: XI ROBOTIC RIGHT ADRENALECTOMY;  Surgeon: Rubin Calamity, MD;  Location: [facility];  Service: General;  Laterality: Right;   THYROID  LOBECTOMY Right 05/29/2019   Procedure: RIGHT THYROID  LOBECTOMY;  Surgeon: Eletha Boas, MD;  Location: [facility];  Service: General;  Laterality: Right;   Social History   Occupational History   Not on file  Tobacco Use   Smoking status: Never   Smokeless tobacco: Never  Vaping Use   Vaping status: Never Used  Substance and Sexual Activity   Alcohol use: No   Drug use: No   Sexual activity: Not Currently

## 2023-08-16 NOTE — Progress Notes (Signed)
 Pain Scale   Average Pain 7 Patient advising she has chronic lower back pain radiating to bilateral hips and left leg        +Driver, -BT, -Dye Allergies.

## 2023-08-18 ENCOUNTER — Other Ambulatory Visit: Payer: Self-pay | Admitting: *Deleted

## 2023-08-24 ENCOUNTER — Other Ambulatory Visit: Payer: Self-pay | Admitting: Family Medicine

## 2023-08-24 NOTE — Telephone Encounter (Signed)
 Copied from CRM #[id number]. Topic: Clinical - Medication Refill >> Aug 24, 2023 12:39 PM Carla L wrote: Medication: amlodipine  5mg  Patient had extra medication and has not needed refill in a while. Patient reached out to pharmacy and informed denied by provider office. Requesting refills.   Has the patient contacted their pharmacy? Yes  This is the patient's preferred pharmacy:  [facility] - [place], [place] - [address] [place] [address] Phone: [phone] Fax: [phone]  Is this the correct pharmacy for this prescription? Yes  Has the prescription been filled recently? No  Is the patient out of the medication? No  Has the patient been seen for an appointment in the last year OR does the patient have an upcoming appointment? Yes  Can we respond through MyChart? Yes  Agent: Please be advised that Rx refills may take up to 3 business days. We ask that you follow-up with your pharmacy.

## 2023-08-24 NOTE — Telephone Encounter (Signed)
 Medication: amlodipine  5mg  Patient had extra medication and has not needed refill in a while. Patient reached out to pharmacy and informed denied by provider office. Requesting refills.   Appt 09/02/23

## 2023-08-25 MED ORDER — AMLODIPINE BESYLATE 5 MG PO TABS: 5.0000 mg | ORAL_TABLET | Freq: Every day | ORAL | 0 refills | Status: DC

## 2023-08-25 NOTE — Telephone Encounter (Signed)
 Patient is going to run out before scheduled appointment on August 29th. Patient verbalized understanding that she has an appointment with Dr. Maryanne on 08/29

## 2023-08-26 ENCOUNTER — Encounter: Payer: Self-pay | Admitting: Radiology

## 2023-08-27 ENCOUNTER — Other Ambulatory Visit: Payer: Self-pay | Admitting: Family Medicine

## 2023-09-01 DIAGNOSIS — R195 Other fecal abnormalities: Secondary | ICD-10-CM | POA: Diagnosis not present

## 2023-09-01 DIAGNOSIS — R1030 Lower abdominal pain, unspecified: Secondary | ICD-10-CM | POA: Diagnosis not present

## 2023-09-01 DIAGNOSIS — Z8719 Personal history of other diseases of the digestive system: Secondary | ICD-10-CM | POA: Diagnosis not present

## 2023-09-01 DIAGNOSIS — K219 Gastro-esophageal reflux disease without esophagitis: Secondary | ICD-10-CM | POA: Diagnosis not present

## 2023-09-01 DIAGNOSIS — Z9049 Acquired absence of other specified parts of digestive tract: Secondary | ICD-10-CM | POA: Diagnosis not present

## 2023-09-01 DIAGNOSIS — K589 Irritable bowel syndrome without diarrhea: Secondary | ICD-10-CM | POA: Diagnosis not present

## 2023-09-01 DIAGNOSIS — Z9889 Other specified postprocedural states: Secondary | ICD-10-CM | POA: Diagnosis not present

## 2023-09-02 ENCOUNTER — Other Ambulatory Visit: Payer: Self-pay | Admitting: Oncology

## 2023-09-02 ENCOUNTER — Telehealth: Payer: Self-pay | Admitting: Physical Medicine and Rehabilitation

## 2023-09-02 ENCOUNTER — Encounter: Payer: Self-pay | Admitting: Family Medicine

## 2023-09-02 ENCOUNTER — Ambulatory Visit: Admitting: Family Medicine

## 2023-09-02 VITALS — BP 132/78 | HR 63 | Temp 97.1°F | Ht 63.0 in | Wt 147.0 lb

## 2023-09-02 DIAGNOSIS — E538 Deficiency of other specified B group vitamins: Secondary | ICD-10-CM

## 2023-09-02 DIAGNOSIS — D638 Anemia in other chronic diseases classified elsewhere: Secondary | ICD-10-CM

## 2023-09-02 DIAGNOSIS — D509 Iron deficiency anemia, unspecified: Secondary | ICD-10-CM

## 2023-09-02 DIAGNOSIS — G8929 Other chronic pain: Secondary | ICD-10-CM

## 2023-09-02 DIAGNOSIS — D75839 Thrombocytosis, unspecified: Secondary | ICD-10-CM | POA: Diagnosis not present

## 2023-09-02 DIAGNOSIS — K219 Gastro-esophageal reflux disease without esophagitis: Secondary | ICD-10-CM

## 2023-09-02 DIAGNOSIS — F411 Generalized anxiety disorder: Secondary | ICD-10-CM | POA: Diagnosis not present

## 2023-09-02 DIAGNOSIS — I10 Essential (primary) hypertension: Secondary | ICD-10-CM

## 2023-09-02 DIAGNOSIS — E782 Mixed hyperlipidemia: Secondary | ICD-10-CM | POA: Diagnosis not present

## 2023-09-02 DIAGNOSIS — M5136 Other intervertebral disc degeneration, lumbar region with discogenic back pain only: Secondary | ICD-10-CM

## 2023-09-02 LAB — LIPID PANEL

## 2023-09-02 MED ORDER — TRAMADOL HCL 50 MG PO TABS: 50.0000 mg | ORAL_TABLET | Freq: Three times a day (TID) | ORAL | 2 refills | Status: DC | PRN

## 2023-09-02 MED ORDER — EZETIMIBE 10 MG PO TABS: 10.0000 mg | ORAL_TABLET | Freq: Every evening | ORAL | 3 refills | Status: DC

## 2023-09-02 MED ORDER — DULOXETINE HCL 60 MG PO CPEP: 60.0000 mg | ORAL_CAPSULE | Freq: Every day | ORAL | 3 refills | Status: AC

## 2023-09-02 MED ORDER — PANTOPRAZOLE SODIUM 40 MG PO TBEC
40.0000 mg | DELAYED_RELEASE_TABLET | Freq: Every day | ORAL | 3 refills | Status: AC
Start: 2023-09-02 — End: ?

## 2023-09-02 MED ORDER — AMLODIPINE BESYLATE 5 MG PO TABS
5.0000 mg | ORAL_TABLET | Freq: Every day | ORAL | 3 refills | Status: AC
Start: 2023-09-02 — End: ?

## 2023-09-02 NOTE — Progress Notes (Signed)
 BP 132/78   Pulse 63   Temp (!) 97.1 F (36.2 C) (Temporal)   Ht 5' 3 (1.6 m)   Wt 147 lb (66.7 kg)   SpO2 96%   BMI 26.04 kg/m    Subjective:   Patient ID: Maria Moss, female    DOB: 01-Jan-1951, 73 y.o.   MRN: [id number]  HPI: Maria Moss is a 73 y.o. female presenting on 09/02/2023 for Medical Management of Chronic Issues   Discussed the use of AI scribe software for clinical note transcription with the patient, who gave verbal consent to proceed.  History of Present Illness   Maria Moss is a 73 year old female with degenerative disc disease who presents for a recheck.  She manages her degenerative disc disease with tramadol  50 mg three times a day as needed, finding relief especially when combined with Tylenol  at night. Occasionally, she takes Tylenol  in the afternoon if her pain worsens. A previous injection for her back pain did not provide significant relief. She has signed a pain agreement contract and underwent a urine drug screen on January 11, 2023.  She is also managing hypertension with carvedilol, amlodipine , telmisartan, and furosemide . She takes carvedilol and telmisartan twice a day, and amlodipine  5 mg once a day, although she has run out of amlodipine . She is not taking Zetia  for cholesterol due to leg pain but continues to take fish oil, three capsules in the morning.  She experiences increased pain in her hips and legs. She reports that swelling in her right leg typically resolves by evening.        Relevant past medical, surgical, family and social history reviewed and updated as indicated. Interim medical history since our last visit reviewed. Allergies and medications reviewed and updated.  Review of Systems  Constitutional:  Negative for chills and fever.  Eyes:  Negative for visual disturbance.  Respiratory:  Negative for chest tightness and shortness of breath.   Cardiovascular:  Negative for chest pain and leg swelling.  Musculoskeletal:  Positive for  arthralgias, back pain, gait problem and myalgias.  Skin:  Negative for rash.  Neurological:  Negative for dizziness, light-headedness and headaches.  Psychiatric/Behavioral:  Negative for agitation and behavioral problems.   All other systems reviewed and are negative.   Per HPI unless specifically indicated above   Allergies as of 09/02/2023       Reactions   Codeine Nausea And Vomiting   Glycopyrrolate    constipation   Sulfa Antibiotics    Hair loss, yellowed skin   Doxycycline Hyclate Itching        Medication List        Accurate as of September 02, 2023 10:40 AM. If you have any questions, ask your nurse or doctor.          STOP taking these medications    cyanocobalamin  1000 MCG/ML injection Commonly known as: VITAMIN B12 Stopped by: Fonda LABOR Journei Thomassen   diclofenac  Sodium 1 % Gel Commonly known as: Voltaren  Stopped by: Fonda LABOR Strummer Canipe   loratadine 10 MG tablet Commonly known as: CLARITIN Stopped by: [place] Zaragoza A Derrica Sieg   sodium polystyrene 15 GM/60ML suspension Commonly known as: KAYEXALATE  Stopped by: Fonda LABOR Kinnie Kaupp       TAKE these medications    acetaminophen  500 MG tablet Commonly known as: TYLENOL  Take by mouth.   alum & mag hydroxide-simeth 400-400-40 MG/5ML suspension Commonly known as: MAALOX PLUS Take by mouth.   amLODipine  5 MG tablet  Commonly known as: NORVASC  Take 1 tablet (5 mg total) by mouth daily.   aspirin EC 81 MG tablet Take 81 mg by mouth daily. Swallow whole.   carvedilol 6.25 MG tablet Commonly known as: COREG Take by mouth.   cetirizine 10 MG tablet Commonly known as: ZYRTEC Take 10 mg by mouth daily.   Cholecalciferol 25 MCG (1000 UT) tablet Take 3,000 Units by mouth daily.   dicyclomine 20 MG tablet Commonly known as: BENTYL Take 20 mg by mouth every 6 (six) hours.   DULoxetine  60 MG capsule Commonly known as: CYMBALTA  Take 1 capsule (60 mg total) by mouth daily.   ezetimibe  10 MG  tablet Commonly known as: ZETIA  Take 1 tablet (10 mg total) by mouth at bedtime.   famotidine  40 MG tablet Commonly known as: PEPCID  one half tablet (20 mg dose) every morning.   FIBER PO Take by mouth.   Fish Oil 1000 MG Caps Take 1,000 mg by mouth daily.   fluticasone  50 MCG/ACT nasal spray Commonly known as: FLONASE  USE 2 SPRAYS IN EACH NOSTRIL EVERY DAY   furosemide  20 MG tablet Commonly known as: LASIX  TAKE 1 TABLET EVERY DAY   loperamide  2 MG tablet Commonly known as: Imodium  A-D Take 1 tablet (2 mg total) by mouth 4 (four) times daily as needed for diarrhea or loose stools.   ondansetron  4 MG tablet Commonly known as: Zofran  Take 1 tablet (4 mg total) by mouth every 8 (eight) hours as needed for nausea or vomiting.   pantoprazole  40 MG tablet Commonly known as: PROTONIX  Take 1 tablet (40 mg total) by mouth daily.   PROBIOTIC & ACIDOPHILUS EX ST PO Take 1 capsule by mouth at bedtime.   telmisartan 20 MG tablet Commonly known as: MICARDIS Take 20 mg by mouth daily.   traMADol  50 MG tablet Commonly known as: ULTRAM  Take 1 tablet (50 mg total) by mouth every 8 (eight) hours as needed (mild pain).   traZODone  100 MG tablet Commonly known as: DESYREL  Take 1 tablet (100 mg total) by mouth at bedtime.         Objective:   BP 132/78   Pulse 63   Temp (!) 97.1 F (36.2 C) (Temporal)   Ht 5' 3 (1.6 m)   Wt 147 lb (66.7 kg)   SpO2 96%   BMI 26.04 kg/m   Wt Readings from Last 3 Encounters:  09/02/23 147 lb (66.7 kg)  06/01/23 149 lb (67.6 kg)  03/04/23 147 lb 3.2 oz (66.8 kg)    Physical Exam Physical Exam   VITALS: BP- 132/78 NECK: Thyroid  normal, no masses. CHEST: Lungs clear to auscultation bilaterally. CARDIOVASCULAR: Heart regular rate and rhythm, normal heart sounds. EXTREMITIES: No significant edema in legs. Peripheral pulses intact in legs.         Assessment & Plan:   Problem List Items Addressed This Visit       Cardiovascular  and Mediastinum   Hypertension (Chronic)   Relevant Medications   amLODipine  (NORVASC ) 5 MG tablet   ezetimibe  (ZETIA ) 10 MG tablet   Other Relevant Orders   CMP14+EGFR   Lipid panel     Musculoskeletal and Integument   Degenerative disc disease, lumbar (Chronic)   Relevant Medications   traMADol  (ULTRAM ) 50 MG tablet     Other   Hyperlipemia - Primary (Chronic)   Relevant Medications   amLODipine  (NORVASC ) 5 MG tablet   ezetimibe  (ZETIA ) 10 MG tablet   Other Relevant Orders  Lipid panel   GAD (generalized anxiety disorder) (Chronic)   Relevant Medications   DULoxetine  (CYMBALTA ) 60 MG capsule   Anemia of chronic disease   Other Visit Diagnoses       Chronic midline low back pain without sciatica       Relevant Medications   traMADol  (ULTRAM ) 50 MG tablet   DULoxetine  (CYMBALTA ) 60 MG capsule     Gastroesophageal reflux disease without esophagitis       Relevant Medications   pantoprazole  (PROTONIX ) 40 MG tablet   Other Relevant Orders   CBC with Differential/Platelet     Thrombocytosis         Iron deficiency anemia, unspecified iron deficiency anemia type         B12 deficiency              Degenerative disc disease, lumbar region with chronic low back pain Chronic low back pain managed with tramadol  and Tylenol . Previous injection therapy ineffective. Nerve block planned for long-term relief. - Continue tramadol  50 mg TID PRN. - Use Tylenol  as adjunctive therapy. - Proceed with planned nerve block procedure.  Essential hypertension Blood pressure controlled at 132/78 mmHg with current regimen. Telmisartan taken twice daily instead of once. - Continue carvedilol, amlodipine , telmisartan, and furosemide . - Refill amlodipine . - Educate on correct telmisartan dosing.  Edema, right lower extremity Mild right lower extremity edema with evening improvement.  Mixed hyperlipidemia Not taking Zetia  due to leg pain. Managing cholesterol with fish oil. -  Discontinue Zetia . - Continue fish oil supplements.  History of thyroid  cancer No palpable thyroid  abnormalities. Current examination normal.  Follow-Up Upcoming blood work scheduled. Discussed completing today if possible. - Coordinate with lab for blood work today if possible. - Follow up with cancer center as scheduled.          Follow up plan: Return in about 3 months (around 12/03/2023), or if symptoms worsen or fail to improve, for Degenerative disc disease and hypertension and hyperlipidemia.  Counseling provided for all of the vaccine components Orders Placed This Encounter  Procedures   CBC with Differential/Platelet   CMP14+EGFR   Lipid panel    Fonda Levins, MD [facility] 09/02/2023, 10:40 AM

## 2023-09-02 NOTE — Telephone Encounter (Signed)
 Patient called. She would like to something called in for her to take for her nerves before her injection.

## 2023-09-03 LAB — CBC WITH DIFFERENTIAL/PLATELET
Basophils Absolute: 0 x10E3/uL (ref 0.0–0.2)
Basos: 0 %
EOS (ABSOLUTE): 0.4 x10E3/uL (ref 0.0–0.4)
Eos: 6 %
Hematocrit: 35.6 % (ref 34.0–46.6)
Hemoglobin: 11.8 g/dL (ref 11.1–15.9)
Immature Grans (Abs): 0 x10E3/uL (ref 0.0–0.1)
Immature Granulocytes: 0 %
Lymphocytes Absolute: 1.7 x10E3/uL (ref 0.7–3.1)
Lymphs: 26 %
MCH: 30.3 pg (ref 26.6–33.0)
MCHC: 33.1 g/dL (ref 31.5–35.7)
MCV: 91 fL (ref 79–97)
Monocytes Absolute: 0.8 x10E3/uL (ref 0.1–0.9)
Monocytes: 11 %
Neutrophils Absolute: 3.9 x10E3/uL (ref 1.4–7.0)
Neutrophils: 57 %
Platelets: 515 x10E3/uL — ABNORMAL HIGH (ref 150–450)
RBC: 3.9 x10E6/uL (ref 3.77–5.28)
RDW: 13.2 % (ref 11.7–15.4)
WBC: 6.8 x10E3/uL (ref 3.4–10.8)

## 2023-09-03 LAB — CMP14+EGFR
ALT: 18 IU/L (ref 0–32)
AST: 20 IU/L (ref 0–40)
Albumin: 4.5 g/dL (ref 3.8–4.8)
Alkaline Phosphatase: 136 IU/L — ABNORMAL HIGH (ref 44–121)
BUN/Creatinine Ratio: 19 (ref 12–28)
BUN: 27 mg/dL (ref 8–27)
Bilirubin Total: 0.5 mg/dL (ref 0.0–1.2)
CO2: 23 mmol/L (ref 20–29)
Calcium: 9.8 mg/dL (ref 8.7–10.3)
Chloride: 102 mmol/L (ref 96–106)
Creatinine, Ser: 1.41 mg/dL — ABNORMAL HIGH (ref 0.57–1.00)
Globulin, Total: 2 g/dL (ref 1.5–4.5)
Glucose: 96 mg/dL (ref 70–99)
Potassium: 4.8 mmol/L (ref 3.5–5.2)
Sodium: 141 mmol/L (ref 134–144)
Total Protein: 6.5 g/dL (ref 6.0–8.5)
eGFR: 40 mL/min/1.73 — ABNORMAL LOW (ref >59)

## 2023-09-03 LAB — LIPID PANEL
Chol/HDL Ratio: 3.6 ratio (ref 0.0–4.4)
Cholesterol, Total: 237 mg/dL — ABNORMAL HIGH (ref 100–199)
HDL: 66 mg/dL (ref >39)
LDL Chol Calc (NIH): 139 mg/dL — ABNORMAL HIGH (ref 0–99)
Triglycerides: 182 mg/dL — ABNORMAL HIGH (ref 0–149)
VLDL Cholesterol Cal: 32 mg/dL (ref 5–40)

## 2023-09-06 ENCOUNTER — Other Ambulatory Visit: Payer: Self-pay | Admitting: Physical Medicine and Rehabilitation

## 2023-09-06 MED ORDER — DIAZEPAM 5 MG PO TABS
ORAL_TABLET | ORAL | 0 refills | Status: DC
Start: 2023-09-06 — End: 2023-11-01

## 2023-09-07 LAB — IRON AND TIBC
Iron Saturation: 22 % (ref 15–55)
Iron: 63 ug/dL (ref 27–139)
Total Iron Binding Capacity: 282 ug/dL (ref 250–450)
UIBC: 219 ug/dL (ref 118–369)

## 2023-09-07 LAB — LACTATE DEHYDROGENASE: LDH: 182 IU/L (ref 119–226)

## 2023-09-07 LAB — METHYLMALONIC ACID, SERUM: Methylmalonic Acid: 352 nmol/L (ref 0–378)

## 2023-09-07 LAB — VITAMIN B12: Vitamin B-12: 676 pg/mL (ref 232–1245)

## 2023-09-07 LAB — FERRITIN: Ferritin: 233 ng/mL — ABNORMAL HIGH (ref 15–150)

## 2023-09-13 ENCOUNTER — Other Ambulatory Visit: Payer: Self-pay

## 2023-09-13 ENCOUNTER — Ambulatory Visit: Admitting: Physical Medicine and Rehabilitation

## 2023-09-13 VITALS — BP 145/83 | HR 72

## 2023-09-13 DIAGNOSIS — M47817 Spondylosis without myelopathy or radiculopathy, lumbosacral region: Secondary | ICD-10-CM | POA: Diagnosis not present

## 2023-09-13 MED ORDER — BUPIVACAINE HCL 0.5 % IJ SOLN
3.0000 mL | Freq: Once | INTRAMUSCULAR | Status: AC
  Administered 2023-09-13: 3 mL

## 2023-09-13 NOTE — Progress Notes (Signed)
 Maria Moss - 73 y.o. female MRN [id number]  Date of birth: 12/21/1950  Office Visit Note: Visit Date: 09/13/2023 PCP: Dettinger, Fonda LABOR, MD Referred by: Dettinger, Fonda LABOR, MD  Subjective: Chief Complaint  Patient presents with   Lower Back - Pain   HPI:  Maria Moss is a 73 y.o. female who comes in today at the request of Duwaine Pouch, FNP for planned Bilateral  L4-5 and L5-S1 Lumbar facet/medial branch block with fluoroscopic guidance.  The patient has failed conservative care including home exercise, medications, time and activity modification.  This injection will be diagnostic and hopefully therapeutic.  Please see requesting physician notes for further details and justification.  Exam has shown concordant pain with facet joint loading.   ROS Otherwise per HPI.  Assessment & Plan: Visit Diagnoses:    ICD-10-CM   1. Spondylosis without myelopathy or radiculopathy, lumbosacral region  M47.817 XR C-ARM NO REPORT    Facet Injection    bupivacaine  (MARCAINE ) 0.5 % (with pres) injection 3 mL      Plan: No additional findings.   Meds & Orders:  Meds ordered this encounter  Medications   bupivacaine  (MARCAINE ) 0.5 % (with pres) injection 3 mL    Orders Placed This Encounter  Procedures   Facet Injection   XR C-ARM NO REPORT    Follow-up: Return for Review Pain Diary.   Procedures: No procedures performed  Lumbar Diagnostic Facet Joint Nerve Block with Fluoroscopic Guidance   Patient: Maria Moss      Date of Birth: 1950-08-12 MRN: [id number] PCP: Dettinger, Fonda LABOR, MD      Visit Date: 09/13/2023   Universal Protocol:    Date/Time: 09/12/2509:07 AM  Consent Given By: the patient  Position: PRONE  Additional Comments: Vital signs were monitored before and after the procedure. Patient was prepped and draped in the usual sterile fashion. The correct patient, procedure, and site was verified.   Injection Procedure Details:   Procedure diagnoses:  1.  Spondylosis without myelopathy or radiculopathy, lumbosacral region      Meds Administered:  Meds ordered this encounter  Medications   bupivacaine  (MARCAINE ) 0.5 % (with pres) injection 3 mL     Laterality: Bilateral  Location/Site: L4-L5, L3 and L4 medial branches and L5-S1, L4 medial branch and L5 dorsal ramus  Needle: 5.0 in., 25 ga.  Short bevel or Quincke spinal needle  Needle Placement: Oblique pedical  Findings:   -Comments: There was excellent flow of contrast along the articular pillars without intravascular flow.  Procedure Details: The fluoroscope beam is vertically oriented in AP and then obliqued 15 to 20 degrees to the ipsilateral side of the desired nerve to achieve the "Scotty dog" appearance.  The skin over the target area of the junction of the superior articulating process and the transverse process (sacral ala if blocking the L5 dorsal rami) was locally anesthetized with a 1 ml volume of 1% Lidocaine  without Epinephrine .  The spinal needle was inserted and advanced in a trajectory view down to the target.   After contact with periosteum and negative aspirate for blood and CSF, correct placement without intravascular or epidural spread was confirmed by injecting 0.5 ml. of Isovue-250.  A spot radiograph was obtained of this image.    Next, a 0.5 ml. volume of the injectate described above was injected. The needle was then redirected to the other facet joint nerves mentioned above if needed.  Prior to the procedure, the patient was given  a Pain Diary which was completed for baseline measurements.  After the procedure, the patient rated their pain every 30 minutes and will continue rating at this frequency for a total of 5 hours.  The patient has been asked to complete the Diary and return to us  by mail, fax or hand delivered as soon as possible.   Additional Comments:  The patient tolerated the procedure well Dressing: 2 x 2 sterile gauze and Band-Aid     Post-procedure details: Patient was observed during the procedure. Post-procedure instructions were reviewed.  Patient left the clinic in stable condition.   Clinical History: Exam:  Lumbar Spine MRI without Contrast  History:  Back pain with left lower extremity radiculopathy  Technique:  Complete MRI of the lumbar spine without contrast.  Comparison:  Lumbar spine radiograph 10/28/2017.  Findings:   There are 5 nonrib-bearing lumbar vertebrae.  Moderate levoscoliotic curvature of the lumbar spine centered at L2. Retrolisthesis at T12-L1 by 2 mm and grade 1 anterolisthesis of L3-L4 by 4 mm in the setting of facet arthropathy. No compression fracture in the lumbar spine. Mild chronic left-sided L5 vertebral height loss due to degenerative change. Mild Modic type I edematous endplate changes at T12-L1, L1-L2, L2-L3, and L5-S1, overall progressed from 2019. Additional trace degenerative endplate edema in the remainder of the lumbar spine. Vacuum disc phenomenon from T12-S1. Mild periarticular facet soft tissue edema posterior to the right L3-L4 facets, consistent with facet arthropathy. L1 vertebral body hemangioma.  Normal appearance and position of the terminal spinal cord.    T12-L1: Disc bulge eccentric to the right. Mild facet arthropathy. Mild narrowing of the right subarticular recess without central spinal canal stenosis, new from prior. Similar mild right neuroforaminal narrowing.  L1-2: Diffuse disc bulge eccentric to the right. Mild right facet arthropathy. No spinal canal stenosis. Increased marked right and unchanged mild left neuroforaminal narrowing.  L2-3: Diffuse disc bulge. Moderate facet arthropathy. Mild narrowing of the subarticular recesses with similar mild spinal canal stenosis. Unchanged moderate right and mild left neuroforaminal narrowing.  L3-4: Diffuse disc bulge. Marked facet arthropathy and right greater than left ligamentum flavum thickening. Similar  moderate spinal canal stenosis and marked narrowing of the right greater than left lateral recesses. Increased mild-moderate right and mild left neuroforaminal narrowing.  L4-5: Moderate disc height loss. Diffuse disc bulge eccentric to the left. Marked left and moderate facet arthropathy with left greater than right ligamentum flavum thickening. Mild narrowing of the left lateral recess without central spinal canal stenosis. Unchanged moderate left neuroforaminal narrowing.  L5-S1: Diffuse disc bulge eccentric to the left. Marked left and moderate-marked right facet arthropathy. Left greater than right ligamentum flavum thickening. Similar moderate narrowing of the left lateral recess without central spinal canal stenosis. Persistent marked left neuroforaminal narrowing.  Visualized prevertebral soft tissues are normal.  IMPRESSION: 1.    Moderate spinal canal stenosis at L3-L4 and mild spinal canal stenosis at L2-L3, unchanged. Similar moderate narrowing of the left lateral recess at L5-S1. 2.    Increased marked right L1-L2 and persistent marked left L5-S1 neuroforaminal narrowing. Additional less pronounced neuroforaminal narrowing in the spine, as detailed in the findings. 3.    Multilevel mild Modic type I edematous endplate changes in the lumbar spine, overall progressed from 2019.  Signed (Electronic Signature): 05/17/2023 9:19 AM Signed By: Corean Henry, MD Exam End: 05/16/23 11:47  Specimen Collected: 05/17/23 09:07 Last Resulted: 05/17/23 09:19 Received From: [facility]  Result Received: 05/18/23 15:49  Objective:  VS:  HT:    WT:   BMI:     BP:(!) 145/83  HR:72bpm  TEMP: ( )  RESP:  Physical Exam Vitals and nursing note reviewed.  Constitutional:      General: She is not in acute distress.    Appearance: Normal appearance. She is not ill-appearing.  HENT:     Head: Normocephalic and atraumatic.     Right Ear: External ear normal.     Left Ear: External ear  normal.  Eyes:     Extraocular Movements: Extraocular movements intact.  Cardiovascular:     Rate and Rhythm: Normal rate.     Pulses: Normal pulses.  Pulmonary:     Effort: Pulmonary effort is normal. No respiratory distress.  Abdominal:     General: There is no distension.     Palpations: Abdomen is soft.  Musculoskeletal:        General: Tenderness present.     Cervical back: Neck supple.     Right lower leg: No edema.     Left lower leg: No edema.     Comments: Patient has good distal strength with no pain over the greater trochanters.  No clonus or focal weakness.  Skin:    Findings: No erythema, lesion or rash.  Neurological:     General: No focal deficit present.     Mental Status: She is alert and oriented to person, place, and time.     Sensory: No sensory deficit.     Motor: No weakness or abnormal muscle tone.     Coordination: Coordination normal.  Psychiatric:        Mood and Affect: Mood normal.        Behavior: Behavior normal.      Imaging: No results found.

## 2023-09-13 NOTE — Progress Notes (Signed)
 Pain Scale   Average Pain 7 Patient advising she has chronic lower back pain radiating bilaterally to legs and hips. Pain is constant per patient.        +Driver, -BT, -Dye Allergies.

## 2023-09-13 NOTE — Procedures (Signed)
 Lumbar Diagnostic Facet Joint Nerve Block with Fluoroscopic Guidance   Patient: Maria Moss      Date of Birth: February 10, 1950 MRN: [id number] PCP: Dettinger, Fonda LABOR, MD      Visit Date: 09/13/2023   Universal Protocol:    Date/Time: 09/12/2509:07 AM  Consent Given By: the patient  Position: PRONE  Additional Comments: Vital signs were monitored before and after the procedure. Patient was prepped and draped in the usual sterile fashion. The correct patient, procedure, and site was verified.   Injection Procedure Details:   Procedure diagnoses:  1. Spondylosis without myelopathy or radiculopathy, lumbosacral region      Meds Administered:  Meds ordered this encounter  Medications   bupivacaine  (MARCAINE ) 0.5 % (with pres) injection 3 mL     Laterality: Bilateral  Location/Site: L4-L5, L3 and L4 medial branches and L5-S1, L4 medial branch and L5 dorsal ramus  Needle: 5.0 in., 25 ga.  Short bevel or Quincke spinal needle  Needle Placement: Oblique pedical  Findings:   -Comments: There was excellent flow of contrast along the articular pillars without intravascular flow.  Procedure Details: The fluoroscope beam is vertically oriented in AP and then obliqued 15 to 20 degrees to the ipsilateral side of the desired nerve to achieve the "Scotty dog" appearance.  The skin over the target area of the junction of the superior articulating process and the transverse process (sacral ala if blocking the L5 dorsal rami) was locally anesthetized with a 1 ml volume of 1% Lidocaine  without Epinephrine .  The spinal needle was inserted and advanced in a trajectory view down to the target.   After contact with periosteum and negative aspirate for blood and CSF, correct placement without intravascular or epidural spread was confirmed by injecting 0.5 ml. of Isovue-250.  A spot radiograph was obtained of this image.    Next, a 0.5 ml. volume of the injectate described above was injected. The  needle was then redirected to the other facet joint nerves mentioned above if needed.  Prior to the procedure, the patient was given a Pain Diary which was completed for baseline measurements.  After the procedure, the patient rated their pain every 30 minutes and will continue rating at this frequency for a total of 5 hours.  The patient has been asked to complete the Diary and return to us  by mail, fax or hand delivered as soon as possible.   Additional Comments:  The patient tolerated the procedure well Dressing: 2 x 2 sterile gauze and Band-Aid    Post-procedure details: Patient was observed during the procedure. Post-procedure instructions were reviewed.  Patient left the clinic in stable condition.

## 2023-09-14 ENCOUNTER — Ambulatory Visit: Payer: Self-pay | Admitting: Family Medicine

## 2023-09-14 MED ORDER — NEXLETOL 180 MG PO TABS: 180.0000 mg | ORAL_TABLET | Freq: Every day | ORAL | 5 refills | Status: AC

## 2023-09-20 ENCOUNTER — Telehealth: Payer: Self-pay

## 2023-09-20 ENCOUNTER — Other Ambulatory Visit (HOSPITAL_COMMUNITY): Payer: Self-pay

## 2023-09-20 NOTE — Telephone Encounter (Signed)
 Pharmacy Patient Advocate Encounter   Received notification from Onbase that prior authorization for Nexletol  180 mg tablets is required/requested.   Insurance verification completed.   The patient is insured through [employer] .   Per test claim:  Atorvastatin, Rosuvastatin or Repatha is preferred by the insurance.  If suggested medication is appropriate, Please send in a new RX and discontinue this one. If not, please advise as to why it's not appropriate so that we may request a Prior Authorization. Please note, some preferred medications may still require a PA.  If the suggested medications have not been trialed and there are no contraindications to their use, the PA will not be submitted, as it will not be approved.

## 2023-09-21 ENCOUNTER — Other Ambulatory Visit (HOSPITAL_COMMUNITY): Payer: Self-pay

## 2023-09-21 NOTE — Telephone Encounter (Signed)
 lmtcb

## 2023-09-21 NOTE — Telephone Encounter (Signed)
 PA submitted via Latent. Key: Maria Moss

## 2023-09-21 NOTE — Telephone Encounter (Signed)
 Pharmacy Patient Advocate Encounter  Received notification from [employer] that Prior Authorization for Nexletol  180MG  tablets  has been APPROVED from 01/05/23 to 01/04/24. Ran test claim, Copay is $272.61. This test claim was processed through [facility]- copay amounts may vary at other pharmacies due to pharmacy/plan contracts, or as the patient moves through the different stages of their insurance plan.   PA #/Case ID/Reference #: [id number]

## 2023-09-21 NOTE — Telephone Encounter (Signed)
 Patient has had myalgias with statin in the past, she had tried simvastatin  and other statins in the past.  She did not tolerate statins.  Patient has decreased dexterity so an injection is harder for her which is why we are going with the Nexletol  instead of the Repatha

## 2023-09-22 ENCOUNTER — Inpatient Hospital Stay: Attending: Oncology | Admitting: Oncology

## 2023-09-22 ENCOUNTER — Ambulatory Visit: Admitting: Orthopaedic Surgery

## 2023-09-22 ENCOUNTER — Telehealth: Payer: Self-pay | Admitting: Family Medicine

## 2023-09-22 VITALS — BP 124/65 | HR 65 | Temp 97.7°F | Resp 20 | Wt 149.9 lb

## 2023-09-22 DIAGNOSIS — D508 Other iron deficiency anemias: Secondary | ICD-10-CM | POA: Diagnosis not present

## 2023-09-22 DIAGNOSIS — D75839 Thrombocytosis, unspecified: Secondary | ICD-10-CM | POA: Diagnosis not present

## 2023-09-22 DIAGNOSIS — E538 Deficiency of other specified B group vitamins: Secondary | ICD-10-CM | POA: Insufficient documentation

## 2023-09-22 DIAGNOSIS — D509 Iron deficiency anemia, unspecified: Secondary | ICD-10-CM | POA: Insufficient documentation

## 2023-09-22 MED ORDER — CYANOCOBALAMIN 1000 MCG/ML IJ SOLN: 1000.0000 ug | INTRAMUSCULAR | 0 refills | Status: AC

## 2023-09-22 NOTE — Assessment & Plan Note (Deleted)
 This is a combination of CKD and iron deficiency state. - EGD (09/20/2019): Gastritis, gastric polyp, hiatal hernia - She had hiatal hernia repair November 2021 - Last Feraheme  infusion was on 10/26/2019 and 11/02/2019 -  She reports occasional dark and sticky stools, but denies any black bowel movements.  No bright red blood per rectum. - She had a positive Cologuard test on 03/11/2021. 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  I am not able to see pathology results. - She complains of significant fatigue, which is chronic -Labs from 02/14/23 show hemoglobin of 11.7, hematocrit 34.5.  Differential is unremarkable.  Ferritin is elevated at 502.  Iron saturations 18%, TIBC 310.  B12 772 with MMA 421. -No additional IV iron needed at this time.  Recommend follow-up in approximately 6 months.

## 2023-09-22 NOTE — Telephone Encounter (Signed)
 Spoke with pt. Confirmed that she was not taking any cholesterol medication at her last blood draw. She was only taking fish oil. Pt has been prescribed Zetia  and Nexletol . Since pt was not on the Zetia  at the time of blood work I advised pt to take that one instead of Nexletol  ( Pt admitted to already be taking the Zetia ). Pt understood to take only the Zetia . She has not picked up Nexletol  from the pharmacy. Will make Dettinger aware and call pt if he has a different medication.

## 2023-09-22 NOTE — Telephone Encounter (Signed)
 Patient return call. ?

## 2023-09-22 NOTE — Telephone Encounter (Signed)
 Pt is gong to take Zetia  instead. She was not on the medication at her last blood draw.

## 2023-09-22 NOTE — Telephone Encounter (Signed)
 Copied from CRM [id number]. Topic: Clinical - Medication Question >> Sep 22, 2023  9:53 AM Maria Moss wrote: Reason for CRM: patient returning call from Maria Moss, Maria Moss, Maria Moss  Patient has to leave today at 12:00 for another appt

## 2023-09-22 NOTE — Progress Notes (Signed)
 [facility] OFFICE PROGRESS NOTE  Dettinger, Fonda LABOR, MD  ASSESSMENT & PLAN:    Assessment & Plan Thrombocytosis -Will continue active surveillance. -Platelets are more or less stable. - No indication for cytoreductive therapy at this time.  Can continue active surveillance, but would consider Hydrea if patient had platelet count over 1 million.  - Continue aspirin 81 mg daily to decrease risk of VTE in setting of thrombocytosis. - Repeat labs (PCP) RTC in 6 months. Other iron deficiency anemia -This is a combination of CKD and iron deficiency state. - EGD (09/20/2019): Gastritis, gastric polyp, hiatal hernia - She had hiatal hernia repair November 2021 - Last Feraheme  infusion was on 10/26/2019 and 11/02/2019 -  She reports occasional dark and sticky stools, but denies any black bowel movements.  No bright red blood per rectum. - She had a positive Cologuard test on 03/11/2021. 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  I am not able to see pathology results. - She complains of significant fatigue, which is chronic -Labs from 09/02/2023 show hemoglobin of 11.8, ferritin 233, iron saturation 22% with a normal TIBC. -In the setting of CKD, would recommend iron saturations closer to 30 to 35%. -We discussed 1 dose of IV Feraheme  given she tolerated these well in the past. -Recheck labs in 6 months.  She is unable to tolerate oral iron.  B12 deficiency -Vitamin B12 level is around 600. -She received 3 months worth of B12 injections.  Reports they helped quite a bit with her energy levels. -MMA has normalized. -Recommend monthly B12 shots at home by her daughter.  New Rx sent. -Recheck labs in 6 months.  Orders Placed This Encounter  Procedures   Iron and TIBC    Standing Status:   Future    Expected Date:    03/21/2024    Expiration Date:   06/19/2024   CBC with Differential/Platelet    Standing Status:   Future    Expected Date:   03/21/2024    Expiration Date:   06/19/2024    Release to patient:   Immediate   Comprehensive metabolic panel    Standing Status:   Future    Expected Date:   03/21/2024    Expiration Date:   06/19/2024    Release to patient:   Immediate   Lactate dehydrogenase    Standing Status:   Future    Expected Date:   03/21/2024    Expiration Date:   06/19/2024    Release to patient:   Immediate   Methylmalonic acid, serum    Standing Status:   Future    Expected Date:   03/21/2024    Expiration Date:   06/19/2024    Release to patient:   Immediate   Vitamin B12    Standing Status:   Future    Expected Date:   03/21/2024    Expiration Date:   06/19/2024    Release to patient:   Immediate   Ferritin    Standing Status:   Future    Expected Date:   03/21/2024    Expiration Date:   06/19/2024    Release to patient:   Immediate    INTERVAL HISTORY: Patient returns for follow-up for thrombocytosis, iron deficiency anemia and B12 deficiency.   In the interim, she denies any hospitalizations, surgeries or changes to her baseline  health.  She is followed closely by her PCP Dr. Maryanne.   Patient recently had a joint injection for radiculopathy of the lumbar sacral region with Dr. Eldonna.  She continues to take tramadol  for DDD as well.  She met with digestive health specialist with [facility] on 09/01/2023 for IBS.  She was instructed to take MiraLAX daily and to continue H2 and PPI for acid reflux.  She met with Dr. 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  She was taken off lisinopril  due to hyperkalemia and started on telmisartan.   Today, she reports doing well.  Reports improvement of her back pain after the recent injection.  Reports chronic but stable fatigue.  Appetite is 65%.  She has intermittent shortness of breath.  She has  rotating constipation and diarrhea.  Reports severe abdominal pain with constipation so she has been trying to take MiraLAX every other day which is helping some.  Unable to tolerate oral iron.   We reviewed CBC, iron panel, ferritin, B12, LDH, MMA.  SUMMARY OF HEMATOLOGIC HISTORY: Oncology History   No history exists.   Appears she has had elevated platelet counts for years. -Previous hematology workup was negative for JAK2 with reflex testing.  BCR able FISH was negative. -She is a lifelong smoker. -Platelet counts have been elevated independent of iron levels. -LDH is slightly elevated.  CRP has been normal. -Patient had bone marrow biopsy on 09/11/2021 which showed hypercellular bone marrow with mild megakaryocytic hyperplasia.  Per pathologist, this still could represent a reactive process, the persistence of her thrombocytosis is concerning and repeat BCR/ABL and NexGen myeloid panel were recommended.  Marrow showed increased histiocytic iron stores  Cytogenetics with normal female karyotype 46, XX[20] BCR/ABL negative.  JAK2 V617F negative.  JAK2 exon 12-13 negative.  CALR negative. 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  In some studies of patients with confirmed JAK2 MPN's, additional mutation of NF1 cause increased severity of MPN.  However, little data concerning isolated mutations of NF1 alone.  CBC    Component Value Date/Time   WBC 6.8 09/02/2023 1004   WBC 7.6 09/11/2021 0757   RBC 3.90 09/02/2023 1004   RBC 4.01 09/11/2021 0757   HGB 11.8 09/02/2023 1004   HCT 35.6 09/02/2023 1004   PLT 515 (H) 09/02/2023 1004   MCV 91 09/02/2023 1004   MCH 30.3 09/02/2023 1004   MCH 30.4 09/11/2021 0757   MCHC 33.1 09/02/2023 1004   MCHC 32.8 09/11/2021 0757    RDW 13.2 09/02/2023 1004   LYMPHSABS 1.7 09/02/2023 1004   MONOABS 0.8 09/11/2021 0757   EOSABS 0.4 09/02/2023 1004   BASOSABS 0.0 09/02/2023 1004       Latest Ref Rng & Units 09/02/2023   10:04 AM 06/01/2023   10:29 AM 02/18/2023   11:44 AM  CMP  Glucose 70 - 99 mg/dL 96  92  88   BUN 8 - 27 mg/dL 27  24  33   Creatinine 0.57 - 1.00 mg/dL 8.58  8.69  8.80   Sodium 134 - 144 mmol/L 141  142  139   Potassium 3.5 - 5.2 mmol/L 4.8  4.8  4.9   Chloride 96 - 106 mmol/L 102  102  98   CO2 20 - 29 mmol/L  23  16  27    Calcium 8.7 - 10.3 mg/dL 9.8  9.3  9.9   Total Protein 6.0 - 8.5 g/dL 6.5  6.6  6.8   Total Bilirubin 0.0 - 1.2 mg/dL 0.5  0.3  0.5   Alkaline Phos 44 - 121 IU/L 136  122  194   AST 0 - 40 IU/L 20  19  20    ALT 0 - 32 IU/L 18  13  61      Lab Results  Component Value Date   FERRITIN 233 (H) 09/02/2023   VITAMINB12 676 09/02/2023    Vitals:   09/22/23 1313  BP: 124/65  Pulse: 65  Resp: 20  Temp: 97.7 F (36.5 C)  SpO2: 99%    Review of System:  Review of Systems  Constitutional:  Positive for malaise/fatigue.  Respiratory:  Positive for shortness of breath.   Gastrointestinal:  Positive for constipation and diarrhea. Negative for blood in stool.  Musculoskeletal:  Positive for back pain.  Neurological:  Negative for dizziness and headaches.    Physical Exam: Physical Exam Constitutional:      Appearance: Normal appearance.  HENT:     Head: Normocephalic and atraumatic.  Eyes:     Pupils: Pupils are equal, round, and reactive to light.  Cardiovascular:     Rate and Rhythm: Normal rate and regular rhythm.     Heart sounds: Normal heart sounds. No murmur heard. Pulmonary:     Effort: Pulmonary effort is normal.     Breath sounds: Normal breath sounds. No wheezing.  Abdominal:     General: Bowel sounds are normal. There is no distension.     Palpations: Abdomen is soft.     Tenderness: There is no abdominal tenderness.  Musculoskeletal:         General: Normal range of motion.     Cervical back: Normal range of motion.  Skin:    General: Skin is warm and dry.     Findings: No rash.  Neurological:     Mental Status: She is alert and oriented to person, place, and time.     Gait: Gait is intact.  Psychiatric:        Mood and Affect: Mood and affect normal.        Cognition and Memory: Memory normal.        Judgment: Judgment normal.      I spent 20 minutes dedicated to the care of this patient (face-to-face and non-face-to-face) on the date of the encounter to include what is described in the assessment and plan.,  Delon Hope, NP 09/22/2023 1:55 PM

## 2023-09-22 NOTE — Telephone Encounter (Signed)
 Yes that is fine, have her take the Zetia .

## 2023-09-22 NOTE — Assessment & Plan Note (Addendum)
-  Vitamin B12 level is around 600. -She received 3 months worth of B12 injections.  Reports they helped quite a bit with her energy levels. -MMA has normalized. -Recommend monthly B12 shots at home by her daughter.  New Rx sent. -Recheck labs in 6 months.

## 2023-09-22 NOTE — Assessment & Plan Note (Addendum)
-  This is a combination of CKD and iron deficiency state. - EGD (09/20/2019): Gastritis, gastric polyp, hiatal hernia - She had hiatal hernia repair November 2021 - Last Feraheme  infusion was on 10/26/2019 and 11/02/2019 -  She reports occasional dark and sticky stools, but denies any black bowel movements.  No bright red blood per rectum. - She had a positive Cologuard test on 03/11/2021. 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  I am not able to see pathology results. - She complains of significant fatigue, which is chronic -Labs from 09/02/2023 show hemoglobin of 11.8, ferritin 233, iron saturation 22% with a normal TIBC. -In the setting of CKD, would recommend iron saturations closer to 30 to 35%. -We discussed 1 dose of IV Feraheme  given she tolerated these well in the past. -Recheck labs in 6 months.  She is unable to tolerate oral iron.

## 2023-09-22 NOTE — Assessment & Plan Note (Addendum)
-  Will continue active surveillance. -Platelets are more or less stable. - No indication for cytoreductive therapy at this time.  Can continue active surveillance, but would consider Hydrea if patient had platelet count over 1 million.  - Continue aspirin 81 mg daily to decrease risk of VTE in setting of thrombocytosis. - Repeat labs (PCP) RTC in 6 months.

## 2023-09-26 ENCOUNTER — Telehealth: Payer: Self-pay | Admitting: Family Medicine

## 2023-09-26 NOTE — Telephone Encounter (Signed)
 Copied from CRM [id number]. Topic: General - Call Back - No Documentation >> Sep 26, 2023 12:08 PM Tobias L wrote: Reason for CRM: Patient returning call to [facility]. Patient unsure who called.   Will follow up with other [facility] providers she has.

## 2023-09-29 ENCOUNTER — Ambulatory Visit: Admitting: Orthopaedic Surgery

## 2023-09-29 ENCOUNTER — Encounter: Payer: Self-pay | Admitting: Orthopaedic Surgery

## 2023-09-29 DIAGNOSIS — G8929 Other chronic pain: Secondary | ICD-10-CM

## 2023-09-29 DIAGNOSIS — M545 Low back pain, unspecified: Secondary | ICD-10-CM | POA: Diagnosis not present

## 2023-09-29 NOTE — Progress Notes (Signed)
 I am better.  She says the injection by Dr. Eldonna has really helped.  She is 60% better and does not have that severe deep pain she had.  She is walking better and sleeping better.  ROM of the back is still decreased and tender but muscle tone and strength normal, NV intact, gait normal.  Encounter Diagnosis  Name Primary?   Chronic bilateral low back pain without sciatica Yes   I am retiring next week.  She prefers to be seen as needed.  She can be seen here by Herlene or in the [place] or [place] office also.  She will let us  know.  Call if any problem.  Precautions discussed.  Electronically Signed Lemond Stable, MD 9/25/202510:19 AM

## 2023-09-30 ENCOUNTER — Inpatient Hospital Stay

## 2023-09-30 VITALS — BP 130/67 | HR 60 | Temp 97.9°F | Resp 18

## 2023-09-30 DIAGNOSIS — D638 Anemia in other chronic diseases classified elsewhere: Secondary | ICD-10-CM

## 2023-09-30 DIAGNOSIS — D508 Other iron deficiency anemias: Secondary | ICD-10-CM | POA: Diagnosis not present

## 2023-09-30 MED ORDER — SODIUM CHLORIDE 0.9 % IV SOLN
510.0000 mg | Freq: Once | INTRAVENOUS | Status: AC
  Administered 2023-09-30: 510 mg via INTRAVENOUS
  Filled 2023-09-30: qty 510

## 2023-09-30 MED ORDER — SODIUM CHLORIDE 0.9 % IV SOLN: Freq: Once | INTRAVENOUS | Status: AC

## 2023-09-30 NOTE — Progress Notes (Signed)
 Patient tolerated iron infusion with no complaints voiced.  Peripheral IV site clean and dry with good blood return noted before and after infusion.  Band aid applied.  VSS with discharge and left in satisfactory condition with no s/s of distress noted.

## 2023-09-30 NOTE — Patient Instructions (Signed)

## 2023-10-11 ENCOUNTER — Ambulatory Visit: Payer: Self-pay

## 2023-10-11 NOTE — Telephone Encounter (Signed)
 FYI Only or Action Required?: FYI only for provider.  Patient was last seen in primary care on 09/02/2023 by Dettinger, Fonda LABOR, MD.  Called Nurse Triage reporting Diarrhea.  Symptoms began today.  Interventions attempted: OTC medications: Imodium .  Symptoms are: gradually worsening.  Triage Disposition: See Physician Within 24 Hours  Patient/caregiver understands and will follow disposition?: Yes         Copied from CRM #[id number]. Topic: Clinical - Red Word Triage >> Oct 11, 2023  3:25 PM Rosaria BRAVO wrote: Red Word that prompted transfer to Nurse Triage: Stomach/Chest pain and diarrhea (6x today, took imodium ) feels sick   ----------------------------------------------------------------------- From previous Reason for Contact - Scheduling: Patient/patient representative is calling to schedule an appointment. Refer to attachments for appointment information. Reason for Disposition  [1] SEVERE diarrhea (e.g., 7 or more times / day more than normal) AND [2] present > 24 hours (1 day)  Answer Assessment - Initial Assessment Questions Patient states she took immodium for symptoms and states the diarrhea has subsided since.   1. DIARRHEA SEVERITY: How bad is the diarrhea? How many more stools have you had in the past 24 hours than normal?      6-7 episodes  2. ONSET: When did the diarrhea begin?      This morning  3. STOOL DESCRIPTION:  How loose or watery is the diarrhea? What is the stool color? Is there any blood or mucous in the stool?     Watery and loose  4. VOMITING: Are you also vomiting? If Yes, ask: How many times in the past 24 hours?      Denies  5. ABDOMEN PAIN: Are you having any abdomen pain? If Yes, ask: What does it feel like? (e.g., crampy, dull, intermittent, constant)      Sore feeling  6. ABDOMEN PAIN SEVERITY: If present, ask: How bad is the pain?  (e.g., Scale 1-10; mild, moderate, or severe)     5 or 6 out of 10  7. ORAL INTAKE:  If vomiting, Have you been able to drink liquids? How much liquids have you had in the past 24 hours?     Yes, able to drink ok  8. HYDRATION: Any signs of dehydration? (e.g., dry mouth [not just dry lips], too weak to stand, dizziness, new weight loss) When did you last urinate?     Denies  9. OTHER SYMPTOMS: Do you have any other symptoms? (e.g., fever, blood in stool)       Body aches, some weakness and nausea  Protocols used: Diarrhea-A-AH

## 2023-10-11 NOTE — Telephone Encounter (Signed)
 FYI- patient scheduled to be seen tomorrow for this issue.

## 2023-10-12 ENCOUNTER — Ambulatory Visit: Admitting: Nurse Practitioner

## 2023-10-12 ENCOUNTER — Encounter: Payer: Self-pay | Admitting: Nurse Practitioner

## 2023-10-12 VITALS — BP 113/62 | HR 78 | Temp 98.0°F | Ht 63.0 in | Wt 148.4 lb

## 2023-10-12 DIAGNOSIS — N3000 Acute cystitis without hematuria: Secondary | ICD-10-CM | POA: Insufficient documentation

## 2023-10-12 DIAGNOSIS — R197 Diarrhea, unspecified: Secondary | ICD-10-CM | POA: Insufficient documentation

## 2023-10-12 DIAGNOSIS — R6889 Other general symptoms and signs: Secondary | ICD-10-CM | POA: Insufficient documentation

## 2023-10-12 DIAGNOSIS — R300 Dysuria: Secondary | ICD-10-CM | POA: Diagnosis not present

## 2023-10-12 DIAGNOSIS — Z8719 Personal history of other diseases of the digestive system: Secondary | ICD-10-CM | POA: Diagnosis not present

## 2023-10-12 LAB — VERITOR FLU A/B WAIVED
Influenza A: NEGATIVE
Influenza B: NEGATIVE

## 2023-10-12 LAB — MICROSCOPIC EXAMINATION
Epithelial Cells (non renal): >10 /HPF — AB (ref 0–10)
RBC, Urine: NONE SEEN /HPF (ref 0–2)
Renal Epithel, UA: NONE SEEN /HPF

## 2023-10-12 LAB — URINALYSIS, ROUTINE W REFLEX MICROSCOPIC
Bilirubin, UA: NEGATIVE
Glucose, UA: NEGATIVE
Ketones, UA: NEGATIVE
Nitrite, UA: NEGATIVE
RBC, UA: NEGATIVE
Specific Gravity, UA: 1.015 (ref 1.005–1.030)
Urobilinogen, Ur: 0.2 mg/dL (ref 0.2–1.0)
pH, UA: 5.5 (ref 5.0–7.5)

## 2023-10-12 MED ORDER — NITROFURANTOIN MONOHYD MACRO 100 MG PO CAPS: 100.0000 mg | ORAL_CAPSULE | Freq: Two times a day (BID) | ORAL | 0 refills | Status: DC

## 2023-10-12 MED ORDER — FLUCONAZOLE 150 MG PO TABS: 150.0000 mg | ORAL_TABLET | Freq: Once | ORAL | 0 refills | Status: AC

## 2023-10-12 NOTE — Progress Notes (Addendum)
 Subjective:  Patient ID: Maria Moss, female    DOB: 1950-04-08, 73 y.o.   MRN: [id number]  Patient Care Team: Dettinger, Fonda LABOR, MD as PCP - General (Family Medicine) Trixie File, MD as Consulting Physician (Internal Medicine)   Chief Complaint:  Diarrhea, Nausea, Fatigue, Chills, and Generalized Body Aches   HPI: Maria Moss is a 73 y.o. female presenting on 10/12/2023 for Diarrhea, Nausea, Fatigue, Chills, and Generalized Body Aches   Discussed the use of AI scribe software for clinical note transcription with the patient, who gave verbal consent to proceed.  History of Present Illness Maria Moss is a 73 year old female with colitis who presents with diarrhea and body aches.  She has been experiencing severe diarrhea and body aches since yesterday. The diarrhea is described as 'mushy and dark.' She has not eaten since taking two doses of Imodium  and has not had diarrhea again, but it is unclear if the diarrhea has resolved. She feels 'real weak' and 'aching all over.'  She has a history of colitis, with the last episode occurring approximately a year ago, treated at [facility]. Her current symptoms resemble those of previous colitis episodes. She has been taking Tylenol  for body aches, which provides some relief.  No fever is present, but she feels cold and experiences chills despite warm ambient temperature. She has a history of IBS and chronic stomach problems since childhood, with various diagnoses over the years. She takes probiotics regularly. She is under the care of GI, next appoint in Nov  Her current medications include doxepin at night, pantoprazole , and Pepcid . She has a history of abdominal surgeries, including a hernia repair, and reports some residual pain at the surgical site, which she was told is due to scar tissue.  In the review of symptoms, she denies fever but confirms chills, fatigue, and abdominal pain that was severe yesterday but has improved today.  She also reports a history of bleeding after bowel movements during colitis episodes.      Relevant past medical, surgical, family, and social history reviewed and updated as indicated.  Allergies and medications reviewed and updated. Data reviewed: Chart in Epic.   Past Medical History:  Diagnosis Date   Anemia    iron deficiency   Ankle fracture, left 2006   Diverticula, intestine    Mild case.   Fibromyalgia    Dr. Ethel Dx in [place] years ago  no meds    GERD (gastroesophageal reflux disease)    History of hiatal hernia    Hyperlipidemia    Hyperparathyroidism    Hypertension    Dx age 81.    Stroke [facility])    Questionable stoke when born or polio not sure pt. has right side weakness toes right foot turns outward and toes curled up some     Past Surgical History:  Procedure Laterality Date   ANKLE SURGERY Left    Broken.  Has screws and metal plate.   CHOLECYSTECTOMY     PARATHYROIDECTOMY Right 05/29/2019   Procedure: RIGHT INFERIOR PARATHYROIDECTOMY;  Surgeon: Eletha Boas, MD;  Location: [facility];  Service: General;  Laterality: Right;   ROBOTIC ADRENALECTOMY Right 05/31/2018   Procedure: XI ROBOTIC RIGHT ADRENALECTOMY;  Surgeon: Rubin Calamity, MD;  Location: [facility];  Service: General;  Laterality: Right;   THYROID  LOBECTOMY Right 05/29/2019   Procedure: RIGHT THYROID  LOBECTOMY;  Surgeon: Eletha Boas, MD;  Location: [facility];  Service: General;  Laterality: Right;  Social History   Socioeconomic History   Marital status: Married    Spouse name: Not on file   Number of children: Not on file   Years of education: Not on file   Highest education level: Not on file  Occupational History   Not on file  Tobacco Use   Smoking status: Never   Smokeless tobacco: Never  Vaping Use   Vaping status: Never Used  Substance and Sexual Activity   Alcohol use: No   Drug use: No   Sexual activity: Not Currently  Other Topics Concern   Not on file  Social History  Narrative   Not on file   Social Drivers of Health   Financial Resource Strain: Low Risk  (01/28/2023)   Overall Financial Resource Strain (CARDIA)    Difficulty of Paying Living Expenses: Not hard at all  Food Insecurity: No Food Insecurity (01/28/2023)   Hunger Vital Sign    Worried About Running Out of Food in the Last Year: Never true    Ran Out of Food in the Last Year: Never true  Transportation Needs: No Transportation Needs (01/28/2023)   PRAPARE - Administrator, Civil Service (Medical): No    Lack of Transportation (Non-Medical): No  Physical Activity: Insufficiently Active (01/28/2023)   Exercise Vital Sign    Days of Exercise per Week: 3 days    Minutes of Exercise per Session: 30 min  Stress: No Stress Concern Present (01/28/2023)   [employer] of Occupational Health - Occupational Stress Questionnaire    Feeling of Stress : Not at all  Social Connections: Socially Integrated (01/28/2023)   Social Connection and Isolation Panel    Frequency of Communication with Friends and Family: More than three times a week    Frequency of Social Gatherings with Friends and Family: Three times a week    Attends Religious Services: More than 4 times per year    Active Member of Clubs or Organizations: Yes    Attends Banker Meetings: More than 4 times per year    Marital Status: Married  Catering manager Violence: Not At Risk (01/28/2023)   Humiliation, Afraid, Rape, and Kick questionnaire    Fear of Current or Ex-Partner: No    Emotionally Abused: No    Physically Abused: No    Sexually Abused: No    Outpatient Encounter Medications as of 10/12/2023  Medication Sig   acetaminophen  (TYLENOL ) 500 MG tablet Take by mouth.   alum & mag hydroxide-simeth (MAALOX PLUS) 400-400-40 MG/5ML suspension Take by mouth.   amLODipine  (NORVASC ) 5 MG tablet Take 1 tablet (5 mg total) by mouth daily.   aspirin EC 81 MG tablet Take 81 mg by mouth daily. Swallow whole.    Bempedoic Acid  (NEXLETOL ) 180 MG TABS Take 1 tablet (180 mg total) by mouth daily at 12 noon.   carvedilol (COREG) 6.25 MG tablet Take by mouth.   cetirizine (ZYRTEC) 10 MG tablet Take 10 mg by mouth daily.   Cholecalciferol 25 MCG (1000 UT) tablet Take 3,000 Units by mouth daily.    cyanocobalamin  (VITAMIN B12) 1000 MCG/ML injection Inject 1 mL (1,000 mcg total) into the muscle every 30 (thirty) days.   diazepam  (VALIUM ) 5 MG tablet Take one tablet by mouth with light food one hour prior to procedure.   dicyclomine (BENTYL) 20 MG tablet Take 20 mg by mouth every 6 (six) hours.   doxepin (SINEQUAN) 10 MG capsule Take 10 mg by mouth at bedtime.  DULoxetine  (CYMBALTA ) 60 MG capsule Take 1 capsule (60 mg total) by mouth daily.   ezetimibe  (ZETIA ) 10 MG tablet Take 1 tablet (10 mg total) by mouth at bedtime.   famotidine  (PEPCID ) 40 MG tablet one half tablet (20 mg dose) every morning.   FIBER PO Take by mouth.   fluconazole  (DIFLUCAN ) 150 MG tablet Take 1 tablet (150 mg total) by mouth once for 1 dose.   fluticasone  (FLONASE ) 50 MCG/ACT nasal spray USE 2 SPRAYS IN EACH NOSTRIL EVERY DAY   furosemide  (LASIX ) 20 MG tablet TAKE 1 TABLET EVERY DAY   loperamide  (IMODIUM  A-D) 2 MG tablet Take 1 tablet (2 mg total) by mouth 4 (four) times daily as needed for diarrhea or loose stools.   nitrofurantoin , macrocrystal-monohydrate, (MACROBID ) 100 MG capsule Take 1 capsule (100 mg total) by mouth 2 (two) times daily. 1 po BId   Omega-3 Fatty Acids (FISH OIL) 1000 MG CAPS Take 1,000 mg by mouth daily.    ondansetron  (ZOFRAN ) 4 MG tablet Take 1 tablet (4 mg total) by mouth every 8 (eight) hours as needed for nausea or vomiting.   pantoprazole  (PROTONIX ) 40 MG tablet Take 1 tablet (40 mg total) by mouth daily.   Probiotic Product (PROBIOTIC & ACIDOPHILUS EX ST PO) Take 1 capsule by mouth at bedtime.    telmisartan (MICARDIS) 20 MG tablet Take 20 mg by mouth daily.   traMADol  (ULTRAM ) 50 MG tablet Take 1 tablet  (50 mg total) by mouth every 8 (eight) hours as needed (mild pain).   traZODone  (DESYREL ) 100 MG tablet Take 1 tablet (100 mg total) by mouth at bedtime.   No facility-administered encounter medications on file as of 10/12/2023.    Allergies  Allergen Reactions   Codeine Nausea And Vomiting   Glycopyrrolate     constipation   Sulfa Antibiotics     Hair loss, yellowed skin   Doxycycline Hyclate Itching    Pertinent ROS per HPI, otherwise unremarkable      Objective:  BP 113/62   Pulse 78   Temp 98 F (36.7 C)   Ht 5' 3 (1.6 m)   Wt 148 lb 6.4 oz (67.3 kg)   SpO2 97%   BMI 26.29 kg/m    Wt Readings from Last 3 Encounters:  10/12/23 148 lb 6.4 oz (67.3 kg)  09/22/23 149 lb 14.6 oz (68 kg)  09/02/23 147 lb (66.7 kg)    Physical Exam Vitals and nursing note reviewed.  Constitutional:      General: She is not in acute distress. HENT:     Head: Normocephalic and atraumatic.     Nose: Nose normal.     Mouth/Throat:     Mouth: Mucous membranes are moist.  Eyes:     General: No scleral icterus.    Extraocular Movements: Extraocular movements intact.     Conjunctiva/sclera: Conjunctivae normal.     Pupils: Pupils are equal, round, and reactive to light.  Cardiovascular:     Heart sounds: Normal heart sounds.  Pulmonary:     Effort: Pulmonary effort is normal.     Breath sounds: Normal breath sounds.  Abdominal:     General: Bowel sounds are normal.     Palpations: Abdomen is soft. There is no mass.     Tenderness: There is no abdominal tenderness. There is no rebound.  Musculoskeletal:        General: Normal range of motion.     Right lower leg: No edema.  Left lower leg: No edema.  Skin:    General: Skin is warm and dry.     Findings: No rash.  Neurological:     Mental Status: She is alert and oriented to person, place, and time.  Psychiatric:        Mood and Affect: Mood normal.        Behavior: Behavior normal.        Thought Content: Thought content  normal.        Judgment: Judgment normal.    Physical Exam    Urine dipstick shows positive for WBC's trace and positive for leukocytes 2+.   Micro exam: 11-30 WBC's per HPF, moderate + bacteria, yeast present and epithelial cells greater than 10.     Flu A & B negative  Results for orders placed or performed in visit on 09/02/23  Iron and TIBC   Collection Time: 09/02/23 10:00 AM  Result Value Ref Range   Total Iron Binding Capacity 282 250 - 450 ug/dL   UIBC 780 881 - 630 ug/dL   Iron 63 27 - 860 ug/dL   Iron Saturation 22 15 - 55 %  Methylmalonic acid, serum   Collection Time: 09/02/23 10:00 AM  Result Value Ref Range   Methylmalonic Acid 352 0 - 378 nmol/L  Lactate dehydrogenase   Collection Time: 09/02/23 10:00 AM  Result Value Ref Range   LDH 182 119 - 226 IU/L  Vitamin B12   Collection Time: 09/02/23 10:00 AM  Result Value Ref Range   Vitamin B-12 676 232 - 1,245 pg/mL  Ferritin   Collection Time: 09/02/23 10:00 AM  Result Value Ref Range   Ferritin 233 (H) 15 - 150 ng/mL       Pertinent labs & imaging results that were available during my care of the patient were reviewed by me and considered in my medical decision making.  Assessment & Plan:  Maria Moss was seen today for diarrhea, nausea, fatigue, chills and generalized body aches.  Diagnoses and all orders for this visit:  Acute cystitis without hematuria -     Urine Culture -     nitrofurantoin , macrocrystal-monohydrate, (MACROBID ) 100 MG capsule; Take 1 capsule (100 mg total) by mouth 2 (two) times daily. 1 po BId -     fluconazole  (DIFLUCAN ) 150 MG tablet; Take 1 tablet (150 mg total) by mouth once for 1 dose.  History of colitis -     CBC with Differential/Platelet -     CMP14+EGFR -     Stool culture  Diarrhea, unspecified type -     Stool culture -     CBC with Differential/Platelet -     CMP14+EGFR  Flu-like symptoms -     Veritor Flu A/B Waived  Dysuria -     Urinalysis, Routine w reflex  microscopic -     Urine Culture -     nitrofurantoin , macrocrystal-monohydrate, (MACROBID ) 100 MG capsule; Take 1 capsule (100 mg total) by mouth 2 (two) times daily. 1 po BId -     fluconazole  (DIFLUCAN ) 150 MG tablet; Take 1 tablet (150 mg total) by mouth once for 1 dose.     Assessment and Plan Maria Moss is a 73 year old Caucasian female seen today for diarrhea, no acute distress Assessment & Plan Acute diarrhea with generalized body aches and fatigue Acute diarrhea persists despite Imodium . Persistent body aches and fatigue. Differential includes colitis, influenza, and COVID-19. Chills without fever suggest flu or COVID-19. - Order stool culture to  evaluate for colitis. - Swab for influenza. - Order CBC and CMP. - Instructed to take stool sample kit home and return to lab. - Await flu swab results, negative - Continue Imodium  for diarrhea - Increase hydration; bland diet - Diflucan  150 mg 1 pill dispensed for likely treatment for yeast post antibiotic  Colitis History of colitis with hospitalization. Current symptoms resemble past episodes. No current abdominal pain. Regular probiotic use. Previous treatments included antibiotics and pantoprazole . - Continue probiotics, doxepin, and pantoprazole   - Follow up with gastroenterology in three months.      Continue all other maintenance medications.  Follow up plan: Return if symptoms worsen or fail to improve.   Continue healthy lifestyle choices, including diet (rich in fruits, vegetables, and lean proteins, and low in salt and simple carbohydrates) and exercise (at least 30 minutes of moderate physical activity daily).  Educational handout given for    Clinical References  Urinary Tract Infection, Female A urinary tract infection (UTI) is an infection in your urinary tract. The urinary tract is made up of organs that make, store, and get rid of pee (urine) in your body. These organs include: The kidneys. The ureters. The  bladder. The urethra. What are the causes? Most UTIs are caused by germs called bacteria. They may be in or near your genitals. These germs grow and cause swelling in your urinary tract. What increases the risk? You're more likely to get a UTI if: You're a female. The urethra is shorter in females than in males. You have a soft tube called a catheter that drains your pee. You can't control when you pee or poop. You have trouble peeing because of: A kidney stone. A urinary blockage. A nerve condition that affects your bladder. Not getting enough to drink. You're sexually active. You use a birth control inside your vagina, like spermicide. You're pregnant. You have low levels of the hormone estrogen in your body. You're an older adult. You're also more likely to get a UTI if you have other health problems. These may include: Diabetes. A weak immune system. Your immune system is your body's defense system. Sickle cell disease. Injury of the spine. What are the signs or symptoms? Symptoms may include: Needing to pee right away. Peeing small amounts often. Pain or burning when you pee. Blood in your pee. Pee that smells bad or odd. Pain in your belly or lower back. You may also: Feel confused. This may be the first symptom in older adults. Vomit. Not feel hungry. Feel tired or easily annoyed. Have a fever or chills. How is this diagnosed? A UTI is diagnosed based on your medical history and an exam. You may also have other tests. These may include: Pee tests. Blood tests. Tests for sexually transmitted infections (STIs). If you've had more than one UTI, you may need to have imaging studies done to find out why you keep getting them. How is this treated? A UTI can be treated by: Taking antibiotics or other medicines. Drinking enough fluid to keep your pee pale yellow. In rare cases, a UTI can cause a very bad condition called sepsis. Sepsis may be treated in the  hospital. Follow these instructions at home: Medicines Take your medicines only as told by your health care provider. If you were given antibiotics, take them as told by your provider. Do not stop taking them even if you start to feel better. General instructions Make sure you: Pee often and fully. Do not hold your pee  for a long time. Wipe from front to back after you pee or poop. Use each tissue only once when you wipe. Pee after you have sex. Do not douche or use sprays or powders in your genital area. Contact a health care provider if: Your symptoms don't get better after 1-2 days of taking antibiotics. Your symptoms go away and then come back. You have a fever or chills. You vomit or feel like you may vomit. Get help right away if: You have very bad pain in your back or lower belly. You faint. This information is not intended to replace advice given to you by your health care provider. Make sure you discuss any questions you have with your health care provider. Document Revised: 12/01/2022 Document Reviewed: 03/26/2022 Elsevier Patient Education  2025 [employer]. Diarrhea, Adult Diarrhea is when you pass loose and sometimes watery poop (stool) often. Diarrhea can make you feel weak and cause you to lose water in your body (get dehydrated). Losing water in your body can cause you to: Feel tired and thirsty. Have a dry mouth. Go pee (urinate) less often. Diarrhea often lasts 2-3 days. It can last longer if it is a sign of something more serious. Be sure to treat your diarrhea as told by your doctor. Follow these instructions at home: Eating and drinking     Follow these instructions as told by your doctor: Take an ORS (oral rehydration solution). This is a drink that helps you replace fluids and minerals your body lost. It is sold at pharmacies and stores. Drink enough fluid to keep your pee (urine) pale yellow. Drink fluids such as: Water. You can also get fluids by  sucking on ice chips. Diluted fruit juice. Low-calorie sports drinks. Milk. Avoid drinking fluids that have a lot of sugar or caffeine in them. These include soda, energy drinks, and regular sports drinks. Avoid alcohol. Eat bland, easy-to-digest foods in small amounts as you are able. These foods include: Bananas. Applesauce. Rice. Low-fat (lean) meats. Toast. Crackers. Avoid spicy or fatty foods.   Medicines Take over-the-counter and prescription medicines only as told by your doctor. If you were prescribed antibiotics, take them as told by your doctor. Do not stop taking them even if you start to feel better. General instructions  Wash your hands often using soap and water for 20 seconds. If soap and water are not available, use hand sanitizer. Others in your home should wash their hands as well. Wash your hands: After using the toilet or changing a diaper. Before preparing, cooking, or serving food. While caring for a sick person. While visiting someone in a hospital. Rest at home while you get better. Take a warm bath to help with any burning or pain from having diarrhea. Watch your condition for any changes. Contact a doctor if: You have a fever. Your diarrhea gets worse. You have new symptoms. You vomit every time you eat or drink. You feel light-headed, dizzy, or you have a headache. You have muscle cramps. You have signs of losing too much water in your body, such as: Dark pee, very little pee, or no pee. Cracked lips. Dry mouth. Sunken eyes. Sleepiness. Weakness. You have bloody or black poop or poop that looks like tar. You have very bad pain, cramping, or bloating in your belly (abdomen). Your skin feels cold and clammy. You feel confused. Get help right away if: You have chest pain. Your heart is beating very quickly. You have trouble breathing or you are  breathing very quickly. You feel very weak or you faint. These symptoms may be an emergency. Get  help right away. Call 911. Do not wait to see if the symptoms will go away. Do not drive yourself to the hospital. This information is not intended to replace advice given to you by your health care provider. Make sure you discuss any questions you have with your health care provider. Document Revised: 06/09/2021 Document Reviewed: 06/09/2021 Elsevier Patient Education  2024 Else[employer] Inc. Dysuria Dysuria is pain or discomfort when you pee. The pain may be felt in your urethra, which is the part of your body that drains pee (urine) from your bladder. The pain may also be felt near your genitals, groin, or in your lower belly or back. You may have to pee often or have the sudden feeling that you need to pee. This condition can affect anyone, but it's more common in females. It can be caused by: A urinary tract infection (UTI). Kidney stones or bladder stones. Some sexually transmitted infections (STIs). Dehydration. This is when there's not enough water in your body. Irritation and swelling in the vagina. The use of some medicines. The use of some soaps or products with a scent. Follow these instructions at home: Medicines  Take your medicines only as told. Take your antibiotics as told. Do not stop taking them even if you start to feel better. Eating and drinking Drink enough fluid to keep your pee pale yellow. Certain drinks can make the pain worse. Avoid: Drinks with caffeine in them. Tea. Alcohol. In males, alcohol may irritate the prostate. General instructions Watch your condition for any changes, such as color changes in your pee. Pee often. Do not hold your pee for a long time. If you're female, wipe from front to back after you pee or poop. Use each tissue only once when you wipe. Pee after you have sex. If you've had any tests done, it's up to you to get your test results. Ask your health care provider, or the department doing the test, when your results will be  ready. Contact a health care provider if: You have a fever or chills. You have pain in your back or sides. You throw up or feel like you may throw up. You have blood in your pee. You're not peeing as often as normal. You feel very weak. Get help right away if: You have very bad pain that doesn't get better with medicine. You're confused. You have a fast heartbeat while resting. This information is not intended to replace advice given to you by your health care provider. Make sure you discuss any questions you have with your health care provider. Document Revised: 04/27/2022 Document Reviewed: 04/27/2022 Elsevier Patient Education  2024 [employer]. may speak to Beatty Mcgruder place just  The above assessment and management plan was discussed with the patient. The patient verbalized understanding of and has agreed to the management plan. Patient is aware to call the clinic if they develop any new symptoms or if symptoms persist or worsen. Patient is aware when to return to the clinic for a follow-up visit. Patient educated on when it is appropriate to go to the emergency department.   Ercel Pepitone [place] Thompson, DNP [facility] [address] [place], [place] [address] [phone]

## 2023-10-13 ENCOUNTER — Ambulatory Visit: Payer: Self-pay | Admitting: Nurse Practitioner

## 2023-10-13 ENCOUNTER — Other Ambulatory Visit: Payer: Self-pay | Admitting: Nurse Practitioner

## 2023-10-13 ENCOUNTER — Ambulatory Visit: Payer: Self-pay

## 2023-10-13 DIAGNOSIS — N3000 Acute cystitis without hematuria: Secondary | ICD-10-CM

## 2023-10-13 DIAGNOSIS — R945 Abnormal results of liver function studies: Secondary | ICD-10-CM

## 2023-10-13 LAB — CBC WITH DIFFERENTIAL/PLATELET
Basophils Absolute: 0 x10E3/uL (ref 0.0–0.2)
Basos: 0 %
EOS (ABSOLUTE): 0.1 x10E3/uL (ref 0.0–0.4)
Eos: 1 %
Hematocrit: 36.5 % (ref 34.0–46.6)
Hemoglobin: 12.2 g/dL (ref 11.1–15.9)
Immature Grans (Abs): 0 x10E3/uL (ref 0.0–0.1)
Immature Granulocytes: 0 %
Lymphocytes Absolute: 0.8 x10E3/uL (ref 0.7–3.1)
Lymphs: 13 %
MCH: 30.2 pg (ref 26.6–33.0)
MCHC: 33.4 g/dL (ref 31.5–35.7)
MCV: 90 fL (ref 79–97)
Monocytes Absolute: 0.6 x10E3/uL (ref 0.1–0.9)
Monocytes: 10 %
Neutrophils Absolute: 4.4 x10E3/uL (ref 1.4–7.0)
Neutrophils: 76 %
Platelets: 418 x10E3/uL (ref 150–450)
RBC: 4.04 x10E6/uL (ref 3.77–5.28)
RDW: 12.2 % (ref 11.7–15.4)
WBC: 5.9 x10E3/uL (ref 3.4–10.8)

## 2023-10-13 LAB — CMP14+EGFR
ALT: 558 IU/L — AB (ref 0–32)
AST: 428 IU/L — AB (ref 0–40)
Albumin: 4.2 g/dL (ref 3.8–4.8)
Alkaline Phosphatase: 315 IU/L — AB (ref 49–135)
BUN/Creatinine Ratio: 16 (ref 12–28)
BUN: 25 mg/dL (ref 8–27)
Bilirubin Total: 0.9 mg/dL (ref 0.0–1.2)
CO2: 22 mmol/L (ref 20–29)
Calcium: 9 mg/dL (ref 8.7–10.3)
Chloride: 96 mmol/L (ref 96–106)
Creatinine, Ser: 1.54 mg/dL — AB (ref 0.57–1.00)
Globulin, Total: 2.3 g/dL (ref 1.5–4.5)
Glucose: 86 mg/dL (ref 70–99)
Potassium: 4.4 mmol/L (ref 3.5–5.2)
Sodium: 135 mmol/L (ref 134–144)
Total Protein: 6.5 g/dL (ref 6.0–8.5)
eGFR: 36 mL/min/1.73 — AB (ref >59)

## 2023-10-13 MED ORDER — CEPHALEXIN 500 MG PO CAPS: 500.0000 mg | ORAL_CAPSULE | Freq: Two times a day (BID) | ORAL | 0 refills | Status: DC

## 2023-10-13 NOTE — Telephone Encounter (Signed)
 Saw [facility] yesterday. Patient requesting an alternative antibiotic due to symptoms. Says Imodium  is not helping. Please advise.

## 2023-10-13 NOTE — Progress Notes (Signed)
 Patient sent a note stating that she has not tolerated Macrobid  which was prescribed yesterday during the encounter, new Rx for Keflex  500 mg twice daily sent to her pharmacy she can stop taking Macrobid 

## 2023-10-13 NOTE — Telephone Encounter (Signed)
 FYI Only or Action Required?: Action required by provider: Alternative medication.  Patient was last seen in primary care on 10/12/2023 by Deitra Morton Sebastian Nena, NP.  Called Nurse Triage reporting Medication Problem.  Symptoms began several days ago.  Interventions attempted: OTC medications: Imodium .  Symptoms are: gradually worsening.  Triage Disposition: See HCP Within 4 Hours (Or PCP Triage)  Patient/caregiver understands and will follow disposition?: No, wishes to speak with PCP         Message from [facility] E sent at 10/13/2023  9:40 AM EDT  Summary: Diarrhea, possible med reaction   Reason for Triage: Med reaction, diarrhea. nitrofurantoin , macrocrystal-monohydrate, (MACROBID ) 100 MG capsule  Imodium  not helping  Best contact: [phone]         Reason for Disposition  SEVERE diarrhea (e.g., 7 or more times / day more than normal)  Answer Assessment - Initial Assessment Questions Patient requesting an alternative antibiotic due to symptoms. Patient requesting a call back regarding the request. Preferred pharmacy below.    [facility] - [place], [place] - [address]  [address], [place] [address]      1. ANTIBIOTIC: What antibiotic are you taking? How many times per day?     Macrobid   2. ANTIBIOTIC ONSET: When was the antibiotic started?     10/8 3. DIARRHEA SEVERITY: How bad is the diarrhea? How many more stools have you had in the past 24 hours than normal?      8-10  4. ONSET: When did the diarrhea begin?      10/7 5. BM CONSISTENCY: How loose or watery is the diarrhea?      Loose and watery  6. VOMITING: Are you also vomiting? If Yes, ask: How many times in the past 24 hours?      Denies  7. ABDOMEN PAIN: Are you having any abdomen pain? If Yes, ask: What does it feel like? (e.g., crampy, dull, intermittent, constant)      Denies  8. ABDOMEN PAIN SEVERITY: If present, ask: How bad is the pain?   (e.g., Scale 1-10; mild, moderate, or severe)     Denies  9. ORAL INTAKE: If vomiting, Have you been able to drink liquids? How much liquids have you had in the past 24 hours?     Denies  10. HYDRATION: Any signs of dehydration? (e.g., dry mouth [not just dry lips], too weak to stand, dizziness, new weight loss) When did you last urinate?       30 mins ago she was able to urinate  11. OTHER SYMPTOMS: Do you have any other symptoms? (e.g., fever, blood in stool)       Chills and aches  Protocols used: Diarrhea on Antibiotics-A-AH

## 2023-10-13 NOTE — Telephone Encounter (Signed)
 Patient aware and verbalized understanding.

## 2023-10-13 NOTE — Telephone Encounter (Signed)
 Pt transferred to nurse triage by PAS. Call dropped during transfer. Called pt back, reports she was able to connect with another triage nurse and be evaluated.   Copied from CRM [id number]. Topic: Clinical - Red Word Triage >> Oct 13, 2023 10:20 AM Gustabo D wrote: Reason for Triage: Med reaction, diarrhea. nitrofurantoin , macrocrystal-monohydrate, (MACROBID ) 100 MG capsule  Imodium  not helping   Best contact: [phone] Reason for Disposition . [1] Follow-up call to recent contact AND [2] information only call, no triage required  Answer Assessment - Initial Assessment Questions 1. REASON FOR CALL: What is the main reason for your call? or How can I best help you?     Duplicate triage encounter. No triage at this time.  Protocols used: Information Only Call - No Triage-A-AH

## 2023-10-13 NOTE — Telephone Encounter (Signed)
 Pt cannot tolerate macrobid . Even taking with food she is having diarrhea. Pt states that she feels so bad. Unable to come today and repeat LFT. Appt made for 10/10 at 11:15.  Ok to send in different ATB. Urine culture still pending

## 2023-10-14 ENCOUNTER — Other Ambulatory Visit

## 2023-10-14 DIAGNOSIS — R945 Abnormal results of liver function studies: Secondary | ICD-10-CM

## 2023-10-15 LAB — HEPATIC FUNCTION PANEL
ALT: 219 IU/L — ABNORMAL HIGH (ref 0–32)
AST: 76 IU/L — ABNORMAL HIGH (ref 0–40)
Albumin: 4 g/dL (ref 3.8–4.8)
Alkaline Phosphatase: 325 IU/L — ABNORMAL HIGH (ref 49–135)
Bilirubin Total: 0.3 mg/dL (ref 0.0–1.2)
Bilirubin, Direct: 0.16 mg/dL (ref 0.00–0.40)
Total Protein: 6.3 g/dL (ref 6.0–8.5)

## 2023-10-16 LAB — STOOL CULTURE: E coli, Shiga toxin Assay: NEGATIVE

## 2023-10-17 ENCOUNTER — Other Ambulatory Visit: Payer: Self-pay | Admitting: Family Medicine

## 2023-10-17 ENCOUNTER — Ambulatory Visit: Payer: Self-pay | Admitting: Nurse Practitioner

## 2023-10-17 DIAGNOSIS — R945 Abnormal results of liver function studies: Secondary | ICD-10-CM

## 2023-10-17 NOTE — Telephone Encounter (Signed)
 Copied from CRM [id number]. Topic: Clinical - Lab/Test Results >> Oct 17, 2023  3:44 PM Emylou G wrote: Reason for CRM: patient called.. needs clarity on her results.SABRA and she thought she was going to get an ultrasound? Use this number [phone] (M    Duplicate call

## 2023-10-17 NOTE — Telephone Encounter (Signed)
 Will call patient on lab results

## 2023-10-21 ENCOUNTER — Other Ambulatory Visit

## 2023-10-21 ENCOUNTER — Other Ambulatory Visit: Payer: Self-pay

## 2023-10-21 DIAGNOSIS — I129 Hypertensive chronic kidney disease with stage 1 through stage 4 chronic kidney disease, or unspecified chronic kidney disease: Secondary | ICD-10-CM | POA: Diagnosis not present

## 2023-10-21 DIAGNOSIS — N1832 Chronic kidney disease, stage 3b: Secondary | ICD-10-CM | POA: Diagnosis not present

## 2023-10-21 DIAGNOSIS — E210 Primary hyperparathyroidism: Secondary | ICD-10-CM | POA: Diagnosis not present

## 2023-10-21 DIAGNOSIS — R7989 Other specified abnormal findings of blood chemistry: Secondary | ICD-10-CM

## 2023-10-21 DIAGNOSIS — D631 Anemia in chronic kidney disease: Secondary | ICD-10-CM | POA: Diagnosis not present

## 2023-10-21 DIAGNOSIS — E875 Hyperkalemia: Secondary | ICD-10-CM | POA: Diagnosis not present

## 2023-10-22 LAB — CMP14+EGFR
ALT: 44 IU/L — ABNORMAL HIGH (ref 0–32)
AST: 21 IU/L (ref 0–40)
Albumin: 4.4 g/dL (ref 3.8–4.8)
Alkaline Phosphatase: 215 IU/L — ABNORMAL HIGH (ref 49–135)
BUN/Creatinine Ratio: 15 (ref 12–28)
BUN: 21 mg/dL (ref 8–27)
Bilirubin Total: 0.4 mg/dL (ref 0.0–1.2)
CO2: 24 mmol/L (ref 20–29)
Calcium: 9.6 mg/dL (ref 8.7–10.3)
Chloride: 100 mmol/L (ref 96–106)
Creatinine, Ser: 1.41 mg/dL — ABNORMAL HIGH (ref 0.57–1.00)
Globulin, Total: 2.4 g/dL (ref 1.5–4.5)
Glucose: 90 mg/dL (ref 70–99)
Potassium: 4.9 mmol/L (ref 3.5–5.2)
Sodium: 139 mmol/L (ref 134–144)
Total Protein: 6.8 g/dL (ref 6.0–8.5)
eGFR: 40 mL/min/1.73 — ABNORMAL LOW (ref >59)

## 2023-10-25 ENCOUNTER — Encounter (INDEPENDENT_AMBULATORY_CARE_PROVIDER_SITE_OTHER): Payer: Self-pay | Admitting: *Deleted

## 2023-10-28 ENCOUNTER — Telehealth: Payer: Self-pay | Admitting: Family Medicine

## 2023-10-28 ENCOUNTER — Ambulatory Visit: Payer: Self-pay | Admitting: Family Medicine

## 2023-10-28 NOTE — Telephone Encounter (Signed)
 Will review lab result with patient.

## 2023-10-28 NOTE — Telephone Encounter (Signed)
 Copied from CRM [id number]. Topic: Clinical - Lab/Test Results >> Oct 28, 2023 10:44 AM Deaijah H wrote: Reason for CRM: Patient would like for Dr. Maryanne to take a look results for her liver to advise her if she should still take her cholesterol medication or not. Please call [phone]

## 2023-10-31 ENCOUNTER — Ambulatory Visit (HOSPITAL_COMMUNITY)
Admission: RE | Admit: 2023-10-31 | Discharge: 2023-10-31 | Disposition: A | Discharge status: Home / Self Care | Source: Ambulatory Visit | Attending: Family Medicine | Admitting: Family Medicine

## 2023-10-31 DIAGNOSIS — R7989 Other specified abnormal findings of blood chemistry: Secondary | ICD-10-CM | POA: Diagnosis not present

## 2023-10-31 DIAGNOSIS — E210 Primary hyperparathyroidism: Secondary | ICD-10-CM | POA: Diagnosis not present

## 2023-10-31 DIAGNOSIS — I129 Hypertensive chronic kidney disease with stage 1 through stage 4 chronic kidney disease, or unspecified chronic kidney disease: Secondary | ICD-10-CM | POA: Diagnosis not present

## 2023-10-31 DIAGNOSIS — D631 Anemia in chronic kidney disease: Secondary | ICD-10-CM | POA: Diagnosis not present

## 2023-10-31 DIAGNOSIS — N1832 Chronic kidney disease, stage 3b: Secondary | ICD-10-CM | POA: Diagnosis not present

## 2023-10-31 DIAGNOSIS — R945 Abnormal results of liver function studies: Secondary | ICD-10-CM | POA: Diagnosis not present

## 2023-10-31 DIAGNOSIS — K7689 Other specified diseases of liver: Secondary | ICD-10-CM | POA: Diagnosis not present

## 2023-10-31 DIAGNOSIS — E875 Hyperkalemia: Secondary | ICD-10-CM | POA: Diagnosis not present

## 2023-10-31 NOTE — Progress Notes (Signed)
 [facility] Follow Up Visit   Patient Name: Maria Moss, female   Patient DOB: 09-01-1950 Date of Service: 10/31/2023  Patient MRN: [id number] Provider Creating Note: Woodward Brought, MD  [phone] Primary Care Physician: Dettinger, Fonda LABOR, MD   [address] [place] [address] Additional Physicians/ Providers:   Impression/Recommendations   [name] is a 73 y.o. female with papillary thyroid  cancer status post thyroidectomy, fibromyalgia, hyperlipidemia, CVA, anemia, thrombocytosis, and primary hyperparathyroidism who presents as a follow up patient with chronic kidney disease stage IIIB with hyperkalemia. Creatinine 1.45, GFR of 39. Potassium 4.8,    1. Chronic Kidney Disease stage IIIB: with hyperkalemia and proteinuria: with history of NSAIDs (meloxicam ) and hypertension. No history of diabetes. Lisinopril  gave hyperkalemia.              - Continue telmisartan 20mg  daily.             - continue to avoid meloxicam  and all other NSAIDs.  - not currently on a SGLT-2 inhibitor.   - avoid mineralocorticoid receptor antagonist due to history of hyperkalemia.   - no indication for a low potassium diet right now.    2. Hypertension with chronic kidney disease: elevated at 151/86. No edema on examination - Current regimen of telmisartan, carvedilol, amlodipine  and furosemide .              - home blood pressure monitoring.    3. Anemia with thrombocytosis:  followed by [facility]. Hemoglobin at goal, 12.7.    4. Primary hyperparathyroidism:  Calcium and phosphorus at goal. PTH 49.              - continue cholecalciferol  Patient Active Problem List  Diagnosis  . Chronic kidney disease stage 3B (HCC)  . Hyperkalemia  . Hypertensive chronic kidney disease, benign, with chronic kidney disease stage I through stage IV, or unspecified  . Primary hyperparathyroidism (HCC)  . Anemia in chronic kidney disease    Orders Placed This Encounter  .  PTH, Intact  . Renal Function Panel  . CBC and Differential  . Urinalysis, Complete w/reflex to Culture  . Protein, Total, Random Urine w/Creatinine (Protein/Creat Ratio)       Return in about 6 months (around 04/30/2024).  Chief Complaint   Chief Complaint  Patient presents with  . Follow-up    History of Present Illness   [name] presents for follow up. Patient presents by herself.   Patient states her home blood pressure readings show systolic 118-122.   She states her lower extremtiy swelling has improved.  Denies use of nonsteroidal anti-inflammatory agents.   Patient denies any recent hospitalizations.   Medications   Current Outpatient Medications:  .  cyanocobalamin  (VITAMIN B-12) 1000 MCG/ML injection, Inject 1,000 mcg into the shoulder, thigh, or buttocks every 30 (thirty) days, Disp: , Rfl:  .  acetaminophen  (TYLENOL ) 500 MG tablet, Take 500 mg by mouth if needed, Disp: , Rfl:  .  aluminum-magnesium hydroxide-simethicone  (MAALOX MAX) 400-400-40 MG/5ML suspension, Take by mouth if needed, Disp: , Rfl:  .  amLODIPine  (NORVASC ) 5 MG tablet, Take 5 mg by mouth 1 (one) time each day, Disp: , Rfl:  .  aspirin (ST JOSEPH) 81 MG EC tablet, Take 81 mg by mouth 1 (one) time each day, Disp: , Rfl:  .  carvedilol (Coreg) 6.25 MG tablet, Take 1 tablet (6.25 mg total) by mouth in the morning and 1  tablet (6.25 mg total) in the evening. Take with meals., Disp: 60 tablet, Rfl: 11 .  cetirizine (ZyrTEC) 10 MG tablet, Take 10 mg by mouth 1 (one) time each day, Disp: , Rfl:  .  cholecalciferol (VITAMIN D -3) 25 MCG (1000 UT) tablet, Take 4,000 Units by mouth 1 (one) time each day, Disp: , Rfl:  .  dicyclomine (BENTYL) 20 MG tablet, Take 20 mg by mouth every 6 (six) hours, Disp: , Rfl:  .  doxepin (SINEquan) 10 MG capsule, Take 10 mg by mouth every night, Disp: , Rfl:  .  DULoxetine  (CYMBALTA ) 60 MG DR capsule, Take 1 capsule by mouth 1 (one) time each day, Disp: , Rfl:  .   famotidine  (PEPCID ) 40 MG tablet, 20 mg in the morning and 40 mg at night, Disp: , Rfl:  .  FIBER PO, Take by mouth if needed, Disp: , Rfl:  .  fluticasone  (FLONASE ) 50 MCG/ACT nasal spray, Administer 2 sprays into affected nostril(s) every morning, Disp: , Rfl:  .  furosemide  (LASIX ) 20 MG tablet, Take 1 tablet by mouth 1 (one) time each day, Disp: , Rfl:  .  loperamide  (IMODIUM  A-D) 2 MG tablet, Take 2 mg by mouth 3 (three) times a day if needed, Disp: , Rfl:  .  omega-3 (FISH OIL) 1000 MG capsule, Take 3,000 mg by mouth 1 (one) time each day, Disp: , Rfl:  .  ondansetron  (ZOFRAN ) 4 MG tablet, Take 4 mg by mouth every 8 (eight) hours if needed for nausea or vomiting, Disp: , Rfl:  .  pantoprazole  (PROTONIX ) 40 MG EC tablet, Take 40 mg by mouth 1 (one) time each day, Disp: , Rfl:  .  PROBIOTIC PRODUCT PO, Take 1 capsule by mouth at bed time, Disp: , Rfl:  .  telmisartan (Micardis) 20 MG tablet, Take 1 tablet (20 mg total) by mouth 1 (one) time each day, Disp: 30 tablet, Rfl: 11 .  traMADol  (ULTRAM ) 50 MG tablet, Take 50 mg by mouth every 8 (eight) hours if needed, Disp: , Rfl:    Allergies Doxycycline, Codeine, and Glycopyrrolate  History Past Medical History:  Diagnosis Date  . Anemia in chronic kidney disease   . Chronic kidney disease, Stage IV (severe) (HCC) 09/13/2022  . Fibromyalgia   . Hyperkalemia 09/13/2022  . Hyperlipidemia   . Hypertensive chronic kidney disease, benign, with chronic kidney disease stage I through stage IV, or unspecified 09/13/2022  . Ischemic stroke (HCC)   . Papillary thyroid  carcinoma (HCC)   . Primary hyperparathyroidism (HCC) 09/13/2022  . Thrombocytosis     Past Surgical History:  Procedure Laterality Date  . ADRENALECTOMY    . ANKLE SURGERY    . CHOLECYSTECTOMY    . PARATHYROIDECTOMY    . THYROID  LOBECTOMY     Family History  Problem Relation Age of Onset  . Dementia Mother   . Heart disease Mother   . Hypertension Mother   . Cancer  Mother   . Hypertension Sister   . Hypertension Brother   . Diabetes Son    Social History   Tobacco Use  . Smoking status: Never  . Smokeless tobacco: Never  Substance Use Topics  . Alcohol use: Not Currently     Physical Exam  Vitals BP 151/86 (BP Location: Left upper arm, Patient Position: Sitting)   Pulse 63   Temp 97.6 F   Wt 146 lb 9.6 oz (66.5 kg)   SpO2 98%   BMI 27.70 kg/m  Vitals reviewed. Constitutional: She is oriented to person, place, and time.  HEENT:  Right Ear: Hearing normal.  Left Ear: Hearing normal.  Nose: Nose normal. Mouth/Throat: Oropharynx is clear and moist.  Eyes: Conjunctivae are normal. Pupils are equal, round, and reactive to light.  Cardiovascular:  Normal rate, regular rhythm and intact distal pulses.           Pulmonary/Chest: Effort normal and breath sounds normal.  Abdominal: Soft. Bowel sounds are normal.  Neurological: She is alert and oriented to person, place, and time.  Skin: Skin is warm and dry.  Psychiatric: She has a normal mood and affect. Her behavior is normal. Judgment normal.     Laboratory Studies  Chemistry  Lab Units 10/21/23 1031 04/27/23 1206 01/21/23 0924 09/16/22 1106 09/13/22 1106 04/14/22 0453 04/13/22 0207  SODIUM mmol/L 138 136 140 137  --  138 135  POTASSIUM mmol/L 4.8 4.7 4.4 4.7  --  4.1 4.4  CHLORIDE mmol/L 98 96 99 98  --  106 100  CO2 mmol/L 25 25 25 24   --  23.6 23.8  ANION GAP mmol/L  --   --   --   --   --  8 11  MAGNESIUM mg/dL  --   --   --  1.8  --   --  2.2  CALCIUM mg/dL 9.8 9.6 9.3 9.8  --  8.2* 8.6  PHOSPHORUS mg/dL 4.1 3.8 3.9 3.5  --   --  2.9  ALK PHOS U/L  --   --   --   --   --   --  136*  PTH pg/mL 49 95* 56 45  --   --   --   GLUCOSE mg/dL 88 95 97 95  --  881 813*  ALBUMIN g/dL 4.5 4.5 4.4 4.1  4.6 10 mg/L  --  3.3*  BUN mg/dL 22 24 25  28*  --  22* 45*  CREATININE mg/dL 8.54* 8.67* 8.65* 8.54*  --  1.22* 2.62*        No lab exists for component: IRON SATURATION,  TRANSSATPER  CBC  Lab Units 10/21/23 1031 04/27/23 1206 01/21/23 0924 09/16/22 1106  WBC AUTO x10E3/uL 8.8 10.5 8.6 7.8  HEMOGLOBIN g/dL 87.2 87.1 87.6 88.2  HEMATOCRIT % 39.5 40.7 37.9 36.0  MCV fL 94 93 92 92  PLATELETS AUTO x10E3/uL 605* 567* 560* 536*    Urine  Lab Units 10/21/23 1031 04/27/23 1206 01/21/23 0924 09/16/22 1106 09/13/22 1106  COLOR U  Yellow Yellow Yellow  --   --   COLOR UA   --   --   --   --  Light Yellow  CLARITY UA   --   --   --   --  Clear  KETONES UA   --   --   --   --  Negative  PH UA   --   --   --   --  6.0  UROBILINOGEN UA mg/dL 0.2 0.2 0.2  --  0.2  PROTEIN UR  Negative Negative Negative  --   --   PROT/CREAT RATIO UR mg/g creat 294* 311* 132 197  --         No lab exists for component: CYCLOSPORITR     Woodward Brought, MD  [employer], [place]

## 2023-11-01 ENCOUNTER — Encounter (INDEPENDENT_AMBULATORY_CARE_PROVIDER_SITE_OTHER): Payer: Self-pay | Admitting: Gastroenterology

## 2023-11-01 ENCOUNTER — Ambulatory Visit (INDEPENDENT_AMBULATORY_CARE_PROVIDER_SITE_OTHER): Admitting: Gastroenterology

## 2023-11-01 VITALS — BP 135/69 | HR 85 | Temp 97.3°F | Ht 61.0 in | Wt 146.3 lb

## 2023-11-01 DIAGNOSIS — K5229 Other allergic and dietetic gastroenteritis and colitis: Secondary | ICD-10-CM

## 2023-11-01 DIAGNOSIS — R748 Abnormal levels of other serum enzymes: Secondary | ICD-10-CM | POA: Insufficient documentation

## 2023-11-01 DIAGNOSIS — K760 Fatty (change of) liver, not elsewhere classified: Secondary | ICD-10-CM | POA: Insufficient documentation

## 2023-11-01 DIAGNOSIS — K219 Gastro-esophageal reflux disease without esophagitis: Secondary | ICD-10-CM

## 2023-11-01 DIAGNOSIS — K222 Esophageal obstruction: Secondary | ICD-10-CM | POA: Diagnosis not present

## 2023-11-01 DIAGNOSIS — K582 Mixed irritable bowel syndrome: Secondary | ICD-10-CM | POA: Insufficient documentation

## 2023-11-01 DIAGNOSIS — K529 Noninfective gastroenteritis and colitis, unspecified: Secondary | ICD-10-CM | POA: Insufficient documentation

## 2023-11-01 NOTE — Patient Instructions (Signed)
 It was very nice to meet you today, as dicussed with will plan for the following :  1) Labs

## 2023-11-01 NOTE — Progress Notes (Signed)
 Han Vejar Faizan Cordon Gassett , M.D. Gastroenterology & Hepatology [facility] Gastroenterology [address] [place], [place] [address] Primary Care Physician: Dettinger, Fonda LABOR, MD [address] [place] [address]  Chief Complaint:  Colitis and elevated liver enzymes  History of Present Illness: Maria Moss is a 73 y.o. female with papillary thyroid  cancer status post thyroidectomy, fibromyalgia, hyperlipidemia, CVA, anemia, thrombocytosis, and primary hyperparathyroidism.  , CKD , Hiatal hernia s/p repair/Nissen fundoplication 2021 Who presents for evaluation of elevated liver enzymes  Patient reports that previously she was on lisinopril  and had elevation liver enzymes at that time.  She was started on ezetimibe  when blood work was checked and liver enzymes were found elevated.  Patient could not tolerate statin because of lower extremity cramps Patient had exposure to antibiotics with Macrobid  and Keflex  for UTI.  Patient also had diarrhea at that time The patient denies having any nausea, vomiting, fever, chills, hematochezia, melena, hematemesis, abdominal distention, jaundice, pruritus or weight loss.  Patient primary gastroenterologist is at [facility] colonoscopy was June 2023 for positive Cologuard showing pandiverticulosis with a bulbous IC valve, hemorrhoids and a small lipoma in the ascending colon. Biopsies of the bowel were completely normal.   Past Medical History: Past Medical History:  Diagnosis Date   Anemia    iron deficiency   Ankle fracture, left 2006   Diverticula, intestine    Mild case.   Fibromyalgia    Dr. Ethel Dx in [place] years ago  no meds    GERD (gastroesophageal reflux disease)    History of hiatal hernia    Hyperlipidemia    Hyperparathyroidism    Hypertension    Dx age 26.    Stroke [facility])    Questionable stoke when born or polio not sure pt. has right side weakness toes right foot turns outward and toes curled up some      Past Surgical History: Past Surgical History:  Procedure Laterality Date   ANKLE SURGERY Left    Broken.  Has screws and metal plate.   CHOLECYSTECTOMY     COLONOSCOPY     PARATHYROIDECTOMY Right 05/29/2019   Procedure: RIGHT INFERIOR PARATHYROIDECTOMY;  Surgeon: Eletha Boas, MD;  Location: [facility];  Service: General;  Laterality: Right;   ROBOTIC ADRENALECTOMY Right 05/31/2018   Procedure: XI ROBOTIC RIGHT ADRENALECTOMY;  Surgeon: Rubin Calamity, MD;  Location: [facility];  Service: General;  Laterality: Right;   THYROID  LOBECTOMY Right 05/29/2019   Procedure: RIGHT THYROID  LOBECTOMY;  Surgeon: Eletha Boas, MD;  Location: [facility];  Service: General;  Laterality: Right;   UPPER GASTROINTESTINAL ENDOSCOPY      Family History: Family History  Problem Relation Age of Onset   Heart disease Mother        CHF   Cancer Mother        Uterine   Osteoporosis Mother    COPD Sister    Asthma Sister    Migraines Sister    Breast cancer Neg Hx     Social History: Social History   Tobacco Use  Smoking Status Never  Smokeless Tobacco Never   Social History   Substance and Sexual Activity  Alcohol Use No   Social History   Substance and Sexual Activity  Drug Use No    Allergies: Allergies  Allergen Reactions   Codeine Nausea And Vomiting   Glycopyrrolate     constipation   Sulfa Antibiotics     Hair loss, yellowed skin   Doxycycline  Hyclate Itching    Medications: Current Outpatient Medications  Medication Sig Dispense Refill   acetaminophen  (TYLENOL ) 500 MG tablet Take by mouth.     alum & mag hydroxide-simeth (MAALOX PLUS) 400-400-40 MG/5ML suspension Take by mouth.     amLODipine  (NORVASC ) 5 MG tablet Take 1 tablet (5 mg total) by mouth daily. 90 tablet 3   aspirin EC 81 MG tablet Take 81 mg by mouth daily. Swallow whole.     carvedilol (COREG) 6.25 MG tablet Take by mouth. (Patient taking differently: Take by mouth 2 (two) times daily with a meal.)      cetirizine (ZYRTEC) 10 MG tablet Take 10 mg by mouth daily.     Cholecalciferol 25 MCG (1000 UT) tablet Take 3,000 Units by mouth daily.  (Patient taking differently: Take 3,000 Units by mouth daily. 4000/day)     cyanocobalamin  (VITAMIN B12) 1000 MCG/ML injection Inject 1 mL (1,000 mcg total) into the muscle every 30 (thirty) days. 6 mL 0   dicyclomine (BENTYL) 20 MG tablet Take 20 mg by mouth every 6 (six) hours.     doxepin (SINEQUAN) 10 MG capsule Take 10 mg by mouth at bedtime.     DULoxetine  (CYMBALTA ) 60 MG capsule Take 1 capsule (60 mg total) by mouth daily. 90 capsule 3   ezetimibe  (ZETIA ) 10 MG tablet Take 1 tablet (10 mg total) by mouth at bedtime. 90 tablet 3   famotidine  (PEPCID ) 40 MG tablet one half tablet (20 mg dose) every morning. (Patient taking differently: 20mg  in AM; 40 mg PM)     fluticasone  (FLONASE ) 50 MCG/ACT nasal spray USE 2 SPRAYS IN EACH NOSTRIL EVERY DAY 48 g 3   furosemide  (LASIX ) 20 MG tablet TAKE 1 TABLET EVERY DAY 90 tablet 3   loperamide  (IMODIUM  A-D) 2 MG tablet Take 1 tablet (2 mg total) by mouth 4 (four) times daily as needed for diarrhea or loose stools. 30 tablet 0   Omega-3 Fatty Acids (FISH OIL) 1000 MG CAPS Take 1,000 mg by mouth daily.  (Patient taking differently: Take 1,000 mg by mouth daily. 3 capsule q am)     ondansetron  (ZOFRAN ) 4 MG tablet Take 1 tablet (4 mg total) by mouth every 8 (eight) hours as needed for nausea or vomiting. 20 tablet 0   pantoprazole  (PROTONIX ) 40 MG tablet Take 1 tablet (40 mg total) by mouth daily. 90 tablet 3   Probiotic Product (PROBIOTIC & ACIDOPHILUS EX ST PO) Take 1 capsule by mouth at bedtime.      telmisartan (MICARDIS) 20 MG tablet Take 20 mg by mouth daily.     traMADol  (ULTRAM ) 50 MG tablet Take 1 tablet (50 mg total) by mouth every 8 (eight) hours as needed (mild pain). 90 tablet 2   traZODone  (DESYREL ) 100 MG tablet Take 1 tablet (100 mg total) by mouth at bedtime. 90 tablet 1   Bempedoic Acid  (NEXLETOL ) 180 MG  TABS Take 1 tablet (180 mg total) by mouth daily at 12 noon. (Patient not taking: Reported on 11/01/2023) 30 tablet 5   FIBER PO Take by mouth. (Patient not taking: Reported on 11/01/2023)     No current facility-administered medications for this visit.    Review of Systems: GENERAL: negative for malaise, night sweats HEENT: No changes in hearing or vision, no nose bleeds or other nasal problems. NECK: Negative for lumps, goiter, pain and significant neck swelling RESPIRATORY: Negative for cough, wheezing CARDIOVASCULAR: Negative for chest pain, leg swelling, palpitations, orthopnea GI: SEE HPI  MUSCULOSKELETAL: Negative for joint pain or swelling, back pain, and muscle pain. SKIN: Negative for lesions, rash HEMATOLOGY Negative for prolonged bleeding, bruising easily, and swollen nodes. ENDOCRINE: Negative for cold or heat intolerance, polyuria, polydipsia and goiter. NEURO: negative for tremor, gait imbalance, syncope and seizures. The remainder of the review of systems is noncontributory.   Physical Exam: BP 135/69   Pulse 85   Temp (!) 97.3 F (36.3 C)   Ht 5' 1 (1.549 m)   Wt 146 lb 4.8 oz (66.4 kg)   BMI 27.64 kg/m  GENERAL: The patient is AO x3, in no acute distress. HEENT: Head is normocephalic and atraumatic. EOMI are intact. Mouth is well hydrated and without lesions. NECK: Supple. No masses LUNGS: Clear to auscultation. No presence of rhonchi/wheezing/rales. Adequate chest expansion HEART: RRR, normal s1 and s2. ABDOMEN: Soft, nontender, no guarding, no peritoneal signs, and nondistended. BS +. No masses.  Imaging/Labs: as above     Latest Ref Rng & Units 10/12/2023   10:56 AM 09/02/2023   10:04 AM 02/14/2023   10:24 AM  CBC  WBC 3.4 - 10.8 x10E3/uL 5.9  6.8  7.7   Hemoglobin 11.1 - 15.9 g/dL 87.7  88.1  88.2   Hematocrit 34.0 - 46.6 % 36.5  35.6  34.5   Platelets 150 - 450 x10E3/uL 418  515  475    Lab Results  Component Value Date   IRON 63 09/02/2023    TIBC 282 09/02/2023   FERRITIN 233 (H) 09/02/2023     Liver enzymes workup 02/2023  Hepatitis B surface antigen core antibody negative.  Hepatitis B surface antibody negative Hep C negative HIV negative   I personally reviewed and interpreted the available labs, imaging and endoscopic files.  Ultrasound   IMPRESSION: Diffuse increased echogenicity of the hepatic parenchyma is a nonspecific indicator of hepatocellular dysfunction, most commonly steatosis.  CT 2024  Status post right adrenalectomy.   Diverticular change of the colon is noted.   Wall thickening in the distal transverse and descending colons suspicious for underlying colitis.   Fat containing infraumbilical hernia.   IMPRESSION:  1. Small hiatal hernia.  2. Mild distal esophageal stricture   Impression and Plan:  Given chronicity of elevation liver enzymes will obtain AMA and autoimmune serologies  Maria Moss is a 73 y.o. female with papillary thyroid  cancer status post thyroidectomy, fibromyalgia, hyperlipidemia, CVA, anemia, thrombocytosis, and primary hyperparathyroidism.  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 However, scores under 2.0 are generally considered low risk. On exam patient does not have signs of advanced chronic liver disease, no splenomegaly, ascites, spider angiomas, palmar eythema    Hep B, C and HIV checked February 2025 was negative  Fortunately liver enzymes have trended down nicely  since patient stopped taking ezetimibe   Given chronicity and no elevation of liver enzymes with AST and intermittently ALT will obtain autoimmune serologies and AMA.  Repeat CMP in 1 month  #Colitis #Esophageal stricture   Patient had  a colonoscopy in 2023 at [facility] but had an episode of CT finiding of colitis in 2024 treated with antibiotics.  Currently is asymptomatic without any blood in stool or diarrhea  Esophagogram with distal esophageal stricture which could be peptic stricture in setting of chronic GERD or due to underlying Nissen fundoplication   Continue the  Pepcid  and Pantoprazole  Advised patient to reconsider upper endoscopy with dilation and colonoscopy if she is symptomatic.  Patient may perform these with her primary gastroenterologist or here [place] if she choose to transfer care  All questions were answered.      Maria Brege Faizan Tamaka Sawin, MD Gastroenterology and Hepatology [facility] Gastroenterology   This chart has been completed using [facility] Dictation software, and while attempts have been made to ensure accuracy , certain words and phrases may not be transcribed as intended

## 2023-11-02 ENCOUNTER — Ambulatory Visit: Payer: Self-pay | Admitting: Family Medicine

## 2023-11-07 ENCOUNTER — Encounter: Payer: Self-pay | Admitting: Radiology

## 2023-11-10 ENCOUNTER — Other Ambulatory Visit

## 2023-11-10 DIAGNOSIS — K760 Fatty (change of) liver, not elsewhere classified: Secondary | ICD-10-CM | POA: Diagnosis not present

## 2023-11-10 DIAGNOSIS — R748 Abnormal levels of other serum enzymes: Secondary | ICD-10-CM | POA: Diagnosis not present

## 2023-11-14 LAB — COMPREHENSIVE METABOLIC PANEL WITH GFR
ALT: 18 IU/L (ref 0–32)
AST: 17 IU/L (ref 0–40)
Albumin: 4.5 g/dL (ref 3.8–4.8)
Alkaline Phosphatase: 133 IU/L (ref 49–135)
BUN/Creatinine Ratio: 14 (ref 12–28)
BUN: 19 mg/dL (ref 8–27)
Bilirubin Total: 0.4 mg/dL (ref 0.0–1.2)
CO2: 24 mmol/L (ref 20–29)
Calcium: 9.5 mg/dL (ref 8.7–10.3)
Chloride: 99 mmol/L (ref 96–106)
Creatinine, Ser: 1.39 mg/dL — ABNORMAL HIGH (ref 0.57–1.00)
Globulin, Total: 2.1 g/dL (ref 1.5–4.5)
Glucose: 89 mg/dL (ref 70–99)
Potassium: 4.9 mmol/L (ref 3.5–5.2)
Sodium: 138 mmol/L (ref 134–144)
Total Protein: 6.6 g/dL (ref 6.0–8.5)
eGFR: 41 mL/min/1.73 — ABNORMAL LOW (ref >59)

## 2023-11-14 LAB — ANA: ANA Titer 1: NEGATIVE

## 2023-11-14 LAB — PROTIME-INR
INR: 0.9 (ref 0.9–1.2)
Prothrombin Time: 10.1 s (ref 9.1–12.0)

## 2023-11-14 LAB — ANTI-SMOOTH MUSCLE ANTIBODY, IGG: Smooth Muscle Ab: 10 U (ref 0–19)

## 2023-11-14 LAB — IRON,TIBC AND FERRITIN PANEL
Ferritin: 548 ng/mL — ABNORMAL HIGH (ref 15–150)
Iron Saturation: 34 % (ref 15–55)
Iron: 84 ug/dL (ref 27–139)
Total Iron Binding Capacity: 244 ug/dL — ABNORMAL LOW (ref 250–450)
UIBC: 160 ug/dL (ref 118–369)

## 2023-11-14 LAB — MITOCHONDRIAL ANTIBODIES: Mitochondrial Ab: <20 U (ref 0.0–20.0)

## 2023-11-16 ENCOUNTER — Ambulatory Visit (INDEPENDENT_AMBULATORY_CARE_PROVIDER_SITE_OTHER): Payer: Self-pay | Admitting: Gastroenterology

## 2023-11-16 NOTE — Progress Notes (Signed)
 Hi Maria Moss ,  Can you please call the patient and tell the patient the lab work shows that your liver enzymes have normalized now which is a good news.  Also lab work is negative for any autoimmune hepatitis  Thanks,  Djon Tith Faizan Correll Denbow, MD Gastroenterology and Hepatology [facility] Gastroenterology ==============  Labs  Ferritin 548 (elevated) iron saturation 34  Creatinine 1.39 which is baseline  Liver enzymes have normalized which were previously elevated (ALT 18 AST 17 alk phos 133)  Negative ANA, AMA, ASMA  INR 0.9

## 2023-11-17 ENCOUNTER — Encounter (INDEPENDENT_AMBULATORY_CARE_PROVIDER_SITE_OTHER): Payer: Self-pay

## 2023-11-25 ENCOUNTER — Ambulatory Visit: Payer: Self-pay | Admitting: *Deleted

## 2023-11-25 ENCOUNTER — Ambulatory Visit (INDEPENDENT_AMBULATORY_CARE_PROVIDER_SITE_OTHER): Admitting: Nurse Practitioner

## 2023-11-25 ENCOUNTER — Ambulatory Visit: Payer: Self-pay | Admitting: Nurse Practitioner

## 2023-11-25 ENCOUNTER — Encounter: Payer: Self-pay | Admitting: Nurse Practitioner

## 2023-11-25 VITALS — BP 140/85 | HR 107 | Temp 98.1°F | Ht 61.0 in | Wt 146.0 lb

## 2023-11-25 DIAGNOSIS — Z23 Encounter for immunization: Secondary | ICD-10-CM | POA: Diagnosis not present

## 2023-11-25 DIAGNOSIS — N3001 Acute cystitis with hematuria: Secondary | ICD-10-CM

## 2023-11-25 DIAGNOSIS — R300 Dysuria: Secondary | ICD-10-CM

## 2023-11-25 LAB — URINALYSIS, ROUTINE W REFLEX MICROSCOPIC
Bilirubin, UA: NEGATIVE
Glucose, UA: NEGATIVE
Ketones, UA: NEGATIVE
Nitrite, UA: NEGATIVE
Specific Gravity, UA: 1.015 (ref 1.005–1.030)
Urobilinogen, Ur: 0.2 mg/dL (ref 0.2–1.0)
pH, UA: 7 (ref 5.0–7.5)

## 2023-11-25 LAB — MICROSCOPIC EXAMINATION
Renal Epithel, UA: NONE SEEN /HPF
WBC, UA: >30 /HPF — AB (ref 0–5)

## 2023-11-25 MED ORDER — CEPHALEXIN 500 MG PO CAPS: 500.0000 mg | ORAL_CAPSULE | Freq: Two times a day (BID) | ORAL | 0 refills | Status: DC

## 2023-11-25 NOTE — Telephone Encounter (Signed)
 Appt made.

## 2023-11-25 NOTE — Telephone Encounter (Signed)
 FYI Only or Action Required?: FYI only for provider: appointment scheduled on 11/21.  Patient was last seen in primary care on 10/12/2023 by Maria Morton Sebastian Nena, NP.  Called Nurse Triage reporting Hematuria.  Symptoms began yesterday.  Interventions attempted: Nothing.  Symptoms are: unchanged.  Triage Disposition: See Physician Within 24 Hours  Patient/caregiver understands and will follow disposition?: Yes  Copied from CRM #[id number]. Topic: Clinical - Red Word Triage >> Nov 25, 2023  8:03 AM Ivette P wrote: [facility] that prompted transfer to Nurse Triage: Blood on urine, would like to be seen to be checked Reason for Disposition  Side (flank) or back pain present  Answer Assessment - Initial Assessment Questions 1. COLOR of URINE: Describe the color of the urine.  (e.g., tea-colored, pink, red, bloody) Do you have blood clots in your urine? (e.g., none, pea, grape, small coin)     Urine is red 2. ONSET: When did the bleeding start?      yesterday 3. EPISODES: How many times has there been blood in the urine? or How many times today?     Every time urinated 4. PAIN with URINATION: Is there any pain with passing your urine? If Yes, ask: How bad is the pain?  (Scale 1-10; or mild, moderate, severe)     No- some pain in L side and lower pelvic 5. FEVER: Do you have a fever? If Yes, ask: What is your temperature, how was it measured, and when did it start?     no 6. ASSOCIATED SYMPTOMS: Are you passing urine more frequently than usual?     Frequency, urgency  Protocols used: Urine - Blood In-A-AH

## 2023-11-25 NOTE — Progress Notes (Signed)
 Subjective:    Patient ID: Maria MARLA Free, female    DOB: March 25, 1950, 73 y.o.   MRN: [id number]   Chief Complaint: Hematuria   Hematuria Irritative symptoms include frequency and urgency. Pertinent negatives include no chills, dysuria or fever.    Patient in stating she had blood in her urine yesterday. Was a lot yesterday and only slight today. No dysuria but doe shave frequency and urgency. Patient Active Problem List   Diagnosis Date Noted   Elevated liver enzymes 11/01/2023   Metabolic dysfunction-associated steatotic liver disease (MASLD) 11/01/2023   Noninfectious gastroenteritis 11/01/2023   Irritable bowel syndrome with both constipation and diarrhea 11/01/2023   Flu-like symptoms 10/12/2023   Diarrhea 10/12/2023   History of colitis 10/12/2023   Dysuria 10/12/2023   Acute cystitis without hematuria 10/12/2023   IDA (iron deficiency anemia) 09/22/2023   B12 deficiency 09/22/2023   H/O papillary adenocarcinoma of thyroid  01/23/2021   Hyperparathyroidism, primary 05/29/2019   Right thyroid  nodule 05/29/2019   Thrombocytosis 11/23/2018   Anemia of chronic disease 10/13/2018   Vitamin D  deficiency 07/21/2018   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  Genitourinary:  Positive for frequency, hematuria and urgency. Negative for dysuria.       Objective:   Physical Exam Constitutional:      Appearance: Normal appearance.  Cardiovascular:     Rate and Rhythm: Normal rate and regular rhythm.      Heart sounds: Normal heart sounds.  Pulmonary:     Breath sounds: Normal breath sounds.  Abdominal:     General: Bowel sounds are normal.     Tenderness: There is no abdominal tenderness. There is left CVA tenderness. There is no right CVA tenderness.  Skin:    General: Skin is warm.  Neurological:     General: No focal deficit present.     Mental Status: She is alert and oriented to person, place, and time.  Psychiatric:        Mood and Affect: Mood normal.        Behavior: Behavior normal.    BP (!) 140/85   Pulse (!) 107   Temp 98.1 F (36.7 C) (Temporal)   Ht 5' 1 (1.549 m)   Wt 146 lb (66.2 kg)   SpO2 94%   BMI 27.59 kg/m         Assessment & Plan:  Maria Moss in today with chief complaint of Hematuria   1. Dysuria (Primary) - Urinalysis, Routine w reflex microscopic - Urine Culture  2. Acute cystitis with hematuria Take medication as prescribe Cotton underwear Take shower not bath Cranberry juice, yogurt Force fluids AZO over the counter X2 days Culture pending RTO prn  Meds ordered this encounter  Medications   cephALEXin  (KEFLEX ) 500 MG capsule    Sig: Take 1 capsule (500 mg total) by mouth 2 (two) times daily.    Dispense:  14 capsule    Refill:  0    Supervising Provider:   MARYANNE CHEW A [id number]  The above assessment and management plan was discussed with the patient. The patient verbalized understanding of and has agreed to the management plan. Patient is aware to call the clinic if symptoms persist or worsen. Patient is aware when to return to the clinic for a follow-up visit. Patient educated on when it is appropriate to go to the emergency department.   Maria Gladis, FNP

## 2023-11-25 NOTE — Patient Instructions (Signed)
 Take medication as prescribe Cotton underwear Take shower not bath Cranberry juice, yogurt Force fluids AZO over the counter X2 days Culture pending RTO prn

## 2023-11-29 LAB — URINE CULTURE

## 2023-11-30 ENCOUNTER — Ambulatory Visit: Payer: Self-pay

## 2023-11-30 ENCOUNTER — Telehealth: Payer: Self-pay

## 2023-11-30 ENCOUNTER — Other Ambulatory Visit: Payer: Self-pay

## 2023-11-30 MED ORDER — AMOXICILLIN-POT CLAVULANATE 875-125 MG PO TABS: 1.0000 | ORAL_TABLET | Freq: Two times a day (BID) | ORAL | 0 refills | Status: DC

## 2023-11-30 MED ORDER — FLUCONAZOLE 150 MG PO TABS: 150.0000 mg | ORAL_TABLET | Freq: Once | ORAL | 0 refills | Status: AC

## 2023-11-30 MED ORDER — FLUCONAZOLE 150 MG PO TABS: 150.0000 mg | ORAL_TABLET | Freq: Once | ORAL | 0 refills | Status: DC

## 2023-11-30 NOTE — Telephone Encounter (Signed)
 FYI Only or Action Required?: Action required by provider: medication refill request and clinical question for provider. Pt will onset of rash on legs, shoulder and hands after starting keflex  for UTI on Friday. No angioedema or SOB. Please advise. Pt also asking to have rx for yeast infection prescribed. Reports she usually gets yeast infections after abx and reports recent onset of mild vaginal itching. Pt can be reached by phone or mychart. Pharmacy: [facility] [place], [place] - [address] .   Patient was last seen in primary care on 11/25/2023 by Gladis Mustard, FNP.  Called Nurse Triage reporting Dysuria and Rash.  Symptoms began several days ago.  Interventions attempted: OTC medications: Hydrocortisone cream and Prescription medications: Keflex .  Symptoms are: gradually worsening.  Triage Disposition: Callback by PCP Today (overriding See Physician Within 24 Hours)  Patient/caregiver understands and will follow disposition?: Yes  Copied from CRM #[id number]. Topic: Clinical - Red Word Triage >> Nov 30, 2023 10:57 AM Harlene ORN wrote: Red Word that prompted transfer to Nurse Triage:  had a bad UTI. Has been taking cephALEXin  (KEFLEX ) 500 MG capsule since last Friday, and has a few days left. Now has red spots popping up. Reason for Disposition  Taking new prescription antibiotic  (Exception: Finished taking new prescription antibiotic.)    Per RN judgement: call by PCP today.  Answer Assessment - Initial Assessment Questions Pt saw PCP on Friday for UTI symptoms. Started on Keflex  BID for 7 days, has 2 days left currently. Reports onset of red spotty rash on legs, shoulders and hands. Denies SOB, CP or angioedema. Mildly itchy. Advised to stop the abx. Sending message to office with further instructions if pt should finish the last 2 days of abx or switch to a different one. Pt also asking to have something for yeast infection sent to her pharmacy as she normally gets a  yeast infection after taking abxs. Reports recent onset of mild vaginal itching, no other symptoms.   1. APPEARANCE of RASH: What does the rash look like? (e.g., spots, blisters, raised areas, skin peeling, scaly)     Little red spots, dry  2. SIZE: How big are the spots? (e.g., tip of pen, eraser, coin; inches, centimeters)     Dime sized  3. LOCATION: Where is the rash located?     Legs and shoulders and hands  4. COLOR: What color is the rash? (Note: It is difficult to assess rash color in people with darker-colored skin. When this situation occurs, simply ask the caller to describe what they see.)     Red  5. ONSET: When did the rash begin?     1-2 days after starting Keflex   6. FEVER: Do you have a fever? If Yes, ask: What is your temperature, how was it measured, and when did it start?     Denies  7. ITCHING: Does the rash itch? If Yes, ask: How bad is the itch? (Scale 1-10; or mild, moderate, severe)     Occasional mild itch  8. CAUSE: What do you think is causing the rash?     Maybe d/t starting new abx Keflex   9. NEW MEDICINES: What new medicines are you taking? (e.g., name of antibiotic) When did you start taking this medication?.     Keflex , started on 11/21 for UTI. Has 2 more days left.  10. OTHER SYMPTOMS: Do you have any other symptoms? (e.g., sore throat, fever, joint pain)  Reports UTI is improving since starting abx. Mild vaginal itching.  Protocols used: Rash - Widespread On Drugs-A-AH

## 2023-11-30 NOTE — Telephone Encounter (Signed)
 Please send Augmentin  875 twice daily for 7 days for her and send her 1 Diflucan  150 x 1

## 2023-11-30 NOTE — Telephone Encounter (Signed)
 Copied from CRM [id number]. Topic: Clinical - Prescription Issue >> Nov 30, 2023  5:15 PM Maria Moss wrote: Reason for CRM: Patient is calling in because her medication amoxicillin -clavulanate (AUGMENTIN ) 875-125 MG tablet [[id number]] is showing it has been sent to the pharmacy, but the pharmacy doesn't have it.

## 2023-11-30 NOTE — Telephone Encounter (Signed)
Medications sent. Pt aware  

## 2023-11-30 NOTE — Telephone Encounter (Signed)
 Resent medication and called pharmacy to verify they received it

## 2023-12-09 ENCOUNTER — Ambulatory Visit: Payer: Self-pay | Admitting: Family Medicine

## 2023-12-09 ENCOUNTER — Encounter: Payer: Self-pay | Admitting: Family Medicine

## 2023-12-09 VITALS — BP 146/73 | HR 65 | Ht 61.0 in | Wt 151.0 lb

## 2023-12-09 DIAGNOSIS — N390 Urinary tract infection, site not specified: Secondary | ICD-10-CM | POA: Diagnosis not present

## 2023-12-09 DIAGNOSIS — E782 Mixed hyperlipidemia: Secondary | ICD-10-CM

## 2023-12-09 DIAGNOSIS — M5136 Other intervertebral disc degeneration, lumbar region with discogenic back pain only: Secondary | ICD-10-CM

## 2023-12-09 DIAGNOSIS — I10 Essential (primary) hypertension: Secondary | ICD-10-CM

## 2023-12-09 LAB — URINALYSIS, COMPLETE
Bilirubin, UA: NEGATIVE
Glucose, UA: NEGATIVE
Ketones, UA: NEGATIVE
Nitrite, UA: NEGATIVE
Protein,UA: NEGATIVE
RBC, UA: NEGATIVE
Specific Gravity, UA: 1.01 (ref 1.005–1.030)
Urobilinogen, Ur: 0.2 mg/dL (ref 0.2–1.0)
pH, UA: 6.5 (ref 5.0–7.5)

## 2023-12-09 LAB — MICROSCOPIC EXAMINATION
Bacteria, UA: NONE SEEN
RBC, Urine: NONE SEEN /HPF (ref 0–2)
Renal Epithel, UA: NONE SEEN /HPF
Yeast, UA: NONE SEEN

## 2023-12-09 MED ORDER — TRAMADOL HCL 50 MG PO TABS: 50.0000 mg | ORAL_TABLET | Freq: Three times a day (TID) | ORAL | 2 refills | Status: AC | PRN

## 2023-12-09 NOTE — Progress Notes (Signed)
 BP (!) 146/73   Pulse 65   Ht 5' 1 (1.549 m)   Wt 151 lb (68.5 kg)   SpO2 98%   BMI 28.53 kg/m    Subjective:   Patient ID: Maria Moss, female    DOB: [birth year], 73 y.o.   MRN: [id number]  HPI: Maria Moss is a 73 y.o. female presenting on 12/09/2023 for Medical Management of Chronic Issues, Hyperlipidemia, and Hypertension   Discussed the use of AI scribe software for clinical note transcription with the patient, who gave verbal consent to proceed.  History of Present Illness   Maria Moss is a 73 year old female with hypertension, hyperlipidemia, and degenerative disc disease who presents for a recheck and medication refill.  Chronic back pain - Degenerative disc disease with chronic back pain. - Currently taking tramadol , which provides some relief. - Received injections on each side of her back, resulting in partial pain relief. - Continues to take Cymbalta ; uncertain if it is effective for pain control.  Medication concerns - Received a letter regarding a medication starting with 'FEM'; unclear about the specific medication and its indication. - Advised to discuss possible medication change.          Relevant past medical, surgical, family and social history reviewed and updated as indicated. Interim medical history since our last visit reviewed. Allergies and medications reviewed and updated.  Review of Systems  Constitutional:  Negative for chills and fever.  Eyes:  Negative for visual disturbance.  Respiratory:  Negative for chest tightness and shortness of breath.   Cardiovascular:  Negative for chest pain and leg swelling.  Genitourinary:  Negative for difficulty urinating and dysuria.  Musculoskeletal:  Positive for arthralgias and back pain. Negative for gait problem.  Skin:  Negative for rash.  Neurological:  Negative for dizziness, light-headedness and headaches.  Psychiatric/Behavioral:  Negative for agitation and behavioral problems.   All other systems  reviewed and are negative.   Per HPI unless specifically indicated above   Allergies as of 12/09/2023       Reactions   Codeine Nausea And Vomiting   Glycopyrrolate    constipation   Sulfa Antibiotics    Hair loss, yellowed skin   Doxycycline Hyclate Itching        Medication List        Accurate as of December 09, 2023 11:21 AM. If you have any questions, ask your nurse or doctor.          STOP taking these medications    amoxicillin -clavulanate 875-125 MG tablet Commonly known as: AUGMENTIN  Stopped by: [facility] Damisha Wolff       TAKE these medications    acetaminophen  500 MG tablet Commonly known as: TYLENOL  Take by mouth.   alum & mag hydroxide-simeth 400-400-40 MG/5ML suspension Commonly known as: MAALOX PLUS Take by mouth.   amLODipine  5 MG tablet Commonly known as: NORVASC  Take 1 tablet (5 mg total) by mouth daily.   aspirin EC 81 MG tablet Take 81 mg by mouth daily. Swallow whole.   carvedilol 6.25 MG tablet Commonly known as: COREG Take by mouth. What changed: when to take this   cetirizine 10 MG tablet Commonly known as: ZYRTEC Take 10 mg by mouth daily.   Cholecalciferol 25 MCG (1000 UT) tablet Take 3,000 Units by mouth daily. What changed: additional instructions   cyanocobalamin  1000 MCG/ML injection Commonly known as: VITAMIN B12 Inject 1 mL (1,000 mcg total) into the muscle every 30 (thirty)  days.   dicyclomine 20 MG tablet Commonly known as: BENTYL Take 20 mg by mouth every 6 (six) hours.   doxepin 10 MG capsule Commonly known as: SINEQUAN Take 10 mg by mouth at bedtime.   DULoxetine  60 MG capsule Commonly known as: CYMBALTA  Take 1 capsule (60 mg total) by mouth daily.   ezetimibe  10 MG tablet Commonly known as: ZETIA  Take 1 tablet (10 mg total) by mouth at bedtime.   famotidine  40 MG tablet Commonly known as: PEPCID  one half tablet (20 mg dose) every morning. What changed: See the new instructions.   FIBER  PO Take by mouth.   Fish Oil 1000 MG Caps Take 1,000 mg by mouth daily. What changed: additional instructions   fluticasone  50 MCG/ACT nasal spray Commonly known as: FLONASE  USE 2 SPRAYS IN EACH NOSTRIL EVERY DAY   furosemide  20 MG tablet Commonly known as: LASIX  TAKE 1 TABLET EVERY DAY   loperamide  2 MG tablet Commonly known as: Imodium  A-D Take 1 tablet (2 mg total) by mouth 4 (four) times daily as needed for diarrhea or loose stools.   Nexletol  180 MG Tabs Generic drug: Bempedoic Acid  Take 1 tablet (180 mg total) by mouth daily at 12 noon.   ondansetron  4 MG tablet Commonly known as: Zofran  Take 1 tablet (4 mg total) by mouth every 8 (eight) hours as needed for nausea or vomiting.   pantoprazole  40 MG tablet Commonly known as: PROTONIX  Take 1 tablet (40 mg total) by mouth daily.   PROBIOTIC & ACIDOPHILUS EX ST PO Take 1 capsule by mouth at bedtime.   telmisartan 20 MG tablet Commonly known as: MICARDIS Take 20 mg by mouth daily.   traMADol  50 MG tablet Commonly known as: ULTRAM  Take 1 tablet (50 mg total) by mouth every 8 (eight) hours as needed (mild pain).   traZODone  100 MG tablet Commonly known as: DESYREL  Take 1 tablet (100 mg total) by mouth at bedtime.         Objective:   BP (!) 146/73   Pulse 65   Ht 5' 1 (1.549 m)   Wt 151 lb (68.5 kg)   SpO2 98%   BMI 28.53 kg/m   Wt Readings from Last 3 Encounters:  12/09/23 151 lb (68.5 kg)  11/25/23 146 lb (66.2 kg)  11/01/23 146 lb 4.8 oz (66.4 kg)    Physical Exam Physical Exam   NECK: Thyroid  without nodules or masses. CHEST: Lungs clear to auscultation bilaterally. CARDIOVASCULAR: Heart regular rate and rhythm, no murmurs. EXTREMITIES: Peripheral pulses intact, good circulation. Mild swelling on right extremity.         Assessment & Plan:   Problem List Items Addressed This Visit       Cardiovascular and Mediastinum   Hypertension (Chronic)     Musculoskeletal and Integument    Degenerative disc disease, lumbar (Chronic)   Relevant Medications   traMADol  (ULTRAM ) 50 MG tablet   Other Relevant Orders   ToxASSURE Select 13 (MW), Urine     Other   Hyperlipemia - Primary (Chronic)   Other Visit Diagnoses       Recurrent UTI       Relevant Orders   Urinalysis, Complete   Urine Culture          Essential hypertension - Continue current antihypertensive regimen.  Mixed hyperlipidemia - Continue current lipid-lowering therapy.  Lumbar degenerative disc disease with chronic discogenic back pain Chronic lumbar degenerative disc disease with discogenic back pain. Tramadol  provides some  relief. Recent injections beneficial. Cymbalta  efficacy uncertain. - Continue tramadol  as needed for pain. - Continue Cymbalta  for pain management. - Performed urine drug screening. - Reviewed [place]  Drug Database for prescription compliance.  Right lower extremity swelling Mild swelling with good pulses and circulation. - Ordered urine test to check for kidney issues.          Follow up plan: Return in about 3 months (around 03/08/2024), or if symptoms worsen or fail to improve, for Hypertension and hyperlipidemia and degenerative disc disease recheck.  Counseling provided for all of the vaccine components Orders Placed This Encounter  Procedures   Urine Culture   ToxASSURE Select 13 (MW), Urine   Urinalysis, Complete    Fonda Levins, MD [facility] 12/09/2023, 11:21 AM

## 2023-12-11 LAB — URINE CULTURE

## 2023-12-12 ENCOUNTER — Ambulatory Visit: Payer: Self-pay | Admitting: Family Medicine

## 2023-12-14 LAB — TOXASSURE SELECT 13 (MW), URINE

## 2023-12-15 ENCOUNTER — Telehealth: Payer: Self-pay | Admitting: Family Medicine

## 2023-12-15 NOTE — Telephone Encounter (Unsigned)
 Copied from CRM #[id number]. Topic: General - Call Back - No Documentation >> Dec 15, 2023  1:47 PM Avram MATSU wrote: Reason for CRM: [facility]Call[facility] from [facility] stating a medication request form was faxed on the 20th of November and wanted to follow up [phone]

## 2023-12-16 ENCOUNTER — Other Ambulatory Visit (HOSPITAL_COMMUNITY): Payer: Self-pay

## 2023-12-16 NOTE — Telephone Encounter (Signed)
 Fax in regards to requesting response in adding additional medication. Most of the time our providers do not respond they discuss things like this at visits. Did ask to re-fax d/t the requested medication.

## 2023-12-26 ENCOUNTER — Encounter: Payer: Self-pay | Admitting: Family Medicine

## 2023-12-26 ENCOUNTER — Ambulatory Visit: Admitting: Family Medicine

## 2023-12-26 VITALS — BP 120/63 | HR 78 | Temp 98.0°F | Ht 61.0 in | Wt 150.0 lb

## 2023-12-26 DIAGNOSIS — L309 Dermatitis, unspecified: Secondary | ICD-10-CM

## 2023-12-26 NOTE — Progress Notes (Signed)
 "  Subjective:  Patient ID: Maria Moss, female    DOB: Mar 16, 1950  Age: 73 y.o. MRN: [id number]  CC: skin (First noticed after taking cipro  which she is not longer taking but new spots popping up. Very itching.  No drainage. Spots come and go. )   HPI  Discussed the use of AI scribe software for clinical note transcription with the patient, who gave verbal consent to proceed.  History of Present Illness Maria Moss is a 73 year old female who presents with skin spots following antibiotic use.  She developed skin spots after starting ciprofloxacin  for a urinary tract infection approximately one to two months ago. She did not complete the course of ciprofloxacin  and was switched to amoxicillin , but the spots have persisted.  The spots appear, darken, and then fade over a few days. They are located on her legs, neck, and between her fingers. Initially, they start as a pink bump and become red. Occasionally, the spots on her neck appear green. No spots are present on her palms or belly.  She is concerned about the spots worsening and causing itching, especially during the holiday season. She has not been diagnosed with diabetes and is concerned about the impact of treatments on her kidneys.          12/09/2023   11:07 AM 11/25/2023    9:36 AM 10/12/2023   10:29 AM  Depression screen PHQ 2/9  Decreased Interest 0 0 0  Down, Depressed, Hopeless 0 0 0  PHQ - 2 Score 0 0 0    History Maria Moss has a past medical history of Anemia, Ankle fracture, left (2006), Diverticula, intestine, Fibromyalgia, GERD (gastroesophageal reflux disease), History of hiatal hernia, Hyperlipidemia, Hyperparathyroidism, Hypertension, and Stroke (HCC).   She has a past surgical history that includes Cholecystectomy; Ankle surgery (Left); Robotic adrenalectomy (Right, 05/31/2018); Parathyroidectomy (Right, 05/29/2019); Thyroid  lobectomy (Right, 05/29/2019); Colonoscopy; and Upper gastrointestinal endoscopy.   Her family  history includes Asthma in her sister; COPD in her sister; Cancer in her mother; Heart disease in her mother; Migraines in her sister; Osteoporosis in her mother.She reports that she has never smoked. She has never used smokeless tobacco. She reports that she does not drink alcohol and does not use drugs.    ROS Review of Systems  Objective:  BP 120/63   Pulse 78   Temp 98 F (36.7 C)   Ht 5' 1 (1.549 m)   Wt 150 lb (68 kg)   SpO2 98%   BMI 28.34 kg/m   BP Readings from Last 3 Encounters:  12/26/23 120/63  12/09/23 (!) 146/73  11/25/23 (!) 140/85    Wt Readings from Last 3 Encounters:  12/26/23 150 lb (68 kg)  12/09/23 151 lb (68.5 kg)  11/25/23 146 lb (66.2 kg)     Physical Exam Skin:    Findings: Rash (vague, blanching erythematous patches scattered over extremities) present.    Physical Exam GENERAL: Alert, cooperative, well developed, no acute distress HEENT: Normocephalic, normal oropharynx, moist mucous membranes CHEST: Clear to auscultation bilaterally, no wheezes, rhonchi, or crackles CARDIOVASCULAR: Normal heart rate and rhythm, S1 and S2 normal without murmurs ABDOMEN: Soft, non-tender, non-distended, without organomegaly, normal bowel sounds EXTREMITIES: No cyanosis or edema NEUROLOGICAL: Cranial nerves grossly intact, moves all extremities without gross motor or sensory deficit SKIN: Multiple spots on skin, some dark, some pink, dry texture, located on neck, legs, between fingers   Assessment & Plan:  Eczema, unspecified type    Assessment  and Plan Assessment & Plan Drug-induced skin eruption   Likely secondary to ciprofloxacin , presenting as red, dry, and intermittently dark spots on legs, neck, and between fingers. Symptoms have persisted for one to two months. Differential diagnosis includes eczema, but the timing with antibiotic use suggests a drug reaction. The eruption is not severe enough to avoid future antibiotic use if necessary.  Administered cortisone injection to alleviate symptoms.       Follow-up: Return if symptoms worsen or fail to improve.  Butler Der, M.D. "

## 2024-01-16 ENCOUNTER — Other Ambulatory Visit: Payer: Self-pay | Admitting: *Deleted

## 2024-01-16 DIAGNOSIS — I10 Essential (primary) hypertension: Secondary | ICD-10-CM

## 2024-02-08 ENCOUNTER — Ambulatory Visit: Payer: Self-pay

## 2024-02-08 NOTE — Telephone Encounter (Signed)
 FYI Only or Action Required?: FYI only for provider: appointment scheduled on 02/09/2024 at 10:15am with Annabella Search FNP .  Patient was last seen in primary care on 12/26/2023 by Zollie Lowers, MD.  Called Nurse Triage reporting Nasal Congestion.  Symptoms began over ten days ago for congestion symptoms and over two months ago for off and on rash.  Interventions attempted: OTC medications: Tussin, Flonase , dayquil type medicine and Rest, hydration, or home remedies.  Symptoms are: gradually worsening.  Triage Disposition: See PCP When Office is Open (Within 3 Days)  Patient/caregiver understands and will follow disposition?: Yes         Copied from CRM [id number]. Topic: Clinical - Red Word Triage >> Feb 08, 2024 11:33 AM Victoria B wrote: Patient has a rash here and there on her body and a bad cold Reason for Disposition  [1] Sinus congestion (pressure, fullness) AND [2] present > 10 days  Answer Assessment - Initial Assessment Questions Sneezing, runny nose (mostly clear) Nasal and chest congestion, sneezing  Tussin cough medicine, dayquil type of medication, Flonase   Patient thought she was allergic to Cipro --patient came in and had a cortisone injection---seen on 12/26/2023 She states some places got better but now some more places started coming up in different places ---places slightly look like they might be getting slightly better Patient states slightly irritated throat   Patient states that her husband had recently been sick and had an upper respiratory infection  Patient denies any known fevers, chest pain, difficulty breathing, nausea, vomiting  Patient was offered an appointment with another provider at a surrounding PCP office within the region Patient declined this and wanted to wait until tomorrow to be seen at her PCP office Appointment made for 02/09/2024 to see Annabella Search FNP at PCP office at 10:15am Patient is advised to call us  back if anything  changes or with any further questions/concerns. Patient is advised that if anything worsens to go to the Emergency Room. Patient verbalized understanding.  Protocols used: Sinus Pain or Congestion-A-AH

## 2024-02-09 ENCOUNTER — Encounter: Payer: Self-pay | Admitting: Family Medicine

## 2024-02-09 ENCOUNTER — Ambulatory Visit: Admitting: Family Medicine

## 2024-02-09 VITALS — BP 125/82 | HR 96 | Temp 98.3°F | Ht 61.0 in | Wt 151.8 lb

## 2024-02-09 DIAGNOSIS — J0140 Acute pansinusitis, unspecified: Secondary | ICD-10-CM

## 2024-02-09 DIAGNOSIS — L309 Dermatitis, unspecified: Secondary | ICD-10-CM | POA: Diagnosis not present

## 2024-02-09 MED ORDER — AMOXICILLIN 875 MG PO TABS: 875.0000 mg | ORAL_TABLET | Freq: Two times a day (BID) | ORAL | 0 refills | Status: AC

## 2024-02-09 MED ORDER — TRIAMCINOLONE ACETONIDE 0.1 % EX CREA: 1.0000 | TOPICAL_CREAM | Freq: Two times a day (BID) | CUTANEOUS | 0 refills | Status: AC

## 2024-02-09 NOTE — Progress Notes (Signed)
 "  Acute Office Visit  Subjective:     Patient ID: Maria Moss, female    DOB: 30-Dec-1950, 74 y.o.   MRN: [id number]  Chief Complaint  Patient presents with   Rash   Cough    HPI  History of Present Illness   Maria Moss is a 74 year old female who presents with worsening upper respiratory symptoms and a rash.  Upper respiratory symptoms - Congestion for approximately ten days, progressing to rhinorrhea - Infrequent cough with occasional production of small amounts of clear/white colored sputum - Sinus pressure localized around the eyes and forehead - No nausea, vomiting, diarrhea, or fever - Presence of chills - Symptoms worsened after switching from generic Robitussin to a daytime medication - Increase in sneezing and rhinorrhea after medication change - Husband had an upper respiratory infection prior to onset of symptoms  Cutaneous findings - Intermittent rash present for several months - Rash appears in various locations, including forehead and arms - Lesions described as itchy, dry, and scaly - Rash particularly severe in the elbow creases - No significant itchiness, but rash is bothersome       ROS As per HPI.      Objective:    BP 125/82   Pulse 96   Temp 98.3 F (36.8 C) (Oral)   Ht 5' 1 (1.549 m)   Wt 151 lb 12.8 oz (68.9 kg)   SpO2 98%   BMI 28.68 kg/m    Physical Exam Vitals and nursing note reviewed.  Constitutional:      General: She is not in acute distress.    Appearance: She is not ill-appearing, toxic-appearing or diaphoretic.  HENT:     Right Ear: Tympanic membrane, ear canal and external ear normal.     Left Ear: Tympanic membrane, ear canal and external ear normal.     Nose: Congestion present.     Right Sinus: Maxillary sinus tenderness and frontal sinus tenderness present.     Left Sinus: Maxillary sinus tenderness and frontal sinus tenderness present.     Mouth/Throat:     Mouth: Mucous membranes are moist.     Pharynx:  Oropharynx is clear. No oropharyngeal exudate or posterior oropharyngeal erythema.     Tonsils: No tonsillar exudate or tonsillar abscesses. 1+ on the right. 1+ on the left.  Eyes:     General:        Right eye: Discharge (watery) present.        Left eye: Discharge (watery) present.    Conjunctiva/sclera: Conjunctivae normal.  Cardiovascular:     Rate and Rhythm: Regular rhythm.     Heart sounds: Normal heart sounds. No murmur heard. Pulmonary:     Effort: Pulmonary effort is normal. No respiratory distress.     Breath sounds: Normal breath sounds. No wheezing, rhonchi or rales.  Musculoskeletal:     Cervical back: Neck supple.     Right lower leg: No edema.     Left lower leg: No edema.  Lymphadenopathy:     Cervical: Cervical adenopathy present.  Skin:    General: Skin is warm and dry.     Findings: Rash (eczema present to left lower leg, left forearm and elbow crease) present.  Neurological:     General: No focal deficit present.     Mental Status: She is alert and oriented to person, place, and time.  Psychiatric:        Mood and Affect: Mood normal.  Behavior: Behavior normal.     No results found for any visits on 02/09/24.      Assessment & Plan:   Maria Moss was seen today for rash and cough.  Diagnoses and all orders for this visit:  Acute non-recurrent pansinusitis -     amoxicillin  (AMOXIL ) 875 MG tablet; Take 1 tablet (875 mg total) by mouth 2 (two) times daily for 7 days.  Eczema, unspecified type -     triamcinolone  cream (KENALOG ) 0.1 %; Apply 1 Application topically 2 (two) times daily.   Assessment and Plan    Acute pansinusitis Will treat with abx due to duration of symptoms without improvement.  - Prescribed amoxicillin  twice a day for ten days.  Eczema Discussed chronic in nature. Not well controlled.  - Prescribed steroid cream for use twice a day during flares. - Advised against using steroid cream on the face. - Recommended  over-the-counter lotion for eczema to maintain skin hydration.      Return to office for new or worsening symptoms, or if symptoms persist.   The patient indicates understanding of these issues and agrees with the plan.  Maria CHRISTELLA Search, FNP   "

## 2024-02-21 ENCOUNTER — Ambulatory Visit

## 2024-03-09 ENCOUNTER — Ambulatory Visit: Payer: Medicare HMO | Admitting: Internal Medicine

## 2024-03-14 ENCOUNTER — Inpatient Hospital Stay

## 2024-03-16 ENCOUNTER — Ambulatory Visit: Admitting: Family Medicine

## 2024-03-21 ENCOUNTER — Inpatient Hospital Stay: Admitting: Oncology
